# Patient Record
Sex: Male | Born: 1965 | Race: White | Hispanic: No | Marital: Single | Smoking: Light tobacco smoker
Health system: Southern US, Community
[De-identification: ages and names within clinical notes are randomized; demographics above are authoritative.]

## PROBLEM LIST (undated history)

## (undated) DIAGNOSIS — E119 Type 2 diabetes mellitus without complications: Secondary | ICD-10-CM

## (undated) DIAGNOSIS — I10 Essential (primary) hypertension: Secondary | ICD-10-CM

## (undated) NOTE — *Deleted (*Deleted)
[facility]  CONSULT NOTE    Date: 07/11/2020                  Patient Name:  [name]  MRN: [id number]  DOB: 1966-03-19  Age / Sex: 70 y.o., male         PCP: Dione Housekeeper, MD                 Service Requesting Consult: Dr. Margo Aye                 Reason for Consult: Hypercalcemia            History of Present Illness: Mr. [name] was found to have a right first toe wound. Was being treated as outpatient with dicloxacillin. Confirmed by radiology to be osteomyelitis. Started on broad spectrum antibiotics and admitted to [facility]. Nephrology was consulted for hypercalcemia   Medications: Outpatient medications: Medications Prior to Admission  Medication Sig Dispense Refill Last Dose  . atorvastatin (LIPITOR) 40 MG tablet Take 40 mg by mouth daily.   07/09/2020 at 0600  . dicloxacillin (DYNAPEN) 500 MG capsule Take 1 capsule by mouth every 6 (six) hours. For 10 days   07/09/2020 at 01800  . GLIPIZIDE XL 10 MG 24 hr tablet Take 2 tablets by mouth daily.   11 07/09/2020 at 0600  . losartan (COZAAR) 50 MG tablet Take 50 mg by mouth daily.   07/09/2020 at 0600  . amLODipine (NORVASC) 10 MG tablet Take 1 tablet (10 mg total) by mouth daily. (Patient not taking: Reported on 07/09/2020) 30 tablet 0 Not Taking at Unknown time  . amoxicillin-clavulanate (AUGMENTIN) 875-125 MG tablet Take 1 tablet by mouth 2 (two) times daily. (Patient not taking: Reported on 07/09/2020) 28 tablet 0 Not Taking at Unknown time  . ibuprofen (ADVIL,MOTRIN) 200 MG tablet Take 200-400 mg by mouth every 6 (six) hours as needed. (Patient not taking: Reported on 07/09/2020)   Not Taking at Unknown time  . lisinopril (PRINIVIL,ZESTRIL) 20 MG tablet Take 1 tablet (20 mg total) by mouth daily. (Patient not taking: Reported on 07/09/2020) 30 tablet 0 Not Taking at Unknown time  . omega-3 acid ethyl esters (LOVAZA) 1 g capsule Take 1 g by mouth every other day. (Patient not taking: Reported  on 07/09/2020)   Not Taking at Unknown time  . oxyCODONE (OXY IR/ROXICODONE) 5 MG immediate release tablet Take 1 tablet (5 mg total) by mouth every 4 (four) hours as needed for moderate pain. (Patient not taking: Reported on 07/09/2020) 30 tablet 0 Not Taking at Unknown time    Current medications: Current Facility-Administered Medications  Medication Dose Route Frequency Provider Last Rate Last Admin  . 0.9 %  sodium chloride infusion   Intravenous Continuous Dow Adolph N, DO 100 mL/hr at 07/11/20 1454 Continued from Pre-op at 07/11/20 1454  . [MAR Hold] amLODipine (NORVASC) tablet 10 mg  10 mg Oral Daily Esaw Grandchild A, DO   10 mg at 07/10/20 1702  . [MAR Hold] atorvastatin (LIPITOR) tablet 40 mg  40 mg Oral q1800 Tu, Ching T, DO   40 mg at 07/10/20 1702  . [MAR Hold] cefTRIAXone (ROCEPHIN) 2 g in sodium chloride 0.9 % 100 mL IVPB  2 g Intravenous Q24H Marty Heck, RPH 200 mL/hr at 07/11/20 0900 2 g at 07/11/20 0900  . [MAR Hold] Chlorhexidine Gluconate Cloth 2 % PADS 6 each  6 each Topical Daily Esaw Grandchild A, DO   6  each at 07/11/20 0901  . influenza vac split quadrivalent PF (FLUARIX) injection 0.5 mL  0.5 mL Intramuscular Tomorrow-1000 Pennie Banter, DO      . [MAR Hold] insulin aspart (novoLOG) injection 0-5 Units  0-5 Units Subcutaneous QHS Tu, Ching T, DO   2 Units at 07/09/20 2329  . [MAR Hold] insulin aspart (novoLOG) injection 0-9 Units  0-9 Units Subcutaneous TID WC Tu, Ching T, DO   2 Units at 07/11/20 1220  . [MAR Hold] losartan (COZAAR) tablet 50 mg  50 mg Oral Daily Tu, Ching T, DO   50 mg at 07/10/20 1009  . [MAR Hold] oxyCODONE (Oxy IR/ROXICODONE) immediate release tablet 5 mg  5 mg Oral Q4H PRN Esaw Grandchild A, DO   5 mg at 07/10/20 0813  . povidone-iodine 10 % swab 2 application  2 application Topical Once Gwyneth Revels, DPM      . [MAR Hold] vancomycin (VANCOCIN) IVPB 1000 mg/200 mL premix  1,000 mg Intravenous Q12H Valrie Hart A, RPH 200 mL/hr at 07/11/20  0954 1,000 mg at 07/11/20 4540      Allergies: No Known Allergies    Past Medical History: Past Medical History:  Diagnosis Date  . Diabetes (HCC)   . Hypertension      Past Surgical History: Past Surgical History:  Procedure Laterality Date  . IRRIGATION AND DEBRIDEMENT ABSCESS Right 10/31/2018   Procedure: IRRIGATION AND DEBRIDEMENT ABSCESS;  Surgeon: Gwyneth Revels, DPM;  Location: [facility] ORS;  Service: Podiatry;  Laterality: Right;  . IRRIGATION AND DEBRIDEMENT FOOT Right 11/02/2018   Procedure: IRRIGATION AND DEBRIDEMENT FOOT;  Surgeon: Linus Galas, DPM;  Location: [facility] ORS;  Service: Podiatry;  Laterality: Right;     Family History: Family History  Problem Relation Age of Onset  . CAD Father   . CAD Brother      Social History: Social History   Socioeconomic History  . Marital status: Single    Spouse name: Not on file  . Number of children: Not on file  . Years of education: Not on file  . Highest education level: Not on file  Occupational History  . Not on file  Tobacco Use  . Smoking status: Light Tobacco Smoker    Types: Cigars  . Smokeless tobacco: Never Used  Vaping Use  . Vaping Use: Never used  Substance and Sexual Activity  . Alcohol use: Yes    Comment: "too much"   . Drug use: Never  . Sexual activity: Yes  Other Topics Concern  . Not on file  Social History Narrative  . Not on file   Social Determinants of Health   Financial Resource Strain:   . Difficulty of Paying Living Expenses: Not on file  Food Insecurity:   . Worried About Programme researcher, broadcasting/film/video in the Last Year: Not on file  . Ran Out of Food in the Last Year: Not on file  Transportation Needs:   . Lack of Transportation (Medical): Not on file  . Lack of Transportation (Non-Medical): Not on file  Physical Activity:   . Days of Exercise per Week: Not on file  . Minutes of Exercise per Session: Not on file  Stress:   . Feeling of Stress : Not on file  Social Connections:   .  Frequency of Communication with Friends and Family: Not on file  . Frequency of Social Gatherings with Friends and Family: Not on file  . Attends Religious Services: Not on file  . Active Member  of Clubs or Organizations: Not on file  . Attends Banker Meetings: Not on file  . Marital Status: Not on file  Intimate Partner Violence:   . Fear of Current or Ex-Partner: Not on file  . Emotionally Abused: Not on file  . Physically Abused: Not on file  . Sexually Abused: Not on file     Review of Systems: ROS  Vital Signs: Blood pressure (!) 162/93, pulse 74, temperature 98.5 F (36.9 C), temperature source Oral, resp. rate 16, height 6\' 3"  (1.905 m), weight 111.1 kg, SpO2 100 %.  Weight trends: Filed Weights   07/09/20 1248  Weight: 111.1 kg    Physical Exam: General: NAD,   Head: Normocephalic, atraumatic. Moist oral mucosal membranes  Eyes: Anicteric, PERRL  Neck: Supple, trachea midline  Lungs:  Clear to auscultation  Heart: Regular rate and rhythm  Abdomen:  Soft, nontender,   Extremities:  *** peripheral edema.  Neurologic: Nonfocal, moving all four extremities  Skin: No lesions  Access: ***     Lab results: Basic Metabolic Panel: Recent Labs  Lab 07/09/20 1258 07/10/20 0534 07/11/20 0524  NA 137 134* 133*  K 4.0 4.1 5.0  CL 103 101 100  CO2 25 25 27   GLUCOSE 182* 141* 231*  BUN 10 9 10   CREATININE 0.73 0.71 0.84  CALCIUM 11.1* 11.4* 11.8*    Liver Function Tests: Recent Labs  Lab 07/09/20 1258  AST 23  ALT 23  ALKPHOS 96  BILITOT 1.1  PROT 7.5  ALBUMIN 3.8   No results for input(s): LIPASE, AMYLASE in the last 168 hours. No results for input(s): AMMONIA in the last 168 hours.  CBC: Recent Labs  Lab 07/09/20 1258 07/10/20 0534  WBC 6.1 5.6  NEUTROABS 4.4  --   HGB 14.8 14.7  HCT 43.1 43.6  MCV 88.1 86.9  PLT 257 226    Cardiac Enzymes: No results for input(s): CKTOTAL, CKMB, CKMBINDEX, TROPONINI in the last 168 hours.   BNP: Invalid input(s): POCBNP  CBG: Recent Labs  Lab 07/10/20 1325 07/10/20 1631 07/10/20 2135 07/11/20 0724 07/11/20 1140  GLUCAP 261* 166* 187* 212* 184*    Microbiology: Results for orders placed or performed during the hospital encounter of 07/09/20  Respiratory Panel by RT PCR (Flu A&B, Covid) - Nasopharyngeal Swab     Status: None   Collection Time: 07/09/20  9:28 PM   Specimen: Nasopharyngeal Swab  Result Value Ref Range Status   SARS Coronavirus 2 by RT PCR NEGATIVE NEGATIVE Final    Comment: (NOTE) SARS-CoV-2 target nucleic acids are NOT DETECTED.  The SARS-CoV-2 RNA is generally detectable in upper respiratoy specimens during the acute phase of infection. The lowest concentration of SARS-CoV-2 viral copies this assay can detect is 131 copies/mL. A negative result does not preclude SARS-Cov-2 infection and should not be used as the sole basis for treatment or other patient management decisions. A negative result may occur with  improper specimen collection/handling, submission of specimen other than nasopharyngeal swab, presence of viral mutation(s) within the areas targeted by this assay, and inadequate number of viral copies (<131 copies/mL). A negative result must be combined with clinical observations, patient history, and epidemiological information. The expected result is Negative.  Fact Sheet for Patients:  https://www.moore.com/  Fact Sheet for Healthcare Providers:  https://www.young.biz/  This test is no t yet approved or cleared by the [place] FDA and  has been authorized for detection and/or diagnosis of SARS-CoV-2 by FDA under  an Emergency Use Authorization (EUA). This EUA will remain  in effect (meaning this test can be used) for the duration of the COVID-19 declaration under Section 564(b)(1) of the Act, 21 U.S.C. section 360bbb-3(b)(1), unless the authorization is terminated or revoked sooner.      Influenza A by PCR NEGATIVE NEGATIVE Final   Influenza B by PCR NEGATIVE NEGATIVE Final    Comment: (NOTE) The Xpert Xpress SARS-CoV-2/FLU/RSV assay is intended as an aid in  the diagnosis of influenza from Nasopharyngeal swab specimens and  should not be used as a sole basis for treatment. Nasal washings and  aspirates are unacceptable for Xpert Xpress SARS-CoV-2/FLU/RSV  testing.  Fact Sheet for Patients: https://www.moore.com/  Fact Sheet for Healthcare Providers: https://www.young.biz/  This test is not yet approved or cleared by the [place] FDA and  has been authorized for detection and/or diagnosis of SARS-CoV-2 by  FDA under an Emergency Use Authorization (EUA). This EUA will remain  in effect (meaning this test can be used) for the duration of the  Covid-19 declaration under Section 564(b)(1) of the Act, 21  U.S.C. section 360bbb-3(b)(1), unless the authorization is  terminated or revoked. Performed at [facility], [address], [place], [place] [address]   Surgical pcr screen     Status: None   Collection Time: 07/10/20  4:20 PM   Specimen: Nasal Mucosa; Nasal Swab  Result Value Ref Range Status   MRSA, PCR NEGATIVE NEGATIVE Final   Staphylococcus aureus NEGATIVE NEGATIVE Final    Comment: (NOTE) The Xpert SA Assay (FDA approved for NASAL specimens in patients 75 years of age and older), is one component of a comprehensive surveillance program. It is not intended to diagnose infection nor to guide or monitor treatment. Performed at [facility], [address] [place], [place] [address]     Coagulation Studies: No results for input(s): LABPROT, INR in the last 72 hours.  Urinalysis: Recent Labs    07/09/20 2155  COLORURINE YELLOW*  LABSPEC 1.017  PHURINE 6.0  GLUCOSEU 150*  HGBUR SMALL*  BILIRUBINUR NEGATIVE  KETONESUR NEGATIVE  PROTEINUR NEGATIVE  NITRITE NEGATIVE  LEUKOCYTESUR  NEGATIVE      Imaging: MR FOOT RIGHT W WO CONTRAST  Result Date: 07/10/2020 CLINICAL DATA:  Skin ulceration on the plantar surface of the right great toe in a diabetic patient. Question osteomyelitis. EXAM: MRI OF THE RIGHT FOREFOOT WITHOUT AND WITH CONTRAST TECHNIQUE: Multiplanar, multisequence MR imaging of the right forefoot was performed before and after the administration of intravenous contrast. CONTRAST:  10 mL GADAVIST IV SOLN COMPARISON:  Plain films right great toe 07/05/2020. FINDINGS: Bones/Joint/Cartilage There is intense marrow edema and enhancement throughout the great toe consistent with osteomyelitis. Bony destructive change is seen about the IP joint and there is a small IP joint effusion. No other evidence of osteomyelitis is identified. No other evidence of osteomyelitis or septic joint is identified. The head of the proximal phalanx of the great toe appears exposed to air. 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 There is also midfoot osteoarthritis which appears mild-to-moderate in degree and worst at the second tarsometatarsal joint. Ligaments Intact. Muscles and Tendons There is atrophy of intrinsic musculature the foot. No intramuscular fluid collection. No tendon tear. Soft tissues Bandaging is present about the great toe. There is soft tissue edema and enhancement in the great toe consistent with cellulitis. IMPRESSION: Osteomyelitis throughout the great toe with bony destructive change about the IP joint compatible  with septic joint. Negative for myositis or abscess. First MTP and midfoot osteoarthritis. Electronically Signed   By: Drusilla Kanner M.D.   On: 07/10/2020 07:12   DG MINI C-ARM IMAGE ONLY  Result Date: 07/11/2020 There is no interpretation for this exam.  This order is for images obtained during a surgical procedure.  Please See "Surgeries" Tab for more information regarding the procedure.      Assessment & Plan: Mr. Duvan Mousel is a 24 y.o. white male with hypertension, diabetes mellitus type II, diabetic neuropathy, hyperlipidemia, peripheral vascular disease, history of amputation of right fifth toe who was admitted to [facility] on 07/09/2020 for Osteomyelitis [facility]) [M86.9] Osteomyelitis of right foot, unspecified type [facility]) [M86.9]  Nephrology consulted for hypercalcemia  1. Hypercalcemia: long standing since 2019. Elevated PTH at 80 with normal kidney function.  Differential includes primary hyperparathyroidism, multiple myeloma or other malignancy, milk-alkali syndrome,  granulomatous disease, or vitamin D intoxication.  However with elevated PTH, most likely primary hyperparathyroidism.  - Check phos, SPEP/UPEP, vitamin D level.  - Patient will need outpatient work up.   2. Hypertension: elevated. Home regimen of losartan, amlodipine, and lisinopril. Patient should not be on both an ACE-I and an ARB at the same time.  - Agree with continuation of amlodipine and losartan.   3. 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 Hemoglobin AA1c of 6.7% on admission.     LOS: 2 Sarath Kolluru 10/13/20212:55 PM

---

## 2017-07-26 ENCOUNTER — Emergency Department: Payer: BC Managed Care – PPO

## 2017-07-26 ENCOUNTER — Emergency Department
Admission: EM | Admit: 2017-07-26 | Discharge: 2017-07-26 | Disposition: A | Discharge status: Home / Self Care | Payer: BC Managed Care – PPO | Attending: Emergency Medicine | Admitting: Emergency Medicine

## 2017-07-26 DIAGNOSIS — Y999 Unspecified external cause status: Secondary | ICD-10-CM | POA: Diagnosis not present

## 2017-07-26 DIAGNOSIS — S161XXA Strain of muscle, fascia and tendon at neck level, initial encounter: Secondary | ICD-10-CM

## 2017-07-26 DIAGNOSIS — Y939 Activity, unspecified: Secondary | ICD-10-CM | POA: Diagnosis not present

## 2017-07-26 DIAGNOSIS — Y929 Unspecified place or not applicable: Secondary | ICD-10-CM | POA: Diagnosis not present

## 2017-07-26 DIAGNOSIS — M62838 Other muscle spasm: Secondary | ICD-10-CM | POA: Diagnosis not present

## 2017-07-26 DIAGNOSIS — S199XXA Unspecified injury of neck, initial encounter: Secondary | ICD-10-CM | POA: Diagnosis present

## 2017-07-26 MED ORDER — KETOROLAC TROMETHAMINE 30 MG/ML IJ SOLN
30.0000 mg | Freq: Once | INTRAMUSCULAR | Status: AC
  Administered 2017-07-26: 30 mg via INTRAMUSCULAR
  Filled 2017-07-26: qty 1

## 2017-07-26 MED ORDER — CYCLOBENZAPRINE HCL 10 MG PO TABS: 10.0000 mg | ORAL_TABLET | Freq: Three times a day (TID) | ORAL | 0 refills | Status: DC | PRN

## 2017-07-26 MED ORDER — CYCLOBENZAPRINE HCL 10 MG PO TABS
5.0000 mg | ORAL_TABLET | Freq: Once | ORAL | Status: AC
  Administered 2017-07-26: 5 mg via ORAL
  Filled 2017-07-26: qty 1

## 2017-07-26 MED ORDER — KETOROLAC TROMETHAMINE 10 MG PO TABS: 10.0000 mg | ORAL_TABLET | Freq: Four times a day (QID) | ORAL | 0 refills | Status: AC | PRN
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
## 2017-07-26 NOTE — Discharge Instructions (Signed)
Take medication as prescribed.   X-rays taken were negative for acute fracture (broken bones).   Return to emergency department if symptoms significantly worsen or follow-up with PCP as needed.

## 2018-01-19 ENCOUNTER — Other Ambulatory Visit: Payer: Self-pay | Admitting: Pediatrics

## 2018-01-19 DIAGNOSIS — R030 Elevated blood-pressure reading, without diagnosis of hypertension: Secondary | ICD-10-CM

## 2018-01-19 DIAGNOSIS — Z8249 Family history of ischemic heart disease and other diseases of the circulatory system: Secondary | ICD-10-CM

## 2018-02-09 ENCOUNTER — Other Ambulatory Visit: Payer: Self-pay | Admitting: Pediatrics

## 2018-02-12 ENCOUNTER — Other Ambulatory Visit: Payer: Self-pay | Admitting: Pediatrics

## 2018-02-12 DIAGNOSIS — R748 Abnormal levels of other serum enzymes: Secondary | ICD-10-CM

## 2018-02-12 DIAGNOSIS — R030 Elevated blood-pressure reading, without diagnosis of hypertension: Secondary | ICD-10-CM

## 2018-02-12 DIAGNOSIS — Z8249 Family history of ischemic heart disease and other diseases of the circulatory system: Secondary | ICD-10-CM

## 2018-02-15 ENCOUNTER — Other Ambulatory Visit: Payer: Self-pay | Admitting: Pediatrics

## 2018-02-15 DIAGNOSIS — R748 Abnormal levels of other serum enzymes: Secondary | ICD-10-CM

## 2018-02-15 DIAGNOSIS — R030 Elevated blood-pressure reading, without diagnosis of hypertension: Secondary | ICD-10-CM

## 2018-02-15 DIAGNOSIS — Z8249 Family history of ischemic heart disease and other diseases of the circulatory system: Secondary | ICD-10-CM

## 2018-02-16 ENCOUNTER — Ambulatory Visit: Payer: BC Managed Care – PPO

## 2018-02-16 ENCOUNTER — Ambulatory Visit
Admission: RE | Admit: 2018-02-16 | Discharge: 2018-02-16 | Disposition: A | Discharge status: Home / Self Care | Payer: BC Managed Care – PPO | Source: Ambulatory Visit | Attending: Pediatrics | Admitting: Pediatrics

## 2018-02-16 ENCOUNTER — Encounter (INDEPENDENT_AMBULATORY_CARE_PROVIDER_SITE_OTHER): Payer: Self-pay

## 2018-02-16 DIAGNOSIS — R748 Abnormal levels of other serum enzymes: Secondary | ICD-10-CM

## 2018-02-16 DIAGNOSIS — R030 Elevated blood-pressure reading, without diagnosis of hypertension: Secondary | ICD-10-CM | POA: Insufficient documentation

## 2018-02-16 DIAGNOSIS — Z8249 Family history of ischemic heart disease and other diseases of the circulatory system: Secondary | ICD-10-CM | POA: Insufficient documentation

## 2018-04-13 ENCOUNTER — Other Ambulatory Visit: Payer: Self-pay

## 2018-04-13 ENCOUNTER — Encounter: Payer: Self-pay | Admitting: Emergency Medicine

## 2018-04-13 ENCOUNTER — Observation Stay
Admission: EM | Admit: 2018-04-13 | Discharge: 2018-04-14 | Disposition: A | Discharge status: Home / Self Care | Payer: BC Managed Care – PPO | Attending: Internal Medicine | Admitting: Internal Medicine

## 2018-04-13 DIAGNOSIS — Z7984 Long term (current) use of oral hypoglycemic drugs: Secondary | ICD-10-CM | POA: Insufficient documentation

## 2018-04-13 DIAGNOSIS — Z8249 Family history of ischemic heart disease and other diseases of the circulatory system: Secondary | ICD-10-CM | POA: Insufficient documentation

## 2018-04-13 DIAGNOSIS — Y93K9 Activity, other involving animal care: Secondary | ICD-10-CM | POA: Insufficient documentation

## 2018-04-13 DIAGNOSIS — L03113 Cellulitis of right upper limb: Principal | ICD-10-CM | POA: Insufficient documentation

## 2018-04-13 DIAGNOSIS — F17290 Nicotine dependence, other tobacco product, uncomplicated: Secondary | ICD-10-CM | POA: Insufficient documentation

## 2018-04-13 DIAGNOSIS — Y998 Other external cause status: Secondary | ICD-10-CM | POA: Insufficient documentation

## 2018-04-13 DIAGNOSIS — Z79899 Other long term (current) drug therapy: Secondary | ICD-10-CM | POA: Diagnosis not present

## 2018-04-13 DIAGNOSIS — W540XXA Bitten by dog, initial encounter: Secondary | ICD-10-CM | POA: Diagnosis not present

## 2018-04-13 DIAGNOSIS — Y92009 Unspecified place in unspecified non-institutional (private) residence as the place of occurrence of the external cause: Secondary | ICD-10-CM | POA: Insufficient documentation

## 2018-04-13 DIAGNOSIS — I10 Essential (primary) hypertension: Secondary | ICD-10-CM | POA: Insufficient documentation

## 2018-04-13 DIAGNOSIS — E119 Type 2 diabetes mellitus without complications: Secondary | ICD-10-CM | POA: Insufficient documentation

## 2018-04-13 HISTORY — DX: Essential (primary) hypertension: I10

## 2018-04-13 HISTORY — DX: Type 2 diabetes mellitus without complications: E11.9

## 2018-04-13 LAB — CBC WITH DIFFERENTIAL/PLATELET
Basophils Absolute: 0 10*3/uL (ref 0–0.1)
Basophils Relative: 0 %
EOS ABS: 0 10*3/uL (ref 0–0.7)
Eosinophils Relative: 0 %
HEMATOCRIT: 47.9 % (ref 40.0–52.0)
HEMOGLOBIN: 16.9 g/dL (ref 13.0–18.0)
LYMPHS ABS: 1.3 10*3/uL (ref 1.0–3.6)
Lymphocytes Relative: 13 %
MCH: 32.9 pg (ref 26.0–34.0)
MCHC: 35.4 g/dL (ref 32.0–36.0)
MCV: 92.9 fL (ref 80.0–100.0)
Monocytes Absolute: 1.3 10*3/uL — ABNORMAL HIGH (ref 0.2–1.0)
Monocytes Relative: 13 %
NEUTROS ABS: 7.9 10*3/uL — AB (ref 1.4–6.5)
NEUTROS PCT: 74 %
Platelets: 144 10*3/uL — ABNORMAL LOW (ref 150–440)
RBC: 5.15 MIL/uL (ref 4.40–5.90)
RDW: 13.1 % (ref 11.5–14.5)
WBC: 10.6 10*3/uL (ref 3.8–10.6)

## 2018-04-13 LAB — BASIC METABOLIC PANEL
ANION GAP: 8 (ref 5–15)
BUN: 12 mg/dL (ref 6–20)
CHLORIDE: 102 mmol/L (ref 98–111)
CO2: 26 mmol/L (ref 22–32)
Calcium: 11.2 mg/dL — ABNORMAL HIGH (ref 8.9–10.3)
Creatinine, Ser: 0.61 mg/dL (ref 0.61–1.24)
GFR calc Af Amer: >60 mL/min (ref >60)
GLUCOSE: 168 mg/dL — AB (ref 70–99)
POTASSIUM: 3.9 mmol/L (ref 3.5–5.1)
Sodium: 136 mmol/L (ref 135–145)

## 2018-04-13 LAB — GLUCOSE, CAPILLARY
Glucose-Capillary: 158 mg/dL — ABNORMAL HIGH (ref 70–99)
Glucose-Capillary: 148 mg/dL — ABNORMAL HIGH (ref 70–99)

## 2018-04-13 MED ORDER — ENOXAPARIN SODIUM 40 MG/0.4ML ~~LOC~~ SOLN
40.0000 mg | SUBCUTANEOUS | Status: DC
  Administered 2018-04-13: 40 mg via SUBCUTANEOUS
  Filled 2018-04-13: qty 0.4

## 2018-04-13 MED ORDER — ONDANSETRON HCL 4 MG PO TABS: 4.0000 mg | ORAL_TABLET | Freq: Four times a day (QID) | ORAL | Status: DC | PRN

## 2018-04-13 MED ORDER — SODIUM CHLORIDE 0.9 % IV SOLN
3.0000 g | Freq: Four times a day (QID) | INTRAVENOUS | Status: DC
  Administered 2018-04-13 – 2018-04-14 (×3): 3 g via INTRAVENOUS
  Filled 2018-04-13 (×5): qty 3

## 2018-04-13 MED ORDER — OXYCODONE-ACETAMINOPHEN 5-325 MG PO TABS
1.0000 | ORAL_TABLET | Freq: Four times a day (QID) | ORAL | Status: DC | PRN
  Administered 2018-04-13 – 2018-04-14 (×3): 1 via ORAL
  Filled 2018-04-13 (×3): qty 1

## 2018-04-13 MED ORDER — IBUPROFEN 400 MG PO TABS: 400.0000 mg | ORAL_TABLET | Freq: Four times a day (QID) | ORAL | Status: DC | PRN

## 2018-04-13 MED ORDER — MORPHINE SULFATE (PF) 4 MG/ML IV SOLN
4.0000 mg | Freq: Once | INTRAVENOUS | Status: AC
  Administered 2018-04-13: 4 mg via INTRAVENOUS
  Filled 2018-04-13: qty 1

## 2018-04-13 MED ORDER — ACETAMINOPHEN 650 MG RE SUPP: 650.0000 mg | Freq: Four times a day (QID) | RECTAL | Status: DC | PRN

## 2018-04-13 MED ORDER — ONDANSETRON HCL 4 MG/2ML IJ SOLN
4.0000 mg | Freq: Once | INTRAMUSCULAR | Status: AC
  Administered 2018-04-13: 4 mg via INTRAVENOUS
  Filled 2018-04-13: qty 2

## 2018-04-13 MED ORDER — ACETAMINOPHEN 325 MG PO TABS: 650.0000 mg | ORAL_TABLET | Freq: Four times a day (QID) | ORAL | Status: DC | PRN

## 2018-04-13 MED ORDER — POLYETHYLENE GLYCOL 3350 17 G PO PACK: 17.0000 g | PACK | Freq: Every day | ORAL | Status: DC | PRN

## 2018-04-13 MED ORDER — AMPICILLIN-SULBACTAM SODIUM 3 (2-1) G IJ SOLR
3.0000 g | Freq: Once | INTRAMUSCULAR | Status: AC
  Administered 2018-04-13: 3 g via INTRAVENOUS
  Filled 2018-04-13: qty 3

## 2018-04-13 MED ORDER — INSULIN ASPART 100 UNIT/ML ~~LOC~~ SOLN
0.0000 [IU] | Freq: Every day | SUBCUTANEOUS | Status: DC
  Administered 2018-04-14: 2 [IU] via SUBCUTANEOUS

## 2018-04-13 MED ORDER — INSULIN ASPART 100 UNIT/ML ~~LOC~~ SOLN
0.0000 [IU] | Freq: Three times a day (TID) | SUBCUTANEOUS | Status: DC
  Filled 2018-04-13: qty 1

## 2018-04-13 MED ORDER — HYDRALAZINE HCL 20 MG/ML IJ SOLN: 10.0000 mg | Freq: Four times a day (QID) | INTRAMUSCULAR | Status: DC | PRN

## 2018-04-13 MED ORDER — ONDANSETRON HCL 4 MG/2ML IJ SOLN: 4.0000 mg | Freq: Four times a day (QID) | INTRAMUSCULAR | Status: DC | PRN

## 2018-04-13 NOTE — ED Notes (Signed)
1 set blood cultures drawn with Kurin PIV set with IV start d/t infection in right hand s/p dog bite. Pt prescribed antibiotics but no blood cultures ordered at this time. This RN asked EDP if he wanted them, declined at this time. Set sent with save label prior to antibiotic administration. 1531 was time of collection. 18g left forearm site.

## 2018-04-13 NOTE — ED Triage Notes (Signed)
Pt reports that he moved his dogs food bowl while he was eating and the dog bit his right hand. Right hand red and swollen.

## 2018-04-13 NOTE — Progress Notes (Signed)
Pharmacy Antibiotic Note  Westin Knotts is a 52 y.o. male admitted on 04/13/2018 with cellulitis following a dog bite.  Pharmacy has been consulted for Unaysn dosing.  Plan: Unasyn 3g IV q6h  Height: 6\' 3"  (190.5 cm) Weight: 220 lb (99.8 kg) IBW/kg (Calculated) : 84.5  Temp (24hrs), Avg:99.5 F (37.5 C), Min:98.8 F (37.1 C), Max:100.2 F (37.9 C)  Recent Labs  Lab 04/13/18 1531  WBC 10.6  CREATININE 0.61    Estimated Creatinine Clearance: 129.1 mL/min (by C-G formula based on SCr of 0.61 mg/dL).    No Known Allergies  Antimicrobials this admission: Unasyn 7/16 >>   Thank you for allowing pharmacy to be a part of this patient's care.  Paulina Fusi, PharmD, BCPS 04/13/2018 6:18 PM

## 2018-04-13 NOTE — ED Notes (Signed)
ED Provider at bedside. 

## 2018-04-13 NOTE — ED Provider Notes (Signed)
[facility] Emergency Department Provider Note ___________________________________________   First MD Initiated Contact with Patient 04/13/18 1430     (approximate)  I have reviewed the triage vital signs and the nursing notes.   HISTORY  Chief Complaint Animal Bite  HPI [name] is a 52 y.o. male with a history of hypertension as well as diabetes was presenting to the emergency department with right upper extremity swelling and pain after his dog bit him yesterday morning.  He says that his dog is up-to-date with his vaccinations and was acting normally.  The patient says that he was moving the dog's food and the dog is known to be territorial with his food.  The dog bit his right hand at that time and the patient says that the right hand as well as right upper extremity have swollen greatly since 3 AM.  He denies fever.  Says that his tetanus shot was updated this past April.  Dog is been acting normally since the incident.  Patient states that since the swelling started at 3 AM his hand is swollen and he has streaking redness up his right upper extremity.   Past Medical History:  Diagnosis Date  . Hypertension     There are no active problems to display for this patient.   History reviewed. No pertinent surgical history.  Prior to Admission medications   Medication Sig Start Date End Date Taking? Authorizing Provider  GLIPIZIDE XL 5 MG 24 hr tablet Take 1 tablet by mouth daily. 02/08/18  Yes [provider]  lisinopril (PRINIVIL,ZESTRIL) 10 MG tablet Take 1 tablet by mouth daily. 02/08/18  Yes [provider]  cyclobenzaprine (FLEXERIL) 10 MG tablet Take 1 tablet (10 mg total) by mouth 3 (three) times daily as needed for muscle spasms. Patient not taking: Reported on 04/13/2018 07/26/17   Little, Traci M, PA-C    Allergies Patient has no known allergies.  No family history on file.  Social History Social History   Tobacco Use    . Smoking status: Light Tobacco Smoker    Types: Cigars  Substance Use Topics  . Alcohol use: Yes  . Drug use: Never    Review of Systems  Constitutional: No fever/chills Eyes: No visual changes. ENT: No sore throat. Cardiovascular: Denies chest pain. Respiratory: Denies shortness of breath. Gastrointestinal: No abdominal pain.  No nausea, no vomiting.  No diarrhea.  No constipation. Genitourinary: Negative for dysuria. Musculoskeletal: Negative for back pain. Skin: As above Neurological: Negative for headaches, focal weakness or numbness.   ____________________________________________   PHYSICAL EXAM:  VITAL SIGNS: ED Triage Vitals [04/13/18 1259]  Enc Vitals Group     BP (!) 178/95     Pulse Rate 100     Resp 20     Temp 98.8 F (37.1 C)     Temp Source Oral     SpO2 98 %     Weight 220 lb (99.8 kg)     Height 6\' 3"  (1.905 m)     Head Circumference      Peak Flow      Pain Score 7     Pain Loc      Pain Edu?      Excl. in [place]?     Constitutional: Alert and oriented. Well appearing and in no acute distress. Eyes: Conjunctivae are normal.  Head: Atraumatic. Nose: No congestion/rhinnorhea. Mouth/Throat: Mucous membranes are moist.  Neck: No stridor.   Cardiovascular: Normal rate, regular rhythm. Grossly  normal heart sounds.  Palpable pulse to the right radial. Respiratory: Normal respiratory effort.  No retractions. Lungs CTAB. Gastrointestinal: Soft and nontender. No distention. No CVA tenderness. Musculoskeletal: No lower extremity tenderness nor edema.  No joint effusions.  Right hand is diffusely swollen.  Patient unable to make a fist because of the pain and swelling obstructing him flexing his fingers.  There are puncture wounds visualized on the dorsum, medial as well as palmar aspects.  No fluctuance.  No bony deformity.  Streaking redness of the right upper extremity all the way to the elbow to the volar and dorsal surface of the right  forearm.  Neurologic:  Normal speech and language. No gross focal neurologic deficits are appreciated. Skin:  Skin is warm, dry and intact. No rash noted. Psychiatric: Mood and affect are normal. Speech and behavior are normal.  ____________________________________________   LABS (all labs ordered are listed, but only abnormal results are displayed)  Labs Reviewed  CBC WITH DIFFERENTIAL/PLATELET - Abnormal; Notable for the following components:      Result Value   Platelets 144 (*)    Neutro Abs 7.9 (*)    Monocytes Absolute 1.3 (*)    All other components within normal limits  BASIC METABOLIC PANEL - Abnormal; Notable for the following components:   Glucose, Bld 168 (*)    Calcium 11.2 (*)    All other components within normal limits   ____________________________________________  EKG   ____________________________________________  RADIOLOGY   ____________________________________________   PROCEDURES  Procedure(s) performed:   Procedures  Critical Care performed:   ____________________________________________   INITIAL IMPRESSION / ASSESSMENT AND PLAN / ED COURSE  Pertinent labs & imaging results that were available during my care of the patient were reviewed by me and considered in my medical decision making (see chart for details).  DDX: Wound infection, animal bite, cellulitis As part of my medical decision making, I reviewed the following data within the electronic MEDICAL RECORD NUMBER Notes from prior ED visits  ----------------------------------------- 4:05 PM on 04/13/2018 -----------------------------------------  Patient with wound infection after animal bite.  Will be admitted to the hospital.  Signed out to Dr. Darvin Neighbours.  Patient low risk for rabies.  Known dog that is been acting normally.  Provoked bite.   ____________________________________________   FINAL CLINICAL IMPRESSION(S) / ED DIAGNOSES  Dog bite.  Right upper extremity  cellulitis.   NEW MEDICATIONS STARTED DURING THIS VISIT:  New Prescriptions   No medications on file     Note:  This document was prepared using Dragon voice recognition software and may include unintentional dictation errors.     Orbie Pyo, MD 04/13/18 [phone]

## 2018-04-13 NOTE — ED Notes (Signed)
Pt reports being up-to-date on tetanus shot (recently, within last month or two). States dog is up-to-date on all shots, but does think he hasn't been to the vet with the dog for 3 years. Three puncture wounds to right hand with a large amount of swelling present in right hand and fingers. Redness tracking up arm, anteriorly and posteriorly. Pain 8/10 tightness and aching at this time.

## 2018-04-13 NOTE — H&P (Signed)
[facility] [facility] [place] NAME: [name]    MR#:  [id number]  DATE OF BIRTH:  [birth year]  DATE OF ADMISSION:  04/13/2018  PRIMARY CARE PHYSICIAN: Derinda Late, MD   REQUESTING/REFERRING PHYSICIAN:   CHIEF COMPLAINT:   Chief Complaint  Patient presents with  . Animal Bite    HISTORY OF PRESENT ILLNESS:  [name]  is a 52 y.o. male with a known history of hypertension, diabetes, hyperlipidemia presents to the hospital after he had a dog bite yesterday evening on his right hand.  Redness quickly spread and is on his forearm.  Presented to the emergency room.  Afebrile and normal WBC but due to extensive nature of the cellulitis is being admitted to the hospital.  No pus.  Patient had a tetanus shot April 2019.  His dog is up-to-date on its vaccinations.  PAST MEDICAL HISTORY:   Past Medical History:  Diagnosis Date  . Hypertension     PAST SURGICAL HISTORY:  History reviewed. No pertinent surgical history.  SOCIAL HISTORY:   Social History   Tobacco Use  . Smoking status: Light Tobacco Smoker    Types: Cigars  Substance Use Topics  . Alcohol use: Yes    FAMILY HISTORY:   Family History  Problem Relation Age of Onset  . CAD Father   . CAD Brother     DRUG ALLERGIES:  No Known Allergies  REVIEW OF SYSTEMS:   Review of Systems  Constitutional: Positive for chills and malaise/fatigue. Negative for fever and weight loss.  HENT: Negative for hearing loss and nosebleeds.   Eyes: Negative for blurred vision, double vision and pain.  Respiratory: Negative for cough, hemoptysis, sputum production, shortness of breath and wheezing.   Cardiovascular: Negative for chest pain, palpitations, orthopnea and leg swelling.  Gastrointestinal: Negative for abdominal pain, constipation, diarrhea, nausea and vomiting.  Genitourinary: Negative for dysuria and hematuria.  Musculoskeletal: Positive for joint pain. Negative for back pain,  falls and myalgias.  Skin: Negative for rash.  Neurological: Negative for dizziness, tremors, sensory change, speech change, focal weakness, seizures and headaches.  Endo/Heme/Allergies: Does not bruise/bleed easily.  Psychiatric/Behavioral: Negative for depression and memory loss. The patient is not nervous/anxious.     MEDICATIONS AT HOME:   Prior to Admission medications   Medication Sig Start Date End Date Taking? Authorizing Provider  GLIPIZIDE XL 5 MG 24 hr tablet Take 1 tablet by mouth daily. 02/08/18  Yes [provider]  lisinopril (PRINIVIL,ZESTRIL) 10 MG tablet Take 1 tablet by mouth daily. 02/08/18  Yes [provider]  cyclobenzaprine (FLEXERIL) 10 MG tablet Take 1 tablet (10 mg total) by mouth 3 (three) times daily as needed for muscle spasms. Patient not taking: Reported on 04/13/2018 07/26/17   Little, Traci M, PA-C     VITAL SIGNS:  Blood pressure (!) 156/87, pulse 88, temperature 98.8 F (37.1 C), temperature source Oral, resp. rate 16, height 6\' 3"  (1.905 m), weight 99.8 kg (220 lb), SpO2 97 %.  PHYSICAL EXAMINATION:  Physical Exam  GENERAL:  52 y.o.-year-old patient lying in the bed with no acute distress.  EYES: Pupils equal, round, reactive to light and accommodation. No scleral icterus. Extraocular muscles intact.  HEENT: Head atraumatic, normocephalic. Oropharynx and nasopharynx clear. No oropharyngeal erythema, moist oral mucosa  NECK:  Supple, no jugular venous distention. No thyroid enlargement, no tenderness.  LUNGS: Normal breath sounds bilaterally, no wheezing, rales, rhonchi. No use of accessory muscles of respiration.  CARDIOVASCULAR: S1, S2 normal. No murmurs, rubs, or gallops.  ABDOMEN: Soft, nontender, nondistended. Bowel sounds present. No organomegaly or mass.  EXTREMITIES: No pedal edema, cyanosis, or clubbing. + 2 pedal & radial pulses b/l.   NEUROLOGIC: Cranial nerves II through XII are intact. No focal Motor or sensory deficits  appreciated b/l PSYCHIATRIC: The patient is alert and oriented x 3. Good affect.  SKIN: 2 dog bite marks with no discharge dorsally and ventrally on the hand.  Swelling, redness and warmth.  Patient able to make a fist.  Redness extending up the forearm.  LABORATORY PANEL:   CBC Recent Labs  Lab 04/13/18 1531  WBC 10.6  HGB 16.9  HCT 47.9  PLT 144*   ------------------------------------------------------------------------------------------------------------------  Chemistries  Recent Labs  Lab 04/13/18 1531  NA 136  K 3.9  CL 102  CO2 26  GLUCOSE 168*  BUN 12  CREATININE 0.61  CALCIUM 11.2*   ------------------------------------------------------------------------------------------------------------------  Cardiac Enzymes No results for input(s): TROPONINI in the last 168 hours. ------------------------------------------------------------------------------------------------------------------  RADIOLOGY:  No results found.   IMPRESSION AND PLAN:   * Right hand cellulitis secondary to dog bite.  Start IV Unasyn.  No sepsis. Will need to monitor closely for any development of abscess in the hand or tenosynovitis.  *Hypertension.  Patient was on medication in the past but stopped it due to side effects.  He does not remember what medication that was.  He is waiting to follow-up with his primary care physician on 04/24/2018.  Does not want to get started on new medication still this appointment.  Ordered IV hydralazine as needed  *Diabetes mellitus.  Sliding scale insulin added.  Diabetic diet.  Does not remember his home medication.  DVT prophylaxis with Lovenox  All the records are reviewed and case discussed with ED provider. Management plans discussed with the patient, family and they are in agreement.  CODE STATUS: Full code  TOTAL TIME TAKING CARE OF THIS PATIENT: 40 minutes.   [name] M.D on 04/13/2018 at 4:09 PM  Between 7am to 6pm - Pager -  [phone]  After 6pm go to [email or web address] - password [email or web address] [facility]  Office  [phone]  CC: Primary care physician; Derinda Late, MD  Note: This dictation was prepared with Dragon dictation along with smaller phrase technology. Any transcriptional errors that result from this process are unintentional.

## 2018-04-14 LAB — GLUCOSE, CAPILLARY
GLUCOSE-CAPILLARY: 164 mg/dL — AB (ref 70–99)
Glucose-Capillary: 162 mg/dL — ABNORMAL HIGH (ref 70–99)
Glucose-Capillary: 162 mg/dL — ABNORMAL HIGH (ref 70–99)

## 2018-04-14 LAB — HEMOGLOBIN A1C
Hgb A1c MFr Bld: 5.7 % — ABNORMAL HIGH (ref 4.8–5.6)
Mean Plasma Glucose: 116.89 mg/dL

## 2018-04-14 LAB — HIV ANTIBODY (ROUTINE TESTING W REFLEX): HIV SCREEN 4TH GENERATION: NONREACTIVE

## 2018-04-14 MED ORDER — AMOXICILLIN-POT CLAVULANATE 875-125 MG PO TABS: 1.0000 | ORAL_TABLET | Freq: Two times a day (BID) | ORAL | Status: DC

## 2018-04-14 MED ORDER — IBUPROFEN 400 MG PO TABS: 400.0000 mg | ORAL_TABLET | Freq: Three times a day (TID) | ORAL | 0 refills | Status: AC

## 2018-04-14 MED ORDER — IBUPROFEN 400 MG PO TABS: 400.0000 mg | ORAL_TABLET | Freq: Three times a day (TID) | ORAL | Status: DC

## 2018-04-14 MED ORDER — AMOXICILLIN-POT CLAVULANATE 875-125 MG PO TABS: 1.0000 | ORAL_TABLET | Freq: Two times a day (BID) | ORAL | 0 refills | Status: DC

## 2018-04-14 NOTE — Discharge Summary (Signed)
[facility] [place] NAME: [name]    MR#:  [id number]  DATE OF BIRTH:  1966/06/15  DATE OF ADMISSION:  04/13/2018 ADMITTING PHYSICIAN: Hillary Bow, MD  DATE OF DISCHARGE: 04/14/2018  PRIMARY CARE PHYSICIAN: Valera Castle, MD    ADMISSION DIAGNOSIS:  Cellulitis of right upper extremity [P95.093] Dog bite, initial encounter [phone]]  DISCHARGE DIAGNOSIS:   Right-hand cellulitis after dog bite-- improving SECONDARY DIAGNOSIS:   Past Medical History:  Diagnosis Date  . Diabetes ([place])   . Hypertension     HOSPITAL COURSE:   [name]  is a 52 y.o. male with a known history of hypertension, diabetes, hyperlipidemia presents to the hospital after he had a dog bite yesterday evening on his right hand.  Redness quickly spread and is on his forearm.  Presented to the emergency room.  Afebrile and normal WBC.  * Right hand cellulitis secondary to dog bite.  - IV Unasyn--- change to oral Augmentin.  No signs of sepsis. -Blood cultures were not sent from the emergency room -patient afebrile heart rate normal -redness improved. Some swelling present. -No soft tissue feeling suggestive of abscess -patient has good range of motion in the finger in the wrist -advised patient to take ibuprofen 400 mg three times a day with meals for two days and then as needed to help with swelling and inflammation  *Hypertension.  Patient was on medication in the past but stopped it due to side effects.  He does not remember what medication that was.  He is waiting to follow-up with his primary care physician on 04/24/2018.  Does not want to get started on new medication still this appointment.  Ordered IV hydralazine as needed  *Diabetes mellitus.  Sliding scale insulin added.  Diabetic diet.  Does not remember his home medication. -Continue glipizide. A1c is 5.7  *DVT prophylaxis with Lovenox   Overall improving. Discussed with  patient and wife. Will discharge to home. Patient advised to keep an eye on for signs symptoms of worsening infection. They voiced understanding.    CONSULTS OBTAINED:    DRUG ALLERGIES:  No Known Allergies  DISCHARGE MEDICATIONS:   Allergies as of 04/14/2018   No Known Allergies     Medication List    STOP taking these medications   cyclobenzaprine 10 MG tablet Commonly known as:  FLEXERIL     TAKE these medications   amoxicillin-clavulanate 875-125 MG tablet Commonly known as:  AUGMENTIN Take 1 tablet by mouth every 12 (twelve) hours.   GLIPIZIDE XL 5 MG 24 hr tablet Generic drug:  glipiZIDE Take 1 tablet by mouth daily.   ibuprofen 400 MG tablet Commonly known as:  ADVIL,MOTRIN Take 1 tablet (400 mg total) by mouth 3 (three) times daily for 2 days. And then as needed   lisinopril 10 MG tablet Commonly known as:  PRINIVIL,ZESTRIL Take 1 tablet by mouth daily.       If you experience worsening of your admission symptoms, develop shortness of breath, life threatening emergency, suicidal or homicidal thoughts you must seek medical attention immediately by calling 911 or calling your MD immediately  if symptoms less severe.  You Must read complete instructions/literature along with all the possible adverse reactions/side effects for all the Medicines you take and that have been prescribed to you. Take any new Medicines after you have completely understood and accept all the possible adverse reactions/side effects.   Please note  You were cared  for by a hospitalist during your hospital stay. If you have any questions about your discharge medications or the care you received while you were in the hospital after you are discharged, you can call the unit and asked to speak with the hospitalist on call if the hospitalist that took care of you is not available. Once you are discharged, your primary care physician will handle any further medical issues. Please note that NO  REFILLS for any discharge medications will be authorized once you are discharged, as it is imperative that you return to your primary care physician (or establish a relationship with a primary care physician if you do not have one) for your aftercare needs so that they can reassess your need for medications and monitor your lab values. Today   SUBJECTIVE   Overall feels ok. Redness better No drainage from bite site  VITAL SIGNS:  Blood pressure (!) 169/98, pulse 85, temperature 97.9 F (36.6 C), temperature source Oral, resp. rate 20, height 6\' 3"  (1.905 m), weight 99.8 kg (220 lb), SpO2 98 %.  I/O:    Intake/Output Summary (Last 24 hours) at 04/14/2018 1043 Last data filed at 04/13/2018 1618 Gross per 24 hour  Intake 100 ml  Output -  Net 100 ml    PHYSICAL EXAMINATION:  GENERAL:  52 y.o.-year-old patient lying in the bed with no acute distress.  EYES: Pupils equal, round, reactive to light and accommodation. No scleral icterus. Extraocular muscles intact.  HEENT: Head atraumatic, normocephalic. Oropharynx and nasopharynx clear.  NECK:  Supple, no jugular venous distention. No thyroid enlargement, no tenderness.  LUNGS: Normal breath sounds bilaterally, no wheezing, rales,rhonchi or crepitation. No use of accessory muscles of respiration.  CARDIOVASCULAR: S1, S2 normal. No murmurs, rubs, or gallops.  ABDOMEN: Soft, non-tender, non-distended. Bowel sounds present. No organomegaly or mass.  EXTREMITIES: No pedal edema, cyanosis, or clubbing. Bite mark scab+..redness improving. Swelling +. Good range of motion of the finger and wrist joint NEUROLOGIC: Cranial nerves II through XII are intact. Muscle strength 5/5 in all extremities. Sensation intact. Gait not checked.  PSYCHIATRIC: The patient is alert and oriented x 3.  SKIN: No obvious rash, lesion, or ulcer.   DATA REVIEW:   CBC  Recent Labs  Lab 04/13/18 1531  WBC 10.6  HGB 16.9  HCT 47.9  PLT 144*    Chemistries   Recent Labs  Lab 04/13/18 1531  NA 136  K 3.9  CL 102  CO2 26  GLUCOSE 168*  BUN 12  CREATININE 0.61  CALCIUM 11.2*    Microbiology Results   No results found for this or any previous visit (from the past 240 hour(s)).  RADIOLOGY:  No results found.   Management plans discussed with the patient, family and they are in agreement.  CODE STATUS:     Code Status Orders  (From admission, onward)        Start     Ordered   04/13/18 1608  Full code  Continuous     04/13/18 1607    Code Status History    This patient has a current code status but no historical code status.      TOTAL TIME TAKING CARE OF THIS PATIENT: *40* minutes.    Fritzi Mandes M.D on 04/14/2018 at 10:43 AM  Between 7am to 6pm - Pager - [phone] After 6pm go to [email or web address] - password [email or web address] [facility]  Office  [phone]  CC: Primary care physician; Valera Castle,  MD     

## 2018-04-14 NOTE — Discharge Instructions (Signed)
Patient advised to take ibuprofen round-the-clock for 1 to 2 days and then as needed keep arm elevated watch for fever, increasing redness or any drainage from the bite site. Present to the emergency room if any of the above happens.

## 2018-04-14 NOTE — Progress Notes (Signed)
Discharge instructions reviewed with patient. Prescriptions and follow up appointments reviewed. No IV present at discharge. Patient with no questions. Awaiting volunteer transport for discharge home.

## 2018-10-30 ENCOUNTER — Emergency Department: Payer: BC Managed Care – PPO

## 2018-10-30 ENCOUNTER — Encounter: Payer: Self-pay | Admitting: Emergency Medicine

## 2018-10-30 ENCOUNTER — Other Ambulatory Visit: Payer: Self-pay

## 2018-10-30 ENCOUNTER — Inpatient Hospital Stay
Admission: EM | Admit: 2018-10-30 | Discharge: 2018-11-04 | DRG: 475 | Disposition: A | Discharge status: Home Health | Payer: BC Managed Care – PPO | Attending: Internal Medicine | Admitting: Internal Medicine

## 2018-10-30 DIAGNOSIS — Z8249 Family history of ischemic heart disease and other diseases of the circulatory system: Secondary | ICD-10-CM | POA: Diagnosis not present

## 2018-10-30 DIAGNOSIS — M86271 Subacute osteomyelitis, right ankle and foot: Principal | ICD-10-CM | POA: Diagnosis present

## 2018-10-30 DIAGNOSIS — E1169 Type 2 diabetes mellitus with other specified complication: Secondary | ICD-10-CM | POA: Diagnosis present

## 2018-10-30 DIAGNOSIS — Z89421 Acquired absence of other right toe(s): Secondary | ICD-10-CM | POA: Diagnosis not present

## 2018-10-30 DIAGNOSIS — E11628 Type 2 diabetes mellitus with other skin complications: Secondary | ICD-10-CM | POA: Diagnosis present

## 2018-10-30 DIAGNOSIS — Z791 Long term (current) use of non-steroidal anti-inflammatories (NSAID): Secondary | ICD-10-CM

## 2018-10-30 DIAGNOSIS — L02611 Cutaneous abscess of right foot: Secondary | ICD-10-CM | POA: Diagnosis not present

## 2018-10-30 DIAGNOSIS — B9561 Methicillin susceptible Staphylococcus aureus infection as the cause of diseases classified elsewhere: Secondary | ICD-10-CM | POA: Diagnosis not present

## 2018-10-30 DIAGNOSIS — I10 Essential (primary) hypertension: Secondary | ICD-10-CM | POA: Diagnosis present

## 2018-10-30 DIAGNOSIS — M869 Osteomyelitis, unspecified: Secondary | ICD-10-CM | POA: Diagnosis not present

## 2018-10-30 DIAGNOSIS — L97519 Non-pressure chronic ulcer of other part of right foot with unspecified severity: Secondary | ICD-10-CM | POA: Diagnosis present

## 2018-10-30 DIAGNOSIS — E1152 Type 2 diabetes mellitus with diabetic peripheral angiopathy with gangrene: Secondary | ICD-10-CM | POA: Diagnosis present

## 2018-10-30 DIAGNOSIS — E11621 Type 2 diabetes mellitus with foot ulcer: Secondary | ICD-10-CM | POA: Diagnosis present

## 2018-10-30 DIAGNOSIS — Z9114 Patient's other noncompliance with medication regimen: Secondary | ICD-10-CM

## 2018-10-30 DIAGNOSIS — Z792 Long term (current) use of antibiotics: Secondary | ICD-10-CM

## 2018-10-30 DIAGNOSIS — Z7984 Long term (current) use of oral hypoglycemic drugs: Secondary | ICD-10-CM | POA: Diagnosis not present

## 2018-10-30 DIAGNOSIS — L03115 Cellulitis of right lower limb: Secondary | ICD-10-CM | POA: Diagnosis present

## 2018-10-30 DIAGNOSIS — F17200 Nicotine dependence, unspecified, uncomplicated: Secondary | ICD-10-CM | POA: Diagnosis present

## 2018-10-30 DIAGNOSIS — Z79899 Other long term (current) drug therapy: Secondary | ICD-10-CM | POA: Diagnosis not present

## 2018-10-30 DIAGNOSIS — B9689 Other specified bacterial agents as the cause of diseases classified elsewhere: Secondary | ICD-10-CM | POA: Diagnosis not present

## 2018-10-30 DIAGNOSIS — Z794 Long term (current) use of insulin: Secondary | ICD-10-CM | POA: Diagnosis not present

## 2018-10-30 DIAGNOSIS — B954 Other streptococcus as the cause of diseases classified elsewhere: Secondary | ICD-10-CM | POA: Diagnosis not present

## 2018-10-30 LAB — CBC WITH DIFFERENTIAL/PLATELET
ABS IMMATURE GRANULOCYTES: 0.04 10*3/uL (ref 0.00–0.07)
Basophils Absolute: 0 10*3/uL (ref 0.0–0.1)
Basophils Relative: 0 %
EOS ABS: 0.2 10*3/uL (ref 0.0–0.5)
Eosinophils Relative: 2 %
HCT: 45.9 % (ref 39.0–52.0)
HEMOGLOBIN: 16 g/dL (ref 13.0–17.0)
IMMATURE GRANULOCYTES: 0 %
LYMPHS ABS: 1.6 10*3/uL (ref 0.7–4.0)
LYMPHS PCT: 15 %
MCH: 31.4 pg (ref 26.0–34.0)
MCHC: 34.9 g/dL (ref 30.0–36.0)
MCV: 90 fL (ref 80.0–100.0)
MONOS PCT: 11 %
Monocytes Absolute: 1.2 10*3/uL — ABNORMAL HIGH (ref 0.1–1.0)
NEUTROS PCT: 72 %
Neutro Abs: 7.1 10*3/uL (ref 1.7–7.7)
Platelets: 226 10*3/uL (ref 150–400)
RBC: 5.1 MIL/uL (ref 4.22–5.81)
RDW: 11.4 % — ABNORMAL LOW (ref 11.5–15.5)
WBC: 10.1 10*3/uL (ref 4.0–10.5)
nRBC: 0 % (ref 0.0–0.2)

## 2018-10-30 LAB — BASIC METABOLIC PANEL
ANION GAP: 8 (ref 5–15)
BUN: 11 mg/dL (ref 6–20)
CALCIUM: 10.9 mg/dL — AB (ref 8.9–10.3)
CHLORIDE: 100 mmol/L (ref 98–111)
CO2: 25 mmol/L (ref 22–32)
Creatinine, Ser: 0.81 mg/dL (ref 0.61–1.24)
GFR calc Af Amer: >60 mL/min (ref >60)
GFR calc non Af Amer: >60 mL/min (ref >60)
Glucose, Bld: 178 mg/dL — ABNORMAL HIGH (ref 70–99)
Potassium: 3.8 mmol/L (ref 3.5–5.1)
Sodium: 133 mmol/L — ABNORMAL LOW (ref 135–145)

## 2018-10-30 LAB — GLUCOSE, CAPILLARY
Glucose-Capillary: 175 mg/dL — ABNORMAL HIGH (ref 70–99)
Glucose-Capillary: 229 mg/dL — ABNORMAL HIGH (ref 70–99)

## 2018-10-30 LAB — LACTIC ACID, PLASMA: Lactic Acid, Venous: 1.9 mmol/L (ref 0.5–1.9)

## 2018-10-30 LAB — SURGICAL PCR SCREEN
MRSA, PCR: NEGATIVE
Staphylococcus aureus: NEGATIVE

## 2018-10-30 MED ORDER — ONDANSETRON HCL 4 MG/2ML IJ SOLN: 4.0000 mg | Freq: Four times a day (QID) | INTRAMUSCULAR | Status: DC | PRN

## 2018-10-30 MED ORDER — POLYETHYLENE GLYCOL 3350 17 G PO PACK: 17.0000 g | PACK | Freq: Every day | ORAL | Status: DC | PRN

## 2018-10-30 MED ORDER — VANCOMYCIN HCL 10 G IV SOLR
1500.0000 mg | Freq: Once | INTRAVENOUS | Status: AC
  Administered 2018-10-30: 1500 mg via INTRAVENOUS
  Filled 2018-10-30: qty 1500

## 2018-10-30 MED ORDER — KETOROLAC TROMETHAMINE 30 MG/ML IJ SOLN: 30.0000 mg | Freq: Four times a day (QID) | INTRAMUSCULAR | Status: DC | PRN

## 2018-10-30 MED ORDER — INSULIN GLARGINE 100 UNIT/ML ~~LOC~~ SOLN
20.0000 [IU] | Freq: Every day | SUBCUTANEOUS | Status: DC
  Administered 2018-10-30 – 2018-11-04 (×4): 20 [IU] via SUBCUTANEOUS
  Filled 2018-10-30 (×7): qty 0.2

## 2018-10-30 MED ORDER — VANCOMYCIN HCL 10 G IV SOLR
2000.0000 mg | Freq: Two times a day (BID) | INTRAVENOUS | Status: DC
  Administered 2018-10-31 – 2018-11-02 (×5): 2000 mg via INTRAVENOUS
  Filled 2018-10-30 (×7): qty 2000

## 2018-10-30 MED ORDER — PIPERACILLIN-TAZOBACTAM 3.375 G IVPB
3.3750 g | Freq: Three times a day (TID) | INTRAVENOUS | Status: DC
  Administered 2018-10-31 – 2018-11-01 (×5): 3.375 g via INTRAVENOUS
  Filled 2018-10-30 (×5): qty 50

## 2018-10-30 MED ORDER — LISINOPRIL 5 MG PO TABS
5.0000 mg | ORAL_TABLET | Freq: Every day | ORAL | Status: DC
  Administered 2018-10-30 – 2018-11-01 (×3): 5 mg via ORAL
  Filled 2018-10-30 (×3): qty 1

## 2018-10-30 MED ORDER — OMEGA-3-ACID ETHYL ESTERS 1 G PO CAPS
1.0000 g | ORAL_CAPSULE | ORAL | Status: DC
  Administered 2018-11-01 – 2018-11-03 (×2): 1 g via ORAL
  Filled 2018-10-30: qty 1

## 2018-10-30 MED ORDER — PIPERACILLIN SOD-TAZOBACTAM SO 2.25 (2-0.25) G IV SOLR
4.5000 g | Freq: Once | INTRAVENOUS | Status: AC
  Administered 2018-10-30: 4.5 g via INTRAVENOUS
  Filled 2018-10-30: qty 4.5

## 2018-10-30 MED ORDER — ACETAMINOPHEN 325 MG PO TABS: 650.0000 mg | ORAL_TABLET | Freq: Four times a day (QID) | ORAL | Status: DC | PRN

## 2018-10-30 MED ORDER — VANCOMYCIN HCL IN DEXTROSE 1-5 GM/200ML-% IV SOLN
1000.0000 mg | Freq: Once | INTRAVENOUS | Status: AC
  Administered 2018-10-30: 1000 mg via INTRAVENOUS
  Filled 2018-10-30: qty 200

## 2018-10-30 MED ORDER — OXYCODONE HCL 5 MG PO TABS
5.0000 mg | ORAL_TABLET | ORAL | Status: DC | PRN
  Administered 2018-10-30 – 2018-11-02 (×5): 5 mg via ORAL
  Filled 2018-10-30 (×5): qty 1

## 2018-10-30 MED ORDER — ONDANSETRON HCL 4 MG PO TABS: 4.0000 mg | ORAL_TABLET | Freq: Four times a day (QID) | ORAL | Status: DC | PRN

## 2018-10-30 MED ORDER — PIPERACILLIN-TAZOBACTAM 4.5 G IVPB
4.5000 g | Freq: Once | INTRAVENOUS | Status: DC
  Filled 2018-10-30: qty 100

## 2018-10-30 MED ORDER — INSULIN ASPART 100 UNIT/ML ~~LOC~~ SOLN
0.0000 [IU] | Freq: Three times a day (TID) | SUBCUTANEOUS | Status: DC
  Administered 2018-10-30 – 2018-10-31 (×3): 2 [IU] via SUBCUTANEOUS
  Administered 2018-11-01: 3 [IU] via SUBCUTANEOUS
  Administered 2018-11-01 – 2018-11-02 (×3): 2 [IU] via SUBCUTANEOUS
  Administered 2018-11-02: 3 [IU] via SUBCUTANEOUS
  Administered 2018-11-03: 5 [IU] via SUBCUTANEOUS
  Administered 2018-11-03 (×2): 2 [IU] via SUBCUTANEOUS
  Administered 2018-11-04 (×3): 3 [IU] via SUBCUTANEOUS
  Filled 2018-10-30 (×14): qty 1

## 2018-10-30 MED ORDER — PIPERACILLIN-TAZOBACTAM IN DEX 2-0.25 GM/50ML IV SOLN
4.5000 g | Freq: Once | INTRAVENOUS | Status: DC
  Filled 2018-10-30: qty 100

## 2018-10-30 MED ORDER — ACETAMINOPHEN 650 MG RE SUPP: 650.0000 mg | Freq: Four times a day (QID) | RECTAL | Status: DC | PRN

## 2018-10-30 MED ORDER — POVIDONE-IODINE 10 % EX SWAB: 2.0000 "application " | Freq: Once | CUTANEOUS | Status: DC

## 2018-10-30 MED ORDER — MUPIROCIN 2 % EX OINT
1.0000 "application " | TOPICAL_OINTMENT | Freq: Two times a day (BID) | CUTANEOUS | Status: DC
  Administered 2018-10-30: 1 via NASAL
  Filled 2018-10-30: qty 22

## 2018-10-30 MED ORDER — CHLORHEXIDINE GLUCONATE 4 % EX LIQD: 60.0000 mL | Freq: Once | CUTANEOUS | Status: DC

## 2018-10-30 MED ORDER — PIPERACILLIN-TAZOBACTAM 4.5 G IVPB: 4.5000 g | Freq: Once | INTRAVENOUS | Status: DC

## 2018-10-30 NOTE — H&P (Signed)
[facility] [place] NAME: [name]    MR#:  [id number]  DATE OF BIRTH:  September 18, 1966  DATE OF ADMISSION:  10/30/2018  PRIMARY CARE PHYSICIAN: Valera Castle, MD   REQUESTING/REFERRING PHYSICIAN: Dr Cherylann Banas  CHIEF COMPLAINT:  right foot swelling pain and open wound for several days  HISTORY OF PRESENT ILLNESS:  [name]  is a 54 y.o. male with a known history of diabetes, hypertension(not on meds) to the emergency room with increasing redness tenderness difficulty ambulating due to pain in the right foot. Patient had manipulated a callous in his right foot which progressively got infected and currently has erythema swelling open wound and of blister which x-rays of the foot showing soft tissue swelling, gas and possible osteomyelitis. Patient received IV vancomycin and Zosyn in the ER. Podiatry Dr. Vickki Muff aware about patient being admitted.  pt is being admitted with acute right foot infection with possible right fifth metatarsal osteomyelitis.  PAST MEDICAL HISTORY:   Past Medical History:  Diagnosis Date  . Diabetes ([place])   . Hypertension     PAST SURGICAL HISTOIRY:  History reviewed. No pertinent surgical history.  SOCIAL HISTORY:   Social History   Tobacco Use  . Smoking status: Light Tobacco Smoker    Types: Cigars  . Smokeless tobacco: Never Used  Substance Use Topics  . Alcohol use: Yes    FAMILY HISTORY:   Family History  Problem Relation Age of Onset  . CAD Father   . CAD Brother     DRUG ALLERGIES:  No Known Allergies  REVIEW OF SYSTEMS:  Review of Systems  Constitutional: Negative for chills, fever and weight loss.  HENT: Negative for ear discharge, ear pain and nosebleeds.   Eyes: Negative for blurred vision, pain and discharge.  Respiratory: Negative for sputum production, shortness of breath, wheezing and stridor.   Cardiovascular: Negative for chest pain, palpitations, orthopnea  and PND.  Gastrointestinal: Negative for abdominal pain, diarrhea, nausea and vomiting.  Genitourinary: Negative for frequency and urgency.  Musculoskeletal: Positive for joint pain. Negative for back pain.  Skin:       Right foot infection with necrosis swelling erythema of the right lateral part of the foot. Dark-colored blister present on the dorsal aspect of the right foot  Neurological: Positive for tremors. Negative for sensory change, speech change, focal weakness and weakness.  Psychiatric/Behavioral: Negative for depression and hallucinations. The patient is not nervous/anxious.      MEDICATIONS AT HOME:   Prior to Admission medications   Medication Sig Start Date End Date Taking? Authorizing Provider  GLIPIZIDE XL 5 MG 24 hr tablet Take 1 tablet by mouth daily. 02/08/18  Yes [provider]  ibuprofen (ADVIL,MOTRIN) 200 MG tablet Take 200-400 mg by mouth every 6 (six) hours as needed.   Yes [provider]  omega-3 acid ethyl esters (LOVAZA) 1 g capsule Take 1 g by mouth every other day.   Yes [provider]  amoxicillin-clavulanate (AUGMENTIN) 875-125 MG tablet Take 1 tablet by mouth every 12 (twelve) hours. 04/14/18   Fritzi Mandes, MD      VITAL SIGNS:  Blood pressure (!) 143/88, pulse 89, temperature 98.9 F (37.2 C), temperature source Oral, resp. rate 16, height 6\' 3"  (1.905 m), weight 106.6 kg, SpO2 97 %.  PHYSICAL EXAMINATION:  GENERAL:  53 y.o.-year-old patient lying in the bed with no acute distress.  EYES: Pupils equal, round, reactive to light and accommodation.  No scleral icterus. Extraocular muscles intact.  HEENT: Head atraumatic, normocephalic. Oropharynx and nasopharynx clear.  NECK:  Supple, no jugular venous distention. No thyroid enlargement, no tenderness.  LUNGS: Normal breath sounds bilaterally, no wheezing, rales,rhonchi or crepitation. No use of accessory muscles of respiration.  CARDIOVASCULAR: S1, S2 normal. No murmurs, rubs,  or gallops.  ABDOMEN: Soft, nontender, nondistended. Bowel sounds present. No organomegaly or mass.  EXTREMITIES:Right foot infection with necrosis swelling erythema of the right lateral part of the foot. Dark-colored blister present on the dorsal aspect of the right foot  NEUROLOGIC: Cranial nerves II through XII are intact. Muscle strength 5/5 in all extremities. Sensation intact. Gait not checked.  PSYCHIATRIC: The patient is alert and oriented x 3.  SKIN: No obvious rash, lesion, or ulcer.   LABORATORY PANEL:   CBC Recent Labs  Lab 10/30/18 1224  WBC 10.1  HGB 16.0  HCT 45.9  PLT 226   ------------------------------------------------------------------------------------------------------------------  Chemistries  Recent Labs  Lab 10/30/18 1224  NA 133*  K 3.8  CL 100  CO2 25  GLUCOSE 178*  BUN 11  CREATININE 0.81  CALCIUM 10.9*   ------------------------------------------------------------------------------------------------------------------  Cardiac Enzymes No results for input(s): TROPONINI in the last 168 hours. ------------------------------------------------------------------------------------------------------------------  RADIOLOGY:  Dg Foot Complete Right  Result Date: 10/30/2018 CLINICAL DATA:  Right foot ulcer. EXAM: RIGHT FOOT COMPLETE - 3+ VIEW COMPARISON:  None. FINDINGS: There is no evidence of fracture or dislocation. There is no evidence of arthropathy. Soft tissue swelling and gas is noted lateral to the distal fifth metatarsal as well as around the fifth metatarsophalangeal joint concerning for open wound and associated cellulitis. Some lucency is seen projected over the distal head of the fifth metatarsal which could represent osteomyelitis. IMPRESSION: Probable open wound and associated osteomyelitis seen in the region of the fifth metatarsophalangeal joint and soft tissues lateral to distal fifth metatarsal. Some lucency is seen projected over distal  head of fifth metatarsal which could represent osteomyelitis, or overlying soft tissue artifact. MRI is recommended for further evaluation. Electronically Signed   By: Marijo Conception, M.D.   On: 10/30/2018 12:18    EKG:    IMPRESSION AND PLAN:  [name]  is a 53 y.o. male with a known history of diabetes, hypertension(not on meds) to the emergency room with increasing redness tenderness difficulty ambulating due to pain in the right foot.  1. Right foot cellulitis/osteomyelitis fifth metatarsal -admit to MedSurg -IV vancomycin IV Zosyn pharmacy to dose -spoke with Dr. Vickki Muff will see patient today. Patient will need debridement -NPO after midnight -hold Lovenox -follow-up wound cultures. Patient may need long-term IV antibiotics pending cultures and surgical evaluation  2. Type II diabetes -sliding scale and Lantus  3. Hypertension -start lisinopril  4. DVT prophylaxis-- S CDs  subcu Lovenox will be started after surgery   All the records are reviewed and case discussed with ED provider.   CODE STATUS: full  TOTAL TIME TAKING CARE OF THIS PATIENT: *45* minutes.    Fritzi Mandes M.D on 10/30/2018 at 2:09 PM  Between 7am to 6pm - Pager - [phone]  After 6pm go to [email or web address] - password [email or web address] [facility]  [facility]  Office  [phone]  CC: Primary care physician; Valera Castle, MD

## 2018-10-30 NOTE — Consult Note (Signed)
ORTHOPAEDIC CONSULTATION  REQUESTING PHYSICIAN: Fritzi Mandes, MD  Chief Complaint: Right foot infection  HPI: [name] is a 52 y.o. male who complains of worsening infection right foot.  Patient states he had a callus to the area and tried soaking it a few weeks ago.  Progressive worsening redness.  Presents today with severe redness and drainage to his right foot.  History of diabetes.  Past Medical History:  Diagnosis Date  . Diabetes ([place])   . Hypertension    History reviewed. No pertinent surgical history. Social History   Socioeconomic History  . Marital status: Single    Spouse name: Not on file  . Number of children: Not on file  . Years of education: Not on file  . Highest education level: Not on file  Occupational History  . Not on file  Social Needs  . Financial resource strain: Not on file  . Food insecurity:    Worry: Not on file    Inability: Not on file  . Transportation needs:    Medical: Not on file    Non-medical: Not on file  Tobacco Use  . Smoking status: Light Tobacco Smoker    Types: Cigars  . Smokeless tobacco: Never Used  Substance and Sexual Activity  . Alcohol use: Yes  . Drug use: Never  . Sexual activity: Yes  Lifestyle  . Physical activity:    Days per week: Not on file    Minutes per session: Not on file  . Stress: Not on file  Relationships  . Social connections:    Talks on phone: Not on file    Gets together: Not on file    Attends religious service: Not on file    Active member of club or organization: Not on file    Attends meetings of clubs or organizations: Not on file    Relationship status: Not on file  Other Topics Concern  . Not on file  Social History Narrative  . Not on file   Family History  Problem Relation Age of Onset  . CAD Father   . CAD Brother    No Known Allergies Prior to Admission medications   Medication Sig Start Date End Date Taking? Authorizing Provider  GLIPIZIDE XL 5 MG 24 hr tablet Take  1 tablet by mouth daily. 02/08/18  Yes [provider]  ibuprofen (ADVIL,MOTRIN) 200 MG tablet Take 200-400 mg by mouth every 6 (six) hours as needed.   Yes [provider]  omega-3 acid ethyl esters (LOVAZA) 1 g capsule Take 1 g by mouth every other day.   Yes [provider]  amoxicillin-clavulanate (AUGMENTIN) 875-125 MG tablet Take 1 tablet by mouth every 12 (twelve) hours. 04/14/18   Fritzi Mandes, MD   Mr Foot Right Wo Contrast  Result Date: 10/30/2018 CLINICAL DATA:  Infection about the lateral aspect of the right foot since the patient removed a callus 3-4 weeks ago. The patient is diabetic. EXAM: MRI OF THE RIGHT FOREFOOT WITHOUT CONTRAST TECHNIQUE: Multiplanar, multisequence MR imaging of the right forefoot was performed. No intravenous contrast was administered. COMPARISON:  Plain films right foot earlier today. FINDINGS: Bones/Joint/Cartilage There is marrow edema in the distal 4 cm of the fifth metatarsal which is most intense in the head and neck of the fifth metatarsal. Marrow edema is also seen throughout the proximal phalanx the little toe. Minimal marrow edema in the middle and distal phalanges of the little toe is identified. Bone marrow signal is otherwise normal.  Small fifth MTP joint effusion is noted. Ligaments Intact. Muscles and Tendons There is atrophy of intrinsic musculature of the. No intramuscular fluid collection. Soft tissues Intense subcutaneous edema is present about the foot. There is an air and fluid collection in the dorsal subcutaneous tissues which extends from the mid diaphysis the fifth metatarsal to the mid aspect of the proximal phalanx of the little toe. The collection measures up to 5.3 cm long by 1 cm craniocaudal by 3 cm transverse and is consistent with abscess. IMPRESSION: Intense cellulitis about the foot is worst laterally. There is a subcutaneous abscess in the dorsal soft tissues of the lateral foot extending from the mid diaphysis of  the fifth metatarsal to the mid aspect of the proximal phalanx of the fifth toe. 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 Marrow signal abnormality in the distal 4 cm of the fifth metatarsal is worst in the head and neck where is consistent with osteomyelitis. More proximally, signal abnormality could be due to osteomyelitis or reactive change. Mild edema in the middle and distal phalanges of the fifth toe is likely reactive. Electronically Signed   By: Inge Rise M.D.   On: 10/30/2018 15:18   Dg Foot Complete Right  Result Date: 10/30/2018 CLINICAL DATA:  Right foot ulcer. EXAM: RIGHT FOOT COMPLETE - 3+ VIEW COMPARISON:  None. FINDINGS: There is no evidence of fracture or dislocation. There is no evidence of arthropathy. Soft tissue swelling and gas is noted lateral to the distal fifth metatarsal as well as around the fifth metatarsophalangeal joint concerning for open wound and associated cellulitis. Some lucency is seen projected over the distal head of the fifth metatarsal which could represent osteomyelitis. IMPRESSION: Probable open wound and associated osteomyelitis seen in the region of the fifth metatarsophalangeal joint and soft tissues lateral to distal fifth metatarsal. Some lucency is seen projected over distal head of fifth metatarsal which could represent osteomyelitis, or overlying soft tissue artifact. MRI is recommended for further evaluation. Electronically Signed   By: Marijo Conception, M.D.   On: 10/30/2018 12:18    Positive ROS: All other systems have been reviewed and were otherwise negative with the exception of those mentioned in the HPI and as above.  12 point ROS was performed.  Physical Exam: General: Alert and oriented.  No apparent distress.  Vascular:  Left foot:Dorsalis Pedis:  present Posterior Tibial:  present  Right foot: Dorsalis Pedis:  diminished Posterior Tibial:  present.   Diminished pulses secondary to edema most likely.  Capillary fill time is brisk.  Neuro:absent protective sensation.  Gross sensation is intact.  Derm: Left foot without issue.  Right foot with large blistering abscess and ulceration to the lateral aspect of the fifth MTPJ.  Hemorrhagic bulla dorsally.  Diffuse cellulitis to the entire foot extending to the ankle region.  Ortho/MS: Severe diffuse edema to the right lower extremity.       Assessment: Diabetic foot infection with osteomyelitis right fifth metatarsal phalangeal joint  Plan: Patient with confirmed osteomyelitis distal lateral right foot.  Will need operative debridement in OR tomorrow.  Plan for surgery tomorrow morning.  Discussed with patient and plan for amputation of fifth toe and distal fifth metatarsal with I&D of remaining infection.  Discussed risks benefits alternatives and complications.  Discussed need for nonweightbearing to the right foot.  P.o. after midnight tonight.    Elesa Hacker, DPM Cell [phone]  10/30/2018 5:29 PM

## 2018-10-30 NOTE — Consult Note (Signed)
Pharmacy Antibiotic Note  [name] is a 53 y.o. male admitted on 10/30/2018 with wound infection.  Pharmacy has been consulted for Vancomycin/Zosyn dosing.  Plan: Zosyn 4.5g load, followed by Zosyn 3.375g IV q8h (4 hour infusion).  Vancomycin 2500mg  load (1000mg  + 1500mg ), followed by Vancomycin 2000 mg IV Q 12 hrs. Goal AUC 400-550. Expected AUC: 472.8 SCr used: 0.81  Height: 6\' 3"  (190.5 cm) Weight: 235 lb (106.6 kg) IBW/kg (Calculated) : 84.5  Temp (24hrs), Avg:98.9 F (37.2 C), Min:98.9 F (37.2 C), Max:98.9 F (37.2 C)  Recent Labs  Lab 10/30/18 1146 10/30/18 1224  WBC  --  10.1  CREATININE  --  0.81  LATICACIDVEN 1.9  --     Estimated Creatinine Clearance: 140.8 mL/min (by C-G formula based on SCr of 0.81 mg/dL).    No Known Allergies  Antimicrobials this admission: Vancomycin 2/1 >>  Zosyn 2/1 >>   Dose adjustments this admission: N/A  Microbiology results: 2/1 BCx: pending  Thank you for allowing pharmacy to be a part of this patient's care.  Lu Duffel, PharmD, BCPS Clinical Pharmacist 10/30/2018 2:26 PM

## 2018-10-30 NOTE — ED Notes (Signed)
Patient returned from MRI. Vancomycin restarted.

## 2018-10-30 NOTE — ED Triage Notes (Signed)
Pulled callous off foot 3-4 weeks ago.  Is diabetic, on glipizide.  Reports infection has spread and foot is now black and draining.  Drainage is green/yellow and now is bloody.  Wife had been cleaning with peroxide.  Subjective fevers.  + swelling.

## 2018-10-30 NOTE — ED Provider Notes (Signed)
[facility] Emergency Department Provider Note ____________________________________________   First MD Initiated Contact with Patient 10/30/18 1132     (approximate)  I have reviewed the triage vital signs and the nursing notes.   HISTORY  Chief Complaint Foot Problem    HPI [name] is a 53 y.o. male with PMH as noted below who presents with ulcer, swelling, and pain to the right foot at the base of the fifth toe, gradual onset over the last several weeks, and associated with redness spreading up the lower leg as well as pus drainage from the wound.  He states it started after he put a compression bandage on the leg and the edge of the bandage caused a cut to the lateral part of the foot at the base of the toe.  The patient reports some intermittent fevers in the last few days.  He states that he is on glipizide but does not check his glucose.  Past Medical History:  Diagnosis Date  . Diabetes ([place])   . Hypertension     Patient Active Problem List   Diagnosis Date Noted  . Dog bite 04/13/2018    History reviewed. No pertinent surgical history.  Prior to Admission medications   Medication Sig Start Date End Date Taking? Authorizing Provider  GLIPIZIDE XL 5 MG 24 hr tablet Take 1 tablet by mouth daily. 02/08/18  Yes [provider]  ibuprofen (ADVIL,MOTRIN) 200 MG tablet Take 200-400 mg by mouth every 6 (six) hours as needed.   Yes [provider]  omega-3 acid ethyl esters (LOVAZA) 1 g capsule Take 1 g by mouth every other day.   Yes [provider]  amoxicillin-clavulanate (AUGMENTIN) 875-125 MG tablet Take 1 tablet by mouth every 12 (twelve) hours. 04/14/18   Fritzi Mandes, MD    Allergies Patient has no known allergies.  Family History  Problem Relation Age of Onset  . CAD Father   . CAD Brother     Social History Social History   Tobacco Use  . Smoking status: Light Tobacco Smoker    Types: Cigars  .  Smokeless tobacco: Never Used  Substance Use Topics  . Alcohol use: Yes  . Drug use: Never    Review of Systems  Constitutional: Positive for intermittent fevers. Eyes: No redness. ENT: No sore throat. Cardiovascular: Denies chest pain. Respiratory: Denies shortness of breath. Gastrointestinal: No vomiting or diarrhea.  Genitourinary: Negative for dysuria.  Musculoskeletal: Negative for back pain.  Positive for right foot pain. Skin: Positive for rash. Neurological: Negative for headache.   ____________________________________________   PHYSICAL EXAM:  VITAL SIGNS: ED Triage Vitals  Enc Vitals Group     BP 10/30/18 1126 (!) 141/88     Pulse Rate 10/30/18 1126 89     Resp 10/30/18 1126 18     Temp 10/30/18 1126 98.9 F (37.2 C)     Temp Source 10/30/18 1126 Oral     SpO2 10/30/18 1126 99 %     Weight 10/30/18 1124 235 lb (106.6 kg)     Height 10/30/18 1124 6\' 3"  (1.905 m)     Head Circumference --      Peak Flow --      Pain Score 10/30/18 1124 8     Pain Loc --      Pain Edu? --      Excl. in [place]? --     Constitutional: Alert and oriented.  Relatively comfortable appearing and in no acute distress.  Eyes: Conjunctivae are normal.  Head: Atraumatic. Nose: No congestion/rhinnorhea. Mouth/Throat: Mucous membranes are moist.   Neck: Normal range of motion.  Cardiovascular: Good peripheral circulation. Respiratory: Normal respiratory effort.   Gastrointestinal:  No distention.  Musculoskeletal: Extremities warm and well perfused.  Right base of great toe with approximately 4 cm superficial skin ulceration with approximately 1 cm area of deeper ulceration.  Surrounding erythema, warmth, and induration. Neurologic:  Normal speech and language. No gross focal neurologic deficits are appreciated.  Skin:  Skin is warm and dry.  Erythema to dorsal lateral aspect of right foot, extending to just proximal to the ankle. Psychiatric: Mood and affect are normal. Speech and  behavior are normal.  ____________________________________________   LABS (all labs ordered are listed, but only abnormal results are displayed)  Labs Reviewed  BASIC METABOLIC PANEL - Abnormal; Notable for the following components:      Result Value   Sodium 133 (*)    Glucose, Bld 178 (*)    Calcium 10.9 (*)    All other components within normal limits  CBC WITH DIFFERENTIAL/PLATELET - Abnormal; Notable for the following components:   RDW 11.4 (*)    Monocytes Absolute 1.2 (*)    All other components within normal limits  CULTURE, BLOOD (ROUTINE X 2)  CULTURE, BLOOD (ROUTINE X 2)  LACTIC ACID, PLASMA  LACTIC ACID, PLASMA   ____________________________________________  EKG   ____________________________________________  RADIOLOGY  XR R foot: Findings consistent with osteomyelitis  ____________________________________________   PROCEDURES  Procedure(s) performed: No  Procedures  Critical Care performed: No ____________________________________________   INITIAL IMPRESSION / ASSESSMENT AND PLAN / ED COURSE  Pertinent labs & imaging results that were available during my care of the patient were reviewed by me and considered in my medical decision making (see chart for details).  53 year old male with history of diabetes (likely poorly controlled given that he is only on glipizide and states he does not check his sugar) and hypertension presents with right foot ulcer at the base of the great toe which is draining pus and is associated with erythema and induration spreading up to the lower leg.  He has had some subjective fevers over the last few days.  On exam there is a small area of deeper appearing ulceration at the base of the fifth toe laterally, with superficial ulceration around it, and then erythema and induration spreading up to just proximal to the ankle.  The presentation is consistent with cellulitis surrounding a diabetic ulcer.  We will obtain x-ray to  evaluate for signs of osteomyelitis, lab work-up, and reassess.  I will likely consult for evaluation for possible debridement.  ----------------------------------------- 1:35 PM on 10/30/2018 -----------------------------------------  X-ray shows findings consistent with osteomyelitis.  I ordered vancomycin and Zosyn.  I consulted Dr. Vickki Muff from podiatry who agrees to come and evaluate the patient for debridement.  I also ordered an MRI for further evaluation.  The patient will require inpatient admission.  I signed him out to the hospitalist Dr. Posey Pronto at approximately 1:35 PM. ____________________________________________   FINAL CLINICAL IMPRESSION(S) / ED DIAGNOSES  Final diagnoses:  Subacute osteomyelitis of right foot ([place])      NEW MEDICATIONS STARTED DURING THIS VISIT:  New Prescriptions   No medications on file     Note:  This document was prepared using Dragon voice recognition software and may include unintentional dictation errors.    Arta Silence, MD 10/30/18 1336

## 2018-10-30 NOTE — ED Notes (Signed)
Vanc is suspended at this time due to patient going to MRI.

## 2018-10-31 ENCOUNTER — Inpatient Hospital Stay: Payer: BC Managed Care – PPO | Admitting: Anesthesiology

## 2018-10-31 ENCOUNTER — Encounter
Admission: EM | Disposition: A | Discharge status: Home Health | Payer: Self-pay | Source: Home / Self Care | Attending: Internal Medicine

## 2018-10-31 HISTORY — PX: IRRIGATION AND DEBRIDEMENT ABSCESS: SHX5252

## 2018-10-31 LAB — GLUCOSE, CAPILLARY
Glucose-Capillary: 191 mg/dL — ABNORMAL HIGH (ref 70–99)
Glucose-Capillary: 172 mg/dL — ABNORMAL HIGH (ref 70–99)
Glucose-Capillary: 207 mg/dL — ABNORMAL HIGH (ref 70–99)
Glucose-Capillary: 257 mg/dL — ABNORMAL HIGH (ref 70–99)

## 2018-10-31 SURGERY — IRRIGATION AND DEBRIDEMENT ABSCESS
Anesthesia: General | Laterality: Right

## 2018-10-31 MED ORDER — MORPHINE SULFATE (PF) 2 MG/ML IV SOLN: 2.0000 mg | INTRAVENOUS | Status: DC | PRN

## 2018-10-31 MED ORDER — FENTANYL CITRATE (PF) 100 MCG/2ML IJ SOLN
INTRAMUSCULAR | Status: AC
  Filled 2018-10-31: qty 2

## 2018-10-31 MED ORDER — PROPOFOL 500 MG/50ML IV EMUL
INTRAVENOUS | Status: DC | PRN
  Administered 2018-10-31: 100 ug/kg/min via INTRAVENOUS

## 2018-10-31 MED ORDER — BUPIVACAINE HCL 0.5 % IJ SOLN
INTRAMUSCULAR | Status: DC | PRN
  Administered 2018-10-31 (×2): 10 mL

## 2018-10-31 MED ORDER — FENTANYL CITRATE (PF) 100 MCG/2ML IJ SOLN
INTRAMUSCULAR | Status: DC | PRN
  Administered 2018-10-31: 25 ug via INTRAVENOUS
  Administered 2018-10-31: 50 ug via INTRAVENOUS
  Administered 2018-10-31 (×2): 25 ug via INTRAVENOUS
  Administered 2018-10-31: 50 ug via INTRAVENOUS
  Administered 2018-10-31: 25 ug via INTRAVENOUS

## 2018-10-31 MED ORDER — PROPOFOL 10 MG/ML IV BOLUS
INTRAVENOUS | Status: DC | PRN
  Administered 2018-10-31: 80 mg via INTRAVENOUS

## 2018-10-31 MED ORDER — PROPOFOL 500 MG/50ML IV EMUL
INTRAVENOUS | Status: AC
  Filled 2018-10-31: qty 50

## 2018-10-31 MED ORDER — KETAMINE HCL 50 MG/ML IJ SOLN
INTRAMUSCULAR | Status: AC
  Filled 2018-10-31: qty 10

## 2018-10-31 MED ORDER — HYDRALAZINE HCL 20 MG/ML IJ SOLN
5.0000 mg | INTRAMUSCULAR | Status: DC | PRN
  Administered 2018-11-01 – 2018-11-02 (×2): 5 mg via INTRAVENOUS
  Filled 2018-10-31 (×2): qty 1

## 2018-10-31 MED ORDER — BUPIVACAINE HCL (PF) 0.5 % IJ SOLN
INTRAMUSCULAR | Status: AC
  Filled 2018-10-31: qty 30

## 2018-10-31 MED ORDER — HYDROMORPHONE HCL 1 MG/ML IJ SOLN: 0.2500 mg | INTRAMUSCULAR | Status: DC | PRN

## 2018-10-31 MED ORDER — LIDOCAINE-EPINEPHRINE 1 %-1:100000 IJ SOLN
INTRAMUSCULAR | Status: AC
  Filled 2018-10-31: qty 1

## 2018-10-31 MED ORDER — LIDOCAINE HCL 1 % IJ SOLN
INTRAMUSCULAR | Status: DC | PRN
  Administered 2018-10-31: 10 mL

## 2018-10-31 MED ORDER — LIDOCAINE HCL (PF) 1 % IJ SOLN
INTRAMUSCULAR | Status: AC
  Filled 2018-10-31: qty 30

## 2018-10-31 MED ORDER — SODIUM CHLORIDE 0.9 % IV SOLN
INTRAVENOUS | Status: DC
  Administered 2018-10-31 (×2): via INTRAVENOUS

## 2018-10-31 MED ORDER — SODIUM CHLORIDE FLUSH 0.9 % IV SOLN
INTRAVENOUS | Status: AC
  Filled 2018-10-31: qty 10

## 2018-10-31 MED ORDER — MIDAZOLAM HCL 2 MG/2ML IJ SOLN
INTRAMUSCULAR | Status: DC | PRN
  Administered 2018-10-31: 2 mg via INTRAVENOUS

## 2018-10-31 MED ORDER — KETAMINE HCL 10 MG/ML IJ SOLN
INTRAMUSCULAR | Status: DC | PRN
  Administered 2018-10-31: 50 mg via INTRAVENOUS

## 2018-10-31 MED ORDER — THROMBIN 5000 UNITS EX SOLR
CUTANEOUS | Status: AC
  Filled 2018-10-31: qty 5000

## 2018-10-31 MED ORDER — MIDAZOLAM HCL 2 MG/2ML IJ SOLN
INTRAMUSCULAR | Status: AC
  Filled 2018-10-31: qty 2

## 2018-10-31 SURGICAL SUPPLY — 62 items
BANDAGE ACE 4X5 VEL STRL LF (GAUZE/BANDAGES/DRESSINGS) ×2 IMPLANT
BLADE OSC/SAGITTAL MD 5.5X18 (BLADE) ×2 IMPLANT
BLADE OSCILLATING/SAGITTAL (BLADE)
BLADE SW THK.38XMED LNG THN (BLADE) IMPLANT
BNDG COHESIVE 4X5 TAN STRL (GAUZE/BANDAGES/DRESSINGS) ×2 IMPLANT
BNDG COHESIVE 6X5 TAN STRL LF (GAUZE/BANDAGES/DRESSINGS) ×2 IMPLANT
BNDG CONFORM 2 STRL LF (GAUZE/BANDAGES/DRESSINGS) ×2 IMPLANT
BNDG CONFORM 3 STRL LF (GAUZE/BANDAGES/DRESSINGS) ×2 IMPLANT
BNDG ESMARK 4X12 TAN STRL LF (GAUZE/BANDAGES/DRESSINGS) ×2 IMPLANT
BNDG GAUZE 4.5X4.1 6PLY STRL (MISCELLANEOUS) ×2 IMPLANT
CANISTER SUCT 1200ML W/VALVE (MISCELLANEOUS) ×2 IMPLANT
CANISTER SUCT 3000ML PPV (MISCELLANEOUS) ×2 IMPLANT
COVER WAND RF STERILE (DRAPES) ×2 IMPLANT
CUFF TOURN 18 STER (MISCELLANEOUS) ×2 IMPLANT
CUFF TOURN DUAL PL 12 NO SLV (MISCELLANEOUS) IMPLANT
DRAPE FLUOR MINI C-ARM 54X84 (DRAPES) ×2 IMPLANT
DRAPE XRAY CASSETTE 23X24 (DRAPES) IMPLANT
DRESSING ALLEVYN 4X4 (MISCELLANEOUS) IMPLANT
DURAPREP 26ML APPLICATOR (WOUND CARE) ×2 IMPLANT
ELECT REM PT RETURN 9FT ADLT (ELECTROSURGICAL) ×2
ELECTRODE REM PT RTRN 9FT ADLT (ELECTROSURGICAL) ×1 IMPLANT
GAUZE PACKING 1/4 X5 YD (GAUZE/BANDAGES/DRESSINGS) ×2 IMPLANT
GAUZE PACKING IODOFORM 1X5 (MISCELLANEOUS) ×2 IMPLANT
GAUZE PETRO XEROFOAM 1X8 (MISCELLANEOUS) ×2 IMPLANT
GAUZE SPONGE 4X4 12PLY STRL (GAUZE/BANDAGES/DRESSINGS) ×2 IMPLANT
GLOVE BIO SURGEON STRL SZ7.5 (GLOVE) ×2 IMPLANT
GLOVE INDICATOR 8.0 STRL GRN (GLOVE) ×8 IMPLANT
GOWN STRL REUS W/ TWL LRG LVL3 (GOWN DISPOSABLE) ×2 IMPLANT
GOWN STRL REUS W/TWL LRG LVL3 (GOWN DISPOSABLE) ×2
GOWN STRL REUS W/TWL MED LVL3 (GOWN DISPOSABLE) ×4 IMPLANT
HANDPIECE VERSAJET DEBRIDEMENT (MISCELLANEOUS) ×2 IMPLANT
IV NS 1000ML (IV SOLUTION) ×2
IV NS 1000ML BAXH (IV SOLUTION) ×2 IMPLANT
KIT TURNOVER KIT A (KITS) ×2 IMPLANT
LABEL OR SOLS (LABEL) ×2 IMPLANT
NEEDLE FILTER BLUNT 18X 1/2SAF (NEEDLE) ×1
NEEDLE FILTER BLUNT 18X1 1/2 (NEEDLE) ×1 IMPLANT
NEEDLE HYPO 25X1 1.5 SAFETY (NEEDLE) ×2 IMPLANT
NS IRRIG 500ML POUR BTL (IV SOLUTION) ×2 IMPLANT
PACK EXTREMITY ARMC (MISCELLANEOUS) ×2 IMPLANT
PAD ABD DERMACEA PRESS 5X9 (GAUZE/BANDAGES/DRESSINGS) ×4 IMPLANT
PULSAVAC PLUS IRRIG FAN TIP (DISPOSABLE) ×2
RASP SM TEAR CROSS CUT (RASP) IMPLANT
SHIELD FULL FACE ANTIFOG 7M (MISCELLANEOUS) ×2 IMPLANT
SOL .9 NS 3000ML IRR  AL (IV SOLUTION)
SOL .9 NS 3000ML IRR UROMATIC (IV SOLUTION) IMPLANT
SOL PREP PVP 2OZ (MISCELLANEOUS) ×2
SOLUTION PREP PVP 2OZ (MISCELLANEOUS) ×1 IMPLANT
SPOGE SURGIFLO 8M (HEMOSTASIS) ×1
SPONGE SURGIFLO 8M (HEMOSTASIS) ×1 IMPLANT
STOCKINETTE IMPERVIOUS 9X36 MD (GAUZE/BANDAGES/DRESSINGS) ×2 IMPLANT
SUT ETHILON 2 0 FS 18 (SUTURE) ×4 IMPLANT
SUT ETHILON 4-0 (SUTURE) ×1
SUT ETHILON 4-0 FS2 18XMFL BLK (SUTURE) ×1
SUT VIC AB 3-0 SH 27 (SUTURE) ×1
SUT VIC AB 3-0 SH 27X BRD (SUTURE) ×1 IMPLANT
SUT VIC AB 4-0 FS2 27 (SUTURE) ×2 IMPLANT
SUTURE ETHLN 4-0 FS2 18XMF BLK (SUTURE) ×1 IMPLANT
SWAB CULTURE AMIES ANAERIB BLU (MISCELLANEOUS) IMPLANT
SYR 10ML LL (SYRINGE) ×4 IMPLANT
SYR 3ML LL SCALE MARK (SYRINGE) ×2 IMPLANT
TIP FAN IRRIG PULSAVAC PLUS (DISPOSABLE) ×1 IMPLANT

## 2018-10-31 NOTE — Progress Notes (Signed)
[facility] [place] NAME: [name]    MR#:  [id number]  DATE OF BIRTH:  07-03-1966  SUBJECTIVE:   Patient doing well after surgery.  States that he was able to eat and drink without any issues.  He denies any right foot pain at this time.  REVIEW OF SYSTEMS:  Review of Systems  Constitutional: Negative for chills and fever.  HENT: Negative for congestion and sore throat.   Eyes: Negative for blurred vision.  Respiratory: Negative for cough and shortness of breath.   Cardiovascular: Negative for chest pain, palpitations and leg swelling.  Gastrointestinal: Negative for nausea and vomiting.  Genitourinary: Negative for dysuria and urgency.  Musculoskeletal: Negative for back pain and neck pain.  Neurological: Negative for dizziness and headaches.  Psychiatric/Behavioral: Negative for depression. The patient is not nervous/anxious.     DRUG ALLERGIES:  No Known Allergies VITALS:  Blood pressure (!) 162/95, pulse 87, temperature 97.7 F (36.5 C), temperature source Oral, resp. rate 18, height 6\' 3"  (1.905 m), weight 106.6 kg, SpO2 98 %. PHYSICAL EXAMINATION:  Physical Exam  GENERAL:  53 y.o. patient lying in the bed with no acute distress.  EYES: Pupils equal, round, reactive to light and accommodation. No scleral icterus. Extraocular muscles intact.  HEENT: Head atraumatic, normocephalic. Oropharynx and nasopharynx clear.  Moist mucous membranes. NECK:  Supple, no jugular venous distention. No thyroid enlargement, no tenderness.  LUNGS: Normal breath sounds bilaterally, no wheezing, rales,rhonchi or crepitation. No use of accessory muscles of respiration.  CARDIOVASCULAR: RRR, S1, S2 normal. No murmurs, rubs, or gallops.  ABDOMEN: Soft, nontender, nondistended. Bowel sounds present. No organomegaly or mass.  EXTREMITIES: Dry bandage in place over right foot.  No edema of the left foot.  Lower extremities are warm to the touch.  No  cyanosis or clubbing. NEUROLOGIC: Cranial nerves II through XII are intact. Muscle strength 5/5 in all extremities. Sensation intact. Gait not checked.  PSYCHIATRIC: The patient is alert and oriented x 3.  SKIN: No obvious rash, lesion, or ulcer.  LABORATORY PANEL:  Male CBC Recent Labs  Lab 10/30/18 1224  WBC 10.1  HGB 16.0  HCT 45.9  PLT 226   ------------------------------------------------------------------------------------------------------------------ Chemistries  Recent Labs  Lab 10/30/18 1224  NA 133*  K 3.8  CL 100  CO2 25  GLUCOSE 178*  BUN 11  CREATININE 0.81  CALCIUM 10.9*   RADIOLOGY:  Mr Foot Right Wo Contrast  Result Date: 10/30/2018 CLINICAL DATA:  Infection about the lateral aspect of the right foot since the patient removed a callus 3-4 weeks ago. The patient is diabetic. EXAM: MRI OF THE RIGHT FOREFOOT WITHOUT CONTRAST TECHNIQUE: Multiplanar, multisequence MR imaging of the right forefoot was performed. No intravenous contrast was administered. COMPARISON:  Plain films right foot earlier today. FINDINGS: Bones/Joint/Cartilage There is marrow edema in the distal 4 cm of the fifth metatarsal which is most intense in the head and neck of the fifth metatarsal. Marrow edema is also seen throughout the proximal phalanx the little toe. Minimal marrow edema in the middle and distal phalanges of the little toe is identified. Bone marrow signal is otherwise normal. Small fifth MTP joint effusion is noted. Ligaments Intact. Muscles and Tendons There is atrophy of intrinsic musculature of the. No intramuscular fluid collection. Soft tissues Intense subcutaneous edema is present about the foot. There is an air and fluid collection in the dorsal subcutaneous tissues which extends from the mid diaphysis the  fifth metatarsal to the mid aspect of the proximal phalanx of the little toe. The collection measures up to 5.3 cm long by 1 cm craniocaudal by 3 cm transverse and is  consistent with abscess. IMPRESSION: Intense cellulitis about the foot is worst laterally. There is a subcutaneous abscess in the dorsal soft tissues of the lateral foot extending from the mid diaphysis of the fifth metatarsal to the mid aspect of the proximal phalanx of the fifth toe. 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 Marrow signal abnormality in the distal 4 cm of the fifth metatarsal is worst in the head and neck where is consistent with osteomyelitis. More proximally, signal abnormality could be due to osteomyelitis or reactive change. Mild edema in the middle and distal phalanges of the fifth toe is likely reactive. Electronically Signed   By: Inge Rise M.D.   On: 10/30/2018 15:18   ASSESSMENT AND PLAN:   1. 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 -Podiatry following -Continue IV Vanco and Zosyn -Plan for ID consult tomorrow (no in-house coverage over the weekend) -Follow-up blood cultures -Follow-up intraoperative cultures -Pain control -PT recommended home health PT  2. Type II diabetes-blood sugars mildly elevated, likely due to infection -Continue sliding scale and Lantus  3. Hypertension- BPs mildly elevated -Lisinopril started this admission -Add IV hydralazine PRN  4. DVT prophylaxis-- SCDs -Can likely start Lovenox for DVT prophylaxis tomorrow  All the records are reviewed and case discussed with Care Management/Social Worker. Management plans discussed with the patient, family and they are in agreement.  CODE STATUS: Full Code  TOTAL TIME TAKING CARE OF THIS PATIENT: 35 minutes.   More than 50% of the time was spent in counseling/coordination of care: YES  POSSIBLE D/C IN 2-3 DAYS, DEPENDING ON CLINICAL CONDITION.   Berna Spare Mayo M.D on 10/31/2018 at 1:04 PM  Between 7am to  6pm - Pager [phone]  After 6pm go to [email or web address] - Proofreader  [facility]  Office  [phone]  CC: Primary care physician; Valera Castle, MD  Note: This dictation was prepared with Dragon dictation along with smaller phrase technology. Any transcriptional errors that result from this process are unintentional.

## 2018-10-31 NOTE — Anesthesia Post-op Follow-up Note (Signed)
Anesthesia QCDR form completed.        

## 2018-10-31 NOTE — Anesthesia Preprocedure Evaluation (Addendum)
Anesthesia Evaluation  Patient identified by MRN, date of birth, ID band Patient awake    Reviewed: Allergy & Precautions, H&P , NPO status , Patient's Chart, lab work & pertinent test results  Airway Mallampati: II       Dental  (+) Teeth Intact   Pulmonary Current Smoker,           Cardiovascular hypertension,      Neuro/Psych negative neurological ROS  negative psych ROS   GI/Hepatic negative GI ROS, Neg liver ROS,   Endo/Other  diabetes  Renal/GU      Musculoskeletal   Abdominal   Peds  Hematology negative hematology ROS (+)   Anesthesia Other Findings Past Medical History: No date: Diabetes ([place]) No date: Hypertension  History reviewed. No pertinent surgical history.  BMI    Body Mass Index:  29.37 kg/m      Reproductive/Obstetrics negative OB ROS                            Anesthesia Physical Anesthesia Plan  ASA: III  Anesthesia Plan: General   Post-op Pain Management:    Induction:   PONV Risk Score and Plan: Ondansetron and Midazolam  Airway Management Planned:   Additional Equipment:   Intra-op Plan:   Post-operative Plan:   Informed Consent: I have reviewed the patients History and Physical, chart, labs and discussed the procedure including the risks, benefits and alternatives for the proposed anesthesia with the patient or authorized representative who has indicated his/her understanding and acceptance.     Dental Advisory Given  Plan Discussed with: Anesthesiologist and CRNA  Anesthesia Plan Comments:        Anesthesia Quick Evaluation

## 2018-10-31 NOTE — Op Note (Signed)
Operative note   Surgeon:Jaequan Propes Lawyer: None    Preop diagnosis: Osteomyelitis right fifth metatarsal phalangeal joint 2.  Abscess dorsal right foot    Postop diagnosis: Same    Procedure: Partial fifth ray amputation right foot 2.  Incision and drainage abscess dorsal right foot    EBL: Minimal    Anesthesia:local and general.  Local consisted of a one-to-one mixture of 0.5% bupivacaine and 1% lidocaine plain preoperatively.  A total of 20 cc was given.  10 mL's of 0.5% bupivacaine was given at the end of the procedure.    Hemostasis: Mid calf tourniquet inflated to 200 mmHg for 15 minutes    Specimen: Bone for culture and fifth ray for pathology    Complications: None    Operative indications:[name] is an 53 y.o. that presents today for surgical intervention.  The risks/benefits/alternatives/complications have been discussed and consent has been given.    Procedure:  Patient was brought into the OR and placed on the operating table in thesupine position. After anesthesia was obtained theright lower extremity was prepped and draped in usual sterile fashion.  The right lower extremity was prepped and draped in usual sterile fashion.  Attention was directed to the lateral aspect of the fifth metatarsal region where longitudinal incision was begun on the dorsal lateral aspect of the fifth ray and circumferential around the distal fifth toe at the MTPJ.  Full-thickness dissection was carried out.  Skin flaps were created dorsal and plantar.  At this time an osteotomy was created at the base of the fifth metatarsal.  The toe and metatarsal was then excised from the surgical field in toto.  Bone culture was removed from the distal fifth metatarsal at the ulcerative site.  The proximal aspect of the metatarsal was inked for proximal margin.  The wound was flushed with copious amounts of irrigation.  There was noted to be a small amount of infection overlying the dorsal fourth  metatarsal.  The dissection was carried out over this area and drained a small amount of purulence.  This part of the wound was then drained and cleaned with the versa jet.  All edges were then sharp and then freshened with the versa jet and 15 blade.  The tourniquet was dropped and bleeders were Bovie cauterized as appropriate.  Closure was performed with a 2-0 nylon.  Portions of the superficial skin were necrotic and were excised.  Local skin flaps were created to close the necrotic skin areas.  Small area was left open for packing in the central lateral aspect of the incision.  The wound site was infiltrated with Surgi-Flo with topical thrombin.  A bulky sterile dressing was applied with packing to the lateral aspect of the incision with 4 x 4.  0.5% bupivacaine was infiltrated prior to placement of the dressing.    Patient tolerated the procedure and anesthesia well.  Was transported from the OR to the PACU with all vital signs stable and vascular status intact. To be discharged per routine protocol.

## 2018-10-31 NOTE — Evaluation (Signed)
Physical Therapy Evaluation Patient Details Name: [name] MRN: [id number] DOB: 30-Apr-1966 Today's Date: 10/31/2018   History of Present Illness  pt is a 53 y.o M admitted on 10/30/2018 with dx of osteomylitis. known history of diabetes, hypertension, and came into the ED with R foot pain and inability to bear weight that occurred from manipulating a callous that became infected. He was admitted the area was irrigated and debridged on 10/31/2018.  Clinical Impression  Pt was laying in bed and accompanied with his significant other when PT entered the room. Pt is a A&O x4 and demonstrates functional strength in the LLE and R hip/knee (ankle not assessed per NWB precautions). He required MINA with standing from EOB with crutches and bed elevated. Pt is non-compliant with RLE NWB precautions requiring frequent cues throughout session. He ambulated ~100 ft with crutches requiring intermittent max assist for safety, pt required frequent cues to slow down and take smaller steps. He navigated 4 steps but required min - max assist for safety with crutches. Due to difficulty observing precautions with crutches and general safety opted to use a RW. He would benefit from physical therapy to improve gait and balance with LRAD, observe precautions and maximize his function by addressing the deficits listed. Following discharged he would benefit from continued physical therapy via HHPT.    Follow Up Recommendations Home health PT    Equipment Recommendations  Rolling walker with 5" wheels    Recommendations for Other Services       Precautions / Restrictions Precautions Precautions: None Restrictions Weight Bearing Restrictions: Yes RLE Weight Bearing: Non weight bearing      Mobility  Bed Mobility Overal bed mobility: Independent             General bed mobility comments: pt required cues to avoid putting weight through the RLE  Transfers Overall transfer level: Needs assistance Equipment  used: Crutches             General transfer comment: pt required MODA with sit to stand using crutches and with bed elevated and required verbal cues to avoid putting weight through the RLE  Ambulation/Gait Ambulation/Gait assistance: Max assist Gait Distance (Feet): 100 Feet Assistive device: Crutches Gait Pattern/deviations: Step-to pattern;Staggering right;Leaning posteriorly     General Gait Details: pt demonstrated impulsive behavior attempting to move quickly but demonstrated significant postural sway required Max assist intermittently to avoid falling  Stairs Stairs: Yes Stairs assistance: Max assist Stair Management: With crutches Number of Stairs: 4 General stair comments: demonstration and pt verbalized understanding of proper technique but pt had required min A ascending steps, and max assist descending steps   Wheelchair Mobility    Modified Rankin (Stroke Patients Only)       Balance Overall balance assessment: Needs assistance         Standing balance support: Bilateral upper extremity supported Standing balance-Leahy Scale: Fair                               Pertinent Vitals/Pain Pain Assessment: 0-10 Pain Score: 0-No pain Pain Location: R foot Pain Intervention(s): Monitored during session    Home Living Family/patient expects to be discharged to:: Private residence Living Arrangements: Spouse/significant other Available Help at Discharge: Family Type of Home: Apartment Home Access: Ramped entrance     Home Layout: One level Home Equipment: None      Prior Function Level of Independence: Independent  Hand Dominance   Dominant Hand: Right    Extremity/Trunk Assessment   Upper Extremity Assessment Upper Extremity Assessment: Overall WFL for tasks assessed    Lower Extremity Assessment Lower Extremity Assessment: RLE deficits/detail;LLE deficits/detail RLE Deficits / Details: knee/ hip overall  strength 4/5. didn't assess foot/ ankle due to NWB precuations LLE Deficits / Details: 4+/5        Communication   Communication: No difficulties  Cognition Arousal/Alertness: Awake/alert Behavior During Therapy: WFL for tasks assessed/performed Overall Cognitive Status: Within Functional Limits for tasks assessed                                        General Comments      Exercises Other Exercises Other Exercises: navigated 1 set of stairs with crutches   Assessment/Plan    PT Assessment Patient needs continued PT services  PT Problem List Decreased strength;Decreased range of motion;Decreased activity tolerance;Decreased balance;Decreased mobility;Decreased knowledge of use of DME;Decreased safety awareness;Pain       PT Treatment Interventions DME instruction;Gait training;Stair training;Therapeutic activities;Functional mobility training;Therapeutic exercise    PT Goals (Current goals can be found in the Care Plan section)  Acute Rehab PT Goals Patient Stated Goal: to get home PT Goal Formulation: With patient Time For Goal Achievement: 11/14/18 Potential to Achieve Goals: Good    Frequency Min 2X/week   Barriers to discharge        Co-evaluation               AM-PAC PT "6 Clicks" Mobility  Outcome Measure Help needed turning from your back to your side while in a flat bed without using bedrails?: A Little Help needed moving from lying on your back to sitting on the side of a flat bed without using bedrails?: A Little Help needed moving to and from a bed to a chair (including a wheelchair)?: A Lot Help needed standing up from a chair using your arms (e.g., wheelchair or bedside chair)?: A Lot Help needed to walk in hospital room?: A Lot Help needed climbing 3-5 steps with a railing? : Total 6 Click Score: 13    End of Session Equipment Utilized During Treatment: Gait belt Activity Tolerance: Patient limited by fatigue;Patient limited  by lethargy;Patient tolerated treatment well Patient left: in chair;with call bell/phone within reach;with family/visitor present Nurse Communication: Mobility status PT Visit Diagnosis: Unsteadiness on feet (R26.81);Other abnormalities of gait and mobility (R26.89);Muscle weakness (generalized) (M62.81)    Time: 8889-1694 PT Time Calculation (min) (ACUTE ONLY): 30 min   Charges:   PT Evaluation $PT Eval Moderate Complexity: 1 Mod PT Treatments $Gait Training: 8-22 mins $Therapeutic Exercise: 8-22 mins        Lovie Agresta PT, DPT, LAT, ATC  10/31/18  12:26 PM       Audrey Thull 10/31/2018, 12:18 PM

## 2018-10-31 NOTE — Anesthesia Postprocedure Evaluation (Signed)
Anesthesia Post Note  Patient: [name]  Procedure(s) Performed: IRRIGATION AND DEBRIDEMENT ABSCESS (Right )  Patient location during evaluation: PACU Anesthesia Type: General Level of consciousness: awake and alert Pain management: pain level controlled Vital Signs Assessment: post-procedure vital signs reviewed and stable Respiratory status: spontaneous breathing, nonlabored ventilation and respiratory function stable Cardiovascular status: blood pressure returned to baseline and stable Postop Assessment: no apparent nausea or vomiting Anesthetic complications: no     Last Vitals:  Vitals:   10/31/18 1437 10/31/18 1518  BP: (!) 158/88 (!) 160/89  Pulse: 87 87  Resp: 17 18  Temp: 37.7 C 37.2 C  SpO2: 98% 98%    Last Pain:  Vitals:   10/31/18 1518  TempSrc: Oral  PainSc:                  Durenda Hurt

## 2018-10-31 NOTE — Transfer of Care (Signed)
Immediate Anesthesia Transfer of Care Note  Patient: [name]  Procedure(s) Performed: IRRIGATION AND DEBRIDEMENT ABSCESS (Right )  Patient Location: PACU  Anesthesia Type:General  Level of Consciousness: awake, alert  and oriented  Airway & Oxygen Therapy: Patient Spontanous Breathing and Patient connected to nasal cannula oxygen  Post-op Assessment: Report given to RN and Post -op Vital signs reviewed and stable  Post vital signs: Reviewed and stable  Last Vitals:  Vitals Value Taken Time  BP    Temp    Pulse    Resp    SpO2      Last Pain:  Vitals:   10/31/18 0715  TempSrc: Oral  PainSc:       Patients Stated Pain Goal: 1 (65/78/46 9629)  Complications: No apparent anesthesia complications

## 2018-11-01 ENCOUNTER — Encounter: Payer: Self-pay | Admitting: Podiatry

## 2018-11-01 DIAGNOSIS — Z89421 Acquired absence of other right toe(s): Secondary | ICD-10-CM

## 2018-11-01 DIAGNOSIS — L03115 Cellulitis of right lower limb: Secondary | ICD-10-CM

## 2018-11-01 DIAGNOSIS — M869 Osteomyelitis, unspecified: Secondary | ICD-10-CM

## 2018-11-01 DIAGNOSIS — E1169 Type 2 diabetes mellitus with other specified complication: Secondary | ICD-10-CM

## 2018-11-01 DIAGNOSIS — L97519 Non-pressure chronic ulcer of other part of right foot with unspecified severity: Secondary | ICD-10-CM

## 2018-11-01 DIAGNOSIS — E11621 Type 2 diabetes mellitus with foot ulcer: Secondary | ICD-10-CM

## 2018-11-01 DIAGNOSIS — I10 Essential (primary) hypertension: Secondary | ICD-10-CM

## 2018-11-01 DIAGNOSIS — Z9114 Patient's other noncompliance with medication regimen: Secondary | ICD-10-CM

## 2018-11-01 LAB — CBC
HCT: 42.4 % (ref 39.0–52.0)
Hemoglobin: 14.3 g/dL (ref 13.0–17.0)
MCH: 31.2 pg (ref 26.0–34.0)
MCHC: 33.7 g/dL (ref 30.0–36.0)
MCV: 92.4 fL (ref 80.0–100.0)
Platelets: 203 10*3/uL (ref 150–400)
RBC: 4.59 MIL/uL (ref 4.22–5.81)
RDW: 11.4 % — ABNORMAL LOW (ref 11.5–15.5)
WBC: 6.8 10*3/uL (ref 4.0–10.5)
nRBC: 0 % (ref 0.0–0.2)

## 2018-11-01 LAB — BASIC METABOLIC PANEL
Anion gap: 3 — ABNORMAL LOW (ref 5–15)
BUN: 7 mg/dL (ref 6–20)
CO2: 30 mmol/L (ref 22–32)
Calcium: 10.5 mg/dL — ABNORMAL HIGH (ref 8.9–10.3)
Chloride: 102 mmol/L (ref 98–111)
Creatinine, Ser: 0.8 mg/dL (ref 0.61–1.24)
GFR calc Af Amer: >60 mL/min (ref >60)
GFR calc non Af Amer: >60 mL/min (ref >60)
Glucose, Bld: 199 mg/dL — ABNORMAL HIGH (ref 70–99)
Potassium: 4.3 mmol/L (ref 3.5–5.1)
Sodium: 135 mmol/L (ref 135–145)

## 2018-11-01 LAB — GLUCOSE, CAPILLARY
Glucose-Capillary: 196 mg/dL — ABNORMAL HIGH (ref 70–99)
Glucose-Capillary: 231 mg/dL — ABNORMAL HIGH (ref 70–99)
Glucose-Capillary: 158 mg/dL — ABNORMAL HIGH (ref 70–99)
Glucose-Capillary: 224 mg/dL — ABNORMAL HIGH (ref 70–99)

## 2018-11-01 MED ORDER — METRONIDAZOLE 500 MG PO TABS
500.0000 mg | ORAL_TABLET | Freq: Two times a day (BID) | ORAL | Status: DC
  Administered 2018-11-01 – 2018-11-03 (×4): 500 mg via ORAL
  Filled 2018-11-01 (×4): qty 1

## 2018-11-01 MED ORDER — SODIUM CHLORIDE 0.9 % IV SOLN
2.0000 g | INTRAVENOUS | Status: DC
  Administered 2018-11-01 – 2018-11-03 (×3): 2 g via INTRAVENOUS
  Filled 2018-11-01: qty 20
  Filled 2018-11-01 (×3): qty 2

## 2018-11-01 NOTE — Consult Note (Signed)
NAME: [name]  DOB: [birth year]  MRN: [id number]  Date/Time: 11/01/2018 1:14 PM  Dr. Vickki Muff Subjective:  REASON FOR CONSULT: Diabetic foot infection ? [name] is a 53 y.o. male with a history of diabetes mellitus, hypertension presents to the ED with swelling of the right foot with pain and open wound of several days duration.   He had a callus on his right foot which he tried to pull out few weeks ago and it became a hole.  He was treating that with topical new for him.  2 days before his presentation it became quickly worse and exploded with pain foul-smelling discharge and redness.  As he was unable to walk he came to the ED.  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  In the ED his vitals were 141/88, temperature of 98.9, heart rate of 89.  WBC was 10.1, hemoglobin of 16, creatinine of 0.81, blood glucose of 178. MRI showed Subcutaneous abscess in the dorsal soft tissue of the lateral foot extending from the mid diaphysis of the fifth metatarsal to the mid aspect of the proximal phalanx of the fifth toe.  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 Blood cultures were sent and he was started on vancomycin and Zosyn.  He was seen by Dr. Vickki Muff podiatrist.  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  Bone for culture and pathology has been sent. I am asked to see the patient for antibiotic management. Past Medical History:  Diagnosis Date  . Diabetes ([place])   . Hypertension   Dog bite with infection of the right hand Past Surgical History:  Procedure Laterality Date  . IRRIGATION AND DEBRIDEMENT ABSCESS Right 10/31/2018   Procedure: IRRIGATION AND DEBRIDEMENT ABSCESS;  Surgeon: Samara Deist, DPM;  Location: [facility] ORS;  Service: Podiatry;  Laterality: Right;  Social history English professor Non-smoker Heavy  alcohol use as per patient he drinks rum  No illicit drug use Family History  Problem Relation Age of Onset  . CAD Father   . CAD Brother    No Known Allergies  ? Current Facility-Administered Medications  Medication Dose Route Frequency Provider Last Rate Last Dose  . acetaminophen (TYLENOL) tablet 650 mg  650 mg Oral Q6H PRN Samara Deist, DPM       Or  . acetaminophen (TYLENOL) suppository 650 mg  650 mg Rectal Q6H PRN Samara Deist, DPM      . hydrALAZINE (APRESOLINE) injection 5 mg  5 mg Intravenous Q4H PRN Mayo, Pete Pelt, MD      . insulin aspart (novoLOG) injection 0-9 Units  0-9 Units Subcutaneous TID WC Samara Deist, DPM   3 Units at 11/01/18 1203  . insulin glargine (LANTUS) injection 20 Units  20 Units Subcutaneous Daily Samara Deist, DPM   20 Units at 11/01/18 [phone]  . ketorolac (TORADOL) 30 MG/ML injection 30 mg  30 mg Intravenous Q6H PRN Samara Deist, DPM      . lisinopril (PRINIVIL,ZESTRIL) tablet 5 mg  5 mg Oral Daily Samara Deist, DPM   5 mg at 11/01/18 0946  . morphine 2 MG/ML injection 2 mg  2 mg Intravenous Q3H PRN Samara Deist, DPM      . omega-3 acid ethyl esters (LOVAZA) capsule 1 g  1 g Oral Shauna Hugh, DPM   1 g at 11/01/18 0947  .  ondansetron (ZOFRAN) tablet 4 mg  4 mg Oral Q6H PRN Samara Deist, DPM       Or  . ondansetron [facility]) injection 4 mg  4 mg Intravenous Q6H PRN Samara Deist, DPM      . oxyCODONE (Oxy IR/ROXICODONE) immediate release tablet 5 mg  5 mg Oral Q4H PRN Samara Deist, DPM   5 mg at 10/31/18 2108  . piperacillin-tazobactam (ZOSYN) IVPB 3.375 g  3.375 g Intravenous Q8H Samara Deist, DPM 12.5 mL/hr at 11/01/18 0948 3.375 g at 11/01/18 0948  . polyethylene glycol (MIRALAX / GLYCOLAX) packet 17 g  17 g Oral Daily PRN Samara Deist, DPM      . vancomycin (VANCOCIN) 2,000 mg in sodium chloride 0.9 % 500 mL IVPB  2,000 mg Intravenous Q12H Samara Deist, DPM 250 mL/hr at 11/01/18 0457 2,000 mg at 11/01/18 0457      Abtx:  Anti-infectives (From admission, onward)   Start     Dose/Rate Route Frequency Ordered Stop   10/31/18 0400  vancomycin (VANCOCIN) 2,000 mg in sodium chloride 0.9 % 500 mL IVPB     2,000 mg 250 mL/hr over 120 Minutes Intravenous Every 12 hours 10/30/18 1428     10/31/18 0100  piperacillin-tazobactam (ZOSYN) IVPB 3.375 g     3.375 g 12.5 mL/hr over 240 Minutes Intravenous Every 8 hours 10/30/18 1428     10/30/18 1430  vancomycin (VANCOCIN) 1,500 mg in sodium chloride 0.9 % 500 mL IVPB     1,500 mg 250 mL/hr over 120 Minutes Intravenous  Once 10/30/18 1423 10/30/18 1926   10/30/18 1400  piperacillin-tazobactam (ZOSYN) IVPB 4.5 g  Status:  Discontinued     4.5 g 200 mL/hr over 30 Minutes Intravenous  Once 10/30/18 1317 10/30/18 1319   10/30/18 1400  piperacillin-tazobactam (ZOSYN) IVPB 4.5 g  Status:  Discontinued     4.5 g 200 mL/hr over 30 Minutes Intravenous  Once 10/30/18 1319 10/30/18 1322   10/30/18 1330  piperacillin-tazobactam (ZOSYN) IVPB 4.5 g  Status:  Discontinued     4.5 g 200 mL/hr over 30 Minutes Intravenous  Once 10/30/18 1241 10/30/18 1319   10/30/18 1330  piperacillin-tazobactam (ZOSYN) 4.5 g in dextrose 5 % 50 mL IVPB     4.5 g 100 mL/hr over 30 Minutes Intravenous  Once 10/30/18 1322 10/30/18 1650   10/30/18 1245  vancomycin (VANCOCIN) IVPB 1000 mg/200 mL premix     1,000 mg 200 mL/hr over 60 Minutes Intravenous  Once 10/30/18 1241 10/30/18 1423      REVIEW OF SYSTEMS:  Const: negative fever, negative chills, negative weight loss Eyes: negative diplopia or visual changes, negative eye pain ENT: negative coryza, negative sore throat Resp: negative cough, hemoptysis, dyspnea Cards: negative for chest pain, palpitations, lower extremity edema GU: negative for frequency, dysuria and hematuria GI: Negative for abdominal pain, diarrhea, bleeding, constipation Skin: negative for rash and pruritus Heme: negative for easy bruising and gum/nose bleeding MS:  negative for myalgias, arthralgias, back pain and muscle weakness Neurolo:negative for headaches, dizziness, vertigo, memory problems  Psych: negative for feelings of anxiety, depression  Endocrine: Has polyuria and polydipsia.  Used to take glipizide.  . Allergy/Immunology- negative for any medication or food allergies ? Objective:  VITALS:  BP (!) 158/95 (BP Location: Left Arm)   Pulse 72   Temp 98.2 F (36.8 C) (Oral)   Resp 18   Ht 6\' 3"  (1.905 m)   Wt 106.6 kg   SpO2 98%   BMI 29.37  kg/m  PHYSICAL EXAM:  General: Alert, cooperative, no distress, appears stated age.  Head: Normocephalic, without obvious abnormality, atraumatic. Eyes: Conjunctivae clear, anicteric sclerae. Pupils are equal ENT Nares normal. No drainage or sinus tenderness. Lips, mucosa, and tongue normal. No Thrush Neck: Supple, symmetrical, no adenopathy, thyroid: non tender no carotid bruit and no JVD. Back: No CVA tenderness. Lungs: Clear to auscultation bilaterally. No Wheezing or Rhonchi. No rales. Heart: Regular rate and rhythm, no murmur, rub or gallop. Abdomen: Soft, non-tender,not distended. Bowel sounds normal. No masses Extremities:rt foot dressing not removed Pre-op picture     Skin: No rashes or lesions. Or bruising Lymph: Cervical, supraclavicular normal. Neurologic: Grossly non-focal Pertinent Labs Lab Results CBC    Component Value Date/Time   WBC 6.8 11/01/2018 0415   RBC 4.59 11/01/2018 0415   HGB 14.3 11/01/2018 0415   HCT 42.4 11/01/2018 0415   PLT 203 11/01/2018 0415   MCV 92.4 11/01/2018 0415   MCH 31.2 11/01/2018 0415   MCHC 33.7 11/01/2018 0415   RDW 11.4 (L) 11/01/2018 0415   LYMPHSABS 1.6 10/30/2018 1224   MONOABS 1.2 (H) 10/30/2018 1224   EOSABS 0.2 10/30/2018 1224   BASOSABS 0.0 10/30/2018 1224    CMP Latest Ref Rng & Units 11/01/2018 10/30/2018 04/13/2018  Glucose 70 - 99 mg/dL 199(H) 178(H) 168(H)  BUN 6 - 20 mg/dL 7 11 12   Creatinine 0.61 - 1.24 mg/dL 0.80  0.81 0.61  Sodium 135 - 145 mmol/L 135 133(L) 136  Potassium 3.5 - 5.1 mmol/L 4.3 3.8 3.9  Chloride 98 - 111 mmol/L 102 100 102  CO2 22 - 32 mmol/L 30 25 26   Calcium 8.9 - 10.3 mg/dL 10.5(H) 10.9(H) 11.2(H)      Microbiology: Recent Results (from the past 240 hour(s))  Culture, blood (routine x 2)     Status: None (Preliminary result)   Collection Time: 10/30/18 12:20 PM  Result Value Ref Range Status   Specimen Description BLOOD BLOOD LEFT FOREARM  Final   Special Requests   Final    BOTTLES DRAWN AEROBIC AND ANAEROBIC Blood Culture adequate volume   Culture   Final    NO GROWTH 2 DAYS Performed at [facility], [address], [place], [place] [address]    Report Status PENDING  Incomplete  Culture, blood (routine x 2)     Status: None (Preliminary result)   Collection Time: 10/30/18 12:25 PM  Result Value Ref Range Status   Specimen Description BLOOD BLOOD RIGHT HAND  Final   Special Requests   Final    BOTTLES DRAWN AEROBIC AND ANAEROBIC Blood Culture adequate volume   Culture   Final    NO GROWTH 2 DAYS Performed at [facility], [address] [place], [place] [address]    Report Status PENDING  Incomplete  Surgical PCR screen     Status: None   Collection Time: 10/30/18  6:33 PM  Result Value Ref Range Status   MRSA, PCR NEGATIVE NEGATIVE Final   Staphylococcus aureus NEGATIVE NEGATIVE Final    Comment: (NOTE) The Xpert SA Assay (FDA approved for NASAL specimens in patients 19 years of age and older), is one component of a comprehensive surveillance program. It is not intended to diagnose infection nor to guide or monitor treatment. Performed at [facility], [address] [place], [place] [address]   Aerobic/Anaerobic Culture (surgical/deep wound)     Status: None (Preliminary result)   Collection Time: 10/31/18  8:53 AM  Result  Value Ref Range Status   Specimen Description   Final    BONE RIGHT 5TH METATARSAL Performed at  [facility], [address] [place], [place] [address]    Special Requests   Final    NONE Performed at [facility], [name] [place], [place] [address]    Gram Stain   Final    NO WBC SEEN RARE GRAM POSITIVE COCCI RARE GRAM POSITIVE RODS Performed at [facility], [place] [address], [place],  [address]    Culture PENDING  Incomplete   Report Status PENDING  Incomplete   IMAGING RESULTS: Intense cellulitis about the foot is worst laterally. There is a subcutaneous abscess in the dorsal soft tissues of the lateral foot extending from the mid diaphysis of the fifth metatarsal to the mid aspect of the proximal phalanx of the fifth toe. ? Impression/Recommendation ?53 year old male with history of hypertension diabetes noncompliant with meds  Diabetic foot infection with severe cellulitis and abscess of the right foot with picture suggestive of necrotizing infection.  Status post partial fifth ray amputation . ?Cultures pending. Currently on Vanco and Zosyn.  We will change the Zosyn to ceftriaxone and Flagyl. Spoke to Dr. Cleda Mccreedy who is going to take him back for surgery very likely tomorrow as he has more purulent discharge from the surgical site. ? _Hypertension: Started on lisinopril  Diabetes mellitus Hemoglobin A1c less than 6 started on Lantus.  Hypercalcemia patient has had increased calcium for the last 2 years may have to look into this further.  __________________________________________________ Discussed with patient and podiatrist Note:  This document was prepared using Dragon voice recognition software and may include unintentional dictation errors.

## 2018-11-01 NOTE — Progress Notes (Signed)
Inpatient Diabetes Program Recommendations  AACE/ADA: New Consensus Statement on Inpatient Glycemic Control (2015)  Target Ranges:  Prepandial:   less than 140 mg/dL      Peak postprandial:   less than 180 mg/dL (1-2 hours)      Critically ill patients:  140 - 180 mg/dL   Lab Results  Component Value Date   GLUCAP 196 (H) 11/01/2018   HGBA1C 5.7 (H) 04/13/2018    Review of Glycemic Control Results for [name], [name] (MRN [id number]) as of 11/01/2018 11:30  Ref. Range 10/31/2018 09:44 10/31/2018 10:32 10/31/2018 16:33 10/31/2018 21:22 11/01/2018 07:50  Glucose-Capillary Latest Ref Range: 70 - 99 mg/dL 191 (H) 172 (H) 207 (H) 257 (H) 196 (H)   Diabetes history: DM 2 Outpatient Diabetes medications:  Glipizide 5 mg daily Current orders for Inpatient glycemic control:  Lantus 20 units daily, Novolog sensitive tid with meals  Inpatient Diabetes Program Recommendations:    It appears that patient did not receive Lantus on 10/31/18 due to being in surgery.  Please consider adding A1C to current labs.   Thanks  Adah Perl, RN, BC-ADM Inpatient Diabetes Coordinator Pager [phone] (8a-5p)

## 2018-11-01 NOTE — Plan of Care (Signed)

## 2018-11-01 NOTE — Progress Notes (Signed)
1 Day Post-Op   Subjective/Chief Complaint: Patient seen.  No significant complaints of pain.   Objective: Vital signs in last 24 hours: Temp:  [96.8 F (36 C)-99.8 F (37.7 C)] 98.2 F (36.8 C) (02/03 0752) Pulse Rate:  [69-87] 72 (02/03 0752) Resp:  [11-20] 18 (02/03 0752) BP: (132-162)/(86-96) 158/95 (02/03 0752) SpO2:  [94 %-100 %] 98 % (02/03 0752) Last BM Date: 10/30/18  Intake/Output from previous day: 02/02 0701 - 02/03 0700 In: 2770 [P.O.:820; I.V.:850; IV Piggyback:1100] Out: 1240 [Urine:1220; Blood:20] Intake/Output this shift: Total I/O In: -  Out: 100 [Urine:100]  Moderate to heavy bleeding on the bandage.  A small amount of purulent drainage noted on the bandaging.  Still some significant erythema and edema in the right foot.  Some drainage is expressed from the proximal aspect of the incision area.  Lab Results:  Recent Labs    10/30/18 1224 11/01/18 0415  WBC 10.1 6.8  HGB 16.0 14.3  HCT 45.9 42.4  PLT 226 203   BMET Recent Labs    10/30/18 1224 11/01/18 0415  NA 133* 135  K 3.8 4.3  CL 100 102  CO2 25 30  GLUCOSE 178* 199*  BUN 11 7  CREATININE 0.81 0.80  CALCIUM 10.9* 10.5*   PT/INR No results for input(s): LABPROT, INR in the last 72 hours. ABG No results for input(s): PHART, HCO3 in the last 72 hours.  Invalid input(s): PCO2, PO2  Studies/Results: Mr Foot Right Wo Contrast  Result Date: 10/30/2018 CLINICAL DATA:  Infection about the lateral aspect of the right foot since the patient removed a callus 3-4 weeks ago. The patient is diabetic. EXAM: MRI OF THE RIGHT FOREFOOT WITHOUT CONTRAST TECHNIQUE: Multiplanar, multisequence MR imaging of the right forefoot was performed. No intravenous contrast was administered. COMPARISON:  Plain films right foot earlier today. FINDINGS: Bones/Joint/Cartilage There is marrow edema in the distal 4 cm of the fifth metatarsal which is most intense in the head and neck of the fifth metatarsal. Marrow  edema is also seen throughout the proximal phalanx the little toe. Minimal marrow edema in the middle and distal phalanges of the little toe is identified. Bone marrow signal is otherwise normal. Small fifth MTP joint effusion is noted. Ligaments Intact. Muscles and Tendons There is atrophy of intrinsic musculature of the. No intramuscular fluid collection. Soft tissues Intense subcutaneous edema is present about the foot. There is an air and fluid collection in the dorsal subcutaneous tissues which extends from the mid diaphysis the fifth metatarsal to the mid aspect of the proximal phalanx of the little toe. The collection measures up to 5.3 cm long by 1 cm craniocaudal by 3 cm transverse and is consistent with abscess. IMPRESSION: Intense cellulitis about the foot is worst laterally. There is a subcutaneous abscess in the dorsal soft tissues of the lateral foot extending from the mid diaphysis of the fifth metatarsal to the mid aspect of the proximal phalanx of the fifth toe. 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 Marrow signal abnormality in the distal 4 cm of the fifth metatarsal is worst in the head and neck where is consistent with osteomyelitis. More proximally, signal abnormality could be due to osteomyelitis or reactive change. Mild edema in the middle and distal phalanges of the fifth toe is likely reactive. Electronically Signed   By: Inge Rise M.D.   On: 10/30/2018 15:18   Dg Foot Complete Right  Result  Date: 10/30/2018 CLINICAL DATA:  Right foot ulcer. EXAM: RIGHT FOOT COMPLETE - 3+ VIEW COMPARISON:  None. FINDINGS: There is no evidence of fracture or dislocation. There is no evidence of arthropathy. Soft tissue swelling and gas is noted lateral to the distal fifth metatarsal as well as around the fifth metatarsophalangeal joint concerning for open wound and associated cellulitis. Some lucency is seen projected over  the distal head of the fifth metatarsal which could represent osteomyelitis. IMPRESSION: Probable open wound and associated osteomyelitis seen in the region of the fifth metatarsophalangeal joint and soft tissues lateral to distal fifth metatarsal. Some lucency is seen projected over distal head of fifth metatarsal which could represent osteomyelitis, or overlying soft tissue artifact. MRI is recommended for further evaluation. Electronically Signed   By: Marijo Conception, M.D.   On: 10/30/2018 12:18    Anti-infectives: Anti-infectives (From admission, onward)   Start     Dose/Rate Route Frequency Ordered Stop   10/31/18 0400  vancomycin (VANCOCIN) 2,000 mg in sodium chloride 0.9 % 500 mL IVPB     2,000 mg 250 mL/hr over 120 Minutes Intravenous Every 12 hours 10/30/18 1428     10/31/18 0100  piperacillin-tazobactam (ZOSYN) IVPB 3.375 g     3.375 g 12.5 mL/hr over 240 Minutes Intravenous Every 8 hours 10/30/18 1428     10/30/18 1430  vancomycin (VANCOCIN) 1,500 mg in sodium chloride 0.9 % 500 mL IVPB     1,500 mg 250 mL/hr over 120 Minutes Intravenous  Once 10/30/18 1423 10/30/18 1926   10/30/18 1400  piperacillin-tazobactam (ZOSYN) IVPB 4.5 g  Status:  Discontinued     4.5 g 200 mL/hr over 30 Minutes Intravenous  Once 10/30/18 1317 10/30/18 1319   10/30/18 1400  piperacillin-tazobactam (ZOSYN) IVPB 4.5 g  Status:  Discontinued     4.5 g 200 mL/hr over 30 Minutes Intravenous  Once 10/30/18 1319 10/30/18 1322   10/30/18 1330  piperacillin-tazobactam (ZOSYN) IVPB 4.5 g  Status:  Discontinued     4.5 g 200 mL/hr over 30 Minutes Intravenous  Once 10/30/18 1241 10/30/18 1319   10/30/18 1330  piperacillin-tazobactam (ZOSYN) 4.5 g in dextrose 5 % 50 mL IVPB     4.5 g 100 mL/hr over 30 Minutes Intravenous  Once 10/30/18 1322 10/30/18 1650   10/30/18 1245  vancomycin (VANCOCIN) IVPB 1000 mg/200 mL premix     1,000 mg 200 mL/hr over 60 Minutes Intravenous  Once 10/30/18 1241 10/30/18 1423       Assessment/Plan: s/p Procedure(s): IRRIGATION AND DEBRIDEMENT ABSCESS (Right) Assessment: Status post debridement and amputation right foot.   Plan: Betadine and a sterile dressing reapplied to the right foot.  Discussed with the patient that we may need to go back in for a second irrigation and debridement if there continues to appear to be some infection.  At this point we will plan to reassess the wound tomorrow  LOS: 2 days    Durward Fortes 11/01/2018

## 2018-11-01 NOTE — Care Management Note (Signed)
Case Management Note  Patient Details  Name: [name] MRN: [id number] Date of Birth: [birth year]  Subjective/Objective:                  Met with patient to discuss DC Plan He does not have a RW and needs on also needs a 3 in 1 Notified Jason with [facility] of the need Patient provided with the [facility] list per CMS.gov and he has chosen [facility] for [facility] nursing and PT Notified Corene Cornea with [facility] of the choice Patient lives alone, Patient has a ramp that goes into the home Patient has PCP at [facility] clinic Patient uses [facility] and can afford medications Patient has transportation and no problems No other needs identified   Action/Plan: [place] list provided notified [facility] of choice Ordered 3 in 1 and RW    Expected Discharge Date:  11/01/18               Expected Discharge Plan:     In-House Referral:     Discharge planning Services  CM Consult  Post Acute Care Choice:  Durable Medical Equipment Choice offered to:     DME Arranged:  3-N-1, [place] rolling DME Agency:  [place]:  [place] Agency:  [place]  Status of Service:  Completed, signed off  If discussed at [place] of Stay Meetings, dates discussed:    Additional Comments:  Su Hilt, RN 11/01/2018, 1:39 PM

## 2018-11-01 NOTE — Anesthesia Preprocedure Evaluation (Deleted)
Anesthesia Evaluation    Airway        Dental   Pulmonary Current Smoker,           Cardiovascular hypertension,      Neuro/Psych    GI/Hepatic   Endo/Other  diabetes  Renal/GU      Musculoskeletal   Abdominal   Peds  Hematology   Anesthesia Other Findings Past Medical History: No date: Diabetes ([place]) No date: Hypertension   Reproductive/Obstetrics                                                              Anesthesia Evaluation  Patient identified by MRN, date of birth, ID band Patient awake    Reviewed: Allergy & Precautions, H&P , NPO status , Patient's Chart, lab work & pertinent test results  Airway Mallampati: II       Dental  (+) Teeth Intact   Pulmonary Current Smoker,           Cardiovascular hypertension,      Neuro/Psych negative neurological ROS  negative psych ROS   GI/Hepatic negative GI ROS, Neg liver ROS,   Endo/Other  diabetes  Renal/GU      Musculoskeletal   Abdominal   Peds  Hematology negative hematology ROS (+)   Anesthesia Other Findings Past Medical History: No date: Diabetes ([place]) No date: Hypertension  History reviewed. No pertinent surgical history.  BMI    Body Mass Index:  29.37 kg/m      Reproductive/Obstetrics negative OB ROS                            Anesthesia Physical Anesthesia Plan  ASA: III  Anesthesia Plan: General   Post-op Pain Management:    Induction:   PONV Risk Score and Plan: Ondansetron and Midazolam  Airway Management Planned:   Additional Equipment:   Intra-op Plan:   Post-operative Plan:   Informed Consent: I have reviewed the patients History and Physical, chart, labs and discussed the procedure including the risks, benefits and alternatives for the proposed anesthesia with the patient or authorized representative who has indicated his/her  understanding and acceptance.     Dental Advisory Given  Plan Discussed with: Anesthesiologist and CRNA  Anesthesia Plan Comments:        Anesthesia Quick Evaluation  Anesthesia Physical Anesthesia Plan Anesthesia Quick Evaluation

## 2018-11-01 NOTE — Progress Notes (Signed)
[place] [facility] [place] NAME: [name]    MR#:  [id number]  DATE OF BIRTH:  08-08-1966  SUBJECTIVE:   States he is only having mild right foot pain this morning.  He denies any fevers or chills.  He is not having any issues eating or drinking.  REVIEW OF SYSTEMS:  Review of Systems  Constitutional: Negative for chills and fever.  HENT: Negative for congestion and sore throat.   Eyes: Negative for blurred vision.  Respiratory: Negative for cough and shortness of breath.   Cardiovascular: Negative for chest pain, palpitations and leg swelling.  Gastrointestinal: Negative for nausea and vomiting.  Genitourinary: Negative for dysuria and urgency.  Musculoskeletal: Negative for back pain and neck pain.  Neurological: Negative for dizziness and headaches.  Psychiatric/Behavioral: Negative for depression. The patient is not nervous/anxious.     DRUG ALLERGIES:  No Known Allergies VITALS:  Blood pressure (!) 158/95, pulse 72, temperature 98.2 F (36.8 C), temperature source Oral, resp. rate 18, height 6\' 3"  (1.905 m), weight 106.6 kg, SpO2 98 %. PHYSICAL EXAMINATION:  Physical Exam  GENERAL:  53 y.o. patient lying in the bed with no acute distress.  EYES: Pupils equal, round, reactive to light and accommodation. No scleral icterus. Extraocular muscles intact.  HEENT: Head atraumatic, normocephalic. Oropharynx and nasopharynx clear.  Moist mucous membranes. NECK:  Supple, no jugular venous distention. No thyroid enlargement, no tenderness.  LUNGS: Normal breath sounds bilaterally, no wheezing, rales,rhonchi or crepitation. No use of accessory muscles of respiration.  CARDIOVASCULAR: RRR, S1, S2 normal. No murmurs, rubs, or gallops.  ABDOMEN: Soft, nontender, nondistended. Bowel sounds present. No organomegaly or mass.  EXTREMITIES: Dry bandage in place over right foot.  No edema of the left foot.  Lower extremities are warm to the touch.  No  cyanosis or clubbing. NEUROLOGIC: Cranial nerves II through XII are intact. Muscle strength 5/5 in all extremities. Sensation intact. Gait not checked.  PSYCHIATRIC: The patient is alert and oriented x 3.  SKIN: No obvious rash, lesion, or ulcer.  LABORATORY PANEL:  Male CBC Recent Labs  Lab 11/01/18 0415  WBC 6.8  HGB 14.3  HCT 42.4  PLT 203   ------------------------------------------------------------------------------------------------------------------ Chemistries  Recent Labs  Lab 11/01/18 0415  NA 135  K 4.3  CL 102  CO2 30  GLUCOSE 199*  BUN 7  CREATININE 0.80  CALCIUM 10.5*   RADIOLOGY:  No results found. ASSESSMENT AND PLAN:   1. 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 -Podiatry following- may have to do second I&D tomorrow -Continue IV Vanco and Zosyn -ID consult -Blood cultures with no growth to date -Follow-up intraoperative cultures -Pain control -PT recommended home health PT  2. Type II diabetes- blood sugars mildly elevated, likely due to infection -Continue sliding scale and Lantus -Check A1c  3. Hypertension- BPs mildly elevated -Continue lisinopril -Add IV hydralazine PRN  4. DVT prophylaxis-- SCDs  All the records are reviewed and case discussed with Care Management/Social Worker. Management plans discussed with the patient, family and they are in agreement.  CODE STATUS: Full Code  TOTAL TIME TAKING CARE OF THIS PATIENT: 35 minutes.   More than 50% of the time was spent in counseling/coordination of care: YES  POSSIBLE D/C IN 2-3 DAYS, DEPENDING ON CLINICAL CONDITION.   Berna Spare [name] M.D on 11/01/2018 at 3:35 PM  Between 7am to  6pm - Pager - [phone]  After 6pm go to [email or web address] - Proofreader  [facility]  Office  [phone]  CC: Primary care physician; Valera Castle,  MD  Note: This dictation was prepared with Dragon dictation along with smaller phrase technology. Any transcriptional errors that result from this process are unintentional.

## 2018-11-02 ENCOUNTER — Encounter: Payer: Self-pay | Admitting: Anesthesiology

## 2018-11-02 ENCOUNTER — Inpatient Hospital Stay: Payer: BC Managed Care – PPO | Admitting: Anesthesiology

## 2018-11-02 ENCOUNTER — Encounter
Admission: EM | Disposition: A | Discharge status: Home Health | Payer: Self-pay | Source: Home / Self Care | Attending: Internal Medicine

## 2018-11-02 HISTORY — PX: IRRIGATION AND DEBRIDEMENT FOOT: SHX6602

## 2018-11-02 LAB — CBC
HCT: 43.2 % (ref 39.0–52.0)
HEMOGLOBIN: 14.8 g/dL (ref 13.0–17.0)
MCH: 30.9 pg (ref 26.0–34.0)
MCHC: 34.3 g/dL (ref 30.0–36.0)
MCV: 90.2 fL (ref 80.0–100.0)
Platelets: 210 10*3/uL (ref 150–400)
RBC: 4.79 MIL/uL (ref 4.22–5.81)
RDW: 11.3 % — ABNORMAL LOW (ref 11.5–15.5)
WBC: 6.7 10*3/uL (ref 4.0–10.5)
nRBC: 0 % (ref 0.0–0.2)

## 2018-11-02 LAB — SURGICAL PATHOLOGY

## 2018-11-02 LAB — BASIC METABOLIC PANEL
Anion gap: 5 (ref 5–15)
BUN: 6 mg/dL (ref 6–20)
CO2: 26 mmol/L (ref 22–32)
Calcium: 10.7 mg/dL — ABNORMAL HIGH (ref 8.9–10.3)
Chloride: 105 mmol/L (ref 98–111)
Creatinine, Ser: 0.63 mg/dL (ref 0.61–1.24)
GFR calc Af Amer: >60 mL/min (ref >60)
GFR calc non Af Amer: >60 mL/min (ref >60)
GLUCOSE: 219 mg/dL — AB (ref 70–99)
Potassium: 3.8 mmol/L (ref 3.5–5.1)
Sodium: 136 mmol/L (ref 135–145)

## 2018-11-02 LAB — GLUCOSE, CAPILLARY
GLUCOSE-CAPILLARY: 185 mg/dL — AB (ref 70–99)
GLUCOSE-CAPILLARY: 190 mg/dL — AB (ref 70–99)
Glucose-Capillary: 208 mg/dL — ABNORMAL HIGH (ref 70–99)
Glucose-Capillary: 180 mg/dL — ABNORMAL HIGH (ref 70–99)
Glucose-Capillary: 181 mg/dL — ABNORMAL HIGH (ref 70–99)
Glucose-Capillary: 252 mg/dL — ABNORMAL HIGH (ref 70–99)

## 2018-11-02 LAB — HEMOGLOBIN A1C
Hgb A1c MFr Bld: 7.8 % — ABNORMAL HIGH (ref 4.8–5.6)
Mean Plasma Glucose: 177.16 mg/dL

## 2018-11-02 SURGERY — IRRIGATION AND DEBRIDEMENT FOOT
Anesthesia: General | Laterality: Right

## 2018-11-02 MED ORDER — LIDOCAINE HCL (CARDIAC) PF 100 MG/5ML IV SOSY
PREFILLED_SYRINGE | INTRAVENOUS | Status: DC | PRN
  Administered 2018-11-02: 100 mg via INTRAVENOUS

## 2018-11-02 MED ORDER — SEVOFLURANE IN SOLN
RESPIRATORY_TRACT | Status: AC
  Filled 2018-11-02: qty 250

## 2018-11-02 MED ORDER — SODIUM CHLORIDE 0.9 % IV SOLN
INTRAVENOUS | Status: DC | PRN
  Administered 2018-11-02: 13:00:00 via INTRAVENOUS

## 2018-11-02 MED ORDER — VANCOMYCIN HCL 1000 MG IV SOLR
INTRAVENOUS | Status: DC | PRN
  Administered 2018-11-02: 1000 mg

## 2018-11-02 MED ORDER — KETOROLAC TROMETHAMINE 30 MG/ML IJ SOLN: 30.0000 mg | Freq: Four times a day (QID) | INTRAMUSCULAR | Status: AC | PRN

## 2018-11-02 MED ORDER — FENTANYL CITRATE (PF) 100 MCG/2ML IJ SOLN
INTRAMUSCULAR | Status: AC
  Filled 2018-11-02: qty 2

## 2018-11-02 MED ORDER — LISINOPRIL 10 MG PO TABS
10.0000 mg | ORAL_TABLET | Freq: Every day | ORAL | Status: DC
  Administered 2018-11-03: 10 mg via ORAL
  Filled 2018-11-02: qty 1

## 2018-11-02 MED ORDER — PHENYLEPHRINE HCL 10 MG/ML IJ SOLN
INTRAMUSCULAR | Status: AC
  Filled 2018-11-02: qty 1

## 2018-11-02 MED ORDER — ONDANSETRON HCL 4 MG/2ML IJ SOLN
INTRAMUSCULAR | Status: DC | PRN
  Administered 2018-11-02: 4 mg via INTRAVENOUS

## 2018-11-02 MED ORDER — BUPIVACAINE HCL (PF) 0.5 % IJ SOLN
INTRAMUSCULAR | Status: DC | PRN
  Administered 2018-11-02: 10 mL

## 2018-11-02 MED ORDER — MIDAZOLAM HCL 2 MG/2ML IJ SOLN
INTRAMUSCULAR | Status: DC | PRN
  Administered 2018-11-02: 2 mg via INTRAVENOUS

## 2018-11-02 MED ORDER — AMLODIPINE BESYLATE 10 MG PO TABS
10.0000 mg | ORAL_TABLET | Freq: Every day | ORAL | Status: DC
  Administered 2018-11-03 – 2018-11-04 (×2): 10 mg via ORAL
  Filled 2018-11-02 (×2): qty 1

## 2018-11-02 MED ORDER — MIDAZOLAM HCL 2 MG/2ML IJ SOLN
INTRAMUSCULAR | Status: AC
  Filled 2018-11-02: qty 2

## 2018-11-02 MED ORDER — VANCOMYCIN HCL 1000 MG IV SOLR
INTRAVENOUS | Status: AC
  Filled 2018-11-02: qty 1000

## 2018-11-02 MED ORDER — SODIUM CHLORIDE FLUSH 0.9 % IV SOLN
INTRAVENOUS | Status: AC
  Filled 2018-11-02: qty 10

## 2018-11-02 MED ORDER — PROPOFOL 10 MG/ML IV BOLUS
INTRAVENOUS | Status: DC | PRN
  Administered 2018-11-02: 200 mg via INTRAVENOUS
  Administered 2018-11-02: 50 mg via INTRAVENOUS

## 2018-11-02 MED ORDER — ONDANSETRON HCL 4 MG/2ML IJ SOLN
INTRAMUSCULAR | Status: AC
  Filled 2018-11-02: qty 2

## 2018-11-02 MED ORDER — FENTANYL CITRATE (PF) 100 MCG/2ML IJ SOLN
INTRAMUSCULAR | Status: DC | PRN
  Administered 2018-11-02 (×2): 50 ug via INTRAVENOUS

## 2018-11-02 MED ORDER — PHENYLEPHRINE HCL 10 MG/ML IJ SOLN
INTRAMUSCULAR | Status: DC | PRN
  Administered 2018-11-02 (×3): 100 ug via INTRAVENOUS

## 2018-11-02 SURGICAL SUPPLY — 51 items
"PENCIL ELECTRO HAND CTR " (MISCELLANEOUS) ×1 IMPLANT
BANDAGE ELASTIC 4 LF NS (GAUZE/BANDAGES/DRESSINGS) ×2 IMPLANT
BLADE OSCILLATING/SAGITTAL (BLADE)
BLADE SURG 15 STRL LF DISP TIS (BLADE) ×1 IMPLANT
BLADE SURG 15 STRL SS (BLADE) ×1
BLADE SW THK.38XMED LNG THN (BLADE) IMPLANT
BNDG CONFORM 2 STRL LF (GAUZE/BANDAGES/DRESSINGS) ×2 IMPLANT
BNDG ESMARK 4X12 TAN STRL LF (GAUZE/BANDAGES/DRESSINGS) ×2 IMPLANT
BNDG GAUZE 4.5X4.1 6PLY STRL (MISCELLANEOUS) ×2 IMPLANT
CANISTER SUCT 1200ML W/VALVE (MISCELLANEOUS) ×2 IMPLANT
COVER WAND RF STERILE (DRAPES) ×1 IMPLANT
CUFF TOURN 18 STER (MISCELLANEOUS) ×2 IMPLANT
CUFF TOURN DUAL PL 12 NO SLV (MISCELLANEOUS) ×1 IMPLANT
DRAPE FLUOR MINI C-ARM 54X84 (DRAPES) IMPLANT
DURAPREP 26ML APPLICATOR (WOUND CARE) ×2 IMPLANT
ELECT REM PT RETURN 9FT ADLT (ELECTROSURGICAL) ×2
ELECTRODE REM PT RTRN 9FT ADLT (ELECTROSURGICAL) ×1 IMPLANT
GAUZE PETRO XEROFOAM 1X8 (MISCELLANEOUS) ×2 IMPLANT
GAUZE SPONGE 4X4 12PLY STRL (GAUZE/BANDAGES/DRESSINGS) ×2 IMPLANT
GLOVE BIO SURGEON STRL SZ7.5 (GLOVE) ×2 IMPLANT
GLOVE INDICATOR 8.0 STRL GRN (GLOVE) ×2 IMPLANT
GOWN STRL REUS W/ TWL LRG LVL3 (GOWN DISPOSABLE) ×2 IMPLANT
GOWN STRL REUS W/TWL LRG LVL3 (GOWN DISPOSABLE) ×2
HANDPIECE INTERPULSE COAX TIP (DISPOSABLE) ×1
HANDPIECE VERSAJET DEBRIDEMENT (MISCELLANEOUS) ×2 IMPLANT
KIT STIMULAN RAPID CURE 5CC (Orthopedic Implant) ×1 IMPLANT
LABEL OR SOLS (LABEL) ×1 IMPLANT
NDL FILTER BLUNT 18X1 1/2 (NEEDLE) ×1 IMPLANT
NDL HYPO 25X1 1.5 SAFETY (NEEDLE) ×3 IMPLANT
NEEDLE FILTER BLUNT 18X 1/2SAF (NEEDLE) ×1
NEEDLE FILTER BLUNT 18X1 1/2 (NEEDLE) ×1 IMPLANT
NEEDLE HYPO 25X1 1.5 SAFETY (NEEDLE) ×6 IMPLANT
NS IRRIG 500ML POUR BTL (IV SOLUTION) ×2 IMPLANT
PACK EXTREMITY ARMC (MISCELLANEOUS) ×2 IMPLANT
PAD ABD DERMACEA PRESS 5X9 (GAUZE/BANDAGES/DRESSINGS) ×4 IMPLANT
PENCIL ELECTRO HAND CTR (MISCELLANEOUS) ×2 IMPLANT
RASP SM TEAR CROSS CUT (RASP) IMPLANT
SET HNDPC FAN SPRY TIP SCT (DISPOSABLE) IMPLANT
SOL PREP PVP 2OZ (MISCELLANEOUS) ×2
SOLUTION PREP PVP 2OZ (MISCELLANEOUS) ×1 IMPLANT
STOCKINETTE 48X4 2 PLY STRL (GAUZE/BANDAGES/DRESSINGS) ×1 IMPLANT
STOCKINETTE STRL 4IN 9604848 (GAUZE/BANDAGES/DRESSINGS) IMPLANT
STOCKINETTE STRL 6IN 960660 (GAUZE/BANDAGES/DRESSINGS) ×2 IMPLANT
SUT ETHILON 4-0 (SUTURE) ×1
SUT ETHILON 4-0 FS2 18XMFL BLK (SUTURE) ×1
SUT VIC AB 3-0 SH 27 (SUTURE) ×1
SUT VIC AB 3-0 SH 27X BRD (SUTURE) ×1 IMPLANT
SUT VIC AB 4-0 FS2 27 (SUTURE) ×2 IMPLANT
SUTURE ETHLN 4-0 FS2 18XMF BLK (SUTURE) ×1 IMPLANT
SYR 10ML LL (SYRINGE) ×4 IMPLANT
SYR 3ML LL SCALE MARK (SYRINGE) ×2 IMPLANT

## 2018-11-02 NOTE — Consult Note (Signed)
Pharmacy Antibiotic Note  [name] is a 53 y.o. male admitted on 10/30/2018 with osteomyelitis. 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 Pharmacy has been consulted for Vancomycin dosing.  Plan: ID discontinued Vancomycin.  Height: 6\' 3"  (190.5 cm) Weight: 235 lb (106.6 kg) IBW/kg (Calculated) : 84.5  Temp (24hrs), Avg:97.8 F (36.6 C), Min:97.2 F (36.2 C), Max:98.8 F (37.1 C)  Recent Labs  Lab 10/30/18 1146 10/30/18 1224 11/01/18 0415 11/02/18 0316  WBC  --  10.1 6.8 6.7  CREATININE  --  0.81 0.80 0.63  LATICACIDVEN 1.9  --   --   --     Estimated Creatinine Clearance: 142.5 mL/min (by C-G formula based on SCr of 0.63 mg/dL).    No Known Allergies  Antimicrobials this admission: Vancomycin 2/1 >> 2/4 Ceftriaxone 2/3 >> Flagyl 2/3 >> Zosyn 2/1 >> 2/3  Dose adjustments this admission: N/A  Microbiology results: 2/1 BCx: NGTD 2/2 Wound Cx (5th R bone metatarsal) rare GPCs/GPRs 2/1 MRSA PCR (-)  Thank you for allowing pharmacy to be a part of this patient's care.   Paticia Stack, PharmD Pharmacy Resident  11/02/2018 3:41 PM

## 2018-11-02 NOTE — Progress Notes (Signed)
[place] [facility] [place] NAME: [name]    MR#:  [id number]  DATE OF BIRTH:  [birth year]  SUBJECTIVE:   Doing fine this morning.  States that his right foot does not hurt that bad.  Denies any fevers or chills.  No concerns this morning.  He is thinking he will probably be taken for surgery again today.  REVIEW OF SYSTEMS:  Review of Systems  Constitutional: Negative for chills and fever.  HENT: Negative for congestion and sore throat.   Eyes: Negative for blurred vision.  Respiratory: Negative for cough and shortness of breath.   Cardiovascular: Negative for chest pain, palpitations and leg swelling.  Gastrointestinal: Negative for nausea and vomiting.  Genitourinary: Negative for dysuria and urgency.  Musculoskeletal: Negative for back pain and neck pain.  Neurological: Negative for dizziness and headaches.  Psychiatric/Behavioral: Negative for depression. The patient is not nervous/anxious.     DRUG ALLERGIES:  No Known Allergies VITALS:  Blood pressure (!) 158/91, pulse 83, temperature (!) 97.5 F (36.4 C), resp. rate (!) 21, height 6\' 3"  (1.905 m), weight 106.6 kg, SpO2 99 %. PHYSICAL EXAMINATION:  Physical Exam  GENERAL:  53 y.o. patient lying in the bed with no acute distress.  EYES: Pupils equal, round, reactive to light and accommodation. No scleral icterus. Extraocular muscles intact.  HEENT: Head atraumatic, normocephalic. Oropharynx and nasopharynx clear.  Moist mucous membranes. NECK:  Supple, no jugular venous distention. No thyroid enlargement, no tenderness.  LUNGS: Normal breath sounds bilaterally, no wheezing, rales,rhonchi or crepitation. No use of accessory muscles of respiration.  CARDIOVASCULAR: RRR, S1, S2 normal. No murmurs, rubs, or gallops.  ABDOMEN: Soft, nontender, nondistended. Bowel sounds present. No organomegaly or mass.  EXTREMITIES: Dry bandage in place over right foot.  No edema of the left foot.   Lower extremities are warm to the touch.  No cyanosis or clubbing. NEUROLOGIC: Cranial nerves II through XII are intact. Muscle strength 5/5 in all extremities. Sensation intact. Gait not checked.  PSYCHIATRIC: The patient is alert and oriented x 53.  SKIN: No obvious rash, lesion, or ulcer.  LABORATORY PANEL:  Male CBC Recent Labs  Lab 11/02/18 0316  WBC 6.7  HGB 14.8  HCT 43.2  PLT 210   ------------------------------------------------------------------------------------------------------------------ Chemistries  Recent Labs  Lab 11/02/18 0316  NA 136  K 3.8  CL 105  CO2 26  GLUCOSE 219*  BUN 6  CREATININE 0.63  CALCIUM 10.7*   RADIOLOGY:  No results found. ASSESSMENT AND PLAN:   1. 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 -Podiatry following-and for second debridement today -ID consulted-antibiotics switched to vancomycin, ceftriaxone, and flagyl -Blood cultures with no growth to date -Intraoperative cultures 2/2 growing moderate group B strep -Pain control -PT recommended home health PT  2. Type II diabetes- blood sugars mildly elevated, likely due to infection -Continue sliding scale and Lantus -A1c 7.8 this admission  3. Hypertension- BPs remain elevated -Increase lisinopril dose to 10 mg daily -Add Norvasc 10 mg daily -Add IV hydralazine PRN  4. DVT prophylaxis-- SCDs  All the records are reviewed and case discussed with Care Management/Social Worker. Management plans discussed with the patient, family and they are in agreement.  CODE STATUS: Full Code  TOTAL TIME TAKING CARE OF THIS PATIENT: 35 minutes.   More than 50% of the time was spent in counseling/coordination of care: YES  POSSIBLE D/C IN 2-3 DAYS, DEPENDING ON CLINICAL CONDITION.   Berna Spare Mayo M.D on 11/02/2018 at 2:44 PM  Between 7am to 6pm - Pager [phone]  After 6pm go to  [email or web address] - Proofreader  [facility]  Office  [phone]  CC: Primary care physician; Valera Castle, MD  Note: This dictation was prepared with Dragon dictation along with smaller phrase technology. Any transcriptional errors that result from this process are unintentional.

## 2018-11-02 NOTE — Op Note (Signed)
Date of operation: 11/02/2018.  Surgeon: Durward Fortes D.P.M.  Preoperative diagnosis: Osteomyelitis right foot status post ray resection with continued infection.  Postoperative diagnosis: Same.  Procedure: Repeat I&D right foot.  Anesthesia: LMA.  Hemostasis: Pneumatic tourniquet right ankle 250 mmHg.  Estimated blood loss: Less than 5 cc.  Injectables: 10 cc 0.5% bupivacaine plain.  Implants: Stimulan rapid cure antibiotic beads impregnated with vancomycin.  Complications: None apparent.  Operative indications: This is a 53 year old male diabetic with recent ulceration and osteomyelitis in his right foot.  Underwent ray resection and debridement.  Was noted to have some continued purulence and infection and decision was made for repeat I&D of the right foot.  Operative procedure: Patient was taken to the operating room and placed on the table in the supine position.  Following satisfactory LMA anesthesia a pneumatic tourniquet was applied at the level of the right ankle and the foot was prepped and draped in the usual sterile fashion.  Sutures were removed from the incision on the right foot.  There was noted to be some focal areas of purulence along the dorsal aspect as well as plantar and distal.  Significant bleeding was noted and decision was made to exsanguinate the foot and inflate the tourniquet.     Attention was then directed back to the wound where the abscess dorsally were probed bluntly using finger dissection as well as plantarly.  Using a versa jet debrider on a setting of 3 the soft tissues and abscessed areas were thoroughly debrided and irrigated.  The cut end of the fifth metatarsal was noted to be solid and in good shape and was left intact.  The wound was then thoroughly irrigated with pulse lavage irrigation.  Proximal aspect of the incision was closed using 3-0 nylon simple interrupted sutures and then Stimulan rapid cure antibiotic beads were then placed in to the  wound both dorsal and plantar and along the fourth metatarsal area.  The remainder of the incision was closed using 3-0 nylon simple interrupted sutures.  A couple of small areas were left open that could not be reapproximated.  10 cc of 0.5% bupivacaine plain were then injected around the surgical site followed by application of Xeroform 4 x 4's, ABDs and a Kerlix.  Tourniquet was released.  Second Kerlix and Ace wrap were then applied to the right lower extremity.  Patient was awakened and transported to the PACU with vital signs stable and in good condition.

## 2018-11-02 NOTE — H&P (View-Only) (Signed)
2 Days Post-Op   Subjective/Chief Complaint: Patient seen.  Not really complaining of any pain.   Objective: Vital signs in last 24 hours: Temp:  [97.7 F (36.5 C)-98.8 F (37.1 C)] 98.3 F (36.8 C) (02/04 0738) Pulse Rate:  [77-83] 77 (02/04 0738) Resp:  [16-18] 18 (02/04 0738) BP: (166-198)/(95-106) 166/96 (02/04 0738) SpO2:  [96 %-100 %] 96 % (02/04 0738) Last BM Date: 10/30/18  Intake/Output from previous day: 02/03 0701 - 02/04 0700 In: 928 [P.O.:240; IV Piggyback:688] Out: 3575 [Urine:3575] Intake/Output this shift: No intake/output data recorded.  Moderate drainage still noted on the bandaging.  Upon removal there is still some significant erythema and edema in the right foot with expressible purulence from the incision and wound area.  Lab Results:  Recent Labs    11/01/18 0415 11/02/18 0316  WBC 6.8 6.7  HGB 14.3 14.8  HCT 42.4 43.2  PLT 203 210   BMET Recent Labs    11/01/18 0415 11/02/18 0316  NA 135 136  K 4.3 3.8  CL 102 105  CO2 30 26  GLUCOSE 199* 219*  BUN 7 6  CREATININE 0.80 0.63  CALCIUM 10.5* 10.7*   PT/INR No results for input(s): LABPROT, INR in the last 72 hours. ABG No results for input(s): PHART, HCO3 in the last 72 hours.  Invalid input(s): PCO2, PO2  Studies/Results: No results found.  Anti-infectives: Anti-infectives (From admission, onward)   Start     Dose/Rate Route Frequency Ordered Stop   11/01/18 2200  metroNIDAZOLE (FLAGYL) tablet 500 mg     500 mg Oral Every 12 hours 11/01/18 1559     11/01/18 1800  cefTRIAXone (ROCEPHIN) 2 g in sodium chloride 0.9 % 100 mL IVPB     2 g 200 mL/hr over 30 Minutes Intravenous Every 24 hours 11/01/18 1559     10/31/18 0400  vancomycin (VANCOCIN) 2,000 mg in sodium chloride 0.9 % 500 mL IVPB     2,000 mg 250 mL/hr over 120 Minutes Intravenous Every 12 hours 10/30/18 1428     10/31/18 0100  piperacillin-tazobactam (ZOSYN) IVPB 3.375 g  Status:  Discontinued     3.375 g 12.5 mL/hr  over 240 Minutes Intravenous Every 8 hours 10/30/18 1428 11/01/18 1558   10/30/18 1430  vancomycin (VANCOCIN) 1,500 mg in sodium chloride 0.9 % 500 mL IVPB     1,500 mg 250 mL/hr over 120 Minutes Intravenous  Once 10/30/18 1423 10/30/18 1926   10/30/18 1400  piperacillin-tazobactam (ZOSYN) IVPB 4.5 g  Status:  Discontinued     4.5 g 200 mL/hr over 30 Minutes Intravenous  Once 10/30/18 1317 10/30/18 1319   10/30/18 1400  piperacillin-tazobactam (ZOSYN) IVPB 4.5 g  Status:  Discontinued     4.5 g 200 mL/hr over 30 Minutes Intravenous  Once 10/30/18 1319 10/30/18 1322   10/30/18 1330  piperacillin-tazobactam (ZOSYN) IVPB 4.5 g  Status:  Discontinued     4.5 g 200 mL/hr over 30 Minutes Intravenous  Once 10/30/18 1241 10/30/18 1319   10/30/18 1330  piperacillin-tazobactam (ZOSYN) 4.5 g in dextrose 5 % 50 mL IVPB     4.5 g 100 mL/hr over 30 Minutes Intravenous  Once 10/30/18 1322 10/30/18 1650   10/30/18 1245  vancomycin (VANCOCIN) IVPB 1000 mg/200 mL premix     1,000 mg 200 mL/hr over 60 Minutes Intravenous  Once 10/30/18 1241 10/30/18 1423      Assessment/Plan: s/p Procedure(s): IRRIGATION AND DEBRIDEMENT ABSCESS (Right) Assessment: Continued infection status post ray resection  right foot.   Plan: Dressing reapplied to the right foot.  Discussed with the patient we need to continue with the plan for a second debridement later today.  Continue n.p.o.  Plan for surgery early afternoon.  We did discuss possible risks and complications of surgery including inability to heal due to the infection or his diabetes.  Discussed that this could require follow-up debridements.  Questions invited and answered.  Consents to surgery for this afternoon.  LOS: 3 days    Durward Fortes 11/02/2018

## 2018-11-02 NOTE — Progress Notes (Signed)
PT Cancellation Note  Patient Details Name: [name] MRN: [id number] DOB: [birth year]   Cancelled Treatment:    Reason Eval/Treat Not Completed: Other (comment)   Pt awaiting debridement procedure this pm - pt reports "around noon."  Declined session at this time.  Will check for new orders and continue as appropriate tomorrow.   [name] 11/02/2018, 11:27 AM

## 2018-11-02 NOTE — Interval H&P Note (Signed)
History and Physical Interval Note:  11/02/2018 12:04 PM  [name]  has presented today for surgery, with the diagnosis of Osteomyolitis right foot  The various methods of treatment have been discussed with the patient and family. After consideration of risks, benefits and other options for treatment, the patient has consented to  Procedure(s): IRRIGATION AND DEBRIDEMENT FOOT (Right) as a surgical intervention .  The patient's history has been reviewed, patient examined, no change in status, stable for surgery.  I have reviewed the patient's chart and labs.  Questions were answered to the patient's satisfaction.     Durward Fortes

## 2018-11-02 NOTE — Anesthesia Post-op Follow-up Note (Signed)
Anesthesia QCDR form completed.        

## 2018-11-02 NOTE — Anesthesia Procedure Notes (Signed)
Procedure Name: Intubation Date/Time: 11/02/2018 12:40 PM Performed by: Aline Brochure, CRNA Pre-anesthesia Checklist: Patient identified, Emergency Drugs available, Suction available and Patient being monitored Patient Re-evaluated:Patient Re-evaluated prior to induction Oxygen Delivery Method: Circle system utilized Preoxygenation: Pre-oxygenation with 100% oxygen Induction Type: IV induction Ventilation: Mask ventilation without difficulty LMA: LMA inserted LMA Size: 4.5 Number of attempts: 1 Placement Confirmation: positive ETCO2 and breath sounds checked- equal and bilateral Tube secured with: Tape Dental Injury: Teeth and Oropharynx as per pre-operative assessment

## 2018-11-02 NOTE — Anesthesia Preprocedure Evaluation (Signed)
Anesthesia Evaluation  Patient identified by MRN, date of birth, ID band Patient awake    Reviewed: Allergy & Precautions, H&P , NPO status , Patient's Chart, lab work & pertinent test results  Airway Mallampati: II       Dental  (+) Teeth Intact   Pulmonary Current Smoker,           Cardiovascular hypertension,      Neuro/Psych negative neurological ROS  negative psych ROS   GI/Hepatic negative GI ROS, Neg liver ROS,   Endo/Other  diabetes  Renal/GU      Musculoskeletal   Abdominal   Peds  Hematology negative hematology ROS (+)   Anesthesia Other Findings Past Medical History: No date: Diabetes ([place]) No date: Hypertension  History reviewed. No pertinent surgical history.  BMI    Body Mass Index:  29.37 kg/m      Reproductive/Obstetrics negative OB ROS                             Anesthesia Physical  Anesthesia Plan  ASA: III  Anesthesia Plan: General   Post-op Pain Management:    Induction:   PONV Risk Score and Plan: Ondansetron, Midazolam and Dexamethasone  Airway Management Planned:   Additional Equipment:   Intra-op Plan:   Post-operative Plan:   Informed Consent: I have reviewed the patients History and Physical, chart, labs and discussed the procedure including the risks, benefits and alternatives for the proposed anesthesia with the patient or authorized representative who has indicated his/her understanding and acceptance.     Dental Advisory Given  Plan Discussed with: Anesthesiologist and CRNA  Anesthesia Plan Comments:         Anesthesia Quick Evaluation

## 2018-11-02 NOTE — Transfer of Care (Signed)
Immediate Anesthesia Transfer of Care Note  Patient: [name]  Procedure(s) Performed: IRRIGATION AND DEBRIDEMENT FOOT (Right )  Patient Location: PACU  Anesthesia Type:General  Level of Consciousness: sedated  Airway & Oxygen Therapy: Patient connected to face mask oxygen  Post-op Assessment: Post -op Vital signs reviewed and stable  Post vital signs: stable  Last Vitals:  Vitals Value Taken Time  BP 123/84 11/02/2018  1:37 PM  Temp    Pulse 71 11/02/2018  1:37 PM  Resp 21 11/02/2018  1:37 PM  SpO2 100 % 11/02/2018  1:37 PM    Last Pain:  Vitals:   11/02/18 1226  TempSrc: Oral  PainSc: 2       Patients Stated Pain Goal: 2 (67/42/55 2589)  Complications: No apparent anesthesia complications

## 2018-11-02 NOTE — Progress Notes (Signed)
2 Days Post-Op   Subjective/Chief Complaint: Patient seen.  Not really complaining of any pain.   Objective: Vital signs in last 24 hours: Temp:  [97.7 F (36.5 C)-98.8 F (37.1 C)] 98.3 F (36.8 C) (02/04 0738) Pulse Rate:  [77-83] 77 (02/04 0738) Resp:  [16-18] 18 (02/04 0738) BP: (166-198)/(95-106) 166/96 (02/04 0738) SpO2:  [96 %-100 %] 96 % (02/04 0738) Last BM Date: 10/30/18  Intake/Output from previous day: 02/03 0701 - 02/04 0700 In: 928 [P.O.:240; IV Piggyback:688] Out: 3575 [Urine:3575] Intake/Output this shift: No intake/output data recorded.  Moderate drainage still noted on the bandaging.  Upon removal there is still some significant erythema and edema in the right foot with expressible purulence from the incision and wound area.  Lab Results:  Recent Labs    11/01/18 0415 11/02/18 0316  WBC 6.8 6.7  HGB 14.3 14.8  HCT 42.4 43.2  PLT 203 210   BMET Recent Labs    11/01/18 0415 11/02/18 0316  NA 135 136  K 4.3 3.8  CL 102 105  CO2 30 26  GLUCOSE 199* 219*  BUN 7 6  CREATININE 0.80 0.63  CALCIUM 10.5* 10.7*   PT/INR No results for input(s): LABPROT, INR in the last 72 hours. ABG No results for input(s): PHART, HCO3 in the last 72 hours.  Invalid input(s): PCO2, PO2  Studies/Results: No results found.  Anti-infectives: Anti-infectives (From admission, onward)   Start     Dose/Rate Route Frequency Ordered Stop   11/01/18 2200  metroNIDAZOLE (FLAGYL) tablet 500 mg     500 mg Oral Every 12 hours 11/01/18 1559     11/01/18 1800  cefTRIAXone (ROCEPHIN) 2 g in sodium chloride 0.9 % 100 mL IVPB     2 g 200 mL/hr over 30 Minutes Intravenous Every 24 hours 11/01/18 1559     10/31/18 0400  vancomycin (VANCOCIN) 2,000 mg in sodium chloride 0.9 % 500 mL IVPB     2,000 mg 250 mL/hr over 120 Minutes Intravenous Every 12 hours 10/30/18 1428     10/31/18 0100  piperacillin-tazobactam (ZOSYN) IVPB 3.375 g  Status:  Discontinued     3.375 g 12.5 mL/hr  over 240 Minutes Intravenous Every 8 hours 10/30/18 1428 11/01/18 1558   10/30/18 1430  vancomycin (VANCOCIN) 1,500 mg in sodium chloride 0.9 % 500 mL IVPB     1,500 mg 250 mL/hr over 120 Minutes Intravenous  Once 10/30/18 1423 10/30/18 1926   10/30/18 1400  piperacillin-tazobactam (ZOSYN) IVPB 4.5 g  Status:  Discontinued     4.5 g 200 mL/hr over 30 Minutes Intravenous  Once 10/30/18 1317 10/30/18 1319   10/30/18 1400  piperacillin-tazobactam (ZOSYN) IVPB 4.5 g  Status:  Discontinued     4.5 g 200 mL/hr over 30 Minutes Intravenous  Once 10/30/18 1319 10/30/18 1322   10/30/18 1330  piperacillin-tazobactam (ZOSYN) IVPB 4.5 g  Status:  Discontinued     4.5 g 200 mL/hr over 30 Minutes Intravenous  Once 10/30/18 1241 10/30/18 1319   10/30/18 1330  piperacillin-tazobactam (ZOSYN) 4.5 g in dextrose 5 % 50 mL IVPB     4.5 g 100 mL/hr over 30 Minutes Intravenous  Once 10/30/18 1322 10/30/18 1650   10/30/18 1245  vancomycin (VANCOCIN) IVPB 1000 mg/200 mL premix     1,000 mg 200 mL/hr over 60 Minutes Intravenous  Once 10/30/18 1241 10/30/18 1423      Assessment/Plan: s/p Procedure(s): IRRIGATION AND DEBRIDEMENT ABSCESS (Right) Assessment: Continued infection status post ray resection  right foot.   Plan: Dressing reapplied to the right foot.  Discussed with the patient we need to continue with the plan for a second debridement later today.  Continue n.p.o.  Plan for surgery early afternoon.  We did discuss possible risks and complications of surgery including inability to heal due to the infection or his diabetes.  Discussed that this could require follow-up debridements.  Questions invited and answered.  Consents to surgery for this afternoon.  LOS: 3 days    Durward Fortes 11/02/2018

## 2018-11-03 ENCOUNTER — Inpatient Hospital Stay: Payer: Self-pay

## 2018-11-03 DIAGNOSIS — Z794 Long term (current) use of insulin: Secondary | ICD-10-CM

## 2018-11-03 DIAGNOSIS — B954 Other streptococcus as the cause of diseases classified elsewhere: Secondary | ICD-10-CM

## 2018-11-03 DIAGNOSIS — Z79899 Other long term (current) drug therapy: Secondary | ICD-10-CM

## 2018-11-03 DIAGNOSIS — L02611 Cutaneous abscess of right foot: Secondary | ICD-10-CM

## 2018-11-03 DIAGNOSIS — B9689 Other specified bacterial agents as the cause of diseases classified elsewhere: Secondary | ICD-10-CM

## 2018-11-03 LAB — CBC WITH DIFFERENTIAL/PLATELET
ABS IMMATURE GRANULOCYTES: 0.03 10*3/uL (ref 0.00–0.07)
Basophils Absolute: 0 10*3/uL (ref 0.0–0.1)
Basophils Relative: 0 %
Eosinophils Absolute: 0.2 10*3/uL (ref 0.0–0.5)
Eosinophils Relative: 4 %
HCT: 44.8 % (ref 39.0–52.0)
Hemoglobin: 15.2 g/dL (ref 13.0–17.0)
Immature Granulocytes: 0 %
Lymphocytes Relative: 21 %
Lymphs Abs: 1.5 10*3/uL (ref 0.7–4.0)
MCH: 31.1 pg (ref 26.0–34.0)
MCHC: 33.9 g/dL (ref 30.0–36.0)
MCV: 91.6 fL (ref 80.0–100.0)
MONO ABS: 0.8 10*3/uL (ref 0.1–1.0)
MONOS PCT: 12 %
NEUTROS ABS: 4.3 10*3/uL (ref 1.7–7.7)
Neutrophils Relative %: 63 %
Platelets: 232 10*3/uL (ref 150–400)
RBC: 4.89 MIL/uL (ref 4.22–5.81)
RDW: 11.3 % — ABNORMAL LOW (ref 11.5–15.5)
WBC: 6.9 10*3/uL (ref 4.0–10.5)
nRBC: 0 % (ref 0.0–0.2)

## 2018-11-03 LAB — GLUCOSE, CAPILLARY
Glucose-Capillary: 193 mg/dL — ABNORMAL HIGH (ref 70–99)
Glucose-Capillary: 267 mg/dL — ABNORMAL HIGH (ref 70–99)
Glucose-Capillary: 174 mg/dL — ABNORMAL HIGH (ref 70–99)
Glucose-Capillary: 237 mg/dL — ABNORMAL HIGH (ref 70–99)

## 2018-11-03 LAB — BASIC METABOLIC PANEL
Anion gap: 5 (ref 5–15)
BUN: 8 mg/dL (ref 6–20)
CO2: 27 mmol/L (ref 22–32)
Calcium: 10.9 mg/dL — ABNORMAL HIGH (ref 8.9–10.3)
Chloride: 104 mmol/L (ref 98–111)
Creatinine, Ser: 0.7 mg/dL (ref 0.61–1.24)
GFR calc Af Amer: >60 mL/min (ref >60)
GFR calc non Af Amer: >60 mL/min (ref >60)
Glucose, Bld: 212 mg/dL — ABNORMAL HIGH (ref 70–99)
Potassium: 4.4 mmol/L (ref 3.5–5.1)
SODIUM: 136 mmol/L (ref 135–145)

## 2018-11-03 MED ORDER — VANCOMYCIN HCL 10 G IV SOLR
2000.0000 mg | Freq: Once | INTRAVENOUS | Status: AC
  Administered 2018-11-03: 2000 mg via INTRAVENOUS
  Filled 2018-11-03: qty 2000

## 2018-11-03 MED ORDER — LISINOPRIL 20 MG PO TABS
20.0000 mg | ORAL_TABLET | Freq: Every day | ORAL | Status: DC
  Administered 2018-11-04: 20 mg via ORAL
  Filled 2018-11-03: qty 1

## 2018-11-03 NOTE — Evaluation (Addendum)
Physical Therapy Evaluation Patient Details Name: [name] MRN: [id number] DOB: [birth year] Today's Date: 11/03/2018   History of Present Illness  pt is a 53 y.o M admitted on 10/30/2018 with dx of osteomylitis. known history of diabetes, hypertension, and came into the ED with R foot pain and inability to bear weight that occurred from manipulating a callous that became infected. He was admitted the area was irrigated and debridged on 10/31/2018.  Clinical Impression  Pt demonstrated progress with walking distance, balance and management of WB status today.  He was much more steady with use of RW and use of post op shoe.  Pt open to education regarding use of RW, WB status and there ex.  Pt able to perform all there ex without difficulty or pain increase.  He completed 200+ ft of ambulation with RW without significant deviations.  Pt will continue to benefit from skilled PT with focus on gait, safe functional mobility and tolerance to activity. Pt appropriate for [facility] PT following discharge. addended 11/03/2018, 1316  Follow Up Recommendations Home health PT    Equipment Recommendations  Rolling walker with 5" wheels    Recommendations for Other Services       Precautions / Restrictions Restrictions Weight Bearing Restrictions: Yes RLE Weight Bearing: Non weight bearing      Mobility  Bed Mobility Overal bed mobility: Independent             General bed mobility comments: pt required cues to avoid putting weight through the RLE  Transfers Overall transfer level: Needs assistance Equipment used: Rolling walker (2 wheeled)             General transfer comment: Able to stand on L unilateral LE and gradually shift to R side with post op shoe donned.  VC's for use of RW.  Ambulation/Gait Ambulation/Gait assistance: Max assist Gait Distance (Feet): 250 Feet Assistive device: Rolling walker (2 wheeled) Gait Pattern/deviations: Step-to pattern;Staggering right;Leaning  posteriorly   Gait velocity interpretation: >2.62 ft/sec, indicative of community ambulatory General Gait Details: Pt much more steady on feet today with RW and able to manage heel WB with post-op shoe donned.  PT provided VC's for use of RW.  Stairs            Wheelchair Mobility    Modified Rankin (Stroke Patients Only)       Balance Overall balance assessment: Needs assistance   Sitting balance-Leahy Scale: Good     Standing balance support: Bilateral upper extremity supported Standing balance-Leahy Scale: Fair                               Pertinent Vitals/Pain Pain Assessment: No/denies pain    Home Living Family/patient expects to be discharged to:: Private residence Living Arrangements: Spouse/significant other Available Help at Discharge: Family Type of Home: Apartment Home Access: Ramped entrance     Home Layout: One level Home Equipment: None      Prior Function Level of Independence: Independent               Hand Dominance   Dominant Hand: Right    Extremity/Trunk Assessment        Lower Extremity Assessment Lower Extremity Assessment: (Reports good sensation return in feet.) RLE Deficits / Details: knee/ hip overall strength 4/5. didn't assess foot/ ankle due to NWB precuations LLE Deficits / Details: 4+/5     Cervical / Trunk Assessment Cervical / Trunk Assessment:  Normal  Communication   Communication: No difficulties  Cognition Arousal/Alertness: Awake/alert Behavior During Therapy: WFL for tasks assessed/performed Overall Cognitive Status: Within Functional Limits for tasks assessed                                        General Comments      Exercises Other Exercises Other Exercises: Supine quad sets, hip abduction, SLR and pillow squeeze x12 R LE   Assessment/Plan    PT Assessment Patient needs continued PT services  PT Problem List Decreased strength;Decreased range of  motion;Decreased activity tolerance;Decreased balance;Decreased mobility;Decreased knowledge of use of DME;Decreased safety awareness;Pain       PT Treatment Interventions DME instruction;Gait training;Stair training;Therapeutic activities;Functional mobility training;Therapeutic exercise    PT Goals (Current goals can be found in the Care Plan section)  Acute Rehab PT Goals Patient Stated Goal: to get home PT Goal Formulation: With patient Time For Goal Achievement: 11/28/18 Potential to Achieve Goals: Good    Frequency Min 2X/week   Barriers to discharge        Co-evaluation               AM-PAC PT "6 Clicks" Mobility  Outcome Measure Help needed turning from your back to your side while in a flat bed without using bedrails?: None Help needed moving from lying on your back to sitting on the side of a flat bed without using bedrails?: None Help needed moving to and from a bed to a chair (including a wheelchair)?: A Little Help needed standing up from a chair using your arms (e.g., wheelchair or bedside chair)?: A Little Help needed to walk in hospital room?: A Little Help needed climbing 3-5 steps with a railing? : A Lot 6 Click Score: 19    End of Session Equipment Utilized During Treatment: Gait belt(Post-op shoe) Activity Tolerance: Patient tolerated treatment well Patient left: in chair;with call bell/phone within reach;with nursing/sitter in room Nurse Communication: Mobility status PT Visit Diagnosis: Unsteadiness on feet (R26.81);Other abnormalities of gait and mobility (R26.89);Muscle weakness (generalized) (M62.81)    Time: 4356-8616 PT Time Calculation (min) (ACUTE ONLY): 23 min   Charges:   PT Evaluation $PT Re-evaluation: 1 Re-eval PT Treatments $Therapeutic Exercise: 8-22 mins $Therapeutic Activity: 8-22 mins        Roxanne Gates, PT, DPT   Roxanne Gates 11/03/2018, 1:08 PM, ADDENDED 11/03/2018, 8372

## 2018-11-03 NOTE — Consult Note (Signed)
Pharmacy Antibiotic Note  [name] is a 53 y.o. male admitted on 10/30/2018 with osteomyelitis. 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 Pharmacy has been consulted for Vancomycin dosing.  Plan: Vancomycin was discontinued 2/4. Today, S. Aureus is growing in wound Cx. Vancomycin 2 g IV x 1 ordered.  Height: 6\' 3"  (190.5 cm) Weight: 235 lb (106.6 kg) IBW/kg (Calculated) : 84.5  Temp (24hrs), Avg:98.5 F (36.9 C), Min:98.2 F (36.8 C), Max:99 F (37.2 C)  Recent Labs  Lab 10/30/18 1146 10/30/18 1224 11/01/18 0415 11/02/18 0316 11/03/18 0407  WBC  --  10.1 6.8 6.7 6.9  CREATININE  --  0.81 0.80 0.63 0.70  LATICACIDVEN 1.9  --   --   --   --     Estimated Creatinine Clearance: 142.5 mL/min (by C-G formula based on SCr of 0.7 mg/dL).    No Known Allergies  Antimicrobials this admission: Vancomycin 2/1 >> 2/4, 2/5 >> Ceftriaxone 2/3 >> Flagyl 2/3 >> Zosyn 2/1 >> 2/3  Dose adjustments this admission: N/A  Microbiology results: 2/1 BCx: NGTD 2/2 Wound Cx (5th R bone metatarsal)moderate group G strep, few s. aureus 2/1 MRSA PCR (-)  Thank you for allowing pharmacy to be a part of this patient's care.   Paticia Stack, PharmD Pharmacy Resident  11/03/2018 3:10 PM

## 2018-11-03 NOTE — Progress Notes (Signed)
   Date of Admission:  10/30/2018   Today 11/03/18  ID: [name] is a 53 y.o. male  Active Problems:   Osteomyelitis ([place]) Diabetic foot infection rt  Pt seen with Dr.Cline Subjective: Doing well No specific complaint Had wash out  yesterday  Medications:  . amLODipine  10 mg Oral Daily  . insulin aspart  0-9 Units Subcutaneous TID WC  . insulin glargine  20 Units Subcutaneous Daily  . lisinopril  10 mg Oral Daily  . metroNIDAZOLE  500 mg Oral Q12H  . omega-3 acid ethyl esters  1 g Oral QODAY    Objective: Vital signs in last 24 hours: Temp:  [98.2 F (36.8 C)-99 F (37.2 C)] 98.2 F (36.8 C) (02/05 1337) Pulse Rate:  [73-88] 86 (02/05 1337) Resp:  [18-20] 18 (02/05 1337) BP: (158-191)/(80-106) 165/96 (02/05 1337) SpO2:  [94 %-99 %] 97 % (02/05 1337)  PHYSICAL EXAM:  General: Alert, cooperative, no distress, appears stated age.  Lungs: Clear to auscultation bilaterally. No Wheezing or Rhonchi. No rales. Heart: Regular rate and rhythm, no murmur, rub or gallop. Abdomen: Soft, non-tender,not distended. Bowel sounds normal. No masses Extremities: rt foot    Pre surgery    Skin: No rashes or lesions. Or bruising Lymph: Cervical, supraclavicular normal. Neurologic: Grossly non-focal  Lab Results Recent Labs    11/02/18 0316 11/03/18 0407  WBC 6.7 6.9  HGB 14.8 15.2  HCT 43.2 44.8  NA 136 136  K 3.8 4.4  CL 105 104  CO2 26 27  BUN 6 8  CREATININE 0.63 0.70   Microbiology:  Studies/Results: [place] Ekg Site Rite  Result Date: 11/03/2018 If Site Rite image not attached, placement could not be confirmed due to current cardiac rhythm.    Assessment/Plan: 53 year old male with history of hypertension diabetes noncompliant with meds  Diabetic foot infection with severe cellulitis and abscess of the right foot with  necrotizing infection.  Status post partial fifth ray amputation . ?Cultures so far group C strep and staph and anerobes Currently on  ceftriaxone and Flagyl- will add vanco back till susceptibility available tomorrow.  Marland KitchenHe will need IV antibiotic for 4-6 weeks As bone is osteo and margin is not curative Will decide on final antibiotic once the sensitivity is available ? _Hypertension: Started on lisinopril  Diabetes mellitus on Lantus.  Hypercalcemia patient has had increased calcium for the last 2 years may have to look into this further as OP.  __________________________________________________ Discussed with patient and podiatrist

## 2018-11-03 NOTE — Progress Notes (Signed)
Peripherally Inserted Central Catheter/Midline Placement  The IV Nurse has discussed with the patient and/or persons authorized to consent for the patient, the purpose of this procedure and the potential benefits and risks involved with this procedure.  The benefits include less needle sticks, lab draws from the catheter, and the patient may be discharged home with the catheter. Risks include, but not limited to, infection, bleeding, blood clot (thrombus formation), and puncture of an artery; nerve damage and irregular heartbeat and possibility to perform a PICC exchange if needed/ordered by physician.  Alternatives to this procedure were also discussed.  Bard Power PICC patient education guide, fact sheet on infection prevention and patient information card has been provided to patient /or left at bedside.    PICC/Midline Placement Documentation  PICC Single Lumen 11/03/18 PICC Right Brachial 41 cm 0 cm (Active)  Indication for Insertion or Continuance of Line Home intravenous therapies (PICC only) 11/03/2018  3:00 PM  Exposed Catheter (cm) 0 cm 11/03/2018  3:00 PM  Site Assessment Clean;Dry;Intact 11/03/2018  3:00 PM  Line Status Flushed;Saline locked;Blood return noted 11/03/2018  3:00 PM  Dressing Type Transparent 11/03/2018  3:00 PM  Dressing Status Clean;Dry;Intact;Antimicrobial disc in place 11/03/2018  3:00 PM  Dressing Change Due 11/10/18 11/03/2018  3:00 PM       Gordan Payment 11/03/2018, 3:01 PM

## 2018-11-03 NOTE — Progress Notes (Signed)
[facility] [place] NAME: [name]    MR#:  [id number]  DATE OF BIRTH:  [birth year]  SUBJECTIVE:   Doing well this morning.  Denies any foot pain.  States he is ready to go home.  REVIEW OF SYSTEMS:  Review of Systems  Constitutional: Negative for chills and fever.  HENT: Negative for congestion and sore throat.   Eyes: Negative for blurred vision.  Respiratory: Negative for cough and shortness of breath.   Cardiovascular: Negative for chest pain, palpitations and leg swelling.  Gastrointestinal: Negative for nausea and vomiting.  Genitourinary: Negative for dysuria and urgency.  Musculoskeletal: Negative for back pain and neck pain.  Neurological: Negative for dizziness and headaches.  Psychiatric/Behavioral: Negative for depression. The patient is not nervous/anxious.     DRUG ALLERGIES:  No Known Allergies VITALS:  Blood pressure (!) 160/88, pulse 74, temperature 98.8 F (37.1 C), temperature source Oral, resp. rate 17, height 6\' 3"  (1.905 m), weight 106.6 kg, SpO2 99 %. PHYSICAL EXAMINATION:  Physical Exam  GENERAL:  53 y.o. patient lying in the bed with no acute distress.  EYES: Pupils equal, round, reactive to light and accommodation. No scleral icterus. Extraocular muscles intact.  HEENT: Head atraumatic, normocephalic. Oropharynx and nasopharynx clear.  Moist mucous membranes. NECK:  Supple, no jugular venous distention. No thyroid enlargement, no tenderness.  LUNGS: Normal breath sounds bilaterally, no wheezing, rales,rhonchi or crepitation. No use of accessory muscles of respiration.  CARDIOVASCULAR: RRR, S1, S2 normal. No murmurs, rubs, or gallops.  ABDOMEN: Soft, nontender, nondistended. Bowel sounds present. No organomegaly or mass.  EXTREMITIES: Dry bandage in place over right foot.  No edema of the left foot.  Lower extremities are warm to the touch.  No cyanosis or clubbing. NEUROLOGIC: Cranial nerves II through  XII are intact. Muscle strength 5/5 in all extremities. Sensation intact. Gait not checked.  PSYCHIATRIC: The patient is alert and oriented x 3.  SKIN: No obvious rash, lesion, or ulcer.  LABORATORY PANEL:  Male CBC Recent Labs  Lab 11/03/18 0407  WBC 6.9  HGB 15.2  HCT 44.8  PLT 232   ------------------------------------------------------------------------------------------------------------------ Chemistries  Recent Labs  Lab 11/03/18 0407  NA 136  K 4.4  CL 104  CO2 27  GLUCOSE 212*  BUN 8  CREATININE 0.70  CALCIUM 10.9*   RADIOLOGY:  [place] Ekg Site Rite  Result Date: 11/03/2018 If Site Rite image not attached, placement could not be confirmed due to current cardiac rhythm.  ASSESSMENT AND PLAN:   1. Right 5th MTP osteomyelitis/lateral foot cellulitis- s/p partial fifth ray amputation and I&D abscess of dorsal right foot 10/31/18 with podiatry. Repeat I&D 11/02/18. -Podiatry following -ID consulted- needs IV antibiotics for 6 weeks -Blood cultures with no growth to date -Intraoperative cultures 2/2 growing moderate group B strep, staph, and anaerobes -PICC line to be placed today -Pain control -PT recommended home health PT  2. Type II diabetes- blood sugars mildly elevated, likely due to infection -Continue sliding scale and Lantus -A1c 7.8 this admission  3. Hypertension- BPs remain elevated -Increase lisinopril dose to 20 mg daily -Continue Norvasc 10 mg daily -IV hydralazine PRN  4. DVT prophylaxis-- SCDs  All the records are reviewed and case discussed with Care Management/Social Worker. Management plans discussed with the patient, family and they are in agreement.  CODE STATUS: Full Code  TOTAL TIME TAKING CARE OF THIS PATIENT: 35 minutes.   More than 50% of the  time was spent in counseling/coordination of care: YES  POSSIBLE D/C tomorrow, DEPENDING ON CLINICAL CONDITION.   Berna Spare Mayo M.D on 11/03/2018 at 5:52 PM  Between 7am to 6pm - Pager [phone]  After 6pm go to [email or web address] - Proofreader  [facility]  Office  [phone]  CC: Primary care physician; Valera Castle, MD  Note: This dictation was prepared with Dragon dictation along with smaller phrase technology. Any transcriptional errors that result from this process are unintentional.

## 2018-11-03 NOTE — Progress Notes (Signed)
1 Day Post-Op   Subjective/Chief Complaint: Patient seen.  A little pain last night but manageable with medication and overall doing better today.   Objective: Vital signs in last 24 hours: Temp:  [97.2 F (36.2 C)-99 F (37.2 C)] 98.3 F (36.8 C) (02/05 0815) Pulse Rate:  [71-88] 73 (02/05 0815) Resp:  [11-21] 18 (02/05 0815) BP: (123-191)/(78-106) 163/80 (02/05 0815) SpO2:  [94 %-100 %] 98 % (02/05 0815) Last BM Date: 11/01/18  Intake/Output from previous day: 02/04 0701 - 02/05 0700 In: 100 [P.O.:100] Out: 2355 [Urine:2355] Intake/Output this shift: No intake/output data recorded.  Moderate to heavy bleeding is noted on the bandaging.  Minimal purulence.  Erythema and edema in the right foot improved but still some significant erythema surrounding the incision area laterally.  No expressible purulence.  Lab Results:  Recent Labs    11/02/18 0316 11/03/18 0407  WBC 6.7 6.9  HGB 14.8 15.2  HCT 43.2 44.8  PLT 210 232   BMET Recent Labs    11/02/18 0316 11/03/18 0407  NA 136 136  K 3.8 4.4  CL 105 104  CO2 26 27  GLUCOSE 219* 212*  BUN 6 8  CREATININE 0.63 0.70  CALCIUM 10.7* 10.9*   PT/INR No results for input(s): LABPROT, INR in the last 72 hours. ABG No results for input(s): PHART, HCO3 in the last 72 hours.  Invalid input(s): PCO2, PO2  Studies/Results: No results found.  Anti-infectives: Anti-infectives (From admission, onward)   Start     Dose/Rate Route Frequency Ordered Stop   11/02/18 1307  vancomycin (VANCOCIN) powder  Status:  Discontinued       As needed 11/02/18 1307 11/02/18 1332   11/01/18 2200  metroNIDAZOLE (FLAGYL) tablet 500 mg     500 mg Oral Every 12 hours 11/01/18 1559     11/01/18 1800  cefTRIAXone (ROCEPHIN) 2 g in sodium chloride 0.9 % 100 mL IVPB     2 g 200 mL/hr over 30 Minutes Intravenous Every 24 hours 11/01/18 1559     10/31/18 0400  vancomycin (VANCOCIN) 2,000 mg in sodium chloride 0.9 % 500 mL IVPB  Status:   Discontinued     2,000 mg 250 mL/hr over 120 Minutes Intravenous Every 12 hours 10/30/18 1428 11/02/18 1537   10/31/18 0100  piperacillin-tazobactam (ZOSYN) IVPB 3.375 g  Status:  Discontinued     3.375 g 12.5 mL/hr over 240 Minutes Intravenous Every 8 hours 10/30/18 1428 11/01/18 1558   10/30/18 1430  vancomycin (VANCOCIN) 1,500 mg in sodium chloride 0.9 % 500 mL IVPB     1,500 mg 250 mL/hr over 120 Minutes Intravenous  Once 10/30/18 1423 10/30/18 1926   10/30/18 1400  piperacillin-tazobactam (ZOSYN) IVPB 4.5 g  Status:  Discontinued     4.5 g 200 mL/hr over 30 Minutes Intravenous  Once 10/30/18 1317 10/30/18 1319   10/30/18 1400  piperacillin-tazobactam (ZOSYN) IVPB 4.5 g  Status:  Discontinued     4.5 g 200 mL/hr over 30 Minutes Intravenous  Once 10/30/18 1319 10/30/18 1322   10/30/18 1330  piperacillin-tazobactam (ZOSYN) IVPB 4.5 g  Status:  Discontinued     4.5 g 200 mL/hr over 30 Minutes Intravenous  Once 10/30/18 1241 10/30/18 1319   10/30/18 1330  piperacillin-tazobactam (ZOSYN) 4.5 g in dextrose 5 % 50 mL IVPB     4.5 g 100 mL/hr over 30 Minutes Intravenous  Once 10/30/18 1322 10/30/18 1650   10/30/18 1245  vancomycin (VANCOCIN) IVPB 1000 mg/200 mL premix  1,000 mg 200 mL/hr over 60 Minutes Intravenous  Once 10/30/18 1241 10/30/18 1423      Assessment/Plan: s/p Procedure(s): IRRIGATION AND DEBRIDEMENT FOOT (Right) Assessment: Stable status post debridement and ray resection right foot.   Plan: Betadine and a sterile bandage reapplied to the right foot.  Reevaluate tomorrow morning with hopefully potential discharge with home health care for wound management and follow-up in a week  LOS: 4 days    Durward Fortes 11/03/2018

## 2018-11-04 DIAGNOSIS — B9561 Methicillin susceptible Staphylococcus aureus infection as the cause of diseases classified elsewhere: Secondary | ICD-10-CM

## 2018-11-04 LAB — CULTURE, BLOOD (ROUTINE X 2)
CULTURE: NO GROWTH
Culture: NO GROWTH
SPECIAL REQUESTS: ADEQUATE
Special Requests: ADEQUATE

## 2018-11-04 LAB — GLUCOSE, CAPILLARY
GLUCOSE-CAPILLARY: 218 mg/dL — AB (ref 70–99)
GLUCOSE-CAPILLARY: 232 mg/dL — AB (ref 70–99)
Glucose-Capillary: 211 mg/dL — ABNORMAL HIGH (ref 70–99)

## 2018-11-04 MED ORDER — SODIUM CHLORIDE 0.9 % IV SOLN
3.0000 g | Freq: Four times a day (QID) | INTRAVENOUS | Status: DC
  Administered 2018-11-04: 3 g via INTRAVENOUS
  Filled 2018-11-04 (×5): qty 3

## 2018-11-04 MED ORDER — AMLODIPINE BESYLATE 10 MG PO TABS: 10.0000 mg | ORAL_TABLET | Freq: Every day | ORAL | 0 refills | Status: DC

## 2018-11-04 MED ORDER — AMPICILLIN-SULBACTAM IV (FOR PTA / DISCHARGE USE ONLY): 12.0000 g | INTRAVENOUS | 0 refills | Status: AC

## 2018-11-04 MED ORDER — OXYCODONE HCL 5 MG PO TABS: 5.0000 mg | ORAL_TABLET | ORAL | 0 refills | Status: DC | PRN

## 2018-11-04 MED ORDER — LISINOPRIL 20 MG PO TABS: 20.0000 mg | ORAL_TABLET | Freq: Every day | ORAL | 0 refills | Status: DC

## 2018-11-04 NOTE — Care Management (Signed)
Notified Corene Cornea with [facility] of need for IV ABX and PICC care, the patient has already chosen [facility] for PT previously in this admission

## 2018-11-04 NOTE — Progress Notes (Signed)
   Date of Admission:  10/30/2018   Today 11/04/18   ID: [name] is a 53 y.o. male  Active Problems:   Osteomyelitis ([place]) Diabetic foot infection   Subjective: Doing well No fever, rash, diarrhea  Medications:  . amLODipine  10 mg Oral Daily  . insulin aspart  0-9 Units Subcutaneous TID WC  . insulin glargine  20 Units Subcutaneous Daily  . lisinopril  20 mg Oral Daily  . omega-3 acid ethyl esters  1 g Oral QODAY    Objective: Vital signs in last 24 hours: Temp:  [97.4 F (36.3 C)-98.8 F (37.1 C)] 97.5 F (36.4 C) (02/06 0737) Pulse Rate:  [74-86] 76 (02/06 0737) Resp:  [17-18] 18 (02/06 0737) BP: (159-168)/(88-102) 159/94 (02/06 0737) SpO2:  [97 %-99 %] 97 % (02/06 0737)  PHYSICAL EXAM:  General: Alert, cooperative, no distress, appears stated age.  Head: Normocephalic, without obvious abnormality, atraumatic. Eyes: Conjunctivae clear, anicteric sclerae. Pupils are equal ENT Nares normal. No drainage or sinus tenderness. Lips, mucosa, and tongue normal. No Thrush Neck: Supple, symmetrical, no adenopathy, thyroid: non tender no carotid bruit and no JVD. Back: No CVA tenderness. Lungs: Clear to auscultation bilaterally. No Wheezing or Rhonchi. No rales. Heart: Regular rate and rhythm, no murmur, rub or gallop. Abdomen: Soft, non-tender,not distended. Bowel sounds normal. No masses Extremities:  Rt foot edema none, erythema better- some maceration at the surgical site Better than yesterday    Skin: No rashes or lesions. Or bruising Lymph: Cervical, supraclavicular normal. Neurologic: Grossly non-focal  Lab Results Recent Labs    11/02/18 0316 11/03/18 0407  WBC 6.7 6.9  HGB 14.8 15.2  HCT 43.2 44.8  NA 136 136  K 3.8 4.4  CL 105 104  CO2 26 27  BUN 6 8  CREATININE 0.63 0.70   Liver Panel No results for input(s): PROT, ALBUMIN, AST, ALT, ALKPHOS, BILITOT, BILIDIR, IBILI in the last 72 hours. Sedimentation Rate No results for input(s): ESRSEDRATE  in the last 72 hours. C-Reactive Protein No results for input(s): CRP in the last 72 hours.  Microbiology:  Studies/Results: [place] Ekg Site Rite  Result Date: 11/03/2018 If [facility] image not attached, placement could not be confirmed due to current cardiac rhythm.    Assessment/Plan: 53 year old male with history of hypertension diabetes noncompliant with meds  Diabetic foot infection with severe cellulitis and abscess of the right foot with  necrotizing infection. Status post partial fifth ray amputation .osteomyelitis in the margin  ?mixed organisms in the culture Gropu C strep, MSSA, anerobes Ceftriaxone and flagyl changed to unasy for 4 weeks, and then will follow with PO augmentin Home IV antibiotic plan placed ? _Hypertension:  on lisinopril  Diabetes mellitus on Lantus.  Hypercalcemiapatient has had increased calcium for the last 2 years may have to look into this further as OP.   Pt seen with [place] podiatrist. Will see him in 2-3 weeks in my office

## 2018-11-04 NOTE — Progress Notes (Signed)
PT Cancellation Note  Patient Details Name: [name] MRN: [id number] DOB: 1966-06-09   Cancelled Treatment:    Reason Eval/Treat Not Completed: Other (comment)(Order completed by physician.).  Per RN, following physician assessment, pt's status has been updated to NWB again and pt will only be ambulating to restroom.  PT order removed at this time.  Please consult if a need arises in the future.  Roxanne Gates, PT, DPT  Roxanne Gates 11/04/2018, 4:04 PM

## 2018-11-04 NOTE — Progress Notes (Signed)
2 Days Post-Op   Subjective/Chief Complaint: Patient seen.  Overall doing well.   Objective: Vital signs in last 24 hours: Temp:  [97.4 F (36.3 C)-98.8 F (37.1 C)] 97.5 F (36.4 C) (02/06 0737) Pulse Rate:  [73-86] 76 (02/06 0737) Resp:  [17-18] 18 (02/06 0737) BP: (159-168)/(80-102) 159/94 (02/06 0737) SpO2:  [97 %-99 %] 97 % (02/06 0737) Last BM Date: 11/01/18  Intake/Output from previous day: 02/05 0701 - 02/06 0700 In: 117.6 [IV Piggyback:117.6] Out: 3340 [Urine:3340] Intake/Output this shift: Total I/O In: -  Out: 400 [Urine:400]  Moderate drainage on the bandaging.  Upon removal incision and wound appears mostly coapted with decreased erythema and edema.  No expressible purulence or drainage.  Lab Results:  Recent Labs    11/02/18 0316 11/03/18 0407  WBC 6.7 6.9  HGB 14.8 15.2  HCT 43.2 44.8  PLT 210 232   BMET Recent Labs    11/02/18 0316 11/03/18 0407  NA 136 136  K 3.8 4.4  CL 105 104  CO2 26 27  GLUCOSE 219* 212*  BUN 6 8  CREATININE 0.63 0.70  CALCIUM 10.7* 10.9*   PT/INR No results for input(s): LABPROT, INR in the last 72 hours. ABG No results for input(s): PHART, HCO3 in the last 72 hours.  Invalid input(s): PCO2, PO2  Studies/Results: [place] Ekg Site Rite  Result Date: 11/03/2018 If Site Rite image not attached, placement could not be confirmed due to current cardiac rhythm.   Anti-infectives: Anti-infectives (From admission, onward)   Start     Dose/Rate Route Frequency Ordered Stop   11/03/18 1600  vancomycin (VANCOCIN) 2,000 mg in sodium chloride 0.9 % 500 mL IVPB     2,000 mg 250 mL/hr over 120 Minutes Intravenous  Once 11/03/18 1510 11/03/18 1605   11/02/18 1307  vancomycin (VANCOCIN) powder  Status:  Discontinued       As needed 11/02/18 1307 11/02/18 1332   11/01/18 2200  metroNIDAZOLE (FLAGYL) tablet 500 mg     500 mg Oral Every 12 hours 11/01/18 1559     11/01/18 1800  cefTRIAXone (ROCEPHIN) 2 g in sodium chloride 0.9 %  100 mL IVPB     2 g 200 mL/hr over 30 Minutes Intravenous Every 24 hours 11/01/18 1559     10/31/18 0400  vancomycin (VANCOCIN) 2,000 mg in sodium chloride 0.9 % 500 mL IVPB  Status:  Discontinued     2,000 mg 250 mL/hr over 120 Minutes Intravenous Every 12 hours 10/30/18 1428 11/02/18 1537   10/31/18 0100  piperacillin-tazobactam (ZOSYN) IVPB 3.375 g  Status:  Discontinued     3.375 g 12.5 mL/hr over 240 Minutes Intravenous Every 8 hours 10/30/18 1428 11/01/18 1558   10/30/18 1430  vancomycin (VANCOCIN) 1,500 mg in sodium chloride 0.9 % 500 mL IVPB     1,500 mg 250 mL/hr over 120 Minutes Intravenous  Once 10/30/18 1423 10/30/18 1926   10/30/18 1400  piperacillin-tazobactam (ZOSYN) IVPB 4.5 g  Status:  Discontinued     4.5 g 200 mL/hr over 30 Minutes Intravenous  Once 10/30/18 1317 10/30/18 1319   10/30/18 1400  piperacillin-tazobactam (ZOSYN) IVPB 4.5 g  Status:  Discontinued     4.5 g 200 mL/hr over 30 Minutes Intravenous  Once 10/30/18 1319 10/30/18 1322   10/30/18 1330  piperacillin-tazobactam (ZOSYN) IVPB 4.5 g  Status:  Discontinued     4.5 g 200 mL/hr over 30 Minutes Intravenous  Once 10/30/18 1241 10/30/18 1319   10/30/18 1330  piperacillin-tazobactam (ZOSYN) 4.5 g in dextrose 5 % 50 mL IVPB     4.5 g 100 mL/hr over 30 Minutes Intravenous  Once 10/30/18 1322 10/30/18 1650   10/30/18 1245  vancomycin (VANCOCIN) IVPB 1000 mg/200 mL premix     1,000 mg 200 mL/hr over 60 Minutes Intravenous  Once 10/30/18 1241 10/30/18 1423      Assessment/Plan: s/p Procedure(s): IRRIGATION AND DEBRIDEMENT FOOT (Right) Assessment: Stable status post ray resection and debridement right foot.   Plan: Betadine and a sterile bandage reapplied.  Patient should be stable for discharge today on IV antibiotics per infectious disease and wound care 3 times a week with home health care.  Order has already been put in.  Follow-up in 1 week for reevaluation of the wound.  LOS: 5 days    Durward Fortes 11/04/2018

## 2018-11-04 NOTE — Consult Note (Signed)
PHARMACY CONSULT NOTE FOR:  OUTPATIENT  PARENTERAL ANTIBIOTIC THERAPY (OPAT)  Indication: Osteomyelitis  Regimen: Unasyn 12 g IV continuous over 24 hrs End date: 12/02/2018  IV antibiotic discharge orders are pended. To discharging provider:  please sign these orders via discharge navigator,  Select New Orders & click on the button choice - Manage This Unsigned Work.     Thank you for allowing pharmacy to be a part of this patient's care.   Paticia Stack, PharmD Pharmacy Resident  11/04/2018 8:38 AM

## 2018-11-04 NOTE — Discharge Instructions (Signed)
It was so nice to meet you during this hospitalization!  You came into the hospital with a foot wound. We had to do 2 surgeries to get it cleaned out.   The infectious disease doctor is recommending an antibiotic called Unasyn to be given through your PICC line for 4 weeks.  The foot doctor is recommending that the home health nurse change your dressing three times a week.  Your blood pressure was high when you were here. We have started two blood pressure medicines- please take Norvasc 10mg  daily and Lisinopril 20mg  daily. Please follow-up with your primary care doctor for a blood pressure and diabetes check.  Take care, Dr. Brett Albino

## 2018-11-04 NOTE — Treatment Plan (Signed)
Diagnosis: Osteomyelitis, DFI-RT foot Baseline Creatinine  0.70  Culture Result: Group C strep/MSSA/anerobes No Known Allergies  OPAT Orders Discharge antibiotics: Unasyn 3 grams IV every 6 hours- Pt wants this in a timed 24 hr bag instead of him doing it every 6 hours until 12/02/18 If the timed 6 hr infusion is not feasible let me know as the antibiotic will have to be changed  4 weeks  PIC Care Per Protocol-including placement of biopatch  Labs weekly on Monday while on IV antibiotics: _X_ CBC with differential _X_ CMP  ESR/CRP  once every 2 weeks on  monday    X__ Please leave PIC in place until doctor has seen patient or been notified  Fax weekly labs to [phone] Promptly  Clinic Follow Up Appt in 2-3 weeks  Call [phone] to make appt    

## 2018-11-04 NOTE — Discharge Summary (Signed)
[facility] [place] NAME: [name]    MR#:  [id number]  DATE OF BIRTH:  [birth year]  DATE OF ADMISSION:  10/30/2018   ADMITTING PHYSICIAN: Fritzi Mandes, MD  DATE OF DISCHARGE: 11/04/18  PRIMARY CARE PHYSICIAN: Valera Castle, MD   ADMISSION DIAGNOSIS:  Subacute osteomyelitis of right foot ([place]) [M86.271] DISCHARGE DIAGNOSIS:  Active Problems:   Osteomyelitis ([place])  SECONDARY DIAGNOSIS:   Past Medical History:  Diagnosis Date  . Diabetes ([place])   . Hypertension    HOSPITAL COURSE:   [name] is a 53 year old male who presented to the ED with increasing redness and tenderness of his right foot wound.  In the ED, x-ray showed soft tissue swelling, gas, and possible osteomyelitis.  He was started on empiric antibiotics and was admitted for further management.  1.Right 5th MTP osteomyelitis/lateral foot cellulitis- improved -s/p partial fifth ray amputation and I&D abscess of dorsal right foot 10/31/18 with podiatry -Repeat I&D 11/02/18 -Podiatry recommended dressing changes 3 times a week with home health.  Needs to follow-up with podiatry in 1 week. -ID consulted- recommended IV Unasyn for 4 weeks.  PICC line placed and home health ordered. Needs to f/u in ID clinic in 2-3 weeks. -Blood cultures with no growth -Intraoperative cultures 2/2 growing moderate group B strep, few staph aureus, and anaerobes -PT recommended home health PT, in addition to [facility] RN for wound care  2.Type II diabetes- blood sugars mildly elevated, likely due to infection -A1c 7.8 this admission -Home meds continued on discharge -Needs to f/u with PCP for close blood sugar monitoring  3.Hypertension- BPs elevated throughout admission -Started on lisinopril 20 mg daily and Norvasc 10 mg daily -Needs to follow-up with PCP for BP check  DISCHARGE CONDITIONS:  Diabetic right foot wound with osteomyelitis and cellulitis Type 2 diabetes Hypertension CONSULTS  OBTAINED:  Treatment Team:  Samara Deist, DPM DRUG ALLERGIES:  No Known Allergies DISCHARGE MEDICATIONS:   Allergies as of 11/04/2018   No Known Allergies     Medication List    STOP taking these medications   amoxicillin-clavulanate 875-125 MG tablet Commonly known as:  AUGMENTIN     TAKE these medications   amLODipine 10 MG tablet Commonly known as:  NORVASC Take 1 tablet (10 mg total) by mouth daily. Start taking on:  November 05, 2018   ampicillin-sulbactam  IVPB Commonly known as:  UNASYN Inject 12 g into the vein continuous for 28 days. Give 12 g over 24 hrs as a continuous infusion Indication:  Osteomyelitis Last Day of Therapy:  12/02/2018 Labs weekly on Monday while on IV antibiotics: _X_ CBC with differential _X_ CMP  ESR/CRP  once every 2 weeks on  monday   GLIPIZIDE XL 5 MG 24 hr tablet Generic drug:  glipiZIDE Take 1 tablet by mouth daily.   ibuprofen 200 MG tablet Commonly known as:  ADVIL,MOTRIN Take 200-400 mg by mouth every 6 (six) hours as needed.   lisinopril 20 MG tablet Commonly known as:  PRINIVIL,ZESTRIL Take 1 tablet (20 mg total) by mouth daily. Start taking on:  November 05, 2018   omega-3 acid ethyl esters 1 g capsule Commonly known as:  LOVAZA Take 1 g by mouth every other day.   oxyCODONE 5 MG immediate release tablet Commonly known as:  Oxy IR/ROXICODONE Take 1 tablet (5 mg total) by mouth every 4 (four) hours as needed for moderate pain.  Home Infusion Instuctions  (From admission, onward)         Start     Ordered   11/04/18 0000  Home infusion instructions [facility] May follow [place] Dosing Protocol; May administer Cathflo as needed to maintain patency of vascular access device.; Flushing of vascular access device: per [facility] Protocol: 0.9% NaCl pre/post medica...    Question Answer Comment  Instructions May follow [place] Dosing Protocol   Instructions May administer Cathflo as needed to  maintain patency of vascular access device.   Instructions Flushing of vascular access device: per [facility] Protocol: 0.9% NaCl pre/post medication administration and prn patency; Heparin 100 u/ml, 41m for implanted ports and Heparin 10u/ml, 565mfor all other central venous catheters.   Instructions May follow [facility] Anaphylaxis Protocol for First Dose Administration in the home: 0.9% NaCl at 25-50 ml/hr to maintain IV access for protocol meds. Epinephrine 0.3 ml IV/IM PRN and Benadryl 25-50 IV/IM PRN s/s of anaphylaxis.   Instructions [facility] Infusion Coordinator (RN) to assist per patient IV care needs in the home PRN.      11/04/18 0857           Durable Medical Equipment  (From admission, onward)         Start     Ordered   11/04/18 0901  For home use only DME [facility]    Question:  Patient needs a walker to treat with the following condition  Answer:  Diabetic foot infection ([facility]  11/04/18 0900           DISCHARGE INSTRUCTIONS:  1.  Follow-up with PCP in 5 days 2.  Follow-up with podiatry in 1 week 3.  Follow-up with ID in 2 to 3 weeks 4.  Take Norvasc 10 mg daily and lisinopril 20 mg daily for blood pressure 5.  Home health set up for home health PT and home health RN 6.  Needs dressing changes 3 times weekly 7.  ID recommended Unasyn IV x 4 weeks DIET:  Cardiac diet and Diabetic diet DISCHARGE CONDITION:  Stable ACTIVITY:  Activity as tolerated OXYGEN:  Home Oxygen: No.  Oxygen Delivery: room air DISCHARGE LOCATION:  home   If you experience worsening of your admission symptoms, develop shortness of breath, life threatening emergency, suicidal or homicidal thoughts you must seek medical attention immediately by calling 911 or calling your MD immediately  if symptoms less severe.  You Must read complete instructions/literature along with all the possible adverse reactions/side effects for all the Medicines you take and that have been  prescribed to you. Take any new Medicines after you have completely understood and accpet all the possible adverse reactions/side effects.   Please note  You were cared for by a hospitalist during your hospital stay. If you have any questions about your discharge medications or the care you received while you were in the hospital after you are discharged, you can call the unit and asked to speak with the hospitalist on call if the hospitalist that took care of you is not available. Once you are discharged, your primary care physician will handle any further medical issues. Please note that NO REFILLS for any discharge medications will be authorized once you are discharged, as it is imperative that you return to your primary care physician (or establish a relationship with a primary care physician if you do not have one) for your aftercare needs so that they can reassess your need  for medications and monitor your lab values.    On the day of Discharge:  VITAL SIGNS:  Blood pressure (!) 159/94, pulse 76, temperature (!) 97.5 F (36.4 C), temperature source Oral, resp. rate 18, height '6\' 3"'  (1.905 m), weight 106.6 kg, SpO2 97 %. PHYSICAL EXAMINATION:  GENERAL:52 y.o. patient lying in the bed with no acute distress.  EYES: Pupils equal, round, reactive to light and accommodation. No scleral icterus. Extraocular muscles intact.  HEENT: Head atraumatic, normocephalic. Oropharynx and nasopharynx clear.  Moist mucous membranes. NECK: Supple, no jugular venous distention. No thyroid enlargement, no tenderness.  LUNGS: Normal breath sounds bilaterally, no wheezing, rales,rhonchi or crepitation. No use of accessory muscles of respiration.  CARDIOVASCULAR: RRR, S1, S2 normal. No murmurs, rubs, or gallops.  ABDOMEN: Soft, nontender, nondistended. Bowel sounds present. No organomegaly or mass.  EXTREMITIES: Dry bandage in place over right foot.  No edema of the left foot.  Lower extremities are warm to the  touch.  No cyanosis or clubbing. NEUROLOGIC: Cranial nerves II through XII are intact. Muscle strength 5/5 in all extremities. Sensation intact. Gait not checked.  PSYCHIATRIC: The patient is alert and oriented x 3.  SKIN: No obvious rash. Right foot lesion as above. DATA REVIEW:   CBC Recent Labs  Lab 11/03/18 0407  WBC 6.9  HGB 15.2  HCT 44.8  PLT 232    Chemistries  Recent Labs  Lab 11/03/18 0407  NA 136  K 4.4  CL 104  CO2 27  GLUCOSE 212*  BUN 8  CREATININE 0.70  CALCIUM 10.9*     Microbiology Results  Results for orders placed or performed during the hospital encounter of 10/30/18  Culture, blood (routine x 2)     Status: None   Collection Time: 10/30/18 12:20 PM  Result Value Ref Range Status   Specimen Description BLOOD BLOOD LEFT FOREARM  Final   Special Requests   Final    BOTTLES DRAWN AEROBIC AND ANAEROBIC Blood Culture adequate volume   Culture   Final    NO GROWTH 5 DAYS Performed at [facility], [address] [place], [place] [address]    Report Status 11/04/2018 FINAL  Final  Culture, blood (routine x 2)     Status: None   Collection Time: 10/30/18 12:25 PM  Result Value Ref Range Status   Specimen Description BLOOD BLOOD RIGHT HAND  Final   Special Requests   Final    BOTTLES DRAWN AEROBIC AND ANAEROBIC Blood Culture adequate volume   Culture   Final    NO GROWTH 5 DAYS Performed at [facility], [address] [place], [place] [address]    Report Status 11/04/2018 FINAL  Final  Surgical PCR screen     Status: None   Collection Time: 10/30/18  6:33 PM  Result Value Ref Range Status   MRSA, PCR NEGATIVE NEGATIVE Final   Staphylococcus aureus NEGATIVE NEGATIVE Final    Comment: (NOTE) The Xpert SA Assay (FDA approved for NASAL specimens in patients 24 years of age and older), is one component of a comprehensive surveillance program. It is not intended to diagnose infection nor to guide or monitor  treatment. Performed at [facility], [address] [place], [place] [address]   Aerobic/Anaerobic Culture (surgical/deep wound)     Status: None (Preliminary result)   Collection Time: 10/31/18  8:53 AM  Result Value Ref Range Status   Specimen Description   Final    BONE RIGHT 5TH  METATARSAL Performed at [facility], [address] [place], [place] [address]    Special Requests   Final    NONE Performed at [facility], [place], [place] [address]    Gram Stain   Final    NO WBC SEEN RARE Lonell Grandchild POSITIVE COCCI RARE GRAM POSITIVE RODS    Culture   Final    MODERATE STREPTOCOCCUS GROUP G FEW STAPHYLOCOCCUS AUREUS HOLDING FOR POSSIBLE ANAEROBE Performed at [facility], [place] [address], [place], [place] [address]    Report Status PENDING  Incomplete   Organism ID, Bacteria STAPHYLOCOCCUS AUREUS  Final      Susceptibility   Staphylococcus aureus - MIC*    CIPROFLOXACIN <=0.5 SENSITIVE Sensitive     ERYTHROMYCIN >=8 RESISTANT Resistant     GENTAMICIN <=0.5 SENSITIVE Sensitive     OXACILLIN 0.5 SENSITIVE Sensitive     TETRACYCLINE <=1 SENSITIVE Sensitive     VANCOMYCIN <=0.5 SENSITIVE Sensitive     TRIMETH/SULFA <=10 SENSITIVE Sensitive     CLINDAMYCIN RESISTANT Resistant     RIFAMPIN <=0.5 SENSITIVE Sensitive     Inducible Clindamycin POSITIVE Resistant     * FEW STAPHYLOCOCCUS AUREUS    RADIOLOGY:  No results found.   Management plans discussed with the patient, family and they are in agreement.  CODE STATUS: Full Code   TOTAL TIME TAKING CARE OF THIS PATIENT: 45 minutes.    Berna Spare Jeovanny Cuadros M.D on 11/04/2018 at 2:33 PM  Between 7am to 6pm - Pager [phone]  After 6pm go to [email or web address] - Proofreader  [facility]  Office  [phone]  CC: Primary care physician; Valera Castle, MD   Note: This dictation was prepared with Dragon dictation along with smaller  phrase technology. Any transcriptional errors that result from this process are unintentional.

## 2018-11-05 NOTE — Anesthesia Postprocedure Evaluation (Signed)
Anesthesia Post Note  Patient: [name]  Procedure(s) Performed: IRRIGATION AND DEBRIDEMENT FOOT (Right )  Patient location during evaluation: PACU Anesthesia Type: General Level of consciousness: awake and alert Pain management: pain level controlled Vital Signs Assessment: post-procedure vital signs reviewed and stable Respiratory status: spontaneous breathing, nonlabored ventilation, respiratory function stable and patient connected to nasal cannula oxygen Cardiovascular status: blood pressure returned to baseline and stable Postop Assessment: no apparent nausea or vomiting Anesthetic complications: no     Last Vitals:  Vitals:   11/04/18 0737 11/04/18 1632  BP: (!) 159/94 (!) 148/107  Pulse: 76 84  Resp: 18 18  Temp: (!) 36.4 C 36.9 C  SpO2: 97% 99%    Last Pain:  Vitals:   11/04/18 1632  TempSrc: Oral  PainSc:                  Martha Clan

## 2018-11-06 LAB — AEROBIC/ANAEROBIC CULTURE W GRAM STAIN (SURGICAL/DEEP WOUND): Gram Stain: NONE SEEN

## 2018-11-09 ENCOUNTER — Telehealth: Payer: Self-pay | Admitting: Infectious Diseases

## 2018-11-09 NOTE — Telephone Encounter (Signed)
Nurse called with wanting Dr. Delaine Lame to sign the [place] so that they can draw labs today.   They have reached out to PCP and Podiatrist and haven't received response..  [phone] is call back #

## 2018-11-09 NOTE — Telephone Encounter (Signed)
Advised nurse that we will sign for plan of care

## 2018-11-10 ENCOUNTER — Telehealth: Payer: Self-pay

## 2018-11-10 NOTE — Care Management (Signed)
2/12 Notified Dr. Brett Albino that the [facility] order for wound care was not in place, requested an order to put in so that [facility] can provide the wound care to the patient as planned

## 2018-11-11 ENCOUNTER — Ambulatory Visit: Payer: BC Managed Care – PPO | Admitting: Infectious Diseases

## 2018-11-23 ENCOUNTER — Ambulatory Visit: Payer: BC Managed Care – PPO | Admitting: Infectious Diseases

## 2018-11-30 ENCOUNTER — Encounter: Payer: Self-pay | Admitting: Infectious Diseases

## 2018-12-02 ENCOUNTER — Encounter: Payer: Self-pay | Admitting: Infectious Diseases

## 2018-12-02 ENCOUNTER — Ambulatory Visit: Payer: BC Managed Care – PPO | Attending: Infectious Diseases | Admitting: Infectious Diseases

## 2018-12-02 VITALS — BP 120/77 | HR 95 | Temp 98.0°F | Ht 75.0 in | Wt 241.4 lb

## 2018-12-02 DIAGNOSIS — L03115 Cellulitis of right lower limb: Secondary | ICD-10-CM | POA: Diagnosis not present

## 2018-12-02 DIAGNOSIS — B9689 Other specified bacterial agents as the cause of diseases classified elsewhere: Secondary | ICD-10-CM

## 2018-12-02 DIAGNOSIS — E11628 Type 2 diabetes mellitus with other skin complications: Secondary | ICD-10-CM | POA: Diagnosis not present

## 2018-12-02 DIAGNOSIS — Z9114 Patient's other noncompliance with medication regimen: Secondary | ICD-10-CM

## 2018-12-02 DIAGNOSIS — Z89421 Acquired absence of other right toe(s): Secondary | ICD-10-CM

## 2018-12-02 DIAGNOSIS — B9561 Methicillin susceptible Staphylococcus aureus infection as the cause of diseases classified elsewhere: Secondary | ICD-10-CM | POA: Diagnosis not present

## 2018-12-02 DIAGNOSIS — M86171 Other acute osteomyelitis, right ankle and foot: Secondary | ICD-10-CM

## 2018-12-02 DIAGNOSIS — B954 Other streptococcus as the cause of diseases classified elsewhere: Secondary | ICD-10-CM

## 2018-12-02 DIAGNOSIS — Z794 Long term (current) use of insulin: Secondary | ICD-10-CM

## 2018-12-02 DIAGNOSIS — L089 Local infection of the skin and subcutaneous tissue, unspecified: Secondary | ICD-10-CM

## 2018-12-02 DIAGNOSIS — L02611 Cutaneous abscess of right foot: Secondary | ICD-10-CM

## 2018-12-02 DIAGNOSIS — Z95828 Presence of other vascular implants and grafts: Secondary | ICD-10-CM

## 2018-12-02 DIAGNOSIS — I10 Essential (primary) hypertension: Secondary | ICD-10-CM

## 2018-12-02 DIAGNOSIS — Z79899 Other long term (current) drug therapy: Secondary | ICD-10-CM

## 2018-12-02 MED ORDER — AMOXICILLIN-POT CLAVULANATE 875-125 MG PO TABS: 1.0000 | ORAL_TABLET | Freq: Two times a day (BID) | ORAL | 0 refills | Status: DC

## 2018-12-02 NOTE — Patient Instructions (Addendum)
You are here for follow up after hospital discharge.you are completing 4 weeks of IV for the rt foot infection- the foot is doing well- will stop IV and remove PICC tomorrow and start taking augmentin 1 tablet twice a day along with yogurt or probiotic

## 2018-12-02 NOTE — Progress Notes (Signed)
NAME: [name]  DOB: 19-Jun-1966  MRN: [id number]  Date/Time: 12/02/2018 10:11 AM   Subjective:  Follow up  ? [name] is a 53 y.o. male with a history of DM, HTN was recently in hospital 2/1-11/04/18 with rt foot infection and underwent ray excision of the 5th toe. Osteomyelitis was present and on the margin of the bone had osteo and he was sent home on unasyn for Group C, MSSA, anerobes in culture HE is now completing 4 weeks of unasyn and the PICC will be removed tomorrow- He will start Po Augmentin. He has no sids effects from the IV. No diarrhea, no rash, no fever The wound is healing well and he saw podiatrist and removed sutures. Past Medical History:  Diagnosis Date  . Diabetes ([place])   . Hypertension     Past Surgical History:  Procedure Laterality Date  . IRRIGATION AND DEBRIDEMENT ABSCESS Right 10/31/2018   Procedure: IRRIGATION AND DEBRIDEMENT ABSCESS;  Surgeon: Samara Deist, DPM;  Location: [facility];  Service: Podiatry;  Laterality: Right;  . IRRIGATION AND DEBRIDEMENT FOOT Right 11/02/2018   Procedure: IRRIGATION AND DEBRIDEMENT FOOT;  Surgeon: Sharlotte Alamo, DPM;  Location: [facility] OR[facility];  Service: Podiatry;  Laterality: Right;     Family History  Problem Relation Age of Onset  . CAD Father   . CAD Brother    No Known Allergies  ? Current Outpatient Medications  Medication Sig Dispense Refill  . amLODipine (NORVASC) 10 MG tablet Take 1 tablet (10 mg total) by mouth daily. 30 tablet 0  . ampicillin-sulbactam (UNASYN) IVPB Inject 12 g into the vein continuous for 28 days. Give 12 g over 24 hrs as a continuous infusion Indication:  Osteomyelitis Last Day of Therapy:  12/02/2018 Labs weekly on Monday while on IV antibiotics: _X_ CBC with differential _X_ CMP  ESR/CRP  once every 2 weeks on  monday  28 Units 0  . atorvastatin (LIPITOR) 40 MG tablet Take 40 mg by mouth daily.    Marland Kitchen GLIPIZIDE XL 5 MG 24 hr tablet Take 1 tablet by mouth daily.  11  . ibuprofen (ADVIL,MOTRIN)  200 MG tablet Take 200-400 mg by mouth every 6 (six) hours as needed.    Marland Kitchen lisinopril (PRINIVIL,ZESTRIL) 20 MG tablet Take 1 tablet (20 mg total) by mouth daily. 30 tablet 0  . omega-3 acid ethyl esters (LOVAZA) 1 g capsule Take 1 g by mouth every other day.    . oxyCODONE (OXY IR/ROXICODONE) 5 MG immediate release tablet Take 1 tablet (5 mg total) by mouth every 4 (four) hours as needed for moderate pain. 30 tablet 0   No current facility-administered medications for this visit.      Abtx:  Anti-infectives (From admission, onward)   None      REVIEW OF SYSTEMS:  Const: negative fever, negative chills, negative weight loss Eyes: negative diplopia or visual changes, negative eye pain ENT: negative coryza, negative sore throat Resp: negative cough, hemoptysis, dyspnea Cards: negative for chest pain, palpitations, lower extremity edema GU: negative for frequency, dysuria and hematuria GI: Negative for abdominal pain, diarrhea, bleeding, constipation Skin: negative for rash and pruritus Heme: negative for easy bruising and gum/nose bleeding MS: negative for myalgias, arthralgias, back pain and muscle weakness Neurolo:negative for headaches, dizziness, vertigo, memory problems  Psych: negative for feelings of anxiety, depression  Endocrine: negative for thyroid, diabetes Allergy/Immunology- negative for any medication or food allergies ?  Objective:  VITALS:  BP 120/77 (BP Location: Left Arm, Patient Position:  Sitting, Cuff Size: Large)   Pulse 95   Temp 98 F (36.7 C) (Oral)   Ht _0  (1.905 m)   Wt 241 lb 6 oz (109.5 kg)   BMI 30.17 kg/m  PHYSICAL EXAM:  General: Alert, cooperative, no distress, appears stated age.  Head: Normocephalic, without obvious abnormality, atraumatic. Eyes: Conjunctivae clear, anicteric sclerae. Pupils are equal ENT Nares normal. No drainage or sinus tenderness. Lips, mucosa, and tongue normal. No Thrush Neck: Supple, symmetrical, no adenopathy,  thyroid: non tender no carotid bruit and no JVD. Back: No CVA tenderness. Lungs: Clear to auscultation bilaterally. No Wheezing or Rhonchi. No rales. Heart: Regular rate and rhythm, no murmur, rub or gallop. Abdomen: Soft, non-tender,not distended. Bowel sounds normal. No masses Extremities: rt PICC  Rt foot- surgical wound is healing well   2/5   2/2     atraumatic, no cyanosis. No edema. No clubbing Skin: No rashes or lesions. Or bruising Lymph: Cervical, supraclavicular normal. Neurologic: Grossly non-focal Pertinent Labs Lab Results CBC Wbc 4.9 Hb 13.7 PLT 146 Cr 0.63 Glucose 159  CMP Latest Ref Rng & Units 11/03/2018 11/02/2018 11/01/2018  Glucose 70 - 99 mg/dL 212(H) 219(H) 199(H)  BUN 6 - 20 mg/dL _1 Creatinine 0.61 - 1.24 mg/dL 0.70 0.63 0.80  Sodium 135 - 145 mmol/L 136 136 135  Potassium 3.5 - 5.1 mmol/L 4.4 3.8 4.3  Chloride 98 - 111 mmol/L 104 105 102  CO2 22 - 32 mmol/L _2 Calcium 8.9 - 10.3 mg/dL 10.9(H) 10.7(H) 10.5(H)     ? Impression/Recommendation ?53 year old male with history of hypertension diabetes noncompliant with meds  Diabetic foot infection with severe cellulitis and abscess of the right foot with necrotizing infection. Status post partial fifth ray amputation .osteomyelitis in the margin - doing very well ?mixed organisms were  in the culture Group  C strep, MSSA, anerobes On IV unasyn -completing 4 weeks today. Iv will be reomved and he will go on PO augmentin 875 mg BID  for 2 weeks . He takes yogurt for probiotic ? _Hypertension:  on lisinopril  Diabetes mellitus on Lantus.  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

## 2019-02-06 ENCOUNTER — Emergency Department: Payer: BC Managed Care – PPO

## 2019-02-06 ENCOUNTER — Other Ambulatory Visit: Payer: Self-pay

## 2019-02-06 ENCOUNTER — Emergency Department
Admission: EM | Admit: 2019-02-06 | Discharge: 2019-02-06 | Disposition: A | Discharge status: Home / Self Care | Payer: BC Managed Care – PPO | Attending: Emergency Medicine | Admitting: Emergency Medicine

## 2019-02-06 DIAGNOSIS — Y998 Other external cause status: Secondary | ICD-10-CM | POA: Diagnosis not present

## 2019-02-06 DIAGNOSIS — S0990XA Unspecified injury of head, initial encounter: Secondary | ICD-10-CM

## 2019-02-06 DIAGNOSIS — I10 Essential (primary) hypertension: Secondary | ICD-10-CM | POA: Diagnosis not present

## 2019-02-06 DIAGNOSIS — E119 Type 2 diabetes mellitus without complications: Secondary | ICD-10-CM | POA: Diagnosis not present

## 2019-02-06 DIAGNOSIS — Y92410 Unspecified street and highway as the place of occurrence of the external cause: Secondary | ICD-10-CM | POA: Diagnosis not present

## 2019-02-06 DIAGNOSIS — S00511A Abrasion of lip, initial encounter: Secondary | ICD-10-CM

## 2019-02-06 DIAGNOSIS — Z79899 Other long term (current) drug therapy: Secondary | ICD-10-CM | POA: Diagnosis not present

## 2019-02-06 DIAGNOSIS — Y93I9 Activity, other involving external motion: Secondary | ICD-10-CM | POA: Insufficient documentation
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
## 2019-06-12 ENCOUNTER — Emergency Department: Payer: BC Managed Care – PPO

## 2019-06-12 ENCOUNTER — Emergency Department
Admission: EM | Admit: 2019-06-12 | Discharge: 2019-06-12 | Disposition: A | Discharge status: Home / Self Care | Payer: BC Managed Care – PPO | Attending: Emergency Medicine | Admitting: Emergency Medicine

## 2019-06-12 ENCOUNTER — Other Ambulatory Visit: Payer: Self-pay

## 2019-06-12 ENCOUNTER — Encounter: Payer: Self-pay | Admitting: Emergency Medicine

## 2019-06-12 DIAGNOSIS — F17290 Nicotine dependence, other tobacco product, uncomplicated: Secondary | ICD-10-CM | POA: Diagnosis not present

## 2019-06-12 DIAGNOSIS — Z79899 Other long term (current) drug therapy: Secondary | ICD-10-CM | POA: Diagnosis not present

## 2019-06-12 DIAGNOSIS — E11621 Type 2 diabetes mellitus with foot ulcer: Secondary | ICD-10-CM | POA: Diagnosis not present

## 2019-06-12 DIAGNOSIS — I10 Essential (primary) hypertension: Secondary | ICD-10-CM | POA: Diagnosis not present

## 2019-06-12 DIAGNOSIS — R2242 Localized swelling, mass and lump, left lower limb: Secondary | ICD-10-CM | POA: Insufficient documentation

## 2019-06-12 DIAGNOSIS — L97529 Non-pressure chronic ulcer of other part of left foot with unspecified severity: Secondary | ICD-10-CM | POA: Diagnosis not present

## 2019-06-12 DIAGNOSIS — Z7984 Long term (current) use of oral hypoglycemic drugs: Secondary | ICD-10-CM | POA: Diagnosis not present

## 2019-06-12 DIAGNOSIS — E13621 Other specified diabetes mellitus with foot ulcer: Secondary | ICD-10-CM

## 2019-06-12 DIAGNOSIS — M79675 Pain in left toe(s): Secondary | ICD-10-CM | POA: Diagnosis present

## 2019-06-12 LAB — CBC WITH DIFFERENTIAL/PLATELET
Abs Immature Granulocytes: 0.03 10*3/uL (ref 0.00–0.07)
Basophils Absolute: 0 10*3/uL (ref 0.0–0.1)
Basophils Relative: 1 %
Eosinophils Absolute: 0.2 10*3/uL (ref 0.0–0.5)
Eosinophils Relative: 3 %
HCT: 46.4 % (ref 39.0–52.0)
Hemoglobin: 16 g/dL (ref 13.0–17.0)
Immature Granulocytes: 0 %
Lymphocytes Relative: 21 %
Lymphs Abs: 1.5 10*3/uL (ref 0.7–4.0)
MCH: 31.2 pg (ref 26.0–34.0)
MCHC: 34.5 g/dL (ref 30.0–36.0)
MCV: 90.4 fL (ref 80.0–100.0)
Monocytes Absolute: 0.6 10*3/uL (ref 0.1–1.0)
Monocytes Relative: 9 %
Neutro Abs: 4.7 10*3/uL (ref 1.7–7.7)
Neutrophils Relative %: 66 %
Platelets: 180 10*3/uL (ref 150–400)
RBC: 5.13 MIL/uL (ref 4.22–5.81)
RDW: 11.7 % (ref 11.5–15.5)
WBC: 7.1 10*3/uL (ref 4.0–10.5)
nRBC: 0 % (ref 0.0–0.2)

## 2019-06-12 LAB — COMPREHENSIVE METABOLIC PANEL
ALT: 99 U/L — ABNORMAL HIGH (ref 0–44)
AST: 108 U/L — ABNORMAL HIGH (ref 15–41)
Albumin: 3.7 g/dL (ref 3.5–5.0)
Alkaline Phosphatase: 143 U/L — ABNORMAL HIGH (ref 38–126)
Anion gap: 10 (ref 5–15)
BUN: 12 mg/dL (ref 6–20)
CO2: 25 mmol/L (ref 22–32)
Calcium: 12 mg/dL — ABNORMAL HIGH (ref 8.9–10.3)
Chloride: 106 mmol/L (ref 98–111)
Creatinine, Ser: 0.75 mg/dL (ref 0.61–1.24)
GFR calc Af Amer: >60 mL/min (ref >60)
GFR calc non Af Amer: >60 mL/min (ref >60)
Glucose, Bld: 227 mg/dL — ABNORMAL HIGH (ref 70–99)
Potassium: 4 mmol/L (ref 3.5–5.1)
Sodium: 141 mmol/L (ref 135–145)
Total Bilirubin: 0.8 mg/dL (ref 0.3–1.2)
Total Protein: 7.1 g/dL (ref 6.5–8.1)

## 2019-06-12 LAB — LACTIC ACID, PLASMA
Lactic Acid, Venous: 2.1 mmol/L (ref 0.5–1.9)
Lactic Acid, Venous: 1.7 mmol/L (ref 0.5–1.9)

## 2019-06-12 MED ORDER — SODIUM CHLORIDE 0.9 % IV BOLUS
1000.0000 mL | Freq: Once | INTRAVENOUS | Status: AC
  Administered 2019-06-12: 1000 mL via INTRAVENOUS

## 2019-06-12 MED ORDER — CEPHALEXIN 500 MG PO CAPS: 500.0000 mg | ORAL_CAPSULE | Freq: Three times a day (TID) | ORAL | 0 refills | Status: AC

## 2019-06-12 MED ORDER — SULFAMETHOXAZOLE-TRIMETHOPRIM 800-160 MG PO TABS: 1.0000 | ORAL_TABLET | Freq: Two times a day (BID) | ORAL | 0 refills | Status: AC

## 2019-06-12 MED ORDER — SODIUM CHLORIDE 0.9 % IV BOLUS
1000.0000 mL | Freq: Once | INTRAVENOUS | Status: AC
  Administered 2019-06-12: 17:00:00 1000 mL via INTRAVENOUS

## 2019-06-12 MED ORDER — SODIUM CHLORIDE 0.9 % IV SOLN
1.0000 g | Freq: Once | INTRAVENOUS | Status: AC
  Administered 2019-06-12: 1 g via INTRAVENOUS
  Filled 2019-06-12: qty 10

## 2019-06-12 NOTE — ED Notes (Signed)
ED PA aware of pt's bp. Pt states bp can get really high. Pt told by ED PA to follow up with primary care to see if meds need to be adjusted.

## 2019-06-12 NOTE — ED Notes (Signed)
Non stick dressing placed on left 2nd toe

## 2019-06-12 NOTE — ED Provider Notes (Signed)
[facility] Emergency Department Provider Note  ____________________________________________  Time seen: Approximately 3:34 PM  I have reviewed the triage vital signs and the nursing notes.   HISTORY  Chief Complaint Toe Injury    HPI [name] is a 53 y.o. male with a history of hypertension and diabetes, presents to the emergency department with a foot ulcer along the left toe.  Patient states that he has had foot ulcer the past several weeks.  Patient states that he has been under the care of his primary care provider and has been taking doxycycline.  Patient states that wound was looking better until he went swimming in a pool and now foot ulcer looks worse.  He also has significant swelling of the left foot and left ankle that patient attributes to his blood pressure medication.  Swelling is significantly more than the right.  He denies fever and chills at home.  He states that his wife used to be a [employer] scientist and has been attempting debridement at home.  No other alleviating measures have been attempted.        Past Medical History:  Diagnosis Date  . Diabetes ([place])   . Hypertension     Patient Active Problem List   Diagnosis Date Noted  . Osteomyelitis ([place]) 10/30/2018  . Dog bite 04/13/2018    Past Surgical History:  Procedure Laterality Date  . IRRIGATION AND DEBRIDEMENT ABSCESS Right 10/31/2018   Procedure: IRRIGATION AND DEBRIDEMENT ABSCESS;  Surgeon: Samara Deist, DPM;  Location: [facility] ORS;  Service: Podiatry;  Laterality: Right;  . IRRIGATION AND DEBRIDEMENT FOOT Right 11/02/2018   Procedure: IRRIGATION AND DEBRIDEMENT FOOT;  Surgeon: Sharlotte Alamo, DPM;  Location: [facility] ORS;  Service: Podiatry;  Laterality: Right;    Prior to Admission medications   Medication Sig Start Date End Date Taking? Authorizing Provider  amLODipine (NORVASC) 10 MG tablet Take 1 tablet (10 mg total) by mouth daily. 11/05/18   Mayo, Pete Pelt, MD  amoxicillin-clavulanate  (AUGMENTIN) 875-125 MG tablet Take 1 tablet by mouth 2 (two) times daily. 12/02/18   Tsosie Billing, MD  atorvastatin (LIPITOR) 40 MG tablet Take 40 mg by mouth daily. 11/10/18 11/10/19  [provider]  cephALEXin (KEFLEX) 500 MG capsule Take 1 capsule (500 mg total) by mouth 3 (three) times daily for 7 days. 06/12/19 06/19/19  Lannie Fields, PA-C  GLIPIZIDE XL 5 MG 24 hr tablet Take 1 tablet by mouth daily. 02/08/18   [provider]  ibuprofen (ADVIL,MOTRIN) 200 MG tablet Take 200-400 mg by mouth every 6 (six) hours as needed.    [provider]  lisinopril (PRINIVIL,ZESTRIL) 20 MG tablet Take 1 tablet (20 mg total) by mouth daily. 11/05/18   Mayo, Pete Pelt, MD  omega-3 acid ethyl esters (LOVAZA) 1 g capsule Take 1 g by mouth every other day.    [provider]  oxyCODONE (OXY IR/ROXICODONE) 5 MG immediate release tablet Take 1 tablet (5 mg total) by mouth every 4 (four) hours as needed for moderate pain. 11/04/18   Mayo, Pete Pelt, MD  sulfamethoxazole-trimethoprim (BACTRIM DS) 800-160 MG tablet Take 1 tablet by mouth 2 (two) times daily for 7 days. 06/12/19 06/19/19  Lannie Fields, PA-C    Allergies Patient has no known allergies.  Family History  Problem Relation Age of Onset  . CAD Father   . CAD Brother     Social History Social History   Tobacco Use  . Smoking status: Light Tobacco Smoker  Types: Cigars  . Smokeless tobacco: Never Used  Substance Use Topics  . Alcohol use: Yes    Comment: "too much"   . Drug use: Never     Review of Systems  Constitutional: No fever/chills Eyes: No visual changes. No discharge ENT: No upper respiratory complaints. Cardiovascular: no chest pain. Respiratory: no cough. No SOB. Gastrointestinal: No abdominal pain.  No nausea, no vomiting.  No diarrhea.  No constipation. Genitourinary: Negative for dysuria. No hematuria Musculoskeletal: Negative for musculoskeletal pain. Skin: Patient has ulcer of  left toe.  Neurological: Negative for headaches, focal weakness or numbness.   ____________________________________________   PHYSICAL EXAM:  VITAL SIGNS: ED Triage Vitals  Enc Vitals Group     BP 06/12/19 1449 136/79     Pulse Rate 06/12/19 1449 99     Resp 06/12/19 1449 18     Temp 06/12/19 1449 98.7 F (37.1 C)     Temp Source 06/12/19 1449 Oral     SpO2 06/12/19 1449 97 %     Weight 06/12/19 1450 235 lb (106.6 kg)     Height 06/12/19 1450 6\' 3"  (1.905 m)     Head Circumference --      Peak Flow --      Pain Score 06/12/19 1450 6     Pain Loc --      Pain Edu? --      Excl. in [place]? --      Constitutional: Alert and oriented. Well appearing and in no acute distress. Eyes: Conjunctivae are normal. PERRL. EOMI. Head: Atraumatic. Cardiovascular: Normal rate, regular rhythm. Normal S1 and S2.  Good peripheral circulation. Respiratory: Normal respiratory effort without tachypnea or retractions. Lungs CTAB. Good air entry to the bases with no decreased or absent breath sounds. Musculoskeletal: Patient has 3+ pitting edema at left ankle. Neurologic:  Normal speech and language. No gross focal neurologic deficits are appreciated.  Skin: Patient's left second toe is edematous and erythematous with ulceration along the distal aspect of the digit. Psychiatric: Mood and affect are normal. Speech and behavior are normal. Patient exhibits appropriate insight and judgement.   ____________________________________________   LABS (all labs ordered are listed, but only abnormal results are displayed)  Labs Reviewed  COMPREHENSIVE METABOLIC PANEL - Abnormal; Notable for the following components:      Result Value   Glucose, Bld 227 (*)    Calcium 12.0 (*)    AST 108 (*)    ALT 99 (*)    Alkaline Phosphatase 143 (*)    All other components within normal limits  LACTIC ACID, PLASMA - Abnormal; Notable for the following components:   Lactic Acid, Venous 2.1 (*)    All other  components within normal limits  AEROBIC CULTURE (SUPERFICIAL SPECIMEN)  CBC WITH DIFFERENTIAL/PLATELET  LACTIC ACID, PLASMA   ____________________________________________  EKG   ____________________________________________  RADIOLOGY I personally viewed and evaluated these images as part of my medical decision making, as well as reviewing the written report by the radiologist.    US Venous Img Lower Unilateral Left  Result Date: 06/12/2019 CLINICAL DATA:  Patient with history of osteomyelitis. EXAM: LEFT LOWER EXTREMITY VENOUS DOPPLER ULTRASOUND TECHNIQUE: Gray-scale sonography with graded compression, as well as color Doppler and duplex ultrasound were performed to evaluate the lower extremity deep venous systems from the level of the common femoral vein and including the common femoral, femoral, profunda femoral, popliteal and calf veins including the posterior tibial, peroneal and gastrocnemius veins when visible. The superficial  great saphenous vein was also interrogated. Spectral Doppler was utilized to evaluate flow at rest and with distal augmentation maneuvers in the common femoral, femoral and popliteal veins. COMPARISON:  None. FINDINGS: Contralateral Common Femoral Vein: Respiratory phasicity is normal and symmetric with the symptomatic side. No evidence of thrombus. Normal compressibility. Common Femoral Vein: No evidence of thrombus. Normal compressibility, respiratory phasicity and response to augmentation. Saphenofemoral Junction: No evidence of thrombus. Normal compressibility and flow on color Doppler imaging. Profunda Femoral Vein: No evidence of thrombus. Normal compressibility and flow on color Doppler imaging. Femoral Vein: No evidence of thrombus. Normal compressibility, respiratory phasicity and response to augmentation. Popliteal Vein: No evidence of thrombus. Normal compressibility, respiratory phasicity and response to augmentation. Calf Veins: No evidence of thrombus.  Normal compressibility and flow on color Doppler imaging. Superficial Great Saphenous Vein: No evidence of thrombus. Normal compressibility. Venous Reflux:  None. Other Findings:  None. IMPRESSION: No evidence of deep venous thrombosis. Electronically Signed   By: Lovey Newcomer M.D.   On: 06/12/2019 17:10   Dg Foot Complete Left  Result Date: 06/12/2019 CLINICAL DATA:  Acute LEFT foot pain. Initial encounter. EXAM: LEFT FOOT - COMPLETE 3+ VIEW COMPARISON:  None. FINDINGS: A comminuted fracture of the great toe distal phalanx noted extending to the interphalangeal joint. A possible fracture of the 2nd toe distal phalanx is noted. No other fracture, subluxation or dislocation noted. The Lisfranc joints are unremarkable. IMPRESSION: Comminuted fracture of the great toe distal phalanx and possible fracture of the 2nd toe distal phalanx. Electronically Signed   By: Margarette Canada M.D.   On: 06/12/2019 15:56    ____________________________________________    PROCEDURES  Procedure(s) performed:    Procedures    Medications  sodium chloride 0.9 % bolus 1,000 mL (0 mLs Intravenous Stopped 06/12/19 1936)  cefTRIAXone (ROCEPHIN) 1 g in sodium chloride 0.9 % 100 mL IVPB (0 g Intravenous Stopped 06/12/19 1818)  sodium chloride 0.9 % bolus 1,000 mL (0 mLs Intravenous Stopped 06/12/19 1936)     ____________________________________________   INITIAL IMPRESSION / ASSESSMENT AND PLAN / ED COURSE  Pertinent labs & imaging results that were available during my care of the patient were reviewed by me and considered in my medical decision making (see chart for details).  Review of the [place] CSRS was performed in accordance of the [place] prior to dispensing any controlled drugs.           Assessment and Plan:  Diabetic Foot Ulcer:  53 year old male presents to the emergency department with a left second toe diabetic foot ulcer.  Vital signs were reassuring at triage.  Left second toe was edematous with  surrounding cellulitis and ulceration.  Differential diagnosis includes diabetic foot ulcer, osteomyelitis, cellulitis, sepsis.  No leukocytosis on CBC.  Initial lactic was 2.1 which trended down with 2 L of supplemental fluids.  Patient was given IV Rocephin in the emergency department.  X-ray examination of the left foot revealed no findings consistent with osteomyelitis.  Venous ultrasound of the left lower extremity was noncontributory for thromboembolism.    Patient was discharged with Keflex and Bactrim.  Patient was also given a referral to wound care.  Patient was advised to keep wound clean and dry and dressed.  Patient was hypertensive prior to being discharged which is to be expected given that he received 2 L of fluids and has a history of hypertension.  Patient's blood pressure was within reference range at triage.  Patient was advised to follow-up  with primary care if his blood pressure continues to be elevated.  Return precautions were given.  All patient questions were answered.    ____________________________________________  FINAL CLINICAL IMPRESSION(S) / ED DIAGNOSES  Final diagnoses:  Diabetic ulcer of toe of left foot associated with diabetes mellitus of other type, unspecified ulcer stage ([place])      NEW MEDICATIONS STARTED DURING THIS VISIT:  ED Discharge Orders         Ordered    sulfamethoxazole-trimethoprim (BACTRIM DS) 800-160 MG tablet  2 times daily     06/12/19 1955    cephALEXin (KEFLEX) 500 MG capsule  3 times daily     06/12/19 1955              This chart was dictated using voice recognition software/Dragon. Despite best efforts to proofread, errors can occur which can change the meaning. Any change was purely unintentional.    Lannie Fields, PA-C 06/12/19 2021    Duffy Bruce, MD 06/14/19 [phone]

## 2019-06-12 NOTE — ED Triage Notes (Signed)
Pt to ED with c/o of having a "hole" in the 2nd toe of his left foot. Pt has hx of diabetic.

## 2019-06-12 NOTE — ED Notes (Signed)
Pt with report of infected left 2nd toe. Left top of foot swollen, left 2nd toe with open circular wound at tip of toe. Toenail appears to be missing, possibly open to bone.

## 2019-06-12 NOTE — ED Notes (Signed)
Date and time results received: 06/12/19 1700 (use smartphrase ".now" to insert current time)  Test: Lactic Critical Value: 2.1  Name of Provider Notified: Kennyth Lose PA  Orders Received? Or Actions Taken?: Orders Received - See Orders for details

## 2019-06-14 ENCOUNTER — Other Ambulatory Visit
Admission: RE | Admit: 2019-06-14 | Discharge: 2019-06-14 | Disposition: A | Discharge status: Home / Self Care | Payer: BC Managed Care – PPO | Source: Ambulatory Visit | Attending: Physician Assistant | Admitting: Physician Assistant

## 2019-06-14 ENCOUNTER — Other Ambulatory Visit: Payer: Self-pay

## 2019-06-14 ENCOUNTER — Encounter: Payer: BC Managed Care – PPO | Attending: Physician Assistant | Admitting: Physician Assistant

## 2019-06-14 DIAGNOSIS — I10 Essential (primary) hypertension: Secondary | ICD-10-CM | POA: Insufficient documentation

## 2019-06-14 DIAGNOSIS — M86672 Other chronic osteomyelitis, left ankle and foot: Secondary | ICD-10-CM | POA: Insufficient documentation

## 2019-06-14 DIAGNOSIS — Z89421 Acquired absence of other right toe(s): Secondary | ICD-10-CM | POA: Diagnosis not present

## 2019-06-14 DIAGNOSIS — E1169 Type 2 diabetes mellitus with other specified complication: Secondary | ICD-10-CM | POA: Diagnosis not present

## 2019-06-14 DIAGNOSIS — L97512 Non-pressure chronic ulcer of other part of right foot with fat layer exposed: Secondary | ICD-10-CM | POA: Insufficient documentation

## 2019-06-14 DIAGNOSIS — L97522 Non-pressure chronic ulcer of other part of left foot with fat layer exposed: Secondary | ICD-10-CM | POA: Insufficient documentation

## 2019-06-14 DIAGNOSIS — E11621 Type 2 diabetes mellitus with foot ulcer: Secondary | ICD-10-CM | POA: Diagnosis present

## 2019-06-14 DIAGNOSIS — F17200 Nicotine dependence, unspecified, uncomplicated: Secondary | ICD-10-CM | POA: Diagnosis not present

## 2019-06-14 DIAGNOSIS — E1142 Type 2 diabetes mellitus with diabetic polyneuropathy: Secondary | ICD-10-CM | POA: Insufficient documentation

## 2019-06-14 DIAGNOSIS — B999 Unspecified infectious disease: Secondary | ICD-10-CM | POA: Insufficient documentation

## 2019-06-14 NOTE — Progress Notes (Addendum)
[name], [name] ([id number]) Visit Report for 06/14/2019 Allergy List Details Patient Name: [name], [name] Date of Service: 06/14/2019 1:15 PM Medical Record Number: [id number] Patient Account Number: [id number] Date of Birth/Sex: 11-28-1965 (53 y.o. M) Treating RN: Army Melia Primary Care Gola Bribiesca: Johny Drilling Other Clinician: Referring Stuart Guillen: Johny Drilling Treating Shyquan Stallbaumer/Extender: Melburn Hake, HOYT Weeks in Treatment: 0 Allergies Active Allergies No Known Allergies Allergy Notes Electronic Signature(s) Signed: 06/14/2019 2:46:54 PM By: Army Melia Entered By: Army Melia on 06/14/2019 13:29:53 [name] ([id number]) -------------------------------------------------------------------------------- Arrival Information Details Patient Name: [name] Date of Service: 06/14/2019 1:15 PM Medical Record Number: [id number] Patient Account Number: [id number] Date of Birth/Sex: [birth year] (53 y.o. M) Treating RN: Army Melia Primary Care Dawson Hollman: Johny Drilling Other Clinician: Referring Glade Strausser: Johny Drilling Treating Whitnee Orzel/Extender: Melburn Hake, HOYT Weeks in Treatment: 0 Visit Information Patient Arrived: Ambulatory Arrival Time: 13:25 Accompanied By: wife Transfer Assistance: None Patient Identification Verified: Yes Electronic Signature(s) Signed: 06/14/2019 2:46:54 PM By: Army Melia Entered By: Army Melia on 06/14/2019 13:25:18 [name] ([id number]) -------------------------------------------------------------------------------- Clinic Level of Care Assessment Details Patient Name: [name] Date of Service: 06/14/2019 1:15 PM Medical Record Number: [id number] Patient Account Number: [id number] Date of Birth/Sex: 01-07-1966 (53 y.o. M) Treating RN: Montey Hora Primary Care Jacion Dismore: Johny Drilling Other Clinician: Referring Mekia Dipinto: Johny Drilling Treating Tremaine Earwood/Extender: Melburn Hake, HOYT Weeks in Treatment: 0 Clinic Level of Care Assessment Items TOOL  1 Quantity Score []  - Use when EandM and Procedure is performed on INITIAL visit 0 ASSESSMENTS - Nursing Assessment / Reassessment X - General Physical Exam (combine w/ comprehensive assessment (listed just below) when 1 20 performed on new pt. evals) X- 1 25 Comprehensive Assessment (HX, ROS, Risk Assessments, Wounds Hx, etc.) ASSESSMENTS - Wound and Skin Assessment / Reassessment []  - Dermatologic / Skin Assessment (not related to wound area) 0 ASSESSMENTS - Ostomy and/or Continence Assessment and Care []  - Incontinence Assessment and Management 0 []  - 0 Ostomy Care Assessment and Management (repouching, etc.) PROCESS - Coordination of Care X - Simple Patient / Family Education for ongoing care 1 15 []  - 0 Complex (extensive) Patient / Family Education for ongoing care X- 1 10 Staff obtains Programmer, systems, Records, Test Results / Process Orders []  - 0 Staff telephones HHA, Nursing Homes / Clarify orders / etc []  - 0 Routine Transfer to another Facility (non-emergent condition) []  - 0 Routine Hospital Admission (non-emergent condition) X- 1 15 New Admissions / Biomedical engineer / Ordering NPWT, Apligraf, etc. []  - 0 Emergency Hospital Admission (emergent condition) PROCESS - Special Needs []  - Pediatric / Minor Patient Management 0 []  - 0 Isolation Patient Management []  - 0 Hearing / Language / Visual special needs []  - 0 Assessment of Community assistance (transportation, D/C planning, etc.) []  - 0 Additional assistance / Altered mentation []  - 0 Support Surface(s) Assessment (bed, cushion, seat, etc.) [name], [name] ([id number]) INTERVENTIONS - Miscellaneous []  - External ear exam 0 []  - 0 Patient Transfer (multiple staff / Civil Service fast streamer / Similar devices) []  - 0 Simple Staple / Suture removal (25 or less) []  - 0 Complex Staple / Suture removal (26 or more) []  - 0 Hypo/Hyperglycemic Management (do not check if billed separately) X- 1 15 Ankle / Brachial Index (ABI)  - do not check if billed separately Has the patient been seen at the hospital within the last three years: Yes Total Score: 100 Level Of Care: New/Established - Level 3 Electronic Signature(s) Signed: 06/14/2019 3:57:57 PM By: Montey Hora Entered By: Montey Hora  on 06/14/2019 14:28:16 [name], [name] ([id number]) -------------------------------------------------------------------------------- Encounter Discharge Information Details Patient Name: JONIS, DEANE Date of Service: 06/14/2019 1:15 PM Medical Record Number: [id number] Patient Account Number: [id number] Date of Birth/Sex: June 02, 1966 (53 y.o. M) Treating RN: Montey Hora Primary Care Karnell Vanderloop: Johny Drilling Other Clinician: Referring Tsuneo Faison: Johny Drilling Treating Tahj Njoku/Extender: Melburn Hake, HOYT Weeks in Treatment: 0 Encounter Discharge Information Items Post Procedure Vitals Discharge Condition: Stable Temperature (F): 99.4 Ambulatory Status: Ambulatory Pulse (bpm): 90 Discharge Destination: Home Respiratory Rate (breaths/min): 16 Transportation: Private Auto Blood Pressure (mmHg): 182/97 Accompanied By: wife Schedule Follow-up Appointment: Yes Clinical Summary of Care: Electronic Signature(s) Signed: 06/14/2019 3:57:57 PM By: Montey Hora Entered By: Montey Hora on 06/14/2019 14:31:55 [name] ([id number]) -------------------------------------------------------------------------------- Lower Extremity Assessment Details Patient Name: [name] Date of Service: 06/14/2019 1:15 PM Medical Record Number: [id number] Patient Account Number: [id number] Date of Birth/Sex: 1965-12-02 (53 y.o. M) Treating RN: Army Melia Primary Care Tushar Enns: Johny Drilling Other Clinician: Referring Preeya Cleckley: Johny Drilling Treating Bentleigh Stankus/Extender: Melburn Hake, HOYT Weeks in Treatment: 0 Edema Assessment Assessed: [Left: No] [Right: No] Edema: [Left: Yes] [Right: No] Calf Left: Right: Point of Measurement: 37 cm From  Medial Instep 37 cm 38 cm Ankle Left: Right: Point of Measurement: 10 cm From Medial Instep 29 cm 26 cm Vascular Assessment Pulses: Dorsalis Pedis Palpable: [Left:Yes] [Right:Yes] Posterior Tibial Palpable: [Left:Yes] [Right:Yes] Doppler Audible: [Left:Yes] [Right:Yes] Blood Pressure: Brachial: [Left:150] Dorsalis Pedis: 160 Ankle: Posterior Tibial: 182 Ankle Brachial Index: [Left:1.21] Notes Right leg non-compressible Electronic Signature(s) Signed: 06/14/2019 2:46:54 PM By: Army Melia Entered By: Army Melia on 06/14/2019 13:46:20 [name] ([id number]) -------------------------------------------------------------------------------- Multi Wound Chart Details Patient Name: [name] Date of Service: 06/14/2019 1:15 PM Medical Record Number: [id number] Patient Account Number: [id number] Date of Birth/Sex: Oct 15, 1965 (53 y.o. 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 Comorbid History: Cataracts, Hypertension, Type Cataracts, Hypertension, Type N/A II Diabetes II Diabetes Date Acquired: 05/23/2019 05/30/2019 N/A Weeks of Treatment: 0 0 N/A Wound Status: Open Open N/A Pending Amputation on Yes Yes N/A Presentation: Measurements L x W x D 0.4x0.3x0.4 1.6x2.1x0.8 N/A (cm) Area (cm) : 0.094 2.639 N/A Volume (cm) : 0.038 2.111 N/A % Reduction in Area: N/A 0.00% N/A % Reduction in Volume: N/A 0.00% N/A Classification:  Grade 1 Grade 2 N/A Exudate Amount: Medium Medium N/A Exudate Type: Serosanguineous Serosanguineous N/A Exudate Color: red, brown red, brown N/A Granulation Amount: Small (1-33%) Small (1-33%) N/A Granulation Quality: Red Red N/A Necrotic Amount: Large (67-100%) Large (67-100%) N/A Necrotic Tissue: Eschar, Adherent Slough Eschar, Adherent Slough N/A Exposed Structures: Fat Layer (Subcutaneous Fat Layer (Subcutaneous N/A Tissue) Exposed: Yes Tissue) Exposed: Yes Fascia: No Bone: Yes Tendon: No Fascia: No [name], [name] ([id number]) Muscle: No Tendon: No Joint: No Muscle: No Bone: No Joint: No Epithelialization: None None N/A Treatment Notes Electronic Signature(s) Signed: 06/14/2019 3:57:57 PM By: Montey Hora Entered By: Montey Hora on 06/14/2019 14:05:53 [name] ([id number]) -------------------------------------------------------------------------------- Multi-Disciplinary Care Plan Details Patient Name: [name] Date of Service: 06/14/2019 1:15 PM Medical Record Number: [id number] Patient Account Number: [id number] Date of Birth/Sex: Feb 26, 1966 (53 y.o. M)  Treating RN: Montey Hora Primary Care Kieley Akter: Johny Drilling Other Clinician: Referring Yuritza Paulhus: Johny Drilling Treating Leisl Spurrier/Extender: Melburn Hake, HOYT Weeks in Treatment: 0 Active Inactive HBO Nursing Diagnoses: Anxiety related to feelings of confinement associated with the hyperbaric oxygen chamber Anxiety related to knowledge deficit of hyperbaric oxygen therapy and treatment procedures Discomfort related to temperature and humidity changes inside hyperbaric chamber Potential for barotraumas to ears, sinuses, teeth, and lungs or cerebral gas embolism related to changes in atmospheric pressure inside hyperbaric oxygen chamber Potential for oxygen toxicity seizures related to delivery of 100% oxygen at an increased atmospheric pressure Potential for pulmonary oxygen toxicity related to delivery of  100% oxygen at an increased atmospheric pressure Goals: Barotrauma will be prevented during HBO2 Date Initiated: 06/23/2019 Target Resolution Date: 06/30/2019 Goal Status: Active Patient and/or family will be able to state/discuss factors appropriate to the management of their disease process during treatment Date Initiated: 06/23/2019 Target Resolution Date: 06/30/2019 Goal Status: Active Patient will tolerate the hyperbaric oxygen therapy treatment Date Initiated: 06/23/2019 Target Resolution Date: 06/30/2019 Goal Status: Active Patient will tolerate the internal climate of the chamber Date Initiated: 06/23/2019 Target Resolution Date: 06/30/2019 Goal Status: Active Patient/caregiver will verbalize understanding of HBO goals, rationale, procedures and potential hazards Date Initiated: 06/23/2019 Target Resolution Date: 06/30/2019 Goal Status: Active Signs and symptoms of pulmonary oxygen toxicity will be recognized and promptly addressed Date Initiated: 06/23/2019 Target Resolution Date: 06/30/2019 Goal Status: Active Signs and symptoms of seizure will be recognized and promptly addressed ; seizing patients will suffer no harm Date Initiated: 06/23/2019 Target Resolution Date: 06/30/2019 Goal Status: Active Interventions: Administer a five (5) minute air break for patient if signs and symptoms of seizure appear and notify the hyperbaric physician Administer the correct therapeutic gas delivery based on the patients needs and limitations, per physician order Assess and provide for patientos comfort related to the hyperbaric environment and equalization of middle ear [name], [name] ([id number]) Assess for signs and symptoms related to adverse events, including but not limited to confinement anxiety, pneumothorax, oxygen toxicity and baurotrauma Assess patient for any history of confinement anxiety Assess patient's knowledge and expectations regarding hyperbaric medicine and provide education  related to the hyperbaric environment, goals of treatment and prevention of adverse events Implement protocols to decrease risk of pneumothorax in high risk patients Notes: Abuse / Safety / Falls / Self Care Management Nursing Diagnoses: Potential for falls Goals: Patient will not experience any injury related to falls Date Initiated: 06/14/2019 Target Resolution Date: 09/03/2019 Goal Status: Active Interventions: Assess fall risk on admission and as needed Notes: Necrotic Tissue Nursing Diagnoses: Knowledge deficit related to management of necrotic/devitalized tissue Goals: Patient/caregiver will verbalize understanding of reason and process for debridement of necrotic tissue Date Initiated: 06/14/2019 Target Resolution Date: 09/03/2019 Goal Status: Active Interventions: Assess patient pain level pre-, during and post procedure and prior to discharge Notes: Orientation to the Wound Care Program Nursing Diagnoses: Knowledge deficit related to the wound healing center program Goals: Patient/caregiver will verbalize understanding of the [facility] Date Initiated: 06/14/2019 Target Resolution Date: 09/03/2019 Goal Status: Active Interventions: Provide education on orientation to the wound center Notes: Peripheral Neuropathy [name], [name] ([id number]) Nursing Diagnoses: Potential alteration in peripheral tissue perfusion (select prior to confirmation of diagnosis) Goals: Patient/caregiver will verbalize understanding of disease process and disease management Date Initiated: 06/14/2019 Target Resolution Date: 09/03/2019 Goal Status: Active Interventions: Assess signs and symptoms of neuropathy upon admission and as needed Notes: Wound/Skin Impairment Nursing Diagnoses: Impaired tissue  integrity Goals: Ulcer/skin breakdown will heal within 14 weeks Date Initiated: 06/14/2019 Target Resolution Date: 09/03/2019 Goal Status: Active Interventions: Assess  patient/caregiver ability to obtain necessary supplies Assess patient/caregiver ability to perform ulcer/skin care regimen upon admission and as needed Assess ulceration(s) every visit Notes: Electronic Signature(s) Signed: 06/23/2019 9:41:19 AM By: Gretta Cool, BSN, RN, CWS, Kim RN, BSN Signed: 08/17/2019 4:48:32 PM By: Montey Hora Previous Signature: 06/14/2019 3:57:57 PM Version By: Montey Hora Entered By: Gretta Cool BSN, RN, CWS, Kim on 06/23/2019 09:41:18 [name] ([id number]) -------------------------------------------------------------------------------- Pain Assessment Details Patient Name: [name] Date of Service: 06/14/2019 1:15 PM Medical Record Number: [id number] Patient Account Number: [id number] Date of Birth/Sex: 16-Jun-1966 (53 y.o. M) Treating RN: Army Melia Primary Care Yessenia Maillet: Johny Drilling Other Clinician: Referring Giovanni Bath: Johny Drilling Treating Ady Heimann/Extender: Melburn Hake, HOYT Weeks in Treatment: 0 Active Problems Location of Pain Severity and Description of Pain Patient Has Paino No Site Locations Pain Management and Medication Current Pain Management: Electronic Signature(s) Signed: 06/14/2019 2:46:54 PM By: Army Melia Entered By: Army Melia on 06/14/2019 13:28:47 [name] ([id number]) -------------------------------------------------------------------------------- Patient/Caregiver Education Details Patient Name: [name] Date of Service: 06/14/2019 1:15 PM Medical Record Number: [id number] Patient Account Number: [id number] Date of Birth/Gender: [birth year] (53 y.o. M) Treating RN: Montey Hora Primary Care Physician: Johny Drilling Other Clinician: Referring Physician: Johny Drilling Treating Physician/Extender: Sharalyn Ink in Treatment: 0 Education Assessment Education Provided To: Patient and Caregiver Education Topics Provided Wound/Skin Impairment: Handouts: Other: wound care as ordered Methods: Demonstration,  Explain/Verbal Responses: State content correctly Electronic Signature(s) Signed: 06/14/2019 3:57:57 PM By: Montey Hora Entered By: Montey Hora on 06/14/2019 14:28:33 [name] ([id number]) -------------------------------------------------------------------------------- Wound Assessment Details Patient Name: [name] Date of Service: 06/14/2019 1:15 PM Medical Record Number: [id number] Patient Account Number: [id number] Date of Birth/Sex: 1965-11-21 (53 y.o. M) Treating RN: Army Melia Primary Care Quention Mcneill: Johny Drilling Other Clinician: Referring Shenea Giacobbe: Johny Drilling Treating Osa Campoli/Extender: Melburn Hake, HOYT Weeks in Treatment: 0 Wound Status Wound Number: 1 Primary Etiology: Diabetic Wound/Ulcer of the Lower Extremity Wound Location: Right Toe Great Wound Status: Open Wounding Event: Gradually Appeared Comorbid Cataracts, Hypertension, Type II Diabetes Date Acquired: 05/23/2019 History: Weeks Of Treatment: 0 Clustered Wound: No Photos Wound Measurements Length: (cm) 0.4 % Reduction Width: (cm) 0.3 % Reduction Depth: (cm) 0.4 Epithelializ Area: (cm) 0.094 Tunneling: Volume: (cm) 0.038 Undermining in Area: in Volume: ation: None No : No Wound Description Classification: Grade 1 Foul Odor A Exudate Amount: Medium Slough/Fibr Exudate Type: Serosanguineous Exudate Color: red, brown fter Cleansing: No ino Yes Wound Bed Granulation Amount: Small (1-33%) Exposed Structure Granulation Quality: Red Fascia Exposed: No Necrotic Amount: Large (67-100%) Fat Layer (Subcutaneous Tissue) Exposed: Yes Necrotic Quality: Eschar, Adherent Slough Tendon Exposed: No Muscle Exposed: No Joint Exposed: No Bone Exposed: No Electronic Signature(s) Signed: 06/14/2019 2:46:54 PM By: Kurtis Bushman ([id number]) Entered By: Army Melia on 06/14/2019 13:40:30 [name]  ([id number]) -------------------------------------------------------------------------------- Wound Assessment Details Patient Name: [name] Date of Service: 06/14/2019 1:15 PM Medical Record Number: [id number] Patient Account Number: [id number] Date of Birth/Sex: 04-Nov-1965 (53 y.o. M) Treating RN: Army Melia Primary Care Duru Reiger: Johny Drilling Other Clinician: Referring Araceli Arango: Johny Drilling Treating Asim Gersten/Extender: Melburn Hake, HOYT Weeks in Treatment: 0 Wound Status Wound Number: 2 Primary Etiology: Diabetic Wound/Ulcer of the Lower Extremity Wound Location: Left Toe Second Wound Status: Open Wounding Event: Gradually Appeared Comorbid Cataracts, Hypertension, Type II Diabetes Date Acquired: 05/30/2019 History: Weeks Of Treatment: 0 Clustered Wound: No Photos Wound Measurements Length: (cm)  1.6 % Reduction Width: (cm) 2.1 % Reduction Depth: (cm) 0.8 Epithelializ Area: (cm) 2.639 Tunneling: Volume: (cm) 2.111 Undermining in Area: 0% in Volume: 0% ation: None No : No Wound Description Classification: Grade 2 Foul Odor A Exudate Amount: Medium Slough/Fibr Exudate Type: Serosanguineous Exudate Color: red, brown fter Cleansing: No ino Yes Wound Bed Granulation Amount: Small (1-33%) Exposed Structure Granulation Quality: Red Fascia Exposed: No Necrotic Amount: Large (67-100%) Fat Layer (Subcutaneous Tissue) Exposed: Yes Necrotic Quality: Eschar, Adherent Slough Tendon Exposed: No Muscle Exposed: No Joint Exposed: No Bone Exposed: Yes Electronic Signature(s) Signed: 06/14/2019 2:46:54 PM By: Kurtis Bushman ([id number]) Entered By: Army Melia on 06/14/2019 13:42:45 [name] ([id number]) -------------------------------------------------------------------------------- Vitals Details Patient Name: [name] Date of Service: 06/14/2019 1:15 PM Medical Record Number: [id number] Patient Account Number: [id number] Date of Birth/Sex:  [birth year] (53 y.o. M) Treating RN: Army Melia Primary Care Winston Misner: Johny Drilling Other Clinician: Referring Dianara Smullen: Johny Drilling Treating Lively Haberman/Extender: Melburn Hake, HOYT Weeks in Treatment: 0 Vital Signs Time Taken: 13:30 Temperature (F): 99.4 Height (in): 75 Pulse (bpm): 90 Source: Stated Respiratory Rate (breaths/min): 16 Weight (lbs): 240 Blood Pressure (mmHg): 182/97 Source: Stated Reference Range: 80 - 120 mg / dl Body Mass Index (BMI): 30 Electronic Signature(s) Signed: 06/14/2019 2:46:54 PM By: Army Melia Entered By: Army Melia on 06/14/2019 13:38:18

## 2019-06-14 NOTE — Progress Notes (Signed)
[name], [name] ([id number]) Visit Report for 06/14/2019 Abuse/Suicide Risk Screen Details Patient Name: [name], [name] Date of Service: 06/14/2019 1:15 PM Medical Record Number: [id number] Patient Account Number: [id number] Date of Birth/Sex: 08/21/1966 (53 y.o. M) Treating RN: Army Melia Primary Care Aziyah Provencal: Johny Drilling Other Clinician: Referring Brandy Kabat: Johny Drilling Treating Conroy Goracke/Extender: Melburn Hake, HOYT Weeks in Treatment: 0 Abuse/Suicide Risk Screen Items Answer ABUSE RISK SCREEN: Has anyone close to you tried to hurt or harm you recentlyo No Do you feel uncomfortable with anyone in your familyo No Has anyone forced you do things that you didnot want to doo No Electronic Signature(s) Signed: 06/14/2019 2:46:54 PM By: Army Melia Entered By: Army Melia on 06/14/2019 13:34:46 [name] ([id number]) -------------------------------------------------------------------------------- Activities of Daily Living Details Patient Name: [name] Date of Service: 06/14/2019 1:15 PM Medical Record Number: [id number] Patient Account Number: [id number] Date of Birth/Sex: [birth year] (53 y.o. M) Treating RN: Army Melia Primary Care Javonta Gronau: Johny Drilling Other Clinician: Referring Pamalee Marcoe: Johny Drilling Treating Kahil Agner/Extender: Melburn Hake, HOYT Weeks in Treatment: 0 Activities of Daily Living Items Answer Activities of Daily Living (Please select one for each item) Drive Automobile Completely Able Take Medications Completely Able Use Telephone Completely Able Care for Appearance Completely Able Use Toilet Completely Able Bath / Shower Completely Able Dress Self Completely Able Feed Self Completely Able Walk Completely Able Get In / Out Bed Completely Able Housework Completely Able Prepare Meals Completely [place] for Self Completely Able Electronic Signature(s) Signed: 06/14/2019 2:46:54 PM By: Army Melia Entered By: Army Melia on  06/14/2019 13:34:58 [name] ([id number]) -------------------------------------------------------------------------------- Education Screening Details Patient Name: [name] Date of Service: 06/14/2019 1:15 PM Medical Record Number: [id number] Patient Account Number: [id number] Date of Birth/Sex: 08-25-1966 (53 y.o. M) Treating RN: Army Melia Primary Care Celena Lanius: Johny Drilling Other Clinician: Referring Genevive Printup: Johny Drilling Treating Mariadelcarmen Corella/Extender: Melburn Hake, HOYT Weeks in Treatment: 0 Primary Learner Assessed: Patient Learning Preferences/Education Level/Primary Language Learning Preference: Explanation, Demonstration Highest Education Level: College or Above Preferred Language: English Cognitive Barrier Language Barrier: No Translator Needed: No Memory Deficit: No Emotional Barrier: No Cultural/Religious Beliefs Affecting Medical Care: No Physical Barrier Impaired Vision: No Impaired Hearing: No Decreased Hand dexterity: No Knowledge/Comprehension Knowledge Level: High Comprehension Level: High Ability to understand written High instructions: Ability to understand verbal High instructions: Motivation Anxiety Level: Calm Cooperation: Cooperative Education Importance: Acknowledges Need Interest in Health Problems: Asks Questions Perception: Coherent Willingness to Engage in Self- High Management Activities: Readiness to Engage in Self- High Management Activities: Electronic Signature(s) Signed: 06/14/2019 2:46:54 PM By: Army Melia Entered By: Army Melia on 06/14/2019 13:35:20 [name] ([id number]) -------------------------------------------------------------------------------- Fall Risk Assessment Details Patient Name: [name] Date of Service: 06/14/2019 1:15 PM Medical Record Number: [id number] Patient Account Number: [id number] Date of Birth/Sex: 06-26-1966 (53 y.o. M) Treating RN: Army Melia Primary Care Tamarcus Condie: Johny Drilling  Other Clinician: Referring Areatha Kalata: Johny Drilling Treating Rondia Higginbotham/Extender: Melburn Hake, HOYT Weeks in Treatment: 0 Fall Risk Assessment Items Have you had 2 or more falls in the last 12 monthso 0 No Have you had any fall that resulted in injury in the last 12 monthso 0 No FALLS RISK SCREEN History of falling - immediate or within 3 months 0 No Secondary diagnosis (Do you have 2 or more medical diagnoseso) 0 No Ambulatory aid None/bed rest/wheelchair/nurse 0 No Crutches/cane/walker 0 No Furniture 0 No Intravenous therapy Access/Saline/Heparin Lock 0 No Gait/Transferring Normal/ bed rest/ wheelchair 0 No Weak (short steps with or without shuffle, stooped  but able to lift head while 0 No walking, may seek support from furniture) Impaired (short steps with shuffle, may have difficulty arising from chair, head 0 No down, impaired balance) Mental Status Oriented to own ability 0 No Electronic Signature(s) Signed: 06/14/2019 2:46:54 PM By: Army Melia Entered By: Army Melia on 06/14/2019 13:35:26 [name] ([id number]) -------------------------------------------------------------------------------- Foot Assessment Details Patient Name: [name] Date of Service: 06/14/2019 1:15 PM Medical Record Number: [id number] Patient Account Number: [id number] Date of Birth/Sex: [birth year] (53 y.o. M) Treating RN: Army Melia Primary Care Harjot Zavadil: Johny Drilling Other Clinician: Referring Colbey Wirtanen: Johny Drilling Treating Bonnita Newby/Extender: Melburn Hake, HOYT Weeks in Treatment: 0 Foot Assessment Items Site Locations + = Sensation present, - = Sensation absent, C = Callus, U = Ulcer R = Redness, W = Warmth, M = Maceration, PU = Pre-ulcerative lesion F = Fissure, S = Swelling, D = Dryness Assessment Right: Left: Other Deformity: No No Prior Foot Ulcer: No No Prior Amputation: No No Charcot Joint: No No Ambulatory Status: Ambulatory Without Help Gait: Steady Electronic  Signature(s) Signed: 06/14/2019 2:46:54 PM By: Army Melia Entered By: Army Melia on 06/14/2019 13:37:32 [name] ([id number]) -------------------------------------------------------------------------------- Nutrition Risk Screening Details Patient Name: [name] Date of Service: 06/14/2019 1:15 PM Medical Record Number: [id number] Patient Account Number: [id number] Date of Birth/Sex: 1966-04-18 (53 y.o. M) Treating RN: Army Melia Primary Care Tylin Stradley: Johny Drilling Other Clinician: Referring Shameka Aggarwal: Johny Drilling Treating Almeter Westhoff/Extender: Melburn Hake, HOYT Weeks in Treatment: 0 Height (in): Weight (lbs): Body Mass Index (BMI): Nutrition Risk Screening Items Score Screening NUTRITION RISK SCREEN: I have an illness or condition that made me change the kind and/or amount of 0 No food I eat I eat fewer than two meals per day 0 No I eat few fruits and vegetables, or milk products 0 No I have three or more drinks of beer, liquor or wine almost every day 0 No I have tooth or mouth problems that make it hard for me to eat 0 No I don't always have enough money to buy the food I need 0 No I eat alone most of the time 0 No I take three or more different prescribed or over-the-counter drugs a day 0 No Without wanting to, I have lost or gained 10 pounds in the last six months 0 No I am not always physically able to shop, cook and/or feed myself 0 No Nutrition Protocols Good Risk Protocol 0 No interventions needed Moderate Risk Protocol High Risk Proctocol Risk Level: Good Risk Score: 0 Electronic Signature(s) Signed: 06/14/2019 2:46:54 PM By: Army Melia Entered By: Army Melia on 06/14/2019 13:35:34

## 2019-06-15 ENCOUNTER — Other Ambulatory Visit: Payer: Self-pay | Admitting: Physician Assistant

## 2019-06-15 DIAGNOSIS — L97522 Non-pressure chronic ulcer of other part of left foot with fat layer exposed: Secondary | ICD-10-CM

## 2019-06-15 LAB — AEROBIC CULTURE W GRAM STAIN (SUPERFICIAL SPECIMEN)
Culture: NO GROWTH
Gram Stain: NONE SEEN

## 2019-06-16 NOTE — Progress Notes (Signed)
[name], [name] ([id number]) Visit Report for 06/14/2019 Chief Complaint Document Details Patient Name: [name], [name] Date of Service: 06/14/2019 1:15 PM Medical Record Number: [id number] Patient Account Number: [id number] Date of Birth/Sex: [birth year] (53 y.o. M) Treating RN: Montey Hora Primary Care Provider: Johny Drilling Other Clinician: Referring Provider: Johny Drilling Treating Provider/Extender: Melburn Hake, HOYT Weeks in Treatment: 0 Information Obtained from: Patient Chief Complaint Bilateral foot ulcers Electronic Signature(s) Signed: 06/14/2019 1:54:15 PM By: Worthy Keeler PA-C Entered By: Worthy Keeler on 06/14/2019 13:54:15 [name] ([id number]) -------------------------------------------------------------------------------- Debridement Details Patient Name: [name] Date of Service: 06/14/2019 1:15 PM Medical Record Number: [id number] Patient Account Number: [id number] Date of Birth/Sex: [birth year] (53 y.o. M) Treating RN: Montey Hora Primary Care Provider: Johny Drilling Other Clinician: Referring Provider: Johny Drilling Treating Provider/Extender: Melburn Hake, HOYT Weeks in Treatment: 0 Debridement Performed for Wound #1 Right Toe Great Assessment: Performed By: Physician STONE III, HOYT E., PA-C Debridement Type: Debridement Severity of Tissue Pre Fat layer exposed Debridement: Level of Consciousness (Pre- Awake and Alert procedure): Pre-procedure Verification/Time Yes - 14:11 Out Taken: Start Time: 14:11 Pain Control: Lidocaine 4% Topical Solution Total Area Debrided (L x W): 0.4 (cm) x 0.3 (cm) = 0.12 (cm) Tissue and other material Viable, Non-Viable, Slough, Subcutaneous, Slough debrided: Level: Skin/Subcutaneous Tissue Debridement Description: Excisional Instrument: Curette Bleeding: Minimum Hemostasis Achieved: Pressure End Time: 14:13 Procedural Pain: 0 Post Procedural Pain: 0 Response to Treatment: Procedure was tolerated well Level  of Consciousness Awake and Alert (Post-procedure): Post Debridement Measurements of Total Wound Length: (cm) 0.4 Width: (cm) 0.3 Depth: (cm) 0.5 Volume: (cm) 0.047 Character of Wound/Ulcer Post Debridement: Improved Severity of Tissue Post Debridement: Fat layer exposed Post Procedure Diagnosis Same as Pre-procedure Electronic Signature(s) Signed: 06/14/2019 3:57:57 PM By: Montey Hora Signed: 06/16/2019 2:05:35 AM By: Worthy Keeler PA-C Entered By: Montey Hora on 06/14/2019 14:13:30 [name] ([id number]) -------------------------------------------------------------------------------- Debridement Details Patient Name: [name] Date of Service: 06/14/2019 1:15 PM Medical Record Number: [id number] Patient Account Number: [id number] Date of Birth/Sex: 08-Sep-1966 (53 y.o. M) Treating RN: Montey Hora Primary Care Provider: Johny Drilling Other Clinician: Referring Provider: Johny Drilling Treating Provider/Extender: Melburn Hake, HOYT Weeks in Treatment: 0 Debridement Performed for Wound #2 Left Toe Second Assessment: Performed By: Physician STONE III, HOYT E., PA-C Debridement Type: Debridement Severity of Tissue Pre Necrosis of bone Debridement: Level of Consciousness (Pre- Awake and Alert procedure): Pre-procedure Verification/Time Yes - 14:20 Out Taken: Start Time: 14:20 Pain Control: Lidocaine 4% Topical Solution Total Area Debrided (L x W): 1.6 (cm) x 2.1 (cm) = 3.36 (cm) Tissue and other material Viable, Non-Viable, Bone, Slough, Subcutaneous, Slough debrided: Level: Skin/Subcutaneous Tissue/Muscle/Bone Debridement Description: Excisional Instrument: Rongeur Specimen: Tissue Culture Number of Specimens Taken: 2 Bleeding: Minimum Hemostasis Achieved: Pressure End Time: 14:24 Procedural Pain: 0 Post Procedural Pain: 0 Response to Treatment: Procedure was tolerated well Level of Consciousness Awake and Alert (Post-procedure): Post Debridement  Measurements of Total Wound Length: (cm) 1.6 Width: (cm) 2.1 Depth: (cm) 1 Volume: (cm) 2.639 Character of Wound/Ulcer Post Debridement: Improved Severity of Tissue Post Debridement: Necrosis of bone Post Procedure Diagnosis Same as Pre-procedure Electronic Signature(s) Signed: 06/14/2019 3:57:57 PM By: Montey Hora Signed: 06/16/2019 2:05:35 AM By: Worthy Keeler PA-C Entered By: Montey Hora on 06/14/2019 14:25:29 [name] ([id number]) [name] ([id number]) -------------------------------------------------------------------------------- HPI Details Patient Name: [name] Date of Service: 06/14/2019 1:15 PM Medical Record Number: [id number] Patient Account Number: [id number] Date of Birth/Sex: 27-May-1966 (53 y.o. M) Treating RN: Montey Hora Primary  Care Provider: Johny Drilling Other Clinician: Referring Provider: Johny Drilling Treating Provider/Extender: Melburn Hake, HOYT Weeks in Treatment: 0 History of Present Illness HPI Description: 06/14/2019 on evaluation today patient appears to be doing somewhat poorly in regard to his bilateral feet. 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 He could not give me an exact time. With that being said he currently has an issue with his right great toe on the dorsal surface where he has an open wound. On the distal end of his left second toe he has an open wound with bone exposed and the bone does appear to be necrotic in my opinion it does not appear to be as firm as should be. With that being said he also has an x-ray which was taken on the visit to the ER this past Sunday, 06/12/2019 on that x-ray it appears that he has a comminuted fracture of the left great toe and a distal fracture of the left second toe. Again my suspicion is may be secondary to issues with osteomyelitis he has not had  any injury to the area. 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 He does have a history of diabetes mellitus type 2, peripheral neuropathy, again a right fifth toe amputation, and hypertension. Electronic Signature(s) Signed: 06/14/2019 5:06:20 PM By: Worthy Keeler PA-C Entered By: Worthy Keeler on 06/14/2019 17:06:20 [name], [name] ([id number]) -------------------------------------------------------------------------------- Physical Exam Details Patient Name: [name] Date of Service: 06/14/2019 1:15 PM Medical Record Number: [id number] Patient Account Number: [id number] Date of Birth/Sex: [birth year] (53 y.o. M) Treating RN: Montey Hora Primary Care Provider: Johny Drilling Other Clinician: Referring Provider: Johny Drilling Treating Provider/Extender: Melburn Hake, HOYT Weeks in Treatment: 0 Constitutional patient is hypertensive.. pulse regular and within target range for patient.Marland Kitchen respirations regular, non-labored and within target range for patient.Marland Kitchen temperature within target range for patient.. Well-nourished and well-hydrated in no acute distress. Eyes conjunctiva clear no eyelid edema noted. pupils equal round and reactive to light and accommodation. Ears, Nose, Mouth, and Throat no gross abnormality of ear auricles or external auditory canals. normal hearing noted during conversation. mucus membranes moist. Respiratory normal breathing without difficulty. clear to auscultation bilaterally. Cardiovascular regular rate and rhythm with normal S1, S2. 2+ dorsalis pedis/posterior tibialis pulses. no clubbing, cyanosis, significant edema, <3 sec cap refill. Gastrointestinal (GI) soft, non-tender, non-distended, +BS. no ventral hernia noted. Musculoskeletal normal gait and posture. no significant deformity or arthritic changes, no loss or range of motion, no clubbing. Psychiatric this patient is able to make  decisions and demonstrates good insight into disease process. Alert and Oriented x 3. pleasant and cooperative. Notes Upon inspection patient's wound bed actually showed signs of fairly good granulation in the base of the wound in regard to the right first toe. He does have evidence of some undermining and even slight epiboly noted at this time which I think is good to require some sharp debridement to clear this away and allow the area to hopefully heal more appropriately. With that being said I was able to perform debridement at this site without complication today and he tolerated that without any pain again he does have significant neuropathy. Subsequently in regard to the left first toe there really was no open wound noted at this time although I suspect there may be underlying  osteomyelitis nonetheless. In regard to his left second toe there was bone exposed in the distal portion of the wound which is right on the tip of the toe and I do believe that this is likely evidence of osteomyelitis. Especially with the quality of the bone. Utilizing rongeurs I was able to remove samples of the bone to both debride away as well as sent for pathology and culture so that we can determine the best method of treatment currently. The patient tolerated this again with no discomfort secondary to the neuropathy. Electronic Signature(s) Signed: 06/14/2019 5:15:08 PM By: Worthy Keeler PA-C Entered By: Worthy Keeler on 06/14/2019 17:15:07 [name] ([id number]) -------------------------------------------------------------------------------- Physician Orders Details Patient Name: [name] Date of Service: 06/14/2019 1:15 PM Medical Record Number: [id number] Patient Account Number: [id number] Date of Birth/Sex: 05-Nov-1965 (53 y.o. 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 Wound #2 Left Toe Second o Dial antibacterial soap, wash wounds, rinse and pat dry prior to dressing wounds o May Shower, gently pat wound dry prior to applying new dressing. Primary Wound Dressing Wound #1 Right Toe Great o Silver Alginate Wound #2 Left Toe Second o Silver Alginate Secondary Dressing Wound #1 Right Toe Great o Conform/Kerlix Wound #2 Left Toe Second o Conform/Kerlix Dressing Change Frequency Wound #1 Right Toe Great o Change dressing every day. Wound #2 Left Toe Second o Change dressing every day. Follow-up Appointments o Return Appointment in 1 week. Off-Loading [name], [name] ([id number]) Wound #1 Right Toe Great o Open toe surgical shoe to: - both feet Wound #2 Left Toe Second o Open toe surgical shoe to: - both feet Laboratory o Bacteria identified in Wound by Culture (MICRO) oooo LOINC Code: [id number] oooo Convenience Name: Wound culture routine o Tissue Pathology biopsy report (PATH) oooo LOINC Code: [phone] oooo Convenience Name: Tiss Path Bx report Radiology o MRI, lower extremity with contast Electronic Signature(s) Signed: 06/14/2019 3:57:57 PM By: Montey Hora Signed: 06/16/2019 2:05:35 AM By: Worthy Keeler PA-C Entered By: Montey Hora on 06/14/2019 14:27:29 [name]  ([id number]) -------------------------------------------------------------------------------- Problem List Details Patient Name: [name] Date of Service: 06/14/2019 1:15 PM Medical Record Number: [id number] Patient Account Number: [id number] Date of Birth/Sex: [birth year] (53 y.o. M) Treating RN: Montey Hora Primary Care Provider: Johny Drilling Other Clinician: Referring Provider: Johny Drilling Treating Provider/Extender: Melburn Hake, HOYT Weeks in Treatment: 0 Active Problems ICD-10 Evaluated Encounter Code Description Active Date Today Diagnosis E11.621 Type 2 diabetes mellitus with foot ulcer 06/14/2019 No Yes L97.512 Non-pressure chronic ulcer of other part of right foot with fat 06/14/2019 No Yes layer exposed L97.522 Non-pressure chronic ulcer of other part of left foot with fat 06/14/2019 No Yes layer exposed Z89.421 Acquired absence of other right toe(s) 06/14/2019 No Yes I10 Essential (primary) hypertension 06/14/2019 No Yes Inactive Problems Resolved Problems Electronic Signature(s) Signed: 06/14/2019 1:53:57 PM By: Melburn Hake,  Margarita Grizzle PA-C Entered By: Worthy Keeler on 06/14/2019 13:53:56 [name] ([id number]) -------------------------------------------------------------------------------- Progress Note Details Patient Name: [name], [name] Date of Service: 06/14/2019 1:15 PM Medical Record Number: [id number] Patient Account Number: [id number] Date of Birth/Sex: [birth year] (54 y.o. M) Treating RN: Montey Hora Primary Care Provider: Johny Drilling Other Clinician: Referring Provider: Johny Drilling Treating Provider/Extender: Melburn Hake, HOYT Weeks in Treatment: 0 Subjective Chief Complaint Information obtained from Patient Bilateral foot ulcers History of Present Illness (HPI) 06/14/2019 on evaluation today patient appears to be doing somewhat poorly in regard to his bilateral feet. This is his initial evaluation here in our clinic and unfortunately he has previously had  a right fifth toe amputation secondary to osteomyelitis and that has not been that long past. He could not give me an exact time. With that being said he currently has an issue with his right great toe on the dorsal surface where he has an open wound. On the distal end of his left second toe he has an open wound with bone exposed and the bone does appear to be necrotic in my opinion it does not appear to be as firm as should be. With that being said he also has an x-ray which was taken on the visit to the ER this past Sunday, 06/12/2019 on that x-ray it appears that he has a comminuted fracture of the left great toe and a distal fracture of the left second toe. Again my suspicion is may be secondary to issues with osteomyelitis he has not had any injury to the area. 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 He does have a history of diabetes mellitus type 2, peripheral neuropathy, again a right fifth toe amputation, and hypertension. Patient History Information obtained from Patient. Allergies No Known Allergies Family History Diabetes - Father, Hypertension - Father, Stroke - Siblings, No family history of Cancer, Heart Disease, Kidney Disease, Lung Disease, Seizures, Thyroid Problems, Tuberculosis. Social History Current every day smoker, Marital Status - Single, Alcohol Use - Daily, Drug Use - No History, Caffeine Use - Moderate. Medical History Eyes Patient has history of Cataracts Denies history of Glaucoma, Optic Neuritis Ear/Nose/Mouth/Throat Denies history of Chronic sinus problems/congestion, Middle ear problems Hematologic/Lymphatic Denies history of Anemia, Hemophilia, Human Immunodeficiency Virus, Lymphedema, Sickle Cell Disease Respiratory Denies history of Aspiration, Asthma, Chronic Obstructive Pulmonary Disease (COPD), Pneumothorax, Sleep Apnea, Tuberculosis Cardiovascular Patient has history of  Hypertension Denies history of Angina, Arrhythmia, Congestive Heart Failure, Coronary Artery Disease, Deep Vein Thrombosis, Hypotension, Myocardial Infarction, Peripheral Arterial Disease, Peripheral Venous Disease, Phlebitis, Vasculitis [name], [name] ([id number]) Gastrointestinal Denies history of Cirrhosis , Colitis, Crohn s, Hepatitis A, Hepatitis B, Hepatitis C Endocrine Patient has history of Type II Diabetes Genitourinary Denies history of End Stage Renal Disease Immunological Denies history of Lupus Erythematosus, Raynaud s, Scleroderma Integumentary (Skin) Denies history of History of Burn, History of pressure wounds Musculoskeletal Denies history of Gout, Rheumatoid Arthritis, Osteoarthritis, Osteomyelitis Neurologic Denies history of Dementia, Neuropathy, Quadriplegia, Paraplegia, Seizure Disorder Oncologic Denies history of Received Chemotherapy, Received Radiation Psychiatric Denies history of Anorexia/bulimia, Confinement Anxiety Patient is treated with Oral Agents. Hospitalization/Surgery History - [facility] for amputation. Review of Systems (ROS) Constitutional Symptoms (General Health) Denies complaints or symptoms of Fatigue, Fever, Chills, Marked Weight Change. Eyes Complains or has symptoms of Glasses / Contacts - contacts. Ear/Nose/Mouth/Throat Denies complaints or symptoms of Difficult clearing ears, Sinusitis. Hematologic/Lymphatic Denies complaints or symptoms of Bleeding / Clotting Disorders, Human  Immunodeficiency Virus. Respiratory Denies complaints or symptoms of Chronic or frequent coughs, Shortness of Breath. Cardiovascular Denies complaints or symptoms of Chest pain, LE edema. Gastrointestinal Denies complaints or symptoms of Frequent diarrhea, Nausea, Vomiting. Endocrine Denies complaints or symptoms of Hepatitis, Thyroid disease, Polydypsia (Excessive Thirst). Genitourinary Denies complaints or symptoms of Kidney failure/ Dialysis,  Incontinence/dribbling. Immunological Denies complaints or symptoms of Hives, Itching. Integumentary (Skin) Complains or has symptoms of Wounds. Denies complaints or symptoms of Bleeding or bruising tendency, Breakdown, Swelling. Musculoskeletal Denies complaints or symptoms of Muscle Pain, Muscle Weakness. Neurologic Denies complaints or symptoms of Numbness/parasthesias, Focal/Weakness. Psychiatric Denies complaints or symptoms of Anxiety, Claustrophobia. [name], [name] ([id number]) Objective Constitutional patient is hypertensive.. pulse regular and within target range for patient.Marland Kitchen respirations regular, non-labored and within target range for patient.Marland Kitchen temperature within target range for patient.. Well-nourished and well-hydrated in no acute distress. Vitals Time Taken: 1:30 PM, Height: 75 in, Source: Stated, Weight: 240 lbs, Source: Stated, BMI: 30, Temperature: 99.4 F, Pulse: 90 bpm, Respiratory Rate: 16 breaths/min, Blood Pressure: 182/97 mmHg. Eyes conjunctiva clear no eyelid edema noted. pupils equal round and reactive to light and accommodation. Ears, Nose, Mouth, and Throat no gross abnormality of ear auricles or external auditory canals. normal hearing noted during conversation. mucus membranes moist. Respiratory normal breathing without difficulty. clear to auscultation bilaterally. Cardiovascular regular rate and rhythm with normal S1, S2. 2+ dorsalis pedis/posterior tibialis pulses. no clubbing, cyanosis, significant edema, Gastrointestinal (GI) soft, non-tender, non-distended, +BS. no ventral hernia noted. Musculoskeletal normal gait and posture. no significant deformity or arthritic changes, no loss or range of motion, no clubbing. Psychiatric this patient is able to make decisions and demonstrates good insight into disease process. Alert and Oriented x 3. pleasant and cooperative. General Notes: Upon inspection patient's wound bed actually showed signs of fairly  good granulation in the base of the wound in regard to the right first toe. He does have evidence of some undermining and even slight epiboly noted at this time which I think is good to require some sharp debridement to clear this away and allow the area to hopefully heal more appropriately. With that being said I was able to perform debridement at this site without complication today and he tolerated that without any pain again he does have significant neuropathy. Subsequently in regard to the left first toe there really was no open wound noted at this time although I suspect there may be underlying osteomyelitis nonetheless. In regard to his left second toe there was bone exposed in the distal portion of the wound which is right on the tip of the toe and I do believe that this is likely evidence of osteomyelitis. Especially with the quality of the bone. Utilizing rongeurs I was able to remove samples of the bone to both debride away as well as sent for pathology and culture so that we can determine the best method of treatment currently. The patient tolerated this again with no discomfort secondary to the neuropathy. Integumentary (Hair, Skin) Wound #1 status is Open. Original cause of wound was Gradually Appeared. The wound is located on the Right Toe Great. The wound measures 0.4cm length x 0.3cm width x 0.4cm depth; 0.094cm^2 area and 0.038cm^3 volume. There is Fat Layer (Subcutaneous Tissue) Exposed exposed. There is no tunneling or undermining noted. There is a medium amount of serosanguineous drainage noted. There is small (1-33%) red granulation within the wound bed. There is a large (67-100%) amount of necrotic tissue within the wound bed including  Eschar and Adherent Slough. Wound #2 status is Open. Original cause of wound was Gradually Appeared. The wound is located on the Left Toe Second. The wound measures 1.6cm length x 2.1cm width x 0.8cm depth; 2.639cm^2 area and 2.111cm^3 volume.  There is bone and Fat Layer (Subcutaneous Tissue) Exposed exposed. There is no tunneling or undermining noted. There is a medium amount of serosanguineous drainage noted. There is small (1-33%) red granulation within the wound bed. There is a large (67-100%) Russell, Frederick ([id number]) amount of necrotic tissue within the wound bed including Eschar and Adherent Slough. 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 There was a Excisional Skin/Subcutaneous Tissue Debridement with a total area of 0.12 sq cm performed by STONE III, HOYT E., PA-C. With the following instrument(s): Curette to remove Viable and Non-Viable tissue/material. Material removed includes Subcutaneous Tissue and Slough and after achieving pain control using Lidocaine 4% Topical Solution. No specimens were taken. A time out was conducted at 14:11, prior to the start of the procedure. A Minimum amount of bleeding was controlled with Pressure. The procedure was tolerated well with a pain level of 0 throughout and a pain level of 0 following the procedure. Post Debridement Measurements: 0.4cm length x 0.3cm width x 0.5cm depth; 0.047cm^3 volume. Character of Wound/Ulcer Post Debridement is improved. Severity of Tissue Post Debridement is: Fat layer exposed. Post procedure Diagnosis Wound #1: Same as Pre-Procedure Wound #2 Pre-procedure diagnosis of Wound #2 is a Diabetic Wound/Ulcer of the Lower Extremity located on the Left Toe Second .Severity of Tissue Pre Debridement is: Necrosis  of bone. There was a Excisional Skin/Subcutaneous Tissue/Muscle/Bone Debridement with a total area of 3.36 sq cm performed by STONE III, HOYT E., PA-C. With the following instrument(s): Rongeur to remove Viable and Non-Viable tissue/material. Material removed includes Bone,Subcutaneous Tissue, and Slough after achieving pain control using Lidocaine 4% Topical Solution. 2 specimens were taken by a Tissue Culture and sent to the lab per facility protocol. A time out was conducted at 14:20, prior to the start of the procedure. A Minimum amount of bleeding was controlled with Pressure. The procedure was tolerated well with a pain level of 0 throughout and a pain level of 0 following the procedure. Post Debridement Measurements: 1.6cm length x 2.1cm width x 1cm depth; 2.639cm^3 volume. Character of Wound/Ulcer Post Debridement is improved. Severity of Tissue Post Debridement is: Necrosis of bone. Post procedure Diagnosis Wound #2: Same as Pre-Procedure Plan Wound Cleansing: Wound #1 Right Toe GreatRHYLIN, NOSKA ([id number]) Dial antibacterial soap, wash wounds, rinse and pat dry prior to dressing wounds May Shower, gently pat wound dry prior to applying new dressing. Wound #2 Left Toe Second: Dial antibacterial soap, wash wounds, rinse and pat dry prior to dressing wounds May Shower, gently pat wound dry prior to applying new dressing. Primary Wound Dressing: Wound #1 Right Toe Great: Silver Alginate Wound #2 Left Toe Second: Silver Alginate Secondary Dressing: Wound #1 Right Toe Great: Conform/Kerlix Wound #2 Left Toe Second: Conform/Kerlix Dressing Change Frequency: Wound #1 Right Toe Great: Change dressing every day. Wound #2 Left Toe Second:  Change dressing every day. Follow-up Appointments: Return Appointment in 1 week. Off-Loading: Wound #1 Right Toe Great: Open toe surgical shoe to: - both feet Wound #2 Left Toe Second: Open toe surgical shoe to: - both feet Laboratory  ordered were: Wound culture routine, Tiss Path Bx report Radiology ordered were: MRI, lower extremity with contast 1. I would recommend currently that we go ahead and initiate treatment for both wounds with a silver alginate dressing at this time. Patient is in agreement with that plan. We will secure this with gauze. 2. I am also going to suggest currently that we go ahead and see about getting the patient set up for an MRI of the left foot to evaluate for the possibility of osteomyelitis. I think that that is a very strong possibility. 3. I also sent a bone culture as well as pathology to the lab for further evaluation in order to confirm diagnosis. I cleaned away as much of the necrotic bone on the distal portion of the second toe as was possible during the evaluation today. 4. I suggested the patient be very cautious in walking I recommended bilateral postop shoes in order to offload the areas and prevent from having to squeeze his foot into a standard shoe. He is in agreement with this plan. We will see patient back for reevaluation in 1 week here in the clinic. If anything worsens or changes patient will contact our office for additional recommendations. Electronic Signature(s) Signed: 06/14/2019 5:43:19 PM By: Worthy Keeler PA-C Entered By: Worthy Keeler on 06/14/2019 17:43:19 [name] ([id number]) -------------------------------------------------------------------------------- ROS/PFSH Details Patient Name: [name] Date of Service: 06/14/2019 1:15 PM Medical Record Number: [id number] Patient Account Number: [id number] Date of Birth/Sex: 01/03/1966 (53 y.o. M) Treating RN: Army Melia Primary Care Provider: Johny Drilling Other Clinician: Referring Provider: Johny Drilling Treating Provider/Extender: Melburn Hake, HOYT Weeks in Treatment: 0 Information Obtained From Patient Constitutional Symptoms (General Health) Complaints and Symptoms: Negative for: Fatigue; Fever;  Chills; Marked Weight Change Eyes Complaints and Symptoms: Positive for: Glasses / Contacts - contacts Medical History: Positive for: Cataracts Negative for: Glaucoma; Optic Neuritis Ear/Nose/Mouth/Throat Complaints and Symptoms: Negative for: Difficult clearing ears; Sinusitis Medical History: Negative for: Chronic sinus problems/congestion; Middle ear problems Hematologic/Lymphatic Complaints and Symptoms: Negative for: Bleeding / Clotting Disorders; Human Immunodeficiency Virus Medical History: Negative for: Anemia; Hemophilia; Human Immunodeficiency Virus; Lymphedema; Sickle Cell Disease Respiratory Complaints and Symptoms: Negative for: Chronic or frequent coughs; Shortness of Breath Medical History: Negative for: Aspiration; Asthma; Chronic Obstructive Pulmonary Disease (COPD); Pneumothorax; Sleep Apnea; Tuberculosis Cardiovascular Complaints and Symptoms: Negative for: Chest pain; LE edema Medical History: Positive for: Hypertension Negative for: Angina; Arrhythmia; Congestive Heart Failure; Coronary Artery Disease; Deep Vein Thrombosis; Hypotension; [name], MCLAWHORN ([id number]) Myocardial Infarction; Peripheral Arterial Disease; Peripheral Venous Disease; Phlebitis; Vasculitis Gastrointestinal Complaints and Symptoms: Negative for: Frequent diarrhea; Nausea; Vomiting Medical History: Negative for: Cirrhosis ; Colitis; Crohnos; Hepatitis A; Hepatitis B; Hepatitis C Endocrine Complaints and Symptoms: Negative for: Hepatitis; Thyroid disease; Polydypsia (Excessive Thirst) Medical History: Positive for: Type II Diabetes Treated with: Oral agents Genitourinary Complaints and Symptoms: Negative for: Kidney failure/ Dialysis; Incontinence/dribbling Medical History: Negative for: End Stage Renal Disease Immunological Complaints and Symptoms: Negative for: Hives; Itching Medical History: Negative for: Lupus Erythematosus; Raynaudos; Scleroderma Integumentary  (Skin) Complaints and Symptoms: Positive for: Wounds Negative for: Bleeding or bruising tendency; Breakdown; Swelling Medical History: Negative for: History of Burn; History of pressure wounds Musculoskeletal Complaints and Symptoms: Negative for: Muscle Pain; Muscle Weakness Medical  History: Negative for: Gout; Rheumatoid Arthritis; Osteoarthritis; Osteomyelitis Neurologic Complaints and Symptoms: Negative for: Numbness/parasthesias; Focal/Weakness Medical History: [name], GARCIALOPEZ ([id number]) Negative for: Dementia; Neuropathy; Quadriplegia; Paraplegia; Seizure Disorder Psychiatric Complaints and Symptoms: Negative for: Anxiety; Claustrophobia Medical History: Negative for: Anorexia/bulimia; Confinement Anxiety Oncologic Medical History: Negative for: Received Chemotherapy; Received Radiation HBO Extended History Items Eyes: Cataracts Immunizations Pneumococcal Vaccine: Received Pneumococcal Vaccination: No Implantable Devices None Hospitalization / Surgery History Type of Hospitalization/Surgery [facility] for amputation Family and Social History Cancer: No; Diabetes: Yes - Father; Heart Disease: No; Hypertension: Yes - Father; Kidney Disease: No; Lung Disease: No; Seizures: No; Stroke: Yes - Siblings; Thyroid Problems: No; Tuberculosis: No; Current every day smoker; Marital Status - Single; Alcohol Use: Daily; Drug Use: No History; Caffeine Use: Moderate; Financial Concerns: No; Food, Clothing or Shelter Needs: No; Support System Lacking: No; Transportation Concerns: No Electronic Signature(s) Signed: 06/14/2019 2:46:54 PM By: Army Melia Signed: 06/16/2019 2:05:35 AM By: Worthy Keeler PA-C Entered By: Army Melia on 06/14/2019 13:34:40 [name] ([id number]) -------------------------------------------------------------------------------- SuperBill Details Patient Name: [name] Date of Service: 06/14/2019 Medical Record Number: [id number] Patient Account Number:  [id number] Date of Birth/Sex: 1965-11-22 (53 y.o. 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 other right toe(s) I10 Essential (primary) hypertension Facility Procedures CPT4 Code: [id number] Description: [place] VISIT-LEV 3 EST PT Modifier: Quantity: 1 CPT4 Code: [id number] Description: [place] - DEB SUBQ TISSUE 20 SQ CM/< ICD-10 Diagnosis Description L97.512 Non-pressure chronic ulcer of other part of right foot with fat Modifier: layer exposed Quantity: 1 CPT4 Code: [id number] Description: [place] - DEB BONE 20 SQ CM/< ICD-10 Diagnosis Description L97.522 Non-pressure chronic ulcer of other part of left foot with fat Modifier: layer exposed Quantity: 1 Physician Procedures CPT4: Description Modifier Quantity Code [id number] - WC PHYS LEVEL 4 - NEW PT 25 1 ICD-10 Diagnosis Description E11.621 Type 2 diabetes mellitus with foot ulcer L97.512 Non-pressure chronic ulcer of other part of right foot with fat layer exposed  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

## 2019-06-18 LAB — AEROBIC CULTURE W GRAM STAIN (SUPERFICIAL SPECIMEN)

## 2019-06-21 ENCOUNTER — Encounter: Payer: BC Managed Care – PPO | Admitting: Physician Assistant

## 2019-06-21 ENCOUNTER — Other Ambulatory Visit: Payer: Self-pay

## 2019-06-21 DIAGNOSIS — E11621 Type 2 diabetes mellitus with foot ulcer: Secondary | ICD-10-CM | POA: Diagnosis not present

## 2019-06-21 NOTE — Progress Notes (Addendum)
[name], [name] ([id number]) Visit Report for 06/21/2019 Chief Complaint Document Details Patient Name: [name], [name] Date of Service: 06/21/2019 2:00 PM Medical Record Number: [id number] Patient Account Number: [id number] Date of Birth/Sex: 1966-04-25 (53 y.o. M) Treating RN: Montey Hora Primary Care Provider: Johny Drilling Other Clinician: Referring Provider: Johny Drilling Treating Provider/Extender: Melburn Hake, HOYT Weeks in Treatment: 1 Information Obtained from: Patient Chief Complaint Bilateral foot ulcers Electronic Signature(s) Signed: 06/21/2019 2:26:00 PM By: Worthy Keeler PA-C Entered By: Worthy Keeler on 06/21/2019 14:26:00 [name] ([id number]) -------------------------------------------------------------------------------- Debridement Details Patient Name: [name] Date of Service: 06/21/2019 2:00 PM Medical Record Number: [id number] Patient Account Number: [id number] Date of Birth/Sex: 21-Jul-1966 (53 y.o. M) Treating RN: Montey Hora Primary Care Provider: Johny Drilling Other Clinician: Referring Provider: Johny Drilling Treating Provider/Extender: Melburn Hake, HOYT Weeks in Treatment: 1 Debridement Performed for Wound #2 Left Toe Second Assessment: Performed By: Physician STONE III, HOYT E., PA-C Debridement Type: Debridement Severity of Tissue Pre Necrosis of bone Debridement: Level of Consciousness (Pre- Awake and Alert procedure): Pre-procedure Verification/Time Yes - 14:42 Out Taken: Start Time: 14:42 Pain Control: Lidocaine 4% Topical Solution Total Area Debrided (L x W): 1.2 (cm) x 2.1 (cm) = 2.52 (cm) Tissue and other material Viable, Non-Viable, Bone, Callus, Slough, Subcutaneous, Slough debrided: Level: Skin/Subcutaneous Tissue/Muscle/Bone Debridement Description: Excisional Instrument: Curette Bleeding: Minimum Hemostasis Achieved: Pressure End Time: 14:50 Procedural Pain: 0 Post Procedural Pain: 0 Response to Treatment: Procedure  was tolerated well Level of Consciousness Awake and Alert (Post-procedure): Post Debridement Measurements of Total Wound Length: (cm) 1.2 Width: (cm) 2.1 Depth: (cm) 1.4 Volume: (cm) 2.771 Character of Wound/Ulcer Post Debridement: Improved Severity of Tissue Post Debridement: Necrosis of bone Post Procedure Diagnosis Same as Pre-procedure Electronic Signature(s) Signed: 06/21/2019 4:20:48 PM By: Montey Hora Signed: 06/21/2019 6:05:24 PM By: Worthy Keeler PA-C Entered By: Montey Hora on 06/21/2019 14:48:15 [name] ([id number]) -------------------------------------------------------------------------------- HPI Details Patient Name: [name] Date of Service: 06/21/2019 2:00 PM Medical Record Number: [id number] Patient Account Number: [id number] Date of Birth/Sex: 07-Dec-1965 (53 y.o. M) Treating RN: Montey Hora Primary Care Provider: Johny Drilling Other Clinician: Referring Provider: Johny Drilling Treating Provider/Extender: Melburn Hake, HOYT Weeks in Treatment: 1 History of Present Illness HPI Description: 06/14/2019 on evaluation today patient appears to be doing somewhat poorly in regard to his bilateral feet. 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 He could not give me an exact time. With that being said he currently has an issue with his right great toe on the dorsal surface where he has an open wound. On the distal end of his left second toe he has an open wound with bone exposed and the bone does appear to be necrotic in my opinion it does not appear to be as firm as should be. With that being said he also has an x-ray which was taken on the visit to the ER this past Sunday, 06/12/2019 on that x-ray it appears that he has a comminuted fracture of the left great toe and a distal fracture of the left second toe. Again my  suspicion is may be secondary to issues with osteomyelitis he has not had any injury to the area. 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 He does have a history of diabetes mellitus type 2, peripheral neuropathy, again  a right fifth toe amputation, and hypertension. 06/21/2019 on evaluation today patient appears to be doing somewhat better with regard to his toe ulcer compared to last evaluation. He did grow 2 organisms that were cultured out which was revealed on the culture I reviewed today. Enterobacter was 1 of those and then the second with Pseudomonas. The good news is Cipro will help for both he has been taking Bactrim as well as Augmentin currently I do not think he needs to continue taking both of these I think we can switch to Cipro and cover pretty much everything that we need to this is good news. Fortunately there is no signs of systemic infection and overall I do feel like his toe ulcer is better he still has some necrotic bone on the surface that is noted as well as some hyper granular tissue and again necrotic tissue on the distal portion of the toe but again I think that cleaning this away we can hopefully get this area to heal up and not end up with another amputation that is what he is wanting to try to avoid at this time. Electronic Signature(s) Signed: 06/21/2019 5:14:38 PM By: Worthy Keeler PA-C Entered By: Worthy Keeler on 06/21/2019 17:14:38 [name] ([id number]) -------------------------------------------------------------------------------- Physical Exam Details Patient Name: [name] Date of Service: 06/21/2019 2:00 PM Medical Record Number: [id number] Patient Account Number: [id number] Date of Birth/Sex: 05-01-1966 (53 y.o. M) Treating RN: Montey Hora Primary Care Provider: Johny Drilling Other Clinician: Referring Provider: Johny Drilling Treating Provider/Extender: Melburn Hake, HOYT Weeks  in Treatment: 1 Constitutional Well-nourished and well-hydrated in no acute distress. Respiratory normal breathing without difficulty. Psychiatric this patient is able to make decisions and demonstrates good insight into disease process. Alert and Oriented x 3. pleasant and cooperative. Notes Patient's wound bed currently showed signs of good granulation in some portions of the wound although he still has a lot of necrotic tissue/slough noted on the surface of a lot of that granulation. Bone is still exposed in the base of the toe ulcer and again I did perform sharp debridement to clear away some of the bone that was necrotic on the surface down to what seem to be healthy bone in my opinion that was minimal compared to last week's evaluation and what we had to do. With that being said I am overall pleased with the fact that the patient is well seems to not have worsened with regard to infection. Electronic Signature(s) Signed: 06/21/2019 5:15:19 PM By: Worthy Keeler PA-C Entered By: Worthy Keeler on 06/21/2019 17:15:19 [name] ([id number]) -------------------------------------------------------------------------------- Physician Orders Details Patient Name: [name] Date of Service: 06/21/2019 2:00 PM Medical Record Number: [id number] Patient Account Number: [id number] Date of Birth/Sex: [birth year] (53 y.o. M) Treating RN: Montey Hora Primary Care Provider: Johny Drilling Other Clinician: Referring Provider: Johny Drilling Treating Provider/Extender: Melburn Hake, HOYT Weeks in Treatment: 1 Verbal / Phone Orders: No Diagnosis Coding ICD-10 Coding Code Description E11.621 Type 2 diabetes mellitus with foot ulcer L97.512 Non-pressure chronic ulcer of other part of right foot with fat layer exposed M86.672 Other chronic osteomyelitis, left ankle and foot L97.522 Non-pressure chronic ulcer of other part of left foot with fat layer exposed Z89.421 Acquired absence of other right  toe(s) I10 Essential (primary) hypertension Wound Cleansing Wound #1 Right Toe Great o Dial antibacterial soap, wash wounds, rinse and pat dry prior to dressing wounds o May Shower, gently pat wound dry prior to applying new dressing. Wound #2 Left  Toe Second o Dial antibacterial soap, wash wounds, rinse and pat dry prior to dressing wounds o May Shower, gently pat wound dry prior to applying new dressing. Primary Wound Dressing Wound #1 Right Toe Great o Foam Wound #2 Left Toe Second o Silver Alginate Secondary Dressing Wound #1 Right Toe Great o Other - bandaid or coverlet Wound #2 Left Toe Second o Conform/Kerlix Dressing Change Frequency Wound #1 Right Toe Great o Change dressing every day. Wound #2 Left Toe Second o Change dressing every day. Follow-up Appointments o Return Appointment in 1 week. [name], [name] ([id number]) Off-Loading Wound #1 Right Toe Great o Open toe surgical shoe to: - both feet Wound #2 Left Toe Second o Open toe surgical shoe to: - both feet Hyperbaric Oxygen Therapy Wound #2 Left Toe Second o Evaluate for HBO Therapy o Indication: - chronic refractory osteomyelitis o If appropriate for treatment, begin HBOT per protocol: o 2.0 ATA for 90 Minutes without Air Breaks o One treatment per day (delivered Monday through Friday unless otherwise specified in Special Instructions below): o Total # of Treatments: - 40 o Finger stick Blood Glucose Pre- and Post- HBOT Treatment. o Follow Hyperbaric Oxygen Glycemia Protocol Consults o Infectious Disease Radiology o X-ray, Chest Custom Services o ECG Patient Medications Allergies: No Known Allergies Notifications Medication Indication Start End Cipro 06/21/2019 DOSE 1 - oral 500 mg tablet - 1 tablet oral taken 2 times a day for 14 days. Please take atorvastatin every other day while on this medication and decrease oxycodone use GLYCEMIA INTERVENTIONS  PROTOCOL PRE-HBO GLYCEMIA INTERVENTIONS ACTION INTERVENTION Obtain pre-HBO capillary blood glucose 1 (ensure physician order is in chart). A. Notify HBO physician and await physician orders. 2 If result is 70 mg/dl or below: B. If the result meets the hospital definition of a critical result, follow hospital policy. If result is 71 mg/dl to 130 mg/dl: A. Give patient an 8 ounce Glucerna Shake, an 8 ounce Ensure, or 8 ounces of a Glucerna/Ensure equivalent dietary supplement*. B. Wait 30 minutes. [name], [name] ([id number]) C. Retest patientos capillary blood glucose (CBG). D. If result greater than or equal to 110 mg/dl, proceed with HBO. If result less than 110 mg/dl, notify HBO physician and consider holding HBO. If result is 131 mg/dl to 249 mg/dl: A. Proceed with HBO. A. Notify HBO physician and await physician orders. B. It is recommended to hold HBO and If result is 250 mg/dl or greater: do blood/urine ketone testing. C. If the result meets the hospital definition of a critical result, follow hospital policy. POST-HBO GLYCEMIA INTERVENTIONS ACTION INTERVENTION Obtain post HBO capillary blood glucose 1 (ensure physician order is in chart). A. Notify HBO physician and await physician orders. 2 If result is 70 mg/dl or below: B. If the result meets the hospital definition of a critical result, follow hospital policy. A. Give patient an 8 ounce Glucerna Shake, an 8 ounce Ensure, or 8 ounces of a Glucerna/Ensure equivalent dietary supplement*. B. Wait 15 minutes for symptoms of hypoglycemia (i.e. nervousness, anxiety, sweating, chills, If result is 71 mg/dl to 100 mg/dl: clamminess, irritability, confusion, tachycardia or dizziness). C. If patient asymptomatic, discharge patient. If patient symptomatic, repeat capillary blood glucose (CBG) and notify HBO physician. If result is 101 mg/dl to 249 mg/dl: A. Discharge patient. A. Notify HBO physician and  await physician orders. B. It is recommended to do blood/urine If result is 250 mg/dl or greater: ketone testing. C. If the result meets the hospital definition of a critical  result, follow hospital policy. *Juice or candies are NOT equivalent products. If patient refuses the Glucerna or Ensure, please consult the hospital dietitian for an appropriate substitute. Electronic Signature(s) Signed: 06/23/2019 4:37:34 PM By: Montey Hora Signed: 06/23/2019 4:47:50 PM By: Worthy Keeler PA-C Previous Signature: 06/21/2019 3:20:22 PM Version By: Worthy Keeler PA-C Entered By: Montey Hora on 06/23/2019 13:03:14 [name] ([id number]) -------------------------------------------------------------------------------- Problem List Details Patient Name: [name] Date of Service: 06/21/2019 2:00 PM Medical Record Number: [id number] Patient Account Number: [id number] Date of Birth/Sex: Jun 01, 1966 (53 y.o. M) Treating RN: Montey Hora Primary Care Provider: Johny Drilling Other Clinician: Referring Provider: Johny Drilling Treating Provider/Extender: Melburn Hake, HOYT Weeks in Treatment: 1 Active Problems ICD-10 Evaluated Encounter Code Description Active Date Today Diagnosis E11.621 Type 2 diabetes mellitus with foot ulcer 06/14/2019 No Yes L97.512 Non-pressure chronic ulcer of other part of right foot with fat 06/14/2019 No Yes layer exposed M86.672 Other chronic osteomyelitis, left ankle and foot 06/21/2019 No Yes L97.522 Non-pressure chronic ulcer of other part of left foot with fat 06/14/2019 No Yes layer exposed Z89.421 Acquired absence of other right toe(s) 06/14/2019 No Yes I10 Essential (primary) hypertension 06/14/2019 No Yes Inactive Problems Resolved Problems Electronic Signature(s) Signed: 06/21/2019 3:02:39 PM By: Worthy Keeler PA-C Previous Signature: 06/21/2019 2:25:51 PM Version By: Worthy Keeler PA-C Entered By: Worthy Keeler on 06/21/2019 15:02:38 [name]  ([id number]) -------------------------------------------------------------------------------- Progress Note Details Patient Name: [name] Date of Service: 06/21/2019 2:00 PM Medical Record Number: [id number] Patient Account Number: [id number] Date of Birth/Sex: 09/09/1966 (52 y.o. M) Treating RN: Montey Hora Primary Care Provider: Johny Drilling Other Clinician: Referring Provider: Johny Drilling Treating Provider/Extender: Melburn Hake, HOYT Weeks in Treatment: 1 Subjective Chief Complaint Information obtained from Patient Bilateral foot ulcers History of Present Illness (HPI) 06/14/2019 on evaluation today patient appears to be doing somewhat poorly in regard to his bilateral feet. 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 He could not give me an exact time. With that being said he currently has an issue with his right great toe on the dorsal surface where he has an open wound. On the distal end of his left second toe he has an open wound with bone exposed and the bone does appear to be necrotic in my opinion it does not appear to be as firm as should be. With that being said he also has an x-ray which was taken on the visit to the ER this past Sunday, 06/12/2019 on that x-ray it appears that he has a comminuted fracture of the left great toe and a distal fracture of the left second toe. Again my suspicion is may be secondary to issues with osteomyelitis he has not had any injury to the area. 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 He does have a history of diabetes mellitus type 2, peripheral neuropathy, again a right fifth toe amputation, and hypertension. 06/21/2019 on evaluation today patient appears to be doing somewhat better with regard to his toe ulcer compared to  last evaluation. He did grow 2 organisms that were cultured out which was revealed on the culture I reviewed today. Enterobacter was 1 of those and then the second with Pseudomonas. The good news is Cipro will help for both he has been taking Bactrim as well as  Augmentin currently I do not think he needs to continue taking both of these I think we can switch to Cipro and cover pretty much everything that we need to this is good news. Fortunately there is no signs of systemic infection and overall I do feel like his toe ulcer is better he still has some necrotic bone on the surface that is noted as well as some hyper granular tissue and again necrotic tissue on the distal portion of the toe but again I think that cleaning this away we can hopefully get this area to heal up and not end up with another amputation that is what he is wanting to try to avoid at this time. Patient History Information obtained from Patient. Family History Diabetes - Father, Hypertension - Father, Stroke - Siblings, No family history of Cancer, Heart Disease, Kidney Disease, Lung Disease, Seizures, Thyroid Problems, Tuberculosis. Social History Current every day smoker, Marital Status - Single, Alcohol Use - Daily, Drug Use - No History, Caffeine Use - Moderate. Medical History Eyes Patient has history of Cataracts Denies history of Glaucoma, Optic Neuritis Ear/Nose/Mouth/Throat Denies history of Chronic sinus problems/congestion, Middle ear problems Hematologic/Lymphatic Denies history of Anemia, Hemophilia, Human Immunodeficiency Virus, Lymphedema, Sickle Cell Disease Respiratory Denies history of Aspiration, Asthma, Chronic Obstructive Pulmonary Disease (COPD), Pneumothorax, Sleep Apnea, [name], [name] ([id number]) Tuberculosis Cardiovascular Patient has history of Hypertension Denies history of Angina, Arrhythmia, Congestive Heart Failure, Coronary Artery Disease, Deep Vein Thrombosis, Hypotension, Myocardial  Infarction, Peripheral Arterial Disease, Peripheral Venous Disease, Phlebitis, Vasculitis Gastrointestinal Denies history of Cirrhosis , Colitis, Crohn s, Hepatitis A, Hepatitis B, Hepatitis C Endocrine Patient has history of Type II Diabetes Genitourinary Denies history of End Stage Renal Disease Immunological Denies history of Lupus Erythematosus, Raynaud s, Scleroderma Integumentary (Skin) Denies history of History of Burn, History of pressure wounds Musculoskeletal Denies history of Gout, Rheumatoid Arthritis, Osteoarthritis, Osteomyelitis Neurologic Denies history of Dementia, Neuropathy, Quadriplegia, Paraplegia, Seizure Disorder Oncologic Denies history of Received Chemotherapy, Received Radiation Psychiatric Denies history of Anorexia/bulimia, Confinement Anxiety Hospitalization/Surgery History - [facility] for amputation. Review of Systems (ROS) Constitutional Symptoms (General Health) Denies complaints or symptoms of Fatigue, Fever, Chills, Marked Weight Change. Respiratory Denies complaints or symptoms of Chronic or frequent coughs, Shortness of Breath. Cardiovascular Denies complaints or symptoms of Chest pain, LE edema. Psychiatric Denies complaints or symptoms of Anxiety, Claustrophobia. Objective Constitutional Well-nourished and well-hydrated in no acute distress. Vitals Time Taken: 2:18 PM, Height: 75 in, Weight: 240 lbs, BMI: 30, Temperature: 98.8 F, Pulse: 88 bpm, Respiratory Rate: 16 breaths/min, Blood Pressure: 154/87 mmHg. Respiratory normal breathing without difficulty. Psychiatric this patient is able to make decisions and demonstrates good insight into disease process. Alert and Oriented x 3. pleasant and cooperative. [name], [name] ([id number]) General Notes: Patient's wound bed currently showed signs of good granulation in some portions of the wound although he still has a lot of necrotic tissue/slough noted on the surface of a lot of that granulation.  Bone is still exposed in the base of the toe ulcer and again I did perform sharp debridement to clear away some of the bone that was necrotic on the surface down to what seem to be healthy bone in my opinion that was minimal compared to last week's evaluation and what we had to do. With that being said I am overall pleased with the fact that the patient is well seems to not have worsened with regard to infection. Integumentary (Hair, Skin) Wound #1 status is Open. Original cause of  wound was Gradually Appeared. The wound is located on the Right Toe Great. The wound measures 0.1cm length x 0.1cm width x 0.1cm depth; 0.008cm^2 area and 0.001cm^3 volume. There is no tunneling or undermining noted. There is a none present amount of drainage noted. There is no granulation within the wound bed. There is a large (67-100%) amount of necrotic tissue within the wound bed including Eschar and Adherent Slough. Wound #2 status is Open. Original cause of wound was Gradually Appeared. The wound is located on the Left Toe Second. The wound measures 1.2cm length x 2.1cm width x 1.2cm depth; 1.979cm^2 area and 2.375cm^3 volume. There is bone and Fat Layer (Subcutaneous Tissue) Exposed exposed. There is no tunneling or undermining noted. There is a medium amount of serosanguineous drainage noted. The wound margin is distinct with the outline attached to the wound base. There is medium (34-66%) red granulation within the wound bed. There is a medium (34-66%) amount of necrotic tissue within the wound bed including Adherent Slough and Necrosis of Bone. Assessment Active Problems ICD-10 Type 2 diabetes mellitus with foot ulcer Non-pressure chronic ulcer of other part of right foot with fat layer exposed Other chronic osteomyelitis, left ankle and foot Non-pressure chronic ulcer of other part of left foot with fat layer exposed Acquired absence of other right toe(s) Essential (primary)  hypertension Procedures Wound #2 Pre-procedure diagnosis of Wound #2 is a Diabetic Wound/Ulcer of the Lower Extremity located on the Left Toe Second .Severity of Tissue Pre Debridement is: Necrosis of bone. There was a Excisional Skin/Subcutaneous Tissue/Muscle/Bone Debridement with a total area of 2.52 sq cm performed by STONE III, HOYT E., PA-C. With the following instrument(s): Curette to remove Viable and Non-Viable tissue/material. Material removed includes Bone,Callus, Subcutaneous Tissue, and Slough after achieving pain control using Lidocaine 4% Topical Solution. No specimens were taken. A time out was conducted at 14:42, prior to the start of the procedure. A Minimum amount of bleeding was controlled with Pressure. The procedure was tolerated well with a pain level of 0 throughout and a pain level of 0 following the procedure. Post Debridement Measurements: 1.2cm length x 2.1cm width x 1.4cm depth; 2.771cm^3 volume. Character of Wound/Ulcer Post Debridement is improved. Severity of Tissue Post Debridement is: Necrosis of bone. Post procedure Diagnosis Wound #2: Same as Pre-Procedure [name], [name] ([id number]) Plan Wound Cleansing: Wound #1 Right Toe Great: Dial antibacterial soap, wash wounds, rinse and pat dry prior to dressing wounds May Shower, gently pat wound dry prior to applying new dressing. Wound #2 Left Toe Second: Dial antibacterial soap, wash wounds, rinse and pat dry prior to dressing wounds May Shower, gently pat wound dry prior to applying new dressing. Primary Wound Dressing: Wound #1 Right Toe Great: Foam Wound #2 Left Toe Second: Silver Alginate Secondary Dressing: Wound #1 Right Toe Great: Other - bandaid or coverlet Wound #2 Left Toe Second: Conform/Kerlix Dressing Change Frequency: Wound #1 Right Toe Great: Change dressing every day. Wound #2 Left Toe Second: Change dressing every day. Follow-up Appointments: Return Appointment in 1  week. Off-Loading: Wound #1 Right Toe Great: Open toe surgical shoe to: - both feet Wound #2 Left Toe Second: Open toe surgical shoe to: - both feet Hyperbaric Oxygen Therapy: Wound #2 Left Toe Second: Evaluate for HBO Therapy Indication: - chronic refractory osteomyelitis If appropriate for treatment, begin HBOT per protocol: 2.0 ATA for 90 Minutes without Air Breaks One treatment per day (delivered Monday through Friday unless otherwise specified in Special Instructions below): Total #  of Treatments: - 40 Finger stick Blood Glucose Pre- and Post- HBOT Treatment. Follow Hyperbaric Oxygen Glycemia Protocol Consults ordered were: Infectious Disease The following medication(s) was prescribed: Cipro oral 500 mg tablet 1 1 tablet oral taken 2 times a day for 14 days. Please take atorvastatin every other day while on this medication and decrease oxycodone use starting 06/21/2019 1. My suggestion at this time is good to be that we go ahead and initiate a continuation of the silver alginate dressing for the wound bed. He seems to be tolerating that well at this time. 2. I am also going to go ahead and proceed with the MRI that is already scheduled for sometime in the next week hopefully will get that sooner rather than later to ensure the extent of which the osteomyelitis may be occurring I am hoping this is the distal portion of the toe and not further into the foot. 3. I am also get a go ahead and place the patient on Cipro today 500 mg twice a day and this was sent to the pharmacy for [name], [name] ([id number]) him he does need to in my opinion take the atorvastatin just every other day so as not to overdo this with the interaction with the Cipro and subsequently also needs to decrease the oxycodone use during the time that he is utilizing the Cipro. This is for safety reasons. 4. I do believe this patient is a good candidate for hyperbaric oxygen therapy based on the fact that he has  unfortunately significant issues with osteomyelitis of this toe and again were trying to do what we can to save the toe and prevent further amputation. That is the patient's goal as well. Therefore we are going to go ahead and see about getting approval for hyperbaric oxygen therapy for the patient. He is diabetic so we will follow diabetes protocol. If he has not had one recently we will also need to see about an EKG as well as a chest x-ray as he is a smoker. We will see patient back for reevaluation in 1 week here in the clinic. If anything worsens or changes patient will contact our office for additional recommendations. Electronic Signature(s) Signed: 06/21/2019 5:17:23 PM By: Worthy Keeler PA-C Entered By: Worthy Keeler on 06/21/2019 17:17:23 [name] ([id number]) -------------------------------------------------------------------------------- ROS/PFSH Details Patient Name: [name] Date of Service: 06/21/2019 2:00 PM Medical Record Number: [id number] Patient Account Number: [id number] Date of Birth/Sex: 18-Oct-1965 (53 y.o. M) Treating RN: Montey Hora Primary Care Provider: Johny Drilling Other Clinician: Referring Provider: Johny Drilling Treating Provider/Extender: Melburn Hake, HOYT Weeks in Treatment: 1 Information Obtained From Patient Constitutional Symptoms (General Health) Complaints and Symptoms: Negative for: Fatigue; Fever; Chills; Marked Weight Change Respiratory Complaints and Symptoms: Negative for: Chronic or frequent coughs; Shortness of Breath Medical History: Negative for: Aspiration; Asthma; Chronic Obstructive Pulmonary Disease (COPD); Pneumothorax; Sleep Apnea; Tuberculosis Cardiovascular Complaints and Symptoms: Negative for: Chest pain; LE edema Medical History: Positive for: Hypertension Negative for: Angina; Arrhythmia; Congestive Heart Failure; Coronary Artery Disease; Deep Vein Thrombosis; Hypotension; Myocardial Infarction; Peripheral  Arterial Disease; Peripheral Venous Disease; Phlebitis; Vasculitis Psychiatric Complaints and Symptoms: Negative for: Anxiety; Claustrophobia Medical History: Negative for: Anorexia/bulimia; Confinement Anxiety Eyes Medical History: Positive for: Cataracts Negative for: Glaucoma; Optic Neuritis Ear/Nose/Mouth/Throat Medical History: Negative for: Chronic sinus problems/congestion; Middle ear problems Hematologic/Lymphatic Medical History: Negative for: Anemia; Hemophilia; Human Immunodeficiency Virus; Lymphedema; Sickle Cell Disease [name], [name] ([id number]) Gastrointestinal Medical History: Negative for: Cirrhosis ; Colitis; Crohnos; Hepatitis A;  Hepatitis B; Hepatitis C Endocrine Medical History: Positive for: Type II Diabetes Treated with: Oral agents Genitourinary Medical History: Negative for: End Stage Renal Disease Immunological Medical History: Negative for: Lupus Erythematosus; Raynaudos; Scleroderma Integumentary (Skin) Medical History: Negative for: History of Burn; History of pressure wounds Musculoskeletal Medical History: Negative for: Gout; Rheumatoid Arthritis; Osteoarthritis; Osteomyelitis Neurologic Medical History: Negative for: Dementia; Neuropathy; Quadriplegia; Paraplegia; Seizure Disorder Oncologic Medical History: Negative for: Received Chemotherapy; Received Radiation HBO Extended History Items Eyes: Cataracts Immunizations Pneumococcal Vaccine: Received Pneumococcal Vaccination: No Implantable Devices None Hospitalization / Surgery History Type of Hospitalization/Surgery [facility] for amputation Family and Social History [name], [name] ([id number]) Cancer: No; Diabetes: Yes - Father; Heart Disease: No; Hypertension: Yes - Father; Kidney Disease: No; Lung Disease: No; Seizures: No; Stroke: Yes - Siblings; Thyroid Problems: No; Tuberculosis: No; Current every day smoker; Marital Status - Single; Alcohol Use: Daily; Drug Use: No History; Caffeine  Use: Moderate; Financial Concerns: No; Food, Clothing or Shelter Needs: No; Support System Lacking: No; Transportation Concerns: No Physician Affirmation I have reviewed and agree with the above information. Electronic Signature(s) Signed: 06/21/2019 6:05:24 PM By: Worthy Keeler PA-C Signed: 06/22/2019 5:03:49 PM By: Montey Hora Entered By: Worthy Keeler on 06/21/2019 17:14:57 [name] ([id number]) -------------------------------------------------------------------------------- SuperBill Details Patient Name: [name] Date of Service: 06/21/2019 Medical Record Number: [id number] Patient Account Number: [id number] Date of Birth/Sex: 03-15-1966 (54 y.o. 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) I10 Essential (primary) hypertension Facility Procedures CPT4 Code: [id number] Description: [id number] - DEB BONE 20 SQ CM/< ICD-10 Diagnosis Description L97.512 Non-pressure chronic ulcer of other part of right foot with fat Modifier: layer exposed Quantity: 1 Physician Procedures CPT4: Description Modifier Quantity Code [id number] - WC PHYS LEVEL 4 - EST PT 25 1 ICD-10 Diagnosis Description E11.621 Type 2 diabetes mellitus with foot ulcer L97.512 Non-pressure chronic ulcer of other part of right foot with fat layer exposed  M86.672 Other chronic osteomyelitis, left ankle and 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

## 2019-06-21 NOTE — Progress Notes (Addendum)
DELTON, WINDLEY ([id number]) Visit Report for 06/21/2019 Arrival Information Details Patient Name: FAMOUS, SPELLER Date of Service: 06/21/2019 2:00 PM Medical Record Number: [id number] Patient Account Number: [id number] Date of Birth/Sex: 1966/08/01 (53 y.o. M) Treating RN: Army Melia Primary Care Rube Sanchez: Johny Drilling Other Clinician: Referring Londen Lorge: Johny Drilling Treating Ellason Segar/Extender: Melburn Hake, HOYT Weeks in Treatment: 1 Visit Information History Since Last Visit Added or deleted any medications: No Patient Arrived: Ambulatory Any new allergies or adverse reactions: No Arrival Time: 14:18 Had a fall or experienced change in No Accompanied By: spouse activities of daily living that may affect Transfer Assistance: None risk of falls: Patient Identification Verified: Yes Signs or symptoms of abuse/neglect since last visito No Hospitalized since last visit: No Has Dressing in Place as Prescribed: Yes Pain Present Now: No Electronic Signature(s) Signed: 06/21/2019 3:53:22 PM By: Army Melia Entered By: Army Melia on 06/21/2019 14:18:48 [name] ([id number]) -------------------------------------------------------------------------------- Encounter Discharge Information Details Patient Name: [name] Date of Service: 06/21/2019 2:00 PM Medical Record Number: [id number] Patient Account Number: [id number] Date of Birth/Sex: [birth year] (53 y.o. M) Treating RN: Montey Hora Primary Care Allie Gerhold: Johny Drilling Other Clinician: Referring Dylin Ihnen: Johny Drilling Treating Katria Botts/Extender: Melburn Hake, HOYT Weeks in Treatment: 1 Encounter Discharge Information Items Post Procedure Vitals Discharge Condition: Stable Temperature (F): 98.8 Ambulatory Status: Ambulatory Pulse (bpm): 88 Discharge Destination: Home Respiratory Rate (breaths/min): 16 Transportation: Private Auto Blood Pressure (mmHg): 154/87 Accompanied By: wife Schedule Follow-up Appointment:  Yes Clinical Summary of Care: Electronic Signature(s) Signed: 06/21/2019 4:20:48 PM By: Montey Hora Entered By: Montey Hora on 06/21/2019 14:52:54 [name] ([id number]) -------------------------------------------------------------------------------- Lower Extremity Assessment Details Patient Name: [name] Date of Service: 06/21/2019 2:00 PM Medical Record Number: [id number] Patient Account Number: [id number] Date of Birth/Sex: 1966/05/27 (53 y.o. M) Treating RN: Army Melia Primary Care Sherlie Boyum: Johny Drilling Other Clinician: Referring Estel Tonelli: Johny Drilling Treating Linda Grimmer/Extender: STONE III, HOYT Weeks in Treatment: 1 Edema Assessment Assessed: [Left: No] [Right: No] Edema: [Left: No] [Right: No] Vascular Assessment Pulses: Dorsalis Pedis Palpable: [Left:Yes] [Right:Yes] Electronic Signature(s) Signed: 06/21/2019 3:53:22 PM By: Army Melia Entered By: Army Melia on 06/21/2019 14:24:51 [name] ([id number]) -------------------------------------------------------------------------------- Multi Wound Chart Details Patient Name: [name] Date of Service: 06/21/2019 2:00 PM Medical Record Number: [id number] Patient Account Number: [id number] Date of Birth/Sex: 1966/09/10 (53 y.o. M) Treating RN: Montey Hora Primary Care Mykia Holton: Johny Drilling Other Clinician: Referring Davelle Anselmi: Johny Drilling Treating Nathin Saran/Extender: Melburn Hake, HOYT Weeks in Treatment: 1 Vital Signs Height(in): 75 Pulse(bpm): 23 Weight(lbs): 240 Blood Pressure(mmHg): 154/87 Body Mass Index(BMI): 30 Temperature(F): 98.8 Respiratory Rate 16 (breaths/min): Photos: [N/A:N/A] Wound Location: Right Toe Great Left Toe Second N/A Wounding Event: Gradually Appeared Gradually Appeared N/A Primary Etiology: Diabetic Wound/Ulcer of the Diabetic Wound/Ulcer of the N/A Lower Extremity Lower Extremity Comorbid History: Cataracts, Hypertension, Type Cataracts, Hypertension, Type  N/A II Diabetes II Diabetes Date Acquired: 05/23/2019 05/30/2019 N/A Weeks of Treatment: 1 1 N/A Wound Status: Open Open N/A Pending Amputation on Yes Yes N/A Presentation: Measurements L x W x D 0.1x0.1x0.1 1.2x2.1x1.2 N/A (cm) Area (cm) : 0.008 1.979 N/A Volume (cm) : 0.001 2.375 N/A % Reduction in Area: 91.50% 25.00% N/A % Reduction in Volume: 97.40% -12.50% N/A Classification: Grade 1 Grade 3 N/A Exudate Amount: None Present Medium N/A Exudate Type: N/A Serosanguineous N/A Exudate Color: N/A red, brown N/A Wound Margin: N/A Distinct, outline attached N/A Granulation Amount: None Present (0%) Medium (34-66%) N/A Granulation Quality: N/A Red N/A Necrotic Amount: Large (67-100%) Medium (34-66%) N/A Necrotic Tissue:  Eschar, Tiburones N/A Exposed Structures: Fascia: No Fat Layer (Subcutaneous N/A Fat Layer (Subcutaneous Tissue) Exposed: Yes Tissue) Exposed: No Bone: Yes AZARIAN, MCGUIGAN ([id number]) Tendon: No Fascia: No Muscle: No Tendon: No Joint: No Muscle: No Bone: No Joint: No Epithelialization: Large (67-100%) None N/A Debridement: N/A Debridement - Excisional N/A Pre-procedure N/A 14:42 N/A Verification/Time Out Taken: Pain Control: N/A Lidocaine 4% Topical Solution N/A Tissue Debrided: N/A Bone, Callus, Subcutaneous, N/A Slough Level: N/A Skin/Subcutaneous N/A Tissue/Muscle/Bone Debridement Area (sq cm): N/A 2.52 N/A Instrument: N/A Curette N/A Bleeding: N/A Minimum N/A Hemostasis Achieved: N/A Pressure N/A Procedural Pain: N/A 0 N/A Post Procedural Pain: N/A 0 N/A Debridement Treatment N/A Procedure was tolerated well N/A Response: Post Debridement N/A 1.2x2.1x1.4 N/A Measurements L x W x D (cm) Post Debridement Volume: N/A 2.771 N/A (cm) Procedures Performed: N/A Debridement N/A Treatment Notes Wound #1 (Right Toe Great) Notes foam and coverlet and surgical shoes for both feet Wound #2 (Left Toe Second) Notes silvercel,  conform and surgical shoes for both feet Electronic Signature(s) Signed: 06/23/2019 10:28:22 AM By: Montey Hora Previous Signature: 06/21/2019 4:20:48 PM Version By: Montey Hora Entered By: Montey Hora on 06/23/2019 10:28:22 [name] ([id number]) -------------------------------------------------------------------------------- Multi-Disciplinary Care Plan Details Patient Name: [name] Date of Service: 06/21/2019 2:00 PM Medical Record Number: [id number] Patient Account Number: [id number] Date of Birth/Sex: 22-Feb-1966 (52 y.o. M) Treating RN: Montey Hora Primary Care Savvas Roper: Johny Drilling Other Clinician: Referring Primrose Oler: Johny Drilling Treating Echo Allsbrook/Extender: Melburn Hake, HOYT Weeks in Treatment: 1 Active Inactive Abuse / Safety / Falls / Self Care Management Nursing Diagnoses: Potential for falls Goals: Patient will not experience any injury related to falls Date Initiated: 06/14/2019 Target Resolution Date: 09/03/2019 Goal Status: Active Interventions: Assess fall risk on admission and as needed Notes: Necrotic Tissue Nursing Diagnoses: Knowledge deficit related to management of necrotic/devitalized tissue Goals: Patient/caregiver will verbalize understanding of reason and process for debridement of necrotic tissue Date Initiated: 06/14/2019 Target Resolution Date: 09/03/2019 Goal Status: Active Interventions: Assess patient pain level pre-, during and post procedure and prior to discharge Notes: Orientation to the Wound Care Program Nursing Diagnoses: Knowledge deficit related to the wound healing center program Goals: Patient/caregiver will verbalize understanding of the [place] Date Initiated: 06/14/2019 Target Resolution Date: 09/03/2019 Goal Status: Active Interventions: Provide education on orientation to the wound center [place], Eddie Dibbles ([id number]) Notes: Peripheral Neuropathy Nursing Diagnoses: Potential alteration in  peripheral tissue perfusion (select prior to confirmation of diagnosis) Goals: Patient/caregiver will verbalize understanding of disease process and disease management Date Initiated: 06/14/2019 Target Resolution Date: 09/03/2019 Goal Status: Active Interventions: Assess signs and symptoms of neuropathy upon admission and as needed Notes: Wound/Skin Impairment Nursing Diagnoses: Impaired tissue integrity Goals: Ulcer/skin breakdown will heal within 14 weeks Date Initiated: 06/14/2019 Target Resolution Date: 09/03/2019 Goal Status: Active Interventions: Assess patient/caregiver ability to obtain necessary supplies Assess patient/caregiver ability to perform ulcer/skin care regimen upon admission and as needed Assess ulceration(s) every visit Notes: Electronic Signature(s) Signed: 06/21/2019 4:20:48 PM By: Montey Hora Entered By: Montey Hora on 06/21/2019 14:38:27 [name] ([id number]) -------------------------------------------------------------------------------- Pain Assessment Details Patient Name: [name] Date of Service: 06/21/2019 2:00 PM Medical Record Number: [id number] Patient Account Number: [id number] Date of Birth/Sex: 1966-03-23 (53 y.o. M) Treating RN: Army Melia Primary Care Renay Crammer: Johny Drilling Other Clinician: Referring Eligio Angert: Johny Drilling Treating Etheleen Valtierra/Extender: Melburn Hake, HOYT Weeks in Treatment: 1 Active Problems Location of Pain Severity and Description of Pain Patient Has Paino No Site Locations Pain  Management and Medication Current Pain Management: Electronic Signature(s) Signed: 06/21/2019 3:53:22 PM By: Army Melia Entered By: Army Melia on 06/21/2019 14:18:52 [name] ([id number]) -------------------------------------------------------------------------------- Patient/Caregiver Education Details Patient Name: [name] Date of Service: 06/21/2019 2:00 PM Medical Record Number: [id number] Patient Account Number:  [id number] Date of Birth/Gender: 12-10-1965 (53 y.o. M) Treating RN: Montey Hora Primary Care Physician: Johny Drilling Other Clinician: Referring Physician: Johny Drilling Treating Physician/Extender: Sharalyn Ink in Treatment: 1 Education Assessment Education Provided To: Patient and Caregiver Education Topics Provided Hyperbaric Oxygenation: Handouts: Other: purpose of HBOT Methods: Explain/Verbal Responses: State content correctly Electronic Signature(s) Signed: 06/21/2019 4:20:48 PM By: Montey Hora Entered By: Montey Hora on 06/21/2019 14:51:46 [name] ([id number]) -------------------------------------------------------------------------------- Wound Assessment Details Patient Name: [name] Date of Service: 06/21/2019 2:00 PM Medical Record Number: [id number] Patient Account Number: [id number] Date of Birth/Sex: [birth year] (53 y.o. M) Treating RN: Army Melia Primary Care Conlee Sliter: Johny Drilling Other Clinician: Referring Rykar Lebleu: Johny Drilling Treating Norman Piacentini/Extender: Melburn Hake, HOYT Weeks in Treatment: 1 Wound Status Wound Number: 1 Primary Etiology: Diabetic Wound/Ulcer of the Lower Extremity Wound Location: Right Toe Great Wound Status: Open Wounding Event: Gradually Appeared Comorbid Cataracts, Hypertension, Type II Diabetes Date Acquired: 05/23/2019 History: Weeks Of Treatment: 1 Clustered Wound: No Pending Amputation On Presentation Photos Wound Measurements Length: (cm) 0.1 % Reduction i Width: (cm) 0.1 % Reduction i Depth: (cm) 0.1 Epithelializa Area: (cm) 0.008 Tunneling: Volume: (cm) 0.001 Undermining: n Area: 91.5% n Volume: 97.4% tion: Large (67-100%) No No Wound Description Classification: Grade 1 Foul Odor Af Exudate Amount: None Present Slough/Fibri ter Cleansing: No no Yes Wound Bed Granulation Amount: None Present (0%) Exposed Structure Necrotic Amount: Large (67-100%) Fascia Exposed: No Necrotic  Quality: Eschar, Adherent Slough Fat Layer (Subcutaneous Tissue) Exposed: No Tendon Exposed: No Muscle Exposed: No Joint Exposed: No Bone Exposed: No Treatment Notes Wound #1 (Right Toe ELIS, SIMONINI ([id number]) Notes foam and coverlet and surgical shoes for both feet Electronic Signature(s) Signed: 06/21/2019 3:53:22 PM By: Army Melia Entered By: Army Melia on 06/21/2019 14:24:12 [name] ([id number]) -------------------------------------------------------------------------------- Wound Assessment Details Patient Name: [name] Date of Service: 06/21/2019 2:00 PM Medical Record Number: [id number] Patient Account Number: [id number] Date of Birth/Sex: Mar 31, 1966 (53 y.o. M) Treating RN: Army Melia Primary Care Mabry Tift: Johny Drilling Other Clinician: Referring Nahshon Reich: Johny Drilling Treating Lantz Hermann/Extender: Melburn Hake, HOYT Weeks in Treatment: 1 Wound Status Wound Number: 2 Primary Etiology: Diabetic Wound/Ulcer of the Lower Extremity Wound Location: Left Toe Second Wound Status: Open Wounding Event: Gradually Appeared Comorbid Cataracts, Hypertension, Type II Diabetes Date Acquired: 05/30/2019 History: Weeks Of Treatment: 1 Clustered Wound: No Pending Amputation On Presentation Photos Wound Measurements Length: (cm) 1.2 % Reduction Width: (cm) 2.1 % Reduction Depth: (cm) 1.2 Epithelializ Area: (cm) 1.979 Tunneling: Volume: (cm) 2.375 Undermining in Area: 25% in Volume: -12.5% ation: None No : No Wound Description Classification: Grade 3 Foul Odor A Wound Margin: Distinct, outline attached Slough/Fibr Exudate Amount: Medium Exudate Type: Serosanguineous Exudate Color: red, brown fter Cleansing: No ino Yes Wound Bed Granulation Amount: Medium (34-66%) Exposed Structure Granulation Quality: Red Fascia Exposed: No Necrotic Amount: Medium (34-66%) Fat Layer (Subcutaneous Tissue) Exposed: Yes Necrotic Quality: Adherent Slough,  Bone Tendon Exposed: No Muscle Exposed: No Joint Exposed: No Bone Exposed: Yes Treatment Notes FRANDY, EDMISON ([id number]) Wound #2 (Left Toe Second) Notes silvercel, conform and surgical shoes for both feet Electronic Signature(s) Signed: 06/23/2019 10:28:07 AM By: Montey Hora Signed: 06/23/2019 10:28:55 AM By: Army Melia Previous Signature: 06/21/2019  3:00:45 PM Version By: Worthy Keeler PA-C Previous Signature: 06/21/2019 3:53:22 PM Version By: Army Melia Entered By: Montey Hora on 06/23/2019 10:28:06 [name] ([id number]) -------------------------------------------------------------------------------- Vitals Details Patient Name: [name] Date of Service: 06/21/2019 2:00 PM Medical Record Number: [id number] Patient Account Number: [id number] Date of Birth/Sex: Dec 29, 1965 (53 y.o. M) Treating RN: Army Melia Primary Care Barrington Worley: Johny Drilling Other Clinician: Referring Nariah Morgano: Johny Drilling Treating Shlomo Seres/Extender: Melburn Hake, HOYT Weeks in Treatment: 1 Vital Signs Time Taken: 14:18 Temperature (F): 98.8 Height (in): 75 Pulse (bpm): 88 Weight (lbs): 240 Respiratory Rate (breaths/min): 16 Body Mass Index (BMI): 30 Blood Pressure (mmHg): 154/87 Reference Range: 80 - 120 mg / dl Electronic Signature(s) Signed: 06/21/2019 3:53:22 PM By: Army Melia Entered By: Army Melia on 06/21/2019 14:22:21

## 2019-06-21 NOTE — Progress Notes (Signed)
SELLERS, BELD ([id number]) Visit Report for 06/21/2019 HBO Risk Assessment Details Patient Name: [name], [name] Date of Service: 06/21/2019 2:00 PM Medical Record Number: [id number] Patient Account Number: [id number] Date of Birth/Sex: 1965/11/25 (53 y.o. M) Treating RN: Montey Hora Primary Care Corday Wyka: Johny Drilling Other Clinician: Referring Zhania Shaheen: Johny Drilling Treating Katrinia Straker/Extender: Melburn Hake, HOYT Weeks in Treatment: 1 HBO Risk Assessment Items Answer Any "Yes" answers must be brought to the hyperbaric physician's attention. Breathing or Lung problemso No Currently use tobacco productso Yes Used tobacco products in the pasto Yes Heart problemso No Do you take water pills (diuretic)o No Diabeteso Yes On Diabetes pillo Yes If yes, last time taken: this morning On Insulino No Dialysiso No Eye problems like glaucomao No Ear problems or surgeryo No Sinus Problemso No Cancero No Confinement Anxiety (Claustrophobia- fear of confined places)o No Any medical implants/devices that are fully or partially implanted or attached to your bodyo No Pregnanto No Seizureso No Notes Patient smokes cigars daily. Electronic Signature(s) Signed: 06/21/2019 4:20:48 PM By: Montey Hora Entered By: Montey Hora on 06/21/2019 15:00:09

## 2019-06-24 ENCOUNTER — Ambulatory Visit: Payer: BC Managed Care – PPO | Attending: Family Medicine

## 2019-06-27 ENCOUNTER — Ambulatory Visit
Admission: RE | Admit: 2019-06-27 | Discharge: 2019-06-27 | Disposition: A | Discharge status: Home / Self Care | Payer: BC Managed Care – PPO | Source: Ambulatory Visit | Attending: Physician Assistant | Admitting: Physician Assistant

## 2019-06-27 ENCOUNTER — Encounter: Payer: BC Managed Care – PPO | Admitting: Physician Assistant

## 2019-06-27 ENCOUNTER — Other Ambulatory Visit: Payer: Self-pay

## 2019-06-27 DIAGNOSIS — L97522 Non-pressure chronic ulcer of other part of left foot with fat layer exposed: Secondary | ICD-10-CM | POA: Diagnosis not present

## 2019-06-27 MED ORDER — GADOBUTROL 1 MMOL/ML IV SOLN
10.0000 mL | Freq: Once | INTRAVENOUS | Status: AC | PRN
  Administered 2019-06-27: 16:00:00 10 mL via INTRAVENOUS

## 2019-06-28 ENCOUNTER — Encounter: Payer: BC Managed Care – PPO | Admitting: Physician Assistant

## 2019-06-28 ENCOUNTER — Ambulatory Visit: Payer: BC Managed Care – PPO | Attending: Family Medicine

## 2019-06-28 ENCOUNTER — Ambulatory Visit: Payer: BC Managed Care – PPO | Admitting: Physician Assistant

## 2019-06-30 ENCOUNTER — Encounter: Payer: BC Managed Care – PPO | Attending: Physician Assistant | Admitting: Physician Assistant

## 2019-06-30 ENCOUNTER — Other Ambulatory Visit: Payer: Self-pay

## 2019-06-30 DIAGNOSIS — E1142 Type 2 diabetes mellitus with diabetic polyneuropathy: Secondary | ICD-10-CM | POA: Insufficient documentation

## 2019-06-30 DIAGNOSIS — Z89421 Acquired absence of other right toe(s): Secondary | ICD-10-CM | POA: Insufficient documentation

## 2019-06-30 DIAGNOSIS — X58XXXA Exposure to other specified factors, initial encounter: Secondary | ICD-10-CM | POA: Diagnosis not present

## 2019-06-30 DIAGNOSIS — M86672 Other chronic osteomyelitis, left ankle and foot: Secondary | ICD-10-CM | POA: Diagnosis not present

## 2019-06-30 DIAGNOSIS — F17200 Nicotine dependence, unspecified, uncomplicated: Secondary | ICD-10-CM | POA: Diagnosis not present

## 2019-06-30 DIAGNOSIS — S82892A Other fracture of left lower leg, initial encounter for closed fracture: Secondary | ICD-10-CM | POA: Insufficient documentation

## 2019-06-30 DIAGNOSIS — E1169 Type 2 diabetes mellitus with other specified complication: Secondary | ICD-10-CM | POA: Diagnosis not present

## 2019-06-30 DIAGNOSIS — Z8249 Family history of ischemic heart disease and other diseases of the circulatory system: Secondary | ICD-10-CM | POA: Insufficient documentation

## 2019-06-30 DIAGNOSIS — L97512 Non-pressure chronic ulcer of other part of right foot with fat layer exposed: Secondary | ICD-10-CM | POA: Insufficient documentation

## 2019-06-30 DIAGNOSIS — E11621 Type 2 diabetes mellitus with foot ulcer: Secondary | ICD-10-CM | POA: Insufficient documentation

## 2019-06-30 DIAGNOSIS — L97522 Non-pressure chronic ulcer of other part of left foot with fat layer exposed: Secondary | ICD-10-CM | POA: Diagnosis not present

## 2019-06-30 DIAGNOSIS — I10 Essential (primary) hypertension: Secondary | ICD-10-CM | POA: Diagnosis not present

## 2019-06-30 NOTE — Progress Notes (Signed)
AARUSH, Russell ([id number]) Visit Report for 06/30/2019 Arrival Information Details Patient Name: [name], HAGLE Date of Service: 06/30/2019 1:30 PM Medical Record Number: [id number] Patient Account Number: [id number] Date of Birth/Sex: 1966-05-14 (53 y.o. M) Treating RN: Montey Hora Primary Care Rolla Servidio: Johny Drilling Other Clinician: Referring Amarie Viles: Johny Drilling Treating Malayna Noori/Extender: Melburn Hake, HOYT Weeks in Treatment: 2 Visit Information History Since Last Visit Added or deleted any medications: No Patient Arrived: Ambulatory Any new allergies or adverse reactions: No Arrival Time: 13:45 Had a fall or experienced change in No Accompanied By: wife activities of daily living that may affect Transfer Assistance: None risk of falls: Patient Identification Verified: Yes Signs or symptoms of abuse/neglect since last visito No Secondary Verification Process Completed: Yes Hospitalized since last visit: No Implantable device outside of the clinic excluding No cellular tissue based products placed in the center since last visit: Has Dressing in Place as Prescribed: Yes Pain Present Now: No Electronic Signature(s) Signed: 06/30/2019 5:10:54 PM By: Montey Hora Entered By: Montey Hora on 06/30/2019 13:46:23 [name] ([id number]) -------------------------------------------------------------------------------- Encounter Discharge Information Details Patient Name: [name] Date of Service: 06/30/2019 1:30 PM Medical Record Number: [id number] Patient Account Number: [id number] Date of Birth/Sex: 1966-07-24 (53 y.o. M) Treating RN: Army Melia Primary Care Delailah Spieth: Johny Drilling Other Clinician: Referring Carigan Lister: Johny Drilling Treating Katlin Bortner/Extender: Melburn Hake, HOYT Weeks in Treatment: 2 Encounter Discharge Information Items Post Procedure Vitals Discharge Condition: Stable Temperature (F): 98.3 Ambulatory Status: Ambulatory Pulse (bpm): 91 Discharge  Destination: Home Respiratory Rate (breaths/min): 16 Transportation: Private Auto Blood Pressure (mmHg): 173/81 Accompanied By: wife Schedule Follow-up Appointment: Yes Clinical Summary of Care: Electronic Signature(s) Signed: 06/30/2019 5:01:07 PM By: Army Melia Entered By: Army Melia on 06/30/2019 14:21:07 [name] ([id number]) -------------------------------------------------------------------------------- Lower Extremity Assessment Details Patient Name: [name] Date of Service: 06/30/2019 1:30 PM Medical Record Number: [id number] Patient Account Number: [id number] Date of Birth/Sex: 18-Aug-1966 (53 y.o. M) Treating RN: Montey Hora Primary Care Makaelah Cranfield: Johny Drilling Other Clinician: Referring Blakely Maranan: Johny Drilling Treating Tamerra Merkley/Extender: Melburn Hake, HOYT Weeks in Treatment: 2 Edema Assessment Assessed: [Left: No] [Right: No] Edema: [Left: No] [Right: No] Vascular Assessment Pulses: Dorsalis Pedis Palpable: [Left:Yes] [Right:Yes] Electronic Signature(s) Signed: 06/30/2019 5:10:54 PM By: Montey Hora Entered By: Montey Hora on 06/30/2019 13:58:45 [name] ([id number]) -------------------------------------------------------------------------------- Multi Wound Chart Details Patient Name: [name] Date of Service: 06/30/2019 1:30 PM Medical Record Number: [id number] Patient Account Number: [id number] Date of Birth/Sex: [birth year] (53 y.o. 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 Comorbid History: Cataracts, Hypertension, Type Cataracts, Hypertension, Type N/A II Diabetes II Diabetes Date Acquired: 05/23/2019 05/30/2019 N/A Weeks of Treatment: 2 2 N/A Wound Status: Open Open N/A Pending Amputation on Yes Yes N/A Presentation: Measurements L x W x D 0.1x0.1x0.1 0.9x1.3x0.8 N/A (cm) Area (cm) : 0.008 0.919 N/A Volume (cm) : 0.001 0.735 N/A % Reduction in Area: 91.50% 65.20% N/A % Reduction in Volume: 97.40% 65.20% N/A Classification: Grade 1 Grade 3 N/A Exudate Amount: None Present Medium N/A Exudate Type: N/A Serosanguineous N/A Exudate Color: N/A red, brown N/A Wound Margin: Flat and Intact  Distinct, outline attached N/A Granulation Amount: None Present (0%) Medium (34-66%) N/A Granulation Quality: N/A Red N/A Necrotic Amount: Large (67-100%) Medium (34-66%) N/A Necrotic Tissue: Eschar Adherent Slough N/A Exposed Structures: Fascia: No Fat Layer (Subcutaneous N/A Fat Layer (Subcutaneous Tissue) Exposed: Yes Tissue) Exposed: No Bone: Yes Briceno, Eddie Dibbles ([id number]) Tendon: No Fascia: No Muscle: No Tendon: No Joint: No Muscle: No Bone: No Joint: No Limited to Skin Breakdown Epithelialization: Large (67-100%) None N/A Treatment Notes Electronic Signature(s) Signed: 06/30/2019 5:01:07 PM By: Army Melia Entered By: Army Melia on 06/30/2019 14:07:32 [name] ([id number]) -------------------------------------------------------------------------------- Multi-Disciplinary Care Plan Details Patient Name: [name] Date of Service: 06/30/2019 1:30 PM Medical Record Number: [id number] Patient Account Number: [id number] Date of Birth/Sex: [birth year] (53 y.o. M) Treating RN: Army Melia Primary Care Tahirah Sara: Johny Drilling Other Clinician: Referring Deveion Denz: Johny Drilling Treating Arn Mcomber/Extender: Melburn Hake, HOYT Weeks in Treatment: 2 Active  Inactive Abuse / Safety / Falls / Self Care Management Nursing Diagnoses: Potential for falls Goals: Patient will not experience any injury related to falls Date Initiated: 06/14/2019 Target Resolution Date: 09/03/2019 Goal Status: Active Interventions: Assess fall risk on admission and as needed Notes: Necrotic Tissue Nursing Diagnoses: Knowledge deficit related to management of necrotic/devitalized tissue Goals: Patient/caregiver will verbalize understanding of reason and process for debridement of necrotic tissue Date Initiated: 06/14/2019 Target Resolution Date: 09/03/2019 Goal Status: Active Interventions: Assess patient pain level pre-, during and post procedure and prior to discharge Notes: Orientation to the Wound Care Program Nursing Diagnoses: Knowledge deficit related to the wound healing center program Goals: Patient/caregiver will verbalize understanding of the [place] Date Initiated: 06/14/2019 Target Resolution Date: 09/03/2019 Goal Status: Active Interventions: Provide education on orientation to the wound center [place], Eddie Dibbles ([id number]) Notes: Peripheral Neuropathy Nursing Diagnoses: Potential alteration in peripheral tissue perfusion (select prior to confirmation of diagnosis) Goals: Patient/caregiver will verbalize understanding of disease process and disease management Date Initiated: 06/14/2019 Target Resolution Date: 09/03/2019 Goal Status: Active Interventions: Assess signs and symptoms of neuropathy upon admission and as needed Notes: Wound/Skin Impairment Nursing Diagnoses: Impaired tissue integrity Goals: Ulcer/skin breakdown will heal within 14 weeks Date Initiated: 06/14/2019 Target Resolution Date: 09/03/2019 Goal Status: Active Interventions: Assess patient/caregiver ability to obtain necessary supplies Assess patient/caregiver ability to perform ulcer/skin care regimen upon admission and as needed Assess ulceration(s)  every visit Notes: Electronic Signature(s) Signed: 06/30/2019 5:01:07 PM By: Army Melia Entered By: Army Melia on 06/30/2019 14:07:21 [name] ([id number]) -------------------------------------------------------------------------------- Pain Assessment Details Patient Name: [name] Date of Service: 06/30/2019 1:30 PM Medical Record Number: [id number] Patient Account Number: [id number] Date of Birth/Sex: 1966/04/04 (53 y.o. M) Treating RN: Montey Hora Primary Care Kyriana Yankee: Johny Drilling Other Clinician: Referring Anneta Rounds: Johny Drilling Treating Raylon Lamson/Extender: Melburn Hake, HOYT Weeks in Treatment: 2 Active Problems Location of Pain Severity and Description of Pain Patient Has Paino No Site Locations Pain Management and Medication Current Pain Management: Electronic Signature(s) Signed: 06/30/2019 5:10:54 PM By: Montey Hora Entered By: Montey Hora on 06/30/2019 13:46:32 [name] ([id number]) -------------------------------------------------------------------------------- Patient/Caregiver Education Details Patient Name: [name] Date of Service: 06/30/2019 1:30 PM Medical Record Number: [id number] Patient Account Number: [id number] Date of Birth/Gender: [birth year] (53 y.o. M) Treating RN: Army Melia Primary Care Physician: Johny Drilling Other Clinician: Referring Physician: Johny Drilling Treating Physician/Extender: Sharalyn Ink in Treatment: 2 Education Assessment Education Provided To: Patient Education Topics Provided Wound/Skin Impairment: Handouts: Caring for Your Ulcer Methods: Demonstration, Explain/Verbal Responses: State content correctly Electronic Signature(s) Signed: 06/30/2019 5:01:07 PM By:  Nicki Reaper, Dajea Entered By: Army Melia on 06/30/2019 14:19:55 [name] ([id number]) -------------------------------------------------------------------------------- Wound Assessment Details Patient Name: PRECILIANO, DELACY Date of  Service: 06/30/2019 1:30 PM Medical Record Number: [id number] Patient Account Number: [id number] Date of Birth/Sex: 30-Oct-1965 (53 y.o. M) Treating RN: Army Melia Primary Care Toddrick Sanna: Johny Drilling Other Clinician: Referring Knowledge Escandon: Johny Drilling Treating Eevie Lapp/Extender: Melburn Hake, HOYT Weeks in Treatment: 2 Wound Status Wound Number: 1 Primary Etiology: Diabetic Wound/Ulcer of the Lower Extremity Wound Location: Right Toe Great Wound Status: Healed - Epithelialized Wounding Event: Gradually Appeared Comorbid Cataracts, Hypertension, Type II Diabetes Date Acquired: 05/23/2019 History: Weeks Of Treatment: 2 Clustered Wound: No Pending Amputation On Presentation Photos Wound Measurements Length: (cm) 0 % Reduction Width: (cm) 0 % Reduction Depth: (cm) 0 Epitheliali Area: (cm) 0 Tunneling: Volume: (cm) 0 Underminin in Area: 100% in Volume: 100% zation: Large (67-100%) No g: No Wound Description Classification: Grade 1 Foul Odor Wound Margin: Flat and Intact Slough/Fib Exudate Amount: None Present After Cleansing: No rino No Wound Bed Granulation Amount: None Present (0%) Exposed Structure Necrotic Amount: Large (67-100%) Fascia Exposed: No Necrotic Quality: Eschar Fat Layer (Subcutaneous Tissue) Exposed: No Tendon Exposed: No Muscle Exposed: No Joint Exposed: No Bone Exposed: No Limited to Skin Breakdown Electronic Signature(s) FELIZ, WIXON ([id number]) Signed: 06/30/2019 5:01:07 PM By: Army Melia Entered By: Army Melia on 06/30/2019 14:18:06 [name] ([id number]) -------------------------------------------------------------------------------- Wound Assessment Details Patient Name: [name] Date of Service: 06/30/2019 1:30 PM Medical Record Number: [id number] Patient Account Number: [id number] Date of Birth/Sex: [birth year] (53 y.o. M) Treating RN: Montey Hora Primary Care Tevyn Codd: Johny Drilling Other Clinician: Referring Kourtland Coopman: Johny Drilling Treating Darien Kading/Extender: Melburn Hake, HOYT Weeks in Treatment: 2 Wound Status Wound Number: 2 Primary Etiology: Diabetic Wound/Ulcer of the Lower Extremity Wound Location: Left Toe Second Wound Status: Open Wounding Event: Gradually Appeared Comorbid Cataracts, Hypertension, Type II Diabetes Date Acquired: 05/30/2019 History: Weeks Of Treatment: 2 Clustered Wound: No Pending Amputation On Presentation Photos Wound Measurements Length: (cm) 0.9 % Reduction Width: (cm) 1.3 % Reduction Depth: (cm) 0.8 Epithelializ Area: (cm) 0.919 Tunneling: Volume: (cm) 0.735 Undermining in Area: 65.2% in Volume: 65.2% ation: None No : No Wound Description Classification: Grade 3 Foul Odor A Wound Margin: Distinct, outline attached Slough/Fibr Exudate Amount: Medium Exudate Type: Serosanguineous Exudate Color: red, brown fter Cleansing: No ino Yes Wound Bed Granulation Amount: Medium (34-66%) Exposed Structure Granulation Quality: Red Fascia Exposed: No Necrotic Amount: Medium (34-66%) Fat Layer (Subcutaneous Tissue) Exposed: Yes Necrotic Quality: Adherent Slough, Bone Tendon Exposed: No Muscle Exposed: No Joint Exposed: No Bone Exposed: Yes Treatment Notes THALMUS, BELOTTI ([id number]) Wound #2 (Left Toe Second) Notes iodoform packing strip, conform and surgical shoes for both feet Electronic Signature(s) Signed: 06/30/2019 5:10:54 PM By: Montey Hora Entered By: Montey Hora on 06/30/2019 13:58:18 [name] ([id number]) -------------------------------------------------------------------------------- Vitals Details Patient Name: [name] Date of Service: 06/30/2019 1:30 PM Medical Record Number: [id number] Patient Account Number: [id number] Date of Birth/Sex: 1966/03/16 (53 y.o. M) Treating RN: Montey Hora Primary Care Maurizio Geno: Johny Drilling Other Clinician: Referring Punam Broussard: Johny Drilling Treating Jullianna Gabor/Extender: Melburn Hake, HOYT Weeks in Treatment:  2 Vital Signs Time Taken: 13:46 Temperature (F): 98.3 Height (in): 75 Pulse (bpm): 91 Weight (lbs): 240 Respiratory Rate (breaths/min): 18 Body Mass Index (BMI): 30 Blood Pressure (mmHg): 172/88 Reference Range: 80 - 120 mg / dl Electronic Signature(s) Signed: 06/30/2019 5:10:54 PM By: Montey Hora Entered By: Montey Hora on 06/30/2019 13:48:46

## 2019-06-30 NOTE — Progress Notes (Addendum)
JAKYE, SAADEH ([id number]) Visit Report for 06/30/2019 Chief Complaint Document Details Patient Name: [name], [name] Date of Service: 06/30/2019 1:30 PM Medical Record Number: [id number] Patient Account Number: [id number] Date of Birth/Sex: 1965/12/12 (53 y.o. M) Treating RN: Army Melia Primary Care Provider: Johny Drilling Other Clinician: Referring Provider: Johny Drilling Treating Provider/Extender: Melburn Hake, Freada Twersky Weeks in Treatment: 2 Information Obtained from: Patient Chief Complaint Bilateral foot ulcers Electronic Signature(s) Signed: 06/30/2019 2:02:52 PM By: Worthy Keeler PA-C Entered By: Worthy Keeler on 06/30/2019 14:02:51 [name] ([id number]) -------------------------------------------------------------------------------- Debridement Details Patient Name: [name] Date of Service: 06/30/2019 1:30 PM Medical Record Number: [id number] Patient Account Number: [id number] Date of Birth/Sex: 1966-06-29 (53 y.o. M) Treating RN: Army Melia Primary Care Provider: Johny Drilling Other Clinician: Referring Provider: Johny Drilling Treating Provider/Extender: Melburn Hake, Sangeeta Youse Weeks in Treatment: 2 Debridement Performed for Wound #2 Left Toe Second Assessment: Performed By: Physician STONE III, Zykiria Bruening E., PA-C Debridement Type: Debridement Severity of Tissue Pre Fat layer exposed Debridement: Level of Consciousness (Pre- Awake and Alert procedure): Pre-procedure Verification/Time Yes - 14:12 Out Taken: Start Time: 14:13 Pain Control: Lidocaine Total Area Debrided (L x W): 0.9 (cm) x 1.3 (cm) = 1.17 (cm) Tissue and other material Viable, Non-Viable, Bone, Subcutaneous debrided: Level: Skin/Subcutaneous Tissue/Muscle/Bone Debridement Description: Excisional Instrument: Curette Bleeding: Moderate Hemostasis Achieved: Pressure End Time: 14:17 Response to Treatment: Procedure was tolerated well Level of Consciousness Awake and Alert (Post-procedure): Post  Debridement Measurements of Total Wound Length: (cm) 0.9 Width: (cm) 1.3 Depth: (cm) 0.8 Volume: (cm) 0.735 Character of Wound/Ulcer Post Debridement: Stable Severity of Tissue Post Debridement: Necrosis of bone Post Procedure Diagnosis Same as Pre-procedure Electronic Signature(s) Signed: 06/30/2019 5:01:07 PM By: Army Melia Signed: 06/30/2019 5:50:00 PM By: Worthy Keeler PA-C Entered By: Army Melia on 06/30/2019 14:17:11 [name] ([id number]) -------------------------------------------------------------------------------- HPI Details Patient Name: [name] Date of Service: 06/30/2019 1:30 PM Medical Record Number: [id number] Patient Account Number: [id number] Date of Birth/Sex: [birth year] (53 y.o. M) Treating RN: Army Melia Primary Care Provider: Johny Drilling Other Clinician: Referring Provider: Johny Drilling Treating Provider/Extender: Melburn Hake, Bernis Stecher Weeks in Treatment: 2 History of Present Illness HPI Description: 06/14/2019 on evaluation today patient appears to be doing somewhat poorly in regard to his bilateral feet. 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 He could not give me an exact time. With that being said he currently has an issue with his right great toe on the dorsal surface where he has an open wound. On the distal end of his left second toe he has an open wound with bone exposed and the bone does appear to be necrotic in my opinion it does not appear to be as firm as should be. With that being said he also has an x-ray which was taken on the visit to the ER this past Sunday, 06/12/2019 on that x-ray it appears that he has a comminuted fracture of the left great toe and a distal fracture of the left second toe. Again my suspicion is may be secondary to issues with osteomyelitis he has not had any injury to the  area. 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 He does have a history of diabetes mellitus type 2, peripheral neuropathy, again a right fifth toe amputation, and hypertension. 06/21/2019 on evaluation today patient appears  to be doing somewhat better with regard to his toe ulcer compared to last evaluation. He did grow 2 organisms that were cultured out which was revealed on the culture I reviewed today. Enterobacter was 1 of those and then the second with Pseudomonas. The good news is Cipro will help for both he has been taking Bactrim as well as Augmentin currently I do not think he needs to continue taking both of these I think we can switch to Cipro and cover pretty much everything that we need to this is good news. Fortunately there is no signs of systemic infection and overall I do feel like his toe ulcer is better he still has some necrotic bone on the surface that is noted as well as some hyper granular tissue and again necrotic tissue on the distal portion of the toe but again I think that cleaning this away we can hopefully get this area to heal up and not end up with another amputation that is what he is wanting to try to avoid at this time. 06/30/2019 on evaluation today patient actually appears to be doing quite well with regard to his toe ulcer all things considering. He did have an MRI which I reviewed that was completed and it did show that he has a comminuted fracture at the tip of the toe again this is likely due to the fact that we did obtain a sample of the bone which was necrotic and showed positive pathology for acute osteomyelitis. With that being said he also has evidence of potential septic arthritis of the DIP joint. All that was noted on MRI. With that being said his culture did show evidence of Pseudomonas and Enterobacter. He is on the Cipro which seems to be doing well they were actually pleased with this  to be honest. He has not heard from infectious disease as of yet. In regard to the EKG that we wanted him to have done he was actually somewhat concerned about this and really wants to see his primary care provider to see what he thinks about the hyperbarics in regard to the EKG or if the patient possibly needs to see a cardiologist before considering hyperbarics. Again I completely understand his concern in this regard I have no issues with that and my suggestion at this time would be that he go ahead and proceed with getting all that settled and clearing his mind before we consider the hyperbarics. Electronic Signature(s) Signed: 06/30/2019 2:22:44 PM By: Worthy Keeler PA-C Entered By: Worthy Keeler on 06/30/2019 14:22:44 [name] ([id number]) -------------------------------------------------------------------------------- Physical Exam Details Patient Name: [name] Date of Service: 06/30/2019 1:30 PM Medical Record Number: [id number] Patient Account Number: [id number] Date of Birth/Sex: 07/14/1966 (53 y.o. M) Treating RN: Army Melia Primary Care Provider: Johny Drilling Other Clinician: Referring Provider: Johny Drilling Treating Provider/Extender: Melburn Hake, Elmon Shader Weeks in Treatment: 2 Constitutional Well-nourished and well-hydrated in no acute distress. Respiratory normal breathing without difficulty. clear to auscultation bilaterally. Cardiovascular regular rate and rhythm with normal S1, S2. Psychiatric this patient is able to make decisions and demonstrates good insight into disease process. Alert and Oriented x 3. pleasant and cooperative. Notes Upon inspection patient's wound bed actually showed signs of some improvement with evidence of good granulation around the edge. He did still have some necrotic bone noted in the central portion of the wound which did require sharp debridement today. With that being said I performed this without any complication post  debridement the wound bed  appears to be doing little better there was some bleeding but that was controlled with pressure. Electronic Signature(s) Signed: 06/30/2019 2:23:15 PM By: Worthy Keeler PA-C Entered By: Worthy Keeler on 06/30/2019 14:23:15 [name] ([id number]) -------------------------------------------------------------------------------- Physician Orders Details Patient Name: [name] Date of Service: 06/30/2019 1:30 PM Medical Record Number: [id number] Patient Account Number: [id number] Date of Birth/Sex: 17-Aug-1966 (53 y.o. M) Treating RN: Army Melia Primary Care Provider: Johny Drilling Other Clinician: Referring Provider: Johny Drilling Treating Provider/Extender: Melburn Hake, Vayla Wilhelmi Weeks in Treatment: 2 Verbal / Phone Orders: No Diagnosis Coding ICD-10 Coding Code Description E11.621 Type 2 diabetes mellitus with foot ulcer L97.512 Non-pressure chronic ulcer of other part of right foot with fat layer exposed M86.672 Other chronic osteomyelitis, left ankle and foot L97.522 Non-pressure chronic ulcer of other part of left foot with fat layer exposed Z89.421 Acquired absence of other right toe(s) I10 Essential (primary) hypertension Wound Cleansing Wound #2 Left Toe Second o Dial antibacterial soap, wash wounds, rinse and pat dry prior to dressing wounds o May Shower, gently pat wound dry prior to applying new dressing. Primary Wound Dressing Wound #2 Left Toe Second o Iodoform packing Gauze Secondary Dressing Wound #2 Left Toe Second o Conform/Kerlix Dressing Change Frequency Wound #2 Left Toe Second o Change dressing every day. Follow-up Appointments o Return Appointment in 1 week. Off-Loading Wound #2 Left Toe Second o Open toe surgical shoe to: - both feet Hyperbaric Oxygen Therapy Wound #2 Left Toe Second o Evaluate for HBO Therapy o Indication: - chronic refractory osteomyelitis o If appropriate for treatment, begin HBOT per  protocol: o 2.0 ATA for 90 Minutes without Air Breaks o One treatment per day (delivered Monday through Friday unless otherwise specified in Special Instructions below): [name], [name] ([id number]) o Total # of Treatments: - 40 o Finger stick Blood Glucose Pre- and Post- HBOT Treatment. o Follow Hyperbaric Oxygen Glycemia Protocol GLYCEMIA INTERVENTIONS PROTOCOL PRE-HBO GLYCEMIA INTERVENTIONS ACTION INTERVENTION Obtain pre-HBO capillary blood glucose 1 (ensure physician order is in chart). A. Notify HBO physician and await physician orders. 2 If result is 70 mg/dl or below: B. If the result meets the hospital definition of a critical result, follow hospital policy. A. Give patient an 8 ounce Glucerna Shake, an 8 ounce Ensure, or 8 ounces of a Glucerna/Ensure equivalent dietary supplement*. B. Wait 30 minutes. C. Retest patientos capillary blood If result is 71 mg/dl to 130 mg/dl: glucose (CBG). D. If result greater than or equal to 110 mg/dl, proceed with HBO. If result less than 110 mg/dl, notify HBO physician and consider holding HBO. If result is 131 mg/dl to 249 mg/dl: A. Proceed with HBO. A. Notify HBO physician and await physician orders. B. It is recommended to hold HBO and If result is 250 mg/dl or greater: do blood/urine ketone testing. C. If the result meets the hospital definition of a critical result, follow hospital policy. POST-HBO GLYCEMIA INTERVENTIONS ACTION INTERVENTION Obtain post HBO capillary blood glucose 1 (ensure physician order is in chart). A. Notify HBO physician and await physician orders. 2 If result is 70 mg/dl or below: B. If the result meets the hospital definition of a critical result, follow hospital policy. A. Give patient an 8 ounce Glucerna Shake, an 8 ounce Ensure, or 8 ounces of a Glucerna/Ensure equivalent dietary supplement*. B. Wait 15 minutes for symptoms of hypoglycemia (i.e. nervousness, anxiety,  sweating, chills, If result is 71 mg/dl to 100 mg/dl: clamminess, irritability, confusion, tachycardia or dizziness). C. If patient asymptomatic,  discharge patient. If patient symptomatic, repeat capillary blood glucose (CBG) and notify HBO physician. [name], [name] ([id number]) If result is 101 mg/dl to 249 mg/dl: A. Discharge patient. A. Notify HBO physician and await physician orders. B. It is recommended to do blood/urine If result is 250 mg/dl or greater: ketone testing. C. If the result meets the hospital definition of a critical result, follow hospital policy. *Juice or candies are NOT equivalent products. If patient refuses the Glucerna or Ensure, please consult the hospital dietitian for an appropriate substitute. Electronic Signature(s) Signed: 06/30/2019 5:01:07 PM By: Army Melia Signed: 06/30/2019 5:50:00 PM By: Worthy Keeler PA-C Entered By: Army Melia on 06/30/2019 14:19:15 [name] ([id number]) -------------------------------------------------------------------------------- Problem List Details Patient Name: [name] Date of Service: 06/30/2019 1:30 PM Medical Record Number: [id number] Patient Account Number: [id number] Date of Birth/Sex: 1965-10-28 (53 y.o. M) Treating RN: Army Melia Primary Care Provider: Johny Drilling Other Clinician: Referring Provider: Johny Drilling Treating Provider/Extender: Melburn Hake, Calista Crain Weeks in Treatment: 2 Active Problems ICD-10 Evaluated Encounter Code Description Active Date Today Diagnosis E11.621 Type 2 diabetes mellitus with foot ulcer 06/14/2019 No Yes L97.512 Non-pressure chronic ulcer of other part of right foot with fat 06/14/2019 No Yes layer exposed M86.672 Other chronic osteomyelitis, left ankle and foot 06/21/2019 No Yes L97.522 Non-pressure chronic ulcer of other part of left foot with fat 06/14/2019 No Yes layer exposed Z89.421 Acquired absence of other right toe(s) 06/14/2019 No Yes I10 Essential (primary)  hypertension 06/14/2019 No Yes Inactive Problems Resolved Problems Electronic Signature(s) Signed: 06/30/2019 2:02:44 PM By: Worthy Keeler PA-C Entered By: Worthy Keeler on 06/30/2019 14:02:43 [name] ([id number]) -------------------------------------------------------------------------------- Progress Note Details Patient Name: [name] Date of Service: 06/30/2019 1:30 PM Medical Record Number: [id number] Patient Account Number: [id number] Date of Birth/Sex: 04-18-1966 (53 y.o. M) Treating RN: Army Melia Primary Care Provider: Johny Drilling Other Clinician: Referring Provider: Johny Drilling Treating Provider/Extender: Melburn Hake, Vahe Pienta Weeks in Treatment: 2 Subjective Chief Complaint Information obtained from Patient Bilateral foot ulcers History of Present Illness (HPI) 06/14/2019 on evaluation today patient appears to be doing somewhat poorly in regard to his bilateral feet. 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 He could not give me an exact time. With that being said he currently has an issue with his right great toe on the dorsal surface where he has an open wound. On the distal end of his left second toe he has an open wound with bone exposed and the bone does appear to be necrotic in my opinion it does not appear to be as firm as should be. With that being said he also has an x-ray which was taken on the visit to the ER this past Sunday, 06/12/2019 on that x-ray it appears that he has a comminuted fracture of the left great toe and a distal fracture of the left second toe. Again my suspicion is may be secondary to issues with osteomyelitis he has not had any injury to the area. 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 He does have a history of diabetes mellitus  type 2, peripheral neuropathy, again a right fifth toe amputation, and hypertension. 06/21/2019 on evaluation today patient appears to be doing somewhat better with regard to his toe ulcer compared to last evaluation. He did grow 2 organisms  that were cultured out which was revealed on the culture I reviewed today. Enterobacter was 1 of those and then the second with Pseudomonas. The good news is Cipro will help for both he has been taking Bactrim as well as Augmentin currently I do not think he needs to continue taking both of these I think we can switch to Cipro and cover pretty much everything that we need to this is good news. Fortunately there is no signs of systemic infection and overall I do feel like his toe ulcer is better he still has some necrotic bone on the surface that is noted as well as some hyper granular tissue and again necrotic tissue on the distal portion of the toe but again I think that cleaning this away we can hopefully get this area to heal up and not end up with another amputation that is what he is wanting to try to avoid at this time. 06/30/2019 on evaluation today patient actually appears to be doing quite well with regard to his toe ulcer all things considering. He did have an MRI which I reviewed that was completed and it did show that he has a comminuted fracture at the tip of the toe again this is likely due to the fact that we did obtain a sample of the bone which was necrotic and showed positive pathology for acute osteomyelitis. With that being said he also has evidence of potential septic arthritis of the DIP joint. All that was noted on MRI. With that being said his culture did show evidence of Pseudomonas and Enterobacter. He is on the Cipro which seems to be doing well they were actually pleased with this to be honest. He has not heard from infectious disease as of yet. In regard to the EKG that we wanted him to have done he was actually somewhat concerned about  this and really wants to see his primary care provider to see what he thinks about the hyperbarics in regard to the EKG or if the patient possibly needs to see a cardiologist before considering hyperbarics. Again I completely understand his concern in this regard I have no issues with that and my suggestion at this time would be that he go ahead and proceed with getting all that settled and clearing his mind before we consider the hyperbarics. Patient History Information obtained from Patient. Family History Diabetes - Father, Hypertension - Father, Stroke - Siblings, No family history of Cancer, Heart Disease, Kidney Disease, Lung Disease, Seizures, Thyroid Problems, Tuberculosis. Social History TIMARI, SUCHANEK ([id number]) Current every day smoker, Marital Status - Single, Alcohol Use - Daily, Drug Use - No History, Caffeine Use - Moderate. Medical History Eyes Patient has history of Cataracts Denies history of Glaucoma, Optic Neuritis Ear/Nose/Mouth/Throat Denies history of Chronic sinus problems/congestion, Middle ear problems Hematologic/Lymphatic Denies history of Anemia, Hemophilia, Human Immunodeficiency Virus, Lymphedema, Sickle Cell Disease Respiratory Denies history of Aspiration, Asthma, Chronic Obstructive Pulmonary Disease (COPD), Pneumothorax, Sleep Apnea, Tuberculosis Cardiovascular Patient has history of Hypertension Denies history of Angina, Arrhythmia, Congestive Heart Failure, Coronary Artery Disease, Deep Vein Thrombosis, Hypotension, Myocardial Infarction, Peripheral Arterial Disease, Peripheral Venous Disease, Phlebitis, Vasculitis Gastrointestinal Denies history of Cirrhosis , Colitis, Crohn s, Hepatitis A, Hepatitis B, Hepatitis C Endocrine Patient has history of Type II Diabetes Genitourinary Denies history of End Stage Renal Disease Immunological Denies history of Lupus Erythematosus, Raynaud s, Scleroderma Integumentary (Skin) Denies history of History of  Burn, History of pressure wounds Musculoskeletal Denies history of Gout, Rheumatoid Arthritis,  Osteoarthritis, Osteomyelitis Neurologic Denies history of Dementia, Neuropathy, Quadriplegia, Paraplegia, Seizure Disorder Oncologic Denies history of Received Chemotherapy, Received Radiation Psychiatric Denies history of Anorexia/bulimia, Confinement Anxiety Hospitalization/Surgery History - [facility] for amputation. Review of Systems (ROS) Constitutional Symptoms (General Health) Denies complaints or symptoms of Fatigue, Fever, Chills, Marked Weight Change. Respiratory Denies complaints or symptoms of Chronic or frequent coughs, Shortness of Breath. Psychiatric Denies complaints or symptoms of Anxiety, Claustrophobia. Objective Constitutional Well-nourished and well-hydrated in no acute distress. [name], [name] ([id number]) Vitals Time Taken: 1:46 PM, Height: 75 in, Weight: 240 lbs, BMI: 30, Temperature: 98.3 F, Pulse: 91 bpm, Respiratory Rate: 18 breaths/min, Blood Pressure: 172/88 mmHg. Respiratory normal breathing without difficulty. clear to auscultation bilaterally. Cardiovascular regular rate and rhythm with normal S1, S2. Psychiatric this patient is able to make decisions and demonstrates good insight into disease process. Alert and Oriented x 3. pleasant and cooperative. General Notes: Upon inspection patient's wound bed actually showed signs of some improvement with evidence of good granulation around the edge. He did still have some necrotic bone noted in the central portion of the wound which did require sharp debridement today. With that being said I performed this without any complication post debridement the wound bed appears to be doing little better there was some bleeding but that was controlled with pressure. Integumentary (Hair, Skin) Wound #1 status is Healed - Epithelialized. Original cause of wound was Gradually Appeared. The wound is located on the Right Toe Great.  The wound measures 0cm length x 0cm width x 0cm depth; 0cm^2 area and 0cm^3 volume. The wound is limited to skin breakdown. There is no tunneling or undermining noted. There is a none present amount of drainage noted. The wound margin is flat and intact. There is no granulation within the wound bed. There is a large (67-100%) amount of necrotic tissue within the wound bed including Eschar. Wound #2 status is Open. Original cause of wound was Gradually Appeared. The wound is located on the Left Toe Second. The wound measures 0.9cm length x 1.3cm width x 0.8cm depth; 0.919cm^2 area and 0.735cm^3 volume. There is bone and Fat Layer (Subcutaneous Tissue) Exposed exposed. There is no tunneling or undermining noted. There is a medium amount of serosanguineous drainage noted. The wound margin is distinct with the outline attached to the wound base. There is medium (34-66%) red granulation within the wound bed. There is a medium (34-66%) amount of necrotic tissue within the wound bed including Adherent Slough and Necrosis of Bone. 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 There was a Excisional Skin/Subcutaneous Tissue/Muscle/Bone Debridement with a total area of 1.17 sq cm performed by STONE III, Jolin Benavides E., PA-C. With the following instrument(s): Curette to remove Viable and Non-Viable tissue/material. Material removed includes Bone,Subcutaneous Tissue and after achieving pain  control using Lidocaine. A time out was conducted at 14:12, prior to the start of the procedure. A Moderate amount of bleeding was controlled with Pressure. The procedure was tolerated well. Post Debridement Measurements: 0.9cm length x 1.3cm width x 0.8cm depth;  0.735cm^3 volume. Character of Wound/Ulcer Post Debridement is stable. Severity of Tissue Post Debridement is: Necrosis of bone. Post procedure Diagnosis Wound #2: Same as Pre-Procedure Plan Wound Cleansing: Wound #2 Left Toe Second: Dial antibacterial soap, wash wounds, rinse and pat dry prior to dressing wounds May Shower, gently pat wound dry prior to applying new dressing. Primary Wound Dressing: Wound #2 Left Toe Second: Iodoform packing Gauze Secondary Dressing: Wound #2 Left Toe Second: Conform/Kerlix Dressing Change Frequency: Wound #2 Left Toe Second: Change dressing every day. Follow-up Appointments: Return Appointment in 1 week. Off-Loading: Wound #2 Left Toe Second: Open toe surgical shoe to: - both feet Hyperbaric Oxygen Therapy: Wound #2 Left Toe Second: Evaluate for HBO Therapy Indication: - chronic refractory osteomyelitis If appropriate for treatment, begin HBOT per protocol: 2.0 ATA for 90 Minutes without Air Breaks One treatment per day (delivered Monday through Friday unless otherwise specified in Special Instructions below): Total # of Treatments: - 40 Finger stick Blood Glucose Pre- and Post- HBOT Treatment. Follow Hyperbaric Oxygen Glycemia Protocol 1. Unfortunately they are having trouble him and his girlfriend getting the dressing to stay in place we are using a silver alginate. For that reason I would like to try something a little bit different. I think at this time that we can actually try packing strip to see how this will do for him that would definitely act as a wick to prevent anything from trying to close up and causing any further issues from an infection standpoint. We are to give that  a try. There was something that they had at home they have been using but she was not sure what this was called and again I am not recognizing what she is talking about. Nonetheless I asked her to bring that in with her next visit so that I can see and we may be able to order that if it seems like an appropriate treatment for the patient. 2. I am going to suggest as well that he continue with the Cipro. I prescribed a 14-day supply on 22 September so the patient does still have medication at this time. We will see how things progress and I will make any adjustments in the medication regimen that I need to until such a time as he sees the infectious disease specialist. 3. I do think the patient needs to continue to wear the offloading shoes I believe this can be beneficial. Mostek, [name] ([id number]) 4. I did discuss watching his diet especially carbohydrate intake as well as smoking which I think is a big factor in helping this area to heal. Overall those are the 2 key factors glycemic control and smoking cessation and again this was discussed at this visit particularly although we have also discussed this in the past. We will see patient back for reevaluation in 1 week here in the clinic. If anything worsens or changes patient will contact our office for additional recommendations. For the time being at patient's request we are going to hold off on the hyperbaric oxygen therapy until he is able to talk to his primary care provider and see about cardiac clearance. Electronic Signature(s) Signed: 06/30/2019 2:25:51 PM By: Worthy Keeler PA-C Entered By: Worthy Keeler on 06/30/2019 14:25:50 [name] ([id number]) -------------------------------------------------------------------------------- ROS/PFSH Details Patient Name: [name] Date of Service: 06/30/2019 1:30 PM Medical Record Number: [id number] Patient Account Number: [id number] Date of Birth/Sex: 08-Dec-1965 (53 y.o. M) Treating RN:  Army Melia Primary Care Provider: Johny Drilling Other Clinician: Referring  Provider: Johny Drilling Treating Provider/Extender: Melburn Hake, Tayva Easterday Weeks in Treatment: 2 Information Obtained From Patient Constitutional Symptoms (General Health) Complaints and Symptoms: Negative for: Fatigue; Fever; Chills; Marked Weight Change Respiratory Complaints and Symptoms: Negative for: Chronic or frequent coughs; Shortness of Breath Medical History: Negative for: Aspiration; Asthma; Chronic Obstructive Pulmonary Disease (COPD); Pneumothorax; Sleep Apnea; Tuberculosis Psychiatric Complaints and Symptoms: Negative for: Anxiety; Claustrophobia Medical History: Negative for: Anorexia/bulimia; Confinement Anxiety Eyes Medical History: Positive for: Cataracts Negative for: Glaucoma; Optic Neuritis Ear/Nose/Mouth/Throat Medical History: Negative for: Chronic sinus problems/congestion; Middle ear problems Hematologic/Lymphatic Medical History: Negative for: Anemia; Hemophilia; Human Immunodeficiency Virus; Lymphedema; Sickle Cell Disease Cardiovascular Medical History: Positive for: Hypertension Negative for: Angina; Arrhythmia; Congestive Heart Failure; Coronary Artery Disease; Deep Vein Thrombosis; Hypotension; Myocardial Infarction; Peripheral Arterial Disease; Peripheral Venous Disease; Phlebitis; Vasculitis Gastrointestinal [name], [name] ([id number]) Medical History: Negative for: Cirrhosis ; Colitis; Crohnos; Hepatitis A; Hepatitis B; Hepatitis C Endocrine Medical History: Positive for: Type II Diabetes Treated with: Oral agents Genitourinary Medical History: Negative for: End Stage Renal Disease Immunological Medical History: Negative for: Lupus Erythematosus; Raynaudos; Scleroderma Integumentary (Skin) Medical History: Negative for: History of Burn; History of pressure wounds Musculoskeletal Medical History: Negative for: Gout; Rheumatoid Arthritis; Osteoarthritis;  Osteomyelitis Neurologic Medical History: Negative for: Dementia; Neuropathy; Quadriplegia; Paraplegia; Seizure Disorder Oncologic Medical History: Negative for: Received Chemotherapy; Received Radiation HBO Extended History Items Eyes: Cataracts Immunizations Pneumococcal Vaccine: Received Pneumococcal Vaccination: No Implantable Devices None Hospitalization / Surgery History Type of Hospitalization/Surgery [facility] for amputation Family and Social History Cancer: No; Diabetes: Yes - Father; Heart Disease: No; Hypertension: Yes - Father; Kidney Disease: No; Lung Disease: No; Seizures: No; Stroke: Yes - Siblings; Thyroid Problems: No; Tuberculosis: No; Current every day smoker; Marital Status [name], [name] ([id number]) Single; Alcohol Use: Daily; Drug Use: No History; Caffeine Use: Moderate; Financial Concerns: No; Food, Clothing or Shelter Needs: No; Support System Lacking: No; Transportation Concerns: No Physician Affirmation I have reviewed and agree with the above information. Electronic Signature(s) Signed: 06/30/2019 5:01:07 PM By: Army Melia Signed: 06/30/2019 5:50:00 PM By: Worthy Keeler PA-C Entered By: Worthy Keeler on 06/30/2019 14:23:03 [name] ([id number]) -------------------------------------------------------------------------------- SuperBill Details Patient Name: [name] Date of Service: 06/30/2019 Medical Record Number: [id number] Patient Account Number: [id number] Date of Birth/Sex: Dec 27, 1965 (53 y.o. 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) I10 Essential (primary) hypertension Facility Procedures CPT4 Code: [id number] Description: [id number] - DEB BONE 20 SQ CM/< ICD-10 Diagnosis Description L97.522 Non-pressure chronic ulcer of other part of left foot with fat Modifier: layer exposed Quantity: 1 Physician Procedures CPT4: Description Modifier Quantity Code [id number] - WC PHYS LEVEL 4 - EST PT 25 1 ICD-10 Diagnosis Description E11.621 Type 2 diabetes mellitus with foot ulcer L97.512 Non-pressure chronic ulcer of other part of right foot with fat layer exposed  M86.672 Other chronic osteomyelitis, left ankle and foot L97.522 Non-pressure chronic ulcer of other part of left foot with fat layer exposed CPT4: [id number] Debridement; bone (includes epidermis, dermis, subQ tissue, muscle and/or fascia, if 1 performed) 1st 20 sqcm or  less ICD-10 Diagnosis Description L97.522 Non-pressure chronic ulcer of other part of left foot with fat layer exposed Electronic Signature(s) Signed: 06/30/2019 2:26:12 PM By: Worthy Keeler PA-C Entered By: Worthy Keeler on 06/30/2019 14:26:12

## 2019-07-05 ENCOUNTER — Encounter: Payer: BC Managed Care – PPO | Admitting: Physician Assistant

## 2019-07-05 ENCOUNTER — Ambulatory Visit: Payer: BC Managed Care – PPO | Admitting: Physician Assistant

## 2019-07-07 ENCOUNTER — Encounter: Payer: BC Managed Care – PPO | Admitting: Physician Assistant

## 2019-07-07 ENCOUNTER — Other Ambulatory Visit: Payer: Self-pay

## 2019-07-07 DIAGNOSIS — E11621 Type 2 diabetes mellitus with foot ulcer: Secondary | ICD-10-CM | POA: Diagnosis not present

## 2019-07-07 NOTE — Progress Notes (Addendum)
ARTYOM, BOOMER ([id number]) Visit Report for 07/07/2019 Chief Complaint Document Details Patient Name: [name], [name] Date of Service: 07/07/2019 1:30 PM Medical Record Number: [id number] Patient Account Number: [id number] Date of Birth/Sex: June 08, 1966 (53 y.o. M) Treating RN: Army Melia Primary Care Provider: Johny Drilling Other Clinician: Referring Provider: Johny Drilling Treating Provider/Extender: Melburn Hake, HOYT Weeks in Treatment: 3 Information Obtained from: Patient Chief Complaint Bilateral foot ulcers Electronic Signature(s) Signed: 07/07/2019 1:54:10 PM By: Worthy Keeler PA-C Entered By: Worthy Keeler on 07/07/2019 13:54:10 [name] ([id number]) -------------------------------------------------------------------------------- HPI Details Patient Name: [name] Date of Service: 07/07/2019 1:30 PM Medical Record Number: [id number] Patient Account Number: [id number] Date of Birth/Sex: [birth year] (53 y.o. M) Treating RN: Army Melia Primary Care Provider: Johny Drilling Other Clinician: Referring Provider: Johny Drilling Treating Provider/Extender: Melburn Hake, HOYT Weeks in Treatment: 3 History of Present Illness HPI Description: 06/14/2019 on evaluation today patient appears to be doing somewhat poorly in regard to his bilateral feet. 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 He could not give me an exact time. With that being said he currently has an issue with his right great toe on the dorsal surface where he has an open wound. On the distal end of his left second toe he has an open wound with bone exposed and the bone does appear to be necrotic in my opinion it does not appear to be as firm as should be. With that being said he also has an x-ray which was taken on the visit to the ER this past Sunday, 06/12/2019 on that x-ray it  appears that he has a comminuted fracture of the left great toe and a distal fracture of the left second toe. Again my suspicion is may be secondary to issues with osteomyelitis he has not had any injury to the area. 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 He does have a history of diabetes mellitus type 2, peripheral neuropathy, again a right fifth toe amputation, and hypertension. 06/21/2019 on evaluation today patient appears to be doing somewhat better with regard to his toe ulcer compared to last evaluation. He did grow 2 organisms that were cultured out which was revealed on the culture I reviewed today. Enterobacter was 1 of those and then the second with Pseudomonas. The good news is Cipro will help for both he has been taking Bactrim as well as Augmentin currently I do not think he needs to continue taking both of these I think we can switch to Cipro and cover pretty much everything that we need to this is good news. Fortunately there is no signs of systemic infection and overall I do feel like his toe ulcer is better he still has some necrotic bone on the surface that is noted as well as some hyper granular tissue and again necrotic tissue on the distal portion of the toe but again I think that cleaning this away we can hopefully get this area to heal up and not end up with another amputation that is what he is wanting to try to avoid at this time. 06/30/2019 on evaluation today patient actually appears to be doing quite well with regard to his toe ulcer all things considering. He did have an MRI which I reviewed that was completed and it did show that he has a  comminuted fracture at the tip of the toe again this is likely due to the fact that we did obtain a sample of the bone which was necrotic and showed positive pathology for acute osteomyelitis. With that being said he also has evidence of potential septic arthritis of  the DIP joint. All that was noted on MRI. With that being said his culture did show evidence of Pseudomonas and Enterobacter. He is on the Cipro which seems to be doing well they were actually pleased with this to be honest. He has not heard from infectious disease as of yet. In regard to the EKG that we wanted him to have done he was actually somewhat concerned about this and really wants to see his primary care provider to see what he thinks about the hyperbarics in regard to the EKG or if the patient possibly needs to see a cardiologist before considering hyperbarics. Again I completely understand his concern in this regard I have no issues with that and my suggestion at this time would be that he go ahead and proceed with getting all that settled and clearing his mind before we consider the hyperbarics. 07/07/2019 on evaluation today patient seems to be doing well with regard to his toe ulcer all things considering. With that being said he has not been without his antibiotics until just this morning he did not have a dose he does need a refill of this. With that being said he also states that he unfortunately has been still somewhat concerned about the hyperbarics nonetheless again I cannot argue with the fact that his foot seems to be doing much better in regard to the toe ulcer. Overall we have gotten the necrotic bone off by way of debridement when I sent the pathology and culture results and subsequently has been on the Cipro which seems to have done well for him as well. I cannot argue with the results in regard to this. With that being said I did refer him to infectious disease I have not seen or heard as to why he has not been contacted will need to follow-up on this. Electronic Signature(s) Signed: 07/07/2019 2:25:48 PM By: Worthy Keeler PA-C Entered By: Worthy Keeler on 07/07/2019 14:25:48 [name] ([id number]) [name], [name]  ([id number]) -------------------------------------------------------------------------------- Physical Exam Details Patient Name: [name] Date of Service: 07/07/2019 1:30 PM Medical Record Number: [id number] Patient Account Number: [id number] Date of Birth/Sex: 1966/04/23 (53 y.o. M) Treating RN: Army Melia Primary Care Provider: Johny Drilling Other Clinician: Referring Provider: Johny Drilling Treating Provider/Extender: Melburn Hake, HOYT Weeks in Treatment: 3 Constitutional Well-nourished and well-hydrated in no acute distress. Respiratory normal breathing without difficulty. clear to auscultation bilaterally. Cardiovascular regular rate and rhythm with normal S1, S2. Psychiatric this patient is able to make decisions and demonstrates good insight into disease process. Alert and Oriented x 3. pleasant and cooperative. Notes Patient's wound bed currently showed actually signs of excellent granulation around the edges of the wound. Fortunately there is no signs of active infection at this time systemically and even locally things seem to be doing quite well. The bone is exposed just in the very base of the wound but nothing like what it was before and it does not appear to be necrotic based on what I am seeing. Everything seems to be fairly solid and again he has excellent granulation tissue I just need to make sure they are not packing is too tightly with regard to the dressing changes that will allow the  wound to continue to heal appropriately. Electronic Signature(s) Signed: 07/07/2019 2:26:24 PM By: Worthy Keeler PA-C Entered By: Worthy Keeler on 07/07/2019 14:26:24 [name] ([id number]) -------------------------------------------------------------------------------- Physician Orders Details Patient Name: [name] Date of Service: 07/07/2019 1:30 PM Medical Record Number: [id number] Patient Account Number: [id number] Date of Birth/Sex: Feb 01, 1966 (53 y.o. M) Treating  RN: Army Melia Primary Care Provider: Johny Drilling Other Clinician: Referring Provider: Johny Drilling Treating Provider/Extender: Melburn Hake, HOYT Weeks in Treatment: 3 Verbal / Phone Orders: No Diagnosis Coding ICD-10 Coding Code Description E11.621 Type 2 diabetes mellitus with foot ulcer L97.512 Non-pressure chronic ulcer of other part of right foot with fat layer exposed M86.672 Other chronic osteomyelitis, left ankle and foot L97.522 Non-pressure chronic ulcer of other part of left foot with fat layer exposed Z89.421 Acquired absence of other right toe(s) I10 Essential (primary) hypertension Wound Cleansing Wound #2 Left Toe Second o Dial antibacterial soap, wash wounds, rinse and pat dry prior to dressing wounds o May Shower, gently pat wound dry prior to applying new dressing. Primary Wound Dressing Wound #2 Left Toe Second o Other: - pt own calcium alginate packing Secondary Dressing Wound #2 Left Toe Second o Conform/Kerlix Dressing Change Frequency Wound #2 Left Toe Second o Change dressing every day. Follow-up Appointments o Return Appointment in 1 week. Off-Loading Wound #2 Left Toe Second o Open toe surgical shoe to: - both feet Hyperbaric Oxygen Therapy Wound #2 Left Toe Second o Evaluate for HBO Therapy o Indication: - chronic refractory osteomyelitis o If appropriate for treatment, begin HBOT per protocol: o 2.0 ATA for 90 Minutes without Air Breaks o One treatment per day (delivered Monday through Friday unless otherwise specified in Special Instructions below): [name], [name] ([id number]) o Total # of Treatments: - 40 o Finger stick Blood Glucose Pre- and Post- HBOT Treatment. o Follow Hyperbaric Oxygen Glycemia Protocol Patient Medications Allergies: No Known Allergies Notifications Medication Indication Start End Cipro 07/07/2019 DOSE 1 - oral 500 mg tablet - 1 tablet oral taken 2 times a day for 15 days. Please  take atorvastatin every other day while on this medication and decrease oxycodone use GLYCEMIA INTERVENTIONS PROTOCOL PRE-HBO GLYCEMIA INTERVENTIONS ACTION INTERVENTION Obtain pre-HBO capillary blood glucose 1 (ensure physician order is in chart). A. Notify HBO physician and await physician orders. 2 If result is 70 mg/dl or below: B. If the result meets the hospital definition of a critical result, follow hospital policy. A. Give patient an 8 ounce Glucerna Shake, an 8 ounce Ensure, or 8 ounces of a Glucerna/Ensure equivalent dietary supplement*. B. Wait 30 minutes. C. Retest patientos capillary blood If result is 71 mg/dl to 130 mg/dl: glucose (CBG). D. If result greater than or equal to 110 mg/dl, proceed with HBO. If result less than 110 mg/dl, notify HBO physician and consider holding HBO. If result is 131 mg/dl to 249 mg/dl: A. Proceed with HBO. A. Notify HBO physician and await physician orders. B. It is recommended to hold HBO and If result is 250 mg/dl or greater: do blood/urine ketone testing. C. If the result meets the hospital definition of a critical result, follow hospital policy. POST-HBO GLYCEMIA INTERVENTIONS ACTION INTERVENTION Obtain post HBO capillary blood glucose 1 (ensure physician order is in chart). A. Notify HBO physician and await physician orders. 2 If result is 70 mg/dl or below: B. If the result meets the hospital definition of a critical result, follow hospital policy. If result is 71 mg/dl to 100 mg/dl: A. Give patient an  8 ounce Glucerna Shake, an 8 ounce Ensure, or 8 ounces of a Glucerna/Ensure equivalent dietary supplement*. [name], [name] ([id number]) B. Wait 15 minutes for symptoms of hypoglycemia (i.e. nervousness, anxiety, sweating, chills, clamminess, irritability, confusion, tachycardia or dizziness). C. If patient asymptomatic, discharge patient. If patient symptomatic, repeat capillary blood glucose (CBG)  and notify HBO physician. If result is 101 mg/dl to 249 mg/dl: A. Discharge patient. A. Notify HBO physician and await physician orders. B. It is recommended to do blood/urine If result is 250 mg/dl or greater: ketone testing. C. If the result meets the hospital definition of a critical result, follow hospital policy. *Juice or candies are NOT equivalent products. If patient refuses the Glucerna or Ensure, please consult the hospital dietitian for an appropriate substitute. Electronic Signature(s) Signed: 07/07/2019 2:29:57 PM By: Worthy Keeler PA-C Entered By: Worthy Keeler on 07/07/2019 14:29:57 [name] ([id number]) -------------------------------------------------------------------------------- Problem List Details Patient Name: [name] Date of Service: 07/07/2019 1:30 PM Medical Record Number: [id number] Patient Account Number: [id number] Date of Birth/Sex: November 21, 1965 (53 y.o. M) Treating RN: Army Melia Primary Care Provider: Johny Drilling Other Clinician: Referring Provider: Johny Drilling Treating Provider/Extender: Melburn Hake, HOYT Weeks in Treatment: 3 Active Problems ICD-10 Evaluated Encounter Code Description Active Date Today Diagnosis E11.621 Type 2 diabetes mellitus with foot ulcer 06/14/2019 No Yes L97.512 Non-pressure chronic ulcer of other part of right foot with fat 06/14/2019 No Yes layer exposed M86.672 Other chronic osteomyelitis, left ankle and foot 06/21/2019 No Yes L97.522 Non-pressure chronic ulcer of other part of left foot with fat 06/14/2019 No Yes layer exposed Z89.421 Acquired absence of other right toe(s) 06/14/2019 No Yes I10 Essential (primary) hypertension 06/14/2019 No Yes Inactive Problems Resolved Problems Electronic Signature(s) Signed: 07/07/2019 1:54:04 PM By: Worthy Keeler PA-C Entered By: Worthy Keeler on 07/07/2019 13:54:04 [name]  ([id number]) -------------------------------------------------------------------------------- Progress Note Details Patient Name: [name] Date of Service: 07/07/2019 1:30 PM Medical Record Number: [id number] Patient Account Number: [id number] Date of Birth/Sex: September 29, 1966 (53 y.o. M) Treating RN: Army Melia Primary Care Provider: Johny Drilling Other Clinician: Referring Provider: Johny Drilling Treating Provider/Extender: Melburn Hake, HOYT Weeks in Treatment: 3 Subjective Chief Complaint Information obtained from Patient Bilateral foot ulcers History of Present Illness (HPI) 06/14/2019 on evaluation today patient appears to be doing somewhat poorly in regard to his bilateral feet. 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 He could not give me an exact time. With that being said he currently has an issue with his right great toe on the dorsal surface where he has an open wound. On the distal end of his left second toe he has an open wound with bone exposed and the bone does appear to be necrotic in my opinion it does not appear to be as firm as should be. With that being said he also has an x-ray which was taken on the visit to the ER this past Sunday, 06/12/2019 on that x-ray it appears that he has a comminuted fracture of the left great toe and a distal fracture of the left second toe. Again my suspicion is may be secondary to issues with osteomyelitis he has not had any injury to the area. 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 He does have a history of diabetes mellitus type 2, peripheral neuropathy,  again a right fifth toe amputation, and hypertension. 06/21/2019 on evaluation today patient appears to be doing somewhat better with regard to his toe ulcer compared to  last evaluation. He did grow 2 organisms that were cultured out which was revealed on the culture I reviewed today. Enterobacter was 1 of those and then the second with Pseudomonas. The good news is Cipro will help for both he has been taking Bactrim as well as Augmentin currently I do not think he needs to continue taking both of these I think we can switch to Cipro and cover pretty much everything that we need to this is good news. Fortunately there is no signs of systemic infection and overall I do feel like his toe ulcer is better he still has some necrotic bone on the surface that is noted as well as some hyper granular tissue and again necrotic tissue on the distal portion of the toe but again I think that cleaning this away we can hopefully get this area to heal up and not end up with another amputation that is what he is wanting to try to avoid at this time. 06/30/2019 on evaluation today patient actually appears to be doing quite well with regard to his toe ulcer all things considering. He did have an MRI which I reviewed that was completed and it did show that he has a comminuted fracture at the tip of the toe again this is likely due to the fact that we did obtain a sample of the bone which was necrotic and showed positive pathology for acute osteomyelitis. With that being said he also has evidence of potential septic arthritis of the DIP joint. All that was noted on MRI. With that being said his culture did show evidence of Pseudomonas and Enterobacter. He is on the Cipro which seems to be doing well they were actually pleased with this to be honest. He has not heard from infectious disease as of yet. In regard to the EKG that we wanted him to have done he was actually somewhat concerned about this and really wants to see his primary care provider to see what he thinks about the hyperbarics in regard to the EKG or if the patient possibly needs to see a cardiologist before considering  hyperbarics. Again I completely understand his concern in this regard I have no issues with that and my suggestion at this time would be that he go ahead and proceed with getting all that settled and clearing his mind before we consider the hyperbarics. 07/07/2019 on evaluation today patient seems to be doing well with regard to his toe ulcer all things considering. With that being said he has not been without his antibiotics until just this morning he did not have a dose he does need a refill of this. With that being said he also states that he unfortunately has been still somewhat concerned about the hyperbarics nonetheless again I cannot argue with the fact that his foot seems to be doing much better in regard to the toe ulcer. Overall we have gotten the necrotic bone off by way of debridement when I sent the pathology and culture results and subsequently has been on the Cipro which seems to have done well for him as well. I cannot argue with the results in regard to this. With that being said I did refer him to infectious disease I have not seen or heard as to why he has not been contacted will need to follow-up on this.  [name], [name] ([id number]) Patient History Information obtained from Patient. Family History Diabetes - Father, Hypertension - Father, Stroke - Siblings, No family history of Cancer, Heart Disease, Kidney Disease, Lung Disease, Seizures, Thyroid Problems, Tuberculosis. Social History Current every day smoker, Marital Status - Single, Alcohol Use - Daily, Drug Use - No History, Caffeine Use - Moderate. Medical History Eyes Patient has history of Cataracts Denies history of Glaucoma, Optic Neuritis Ear/Nose/Mouth/Throat Denies history of Chronic sinus problems/congestion, Middle ear problems Hematologic/Lymphatic Denies history of Anemia, Hemophilia, Human Immunodeficiency Virus, Lymphedema, Sickle Cell Disease Respiratory Denies history of Aspiration, Asthma, Chronic  Obstructive Pulmonary Disease (COPD), Pneumothorax, Sleep Apnea, Tuberculosis Cardiovascular Patient has history of Hypertension Denies history of Angina, Arrhythmia, Congestive Heart Failure, Coronary Artery Disease, Deep Vein Thrombosis, Hypotension, Myocardial Infarction, Peripheral Arterial Disease, Peripheral Venous Disease, Phlebitis, Vasculitis Gastrointestinal Denies history of Cirrhosis , Colitis, Crohn s, Hepatitis A, Hepatitis B, Hepatitis C Endocrine Patient has history of Type II Diabetes Genitourinary Denies history of End Stage Renal Disease Immunological Denies history of Lupus Erythematosus, Raynaud s, Scleroderma Integumentary (Skin) Denies history of History of Burn, History of pressure wounds Musculoskeletal Denies history of Gout, Rheumatoid Arthritis, Osteoarthritis, Osteomyelitis Neurologic Denies history of Dementia, Neuropathy, Quadriplegia, Paraplegia, Seizure Disorder Oncologic Denies history of Received Chemotherapy, Received Radiation Psychiatric Denies history of Anorexia/bulimia, Confinement Anxiety Hospitalization/Surgery History - [facility] for amputation. Review of Systems (ROS) Constitutional Symptoms (General Health) Denies complaints or symptoms of Fatigue, Fever, Chills, Marked Weight Change. Respiratory Denies complaints or symptoms of Chronic or frequent coughs, Shortness of Breath. Cardiovascular Denies complaints or symptoms of Chest pain, LE edema. Psychiatric Denies complaints or symptoms of Anxiety, Claustrophobia. [name], [name] ([id number]) Objective Constitutional Well-nourished and well-hydrated in no acute distress. Vitals Time Taken: 1:47 PM, Height: 75 in, Weight: 240 lbs, BMI: 30, Temperature: 99.0 F, Pulse: 79 bpm, Respiratory Rate: 16 breaths/min, Blood Pressure: 172/88 mmHg. Respiratory normal breathing without difficulty. clear to auscultation bilaterally. Cardiovascular regular rate and rhythm with normal S1,  S2. Psychiatric this patient is able to make decisions and demonstrates good insight into disease process. Alert and Oriented x 3. pleasant and cooperative. General Notes: Patient's wound bed currently showed actually signs of excellent granulation around the edges of the wound. Fortunately there is no signs of active infection at this time systemically and even locally things seem to be doing quite well. The bone is exposed just in the very base of the wound but nothing like what it was before and it does not appear to be necrotic based on what I am seeing. Everything seems to be fairly solid and again he has excellent granulation tissue I just need to make sure they are not packing is too tightly with regard to the dressing changes that will allow the wound to continue to heal appropriately. Integumentary (Hair, Skin) Wound #2 status is Open. Original cause of wound was Gradually Appeared. The wound is located on the Left Toe Second. The wound measures 0.7cm length x 0.7cm width x 0.6cm depth; 0.385cm^2 area and 0.231cm^3 volume. There is Fat Layer (Subcutaneous Tissue) Exposed exposed. There is no tunneling or undermining noted. There is a medium amount of serosanguineous drainage noted. The wound margin is distinct with the outline attached to the wound base. There is large (67-100%) red granulation within the wound bed. There is a small (1-33%) amount of necrotic tissue within the wound bed including Adherent Slough. 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 Primary Wound Dressing: Wound #2 Left Toe Second: Other: - pt own calcium alginate packing Secondary Dressing: Wound #2 Left Toe Second: Conform/Kerlix Dressing Change Frequency: Wound #2 Left Toe Second: Change dressing every day. Follow-up Appointments: Return Appointment in 1 week. Off-Loading: Wound #2 Left Toe Second: Open toe surgical shoe to: - both feet Hyperbaric Oxygen Therapy: Wound #2 Left Toe Second: Evaluate for HBO Therapy Indication: - chronic refractory osteomyelitis If appropriate for treatment, begin HBOT per protocol: 2.0 ATA for 90 Minutes without Air Breaks One treatment per day (delivered Monday through Friday unless otherwise specified in Special Instructions below): Total # of Treatments: - 40 Finger stick Blood Glucose Pre- and Post- HBOT Treatment. Follow Hyperbaric Oxygen Glycemia Protocol The following medication(s) was prescribed: Cipro oral 500 mg tablet 1 1 tablet oral taken 2 times a day for 15 days. Please take atorvastatin every other day while on this medication and decrease oxycodone use starting 07/07/2019 1. My suggestion currently is can be that we go ahead and continue with the antibiotic which is the Cipro for the time being. He seems to be doing very well with this and my hope is that he will continue to do well as such. 2. With regard to the patient's wound bed he seems to be doing excellent and they have been using a plain alginate dressing. Overall I am very pleased with how things seem to be today. I really see no reason to change this up and since they seem to be pleased with the results as well I think we will get a continue with how things are at this point. 3. With regard to hyperbaric oxygen therapy the patient wants to still hold off on that for the time being. He still awaiting the referral to cardiology from his primary in order to  be evaluated there as long as things keep doing well he is hopeful maybe he will not even have to initiate treatment in that regard. We will see patient back for reevaluation in 1 week here in the clinic. If anything worsens or changes patient will contact our office for additional recommendations. Electronic Signature(s) Signed: 07/07/2019 2:31:28 PM By: Worthy Keeler PA-C Entered By: Worthy Keeler on 07/07/2019 14:31:28 [name] ([id number]) [name], [name] ([id number]) -------------------------------------------------------------------------------- ROS/PFSH Details Patient Name: [name] Date of Service: 07/07/2019 1:30 PM Medical Record Number: [id number] Patient Account Number: [id number] Date of Birth/Sex: 1966-08-21 (53 y.o. M) Treating RN: Army Melia Primary Care Provider: Johny Drilling Other Clinician: Referring Provider: Johny Drilling Treating Provider/Extender: Melburn Hake, HOYT Weeks in Treatment: 3 Information Obtained From Patient Constitutional Symptoms (General Health) Complaints and Symptoms: Negative for: Fatigue; Fever; Chills; Marked Weight Change Respiratory Complaints and Symptoms: Negative for: Chronic or frequent coughs; Shortness of Breath Medical History: Negative for: Aspiration; Asthma; Chronic Obstructive Pulmonary Disease (COPD); Pneumothorax; Sleep Apnea; Tuberculosis Cardiovascular Complaints and Symptoms: Negative for: Chest pain; LE edema Medical History: Positive for: Hypertension Negative for: Angina; Arrhythmia; Congestive Heart Failure; Coronary Artery Disease; Deep Vein Thrombosis; Hypotension; Myocardial Infarction; Peripheral Arterial Disease; Peripheral Venous Disease; Phlebitis; Vasculitis Psychiatric  Complaints and Symptoms: Negative for: Anxiety; Claustrophobia Medical History: Negative for: Anorexia/bulimia; Confinement Anxiety Eyes Medical History: Positive for: Cataracts Negative for: Glaucoma; Optic  Neuritis Ear/Nose/Mouth/Throat Medical History: Negative for: Chronic sinus problems/congestion; Middle ear problems Hematologic/Lymphatic Medical History: Negative for: Anemia; Hemophilia; Human Immunodeficiency Virus; Lymphedema; Sickle Cell Disease [name], [name] ([id number]) Gastrointestinal Medical History: Negative for: Cirrhosis ; Colitis; Crohnos; Hepatitis A; Hepatitis B; Hepatitis C Endocrine Medical History: Positive for: Type II Diabetes Treated with: Oral agents Genitourinary Medical History: Negative for: End Stage Renal Disease Immunological Medical History: Negative for: Lupus Erythematosus; Raynaudos; Scleroderma Integumentary (Skin) Medical History: Negative for: History of Burn; History of pressure wounds Musculoskeletal Medical History: Negative for: Gout; Rheumatoid Arthritis; Osteoarthritis; Osteomyelitis Neurologic Medical History: Negative for: Dementia; Neuropathy; Quadriplegia; Paraplegia; Seizure Disorder Oncologic Medical History: Negative for: Received Chemotherapy; Received Radiation HBO Extended History Items Eyes: Cataracts Immunizations Pneumococcal Vaccine: Received Pneumococcal Vaccination: No Implantable Devices None Hospitalization / Surgery History Type of Hospitalization/Surgery [facility] for amputation Family and Social History [name], [name] ([id number]) Cancer: No; Diabetes: Yes - Father; Heart Disease: No; Hypertension: Yes - Father; Kidney Disease: No; Lung Disease: No; Seizures: No; Stroke: Yes - Siblings; Thyroid Problems: No; Tuberculosis: No; Current every day smoker; Marital Status - Single; Alcohol Use: Daily; Drug Use: No History; Caffeine Use: Moderate; Financial Concerns: No; Food, Clothing or Shelter Needs: No; Support System Lacking: No; Transportation Concerns: No Physician Affirmation I have reviewed and agree with the above information. Electronic Signature(s) Signed: 07/07/2019 5:11:53 PM By: Worthy Keeler  PA-C Signed: 07/18/2019 8:11:06 AM By: Army Melia Entered By: Worthy Keeler on 07/07/2019 14:26:10 [name] ([id number]) -------------------------------------------------------------------------------- SuperBill Details Patient Name: [name] Date of Service: 07/07/2019 Medical Record Number: [id number] Patient Account Number: [id number] Date of Birth/Sex: 1966/01/30 (53 y.o. 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) I10 Essential (primary) hypertension Facility Procedures CPT4 Code: [id number] Description: 99213 - WOUND CARE VISIT-LEV 3 EST PT Modifier: Quantity: 1 Physician Procedures CPT4 Code: [id number] Description: 99214 - WC PHYS LEVEL 4 - EST PT ICD-10 Diagnosis Description E11.621 Type 2 diabetes mellitus with foot ulcer L97.512 Non-pressure chronic ulcer of other part of right foot with fat M86.672 Other chronic osteomyelitis, left ankle and foot  L97.522 Non-pressure chronic ulcer of other part of left foot with fat Modifier: layer exposed layer exposed Quantity: 1 Electronic Signature(s) Signed: 07/07/2019 2:31:38 PM By: Worthy Keeler PA-C Entered By: Worthy Keeler on 07/07/2019 14:31:38

## 2019-07-14 ENCOUNTER — Ambulatory Visit: Payer: BC Managed Care – PPO | Admitting: Physician Assistant

## 2019-07-18 NOTE — Progress Notes (Signed)
GRANVIL, SARAFIAN ([id number]) Visit Report for 07/07/2019 Arrival Information Details Patient Name: [name], [name] Date of Service: 07/07/2019 1:30 PM Medical Record Number: [id number] Patient Account Number: [id number] Date of Birth/Sex: November 26, 1965 (53 y.o. M) Treating RN: Army Melia Primary Care Khoa Opdahl: Johny Drilling Other Clinician: Referring Kiel Cockerell: Johny Drilling Treating Maeryn Mcgath/Extender: Melburn Hake, HOYT Weeks in Treatment: 3 Visit Information History Since Last Visit Added or deleted any medications: No Patient Arrived: Ambulatory Any new allergies or adverse reactions: No Arrival Time: 13:43 Had a fall or experienced change in No Accompanied By: girlfriend activities of daily living that may affect Transfer Assistance: None risk of falls: Patient Identification Verified: Yes Signs or symptoms of abuse/neglect since last visito No Secondary Verification Process Completed: Yes Hospitalized since last visit: No Implantable device outside of the clinic excluding No cellular tissue based products placed in the center since last visit: Has Dressing in Place as Prescribed: Yes Pain Present Now: No Electronic Signature(s) Signed: 07/07/2019 4:27:49 PM By: Lorine Bears RCP, RRT, CHT Entered By: Lorine Bears on 07/07/2019 13:46:54 [name] ([id number]) -------------------------------------------------------------------------------- Clinic Level of Care Assessment Details Patient Name: [name] Date of Service: 07/07/2019 1:30 PM Medical Record Number: [id number] Patient Account Number: [id number] Date of Birth/Sex: Dec 16, 1965 (53 y.o. M) Treating RN: Army Melia Primary Care Reyanna Baley: Johny Drilling Other Clinician: Referring Tryniti Laatsch: Johny Drilling Treating Tsuneo Faison/Extender: Melburn Hake, HOYT Weeks in Treatment: 3 Clinic Level of Care Assessment Items TOOL 4 Quantity Score []  - Use when only an EandM is performed on FOLLOW-UP visit  0 ASSESSMENTS - Nursing Assessment / Reassessment X - Reassessment of Co-morbidities (includes updates in patient status) 1 10 X- 1 5 Reassessment of Adherence to Treatment Plan ASSESSMENTS - Wound and Skin Assessment / Reassessment X - Simple Wound Assessment / Reassessment - one wound 1 5 []  - 0 Complex Wound Assessment / Reassessment - multiple wounds []  - 0 Dermatologic / Skin Assessment (not related to wound area) ASSESSMENTS - Focused Assessment []  - Circumferential Edema Measurements - multi extremities 0 []  - 0 Nutritional Assessment / Counseling / Intervention []  - 0 Lower Extremity Assessment (monofilament, tuning fork, pulses) []  - 0 Peripheral Arterial Disease Assessment (using hand held doppler) ASSESSMENTS - Ostomy and/or Continence Assessment and Care []  - Incontinence Assessment and Management 0 []  - 0 Ostomy Care Assessment and Management (repouching, etc.) PROCESS - Coordination of Care X - Simple Patient / Family Education for ongoing care 1 15 []  - 0 Complex (extensive) Patient / Family Education for ongoing care []  - 0 Staff obtains Programmer, systems, Records, Test Results / Process Orders []  - 0 Staff telephones HHA, Nursing Homes / Clarify orders / etc []  - 0 Routine Transfer to another Facility (non-emergent condition) []  - 0 Routine Hospital Admission (non-emergent condition) []  - 0 New Admissions / Biomedical engineer / Ordering NPWT, Apligraf, etc. []  - 0 Emergency Hospital Admission (emergent condition) X- 1 10 Simple Discharge Coordination [name], [name] ([id number]) []  - 0 Complex (extensive) Discharge Coordination PROCESS - Special Needs []  - Pediatric / Minor Patient Management 0 []  - 0 Isolation Patient Management []  - 0 Hearing / Language / Visual special needs []  - 0 Assessment of Community assistance (transportation, D/C planning, etc.) []  - 0 Additional assistance / Altered mentation []  - 0 Support Surface(s) Assessment (bed,  cushion, seat, etc.) INTERVENTIONS - Wound Cleansing / Measurement X - Simple Wound Cleansing - one wound 1 5 []  - 0 Complex Wound Cleansing - multiple wounds X- 1 5 Wound Imaging (  photographs - any number of wounds) []  - 0 Wound Tracing (instead of photographs) X- 1 5 Simple Wound Measurement - one wound []  - 0 Complex Wound Measurement - multiple wounds INTERVENTIONS - Wound Dressings []  - Small Wound Dressing one or multiple wounds 0 X- 1 15 Medium Wound Dressing one or multiple wounds []  - 0 Large Wound Dressing one or multiple wounds []  - 0 Application of Medications - topical []  - 0 Application of Medications - injection INTERVENTIONS - Miscellaneous []  - External ear exam 0 []  - 0 Specimen Collection (cultures, biopsies, blood, body fluids, etc.) []  - 0 Specimen(s) / Culture(s) sent or taken to Lab for analysis []  - 0 Patient Transfer (multiple staff / Civil Service fast streamer / Similar devices) []  - 0 Simple Staple / Suture removal (25 or less) []  - 0 Complex Staple / Suture removal (26 or more) []  - 0 Hypo / Hyperglycemic Management (close monitor of Blood Glucose) []  - 0 Ankle / Brachial Index (ABI) - do not check if billed separately X- 1 5 Vital Signs [name], [name] ([id number]) Has the patient been seen at the hospital within the last three years: Yes Total Score: 80 Level Of Care: New/Established - Level 3 Electronic Signature(s) Signed: 07/18/2019 8:11:06 AM By: Army Melia Entered By: Army Melia on 07/07/2019 14:18:28 [name] ([id number]) -------------------------------------------------------------------------------- Encounter Discharge Information Details Patient Name: [name] Date of Service: 07/07/2019 1:30 PM Medical Record Number: [id number] Patient Account Number: [id number] Date of Birth/Sex: 1966-01-27 (53 y.o. M) Treating RN: Army Melia Primary Care Selmer Adduci: Johny Drilling Other Clinician: Referring Layaan Mott: Johny Drilling Treating  Cielo Arias/Extender: Melburn Hake, HOYT Weeks in Treatment: 3 Encounter Discharge Information Items Discharge Condition: Stable Ambulatory Status: Ambulatory Discharge Destination: Home Transportation: Private Auto Accompanied By: freind Schedule Follow-up Appointment: Yes Clinical Summary of Care: Electronic Signature(s) Signed: 07/18/2019 8:11:06 AM By: Army Melia Entered By: Army Melia on 07/07/2019 14:20:19 [name] ([id number]) -------------------------------------------------------------------------------- Lower Extremity Assessment Details Patient Name: [name] Date of Service: 07/07/2019 1:30 PM Medical Record Number: [id number] Patient Account Number: [id number] Date of Birth/Sex: Jul 31, 1966 (52 y.o. M) Treating RN: Montey Hora Primary Care Wileen Duncanson: Johny Drilling Other Clinician: Referring Tymia Streb: Johny Drilling Treating Jari Carollo/Extender: Melburn Hake, HOYT Weeks in Treatment: 3 Edema Assessment Assessed: [Left: No] [Right: No] Edema: [Left: N] [Right: o] Vascular Assessment Pulses: Dorsalis Pedis Palpable: [Left:Yes] Electronic Signature(s) Signed: 07/07/2019 4:46:01 PM By: Montey Hora Entered By: Montey Hora on 07/07/2019 14:02:47 [name] ([id number]) -------------------------------------------------------------------------------- Multi Wound Chart Details Patient Name: [name] Date of Service: 07/07/2019 1:30 PM Medical Record Number: [id number] Patient Account Number: [id number] Date of Birth/Sex: Jul 26, 1966 (53 y.o. M) Treating RN: Army Melia Primary Care Lowry Bala: Johny Drilling Other Clinician: Referring Mikeya Tomasetti: Johny Drilling Treating Johnathen Testa/Extender: Melburn Hake, HOYT Weeks in Treatment: 3 Vital Signs Height(in): 75 Pulse(bpm): 79 Weight(lbs): 240 Blood Pressure(mmHg): 172/88 Body Mass Index(BMI): 30 Temperature(F): 99.0 Respiratory Rate 16 (breaths/min): Photos: [N/A:N/A] Wound Location: Left Toe Second N/A  N/A Wounding Event: Gradually Appeared N/A N/A Primary Etiology: Diabetic Wound/Ulcer of the N/A N/A Lower Extremity Comorbid History: Cataracts, Hypertension, Type N/A N/A II Diabetes Date Acquired: 05/30/2019 N/A N/A Weeks of Treatment: 3 N/A N/A Wound Status: Open N/A N/A Pending Amputation on Yes N/A N/A Presentation: Measurements L x W x D 0.7x0.7x0.6 N/A N/A (cm) Area (cm) : 0.385 N/A N/A Volume (cm) : 0.231 N/A N/A % Reduction in Area: 85.40% N/A N/A % Reduction in Volume: 89.10% N/A N/A Classification: Grade 3 N/A N/A Exudate Amount: Medium N/A N/A  Exudate Type: Serosanguineous N/A N/A Exudate Color: red, brown N/A N/A Wound Margin: Distinct, outline attached N/A N/A Granulation Amount: Large (67-100%) N/A N/A Granulation Quality: Red N/A N/A Necrotic Amount: Small (1-33%) N/A N/A Exposed Structures: Fat Layer (Subcutaneous N/A N/A Tissue) Exposed: Yes Fascia: No Tendon: No [name], [name] ([id number]) Muscle: No Joint: No Bone: No Epithelialization: None N/A N/A Treatment Notes Electronic Signature(s) Signed: 07/18/2019 8:11:06 AM By: Army Melia Entered By: Army Melia on 07/07/2019 14:12:28 [name] ([id number]) -------------------------------------------------------------------------------- Multi-Disciplinary Care Plan Details Patient Name: [name] Date of Service: 07/07/2019 1:30 PM Medical Record Number: [id number] Patient Account Number: [id number] Date of Birth/Sex: 07-02-1966 (53 y.o. M) Treating RN: Army Melia Primary Care Odessa Nishi: Johny Drilling Other Clinician: Referring Jaya Lapka: Johny Drilling Treating Simrah Chatham/Extender: Melburn Hake, HOYT Weeks in Treatment: 3 Active Inactive Abuse / Safety / Falls / Self Care Management Nursing Diagnoses: Potential for falls Goals: Patient will not experience any injury related to falls Date Initiated: 06/14/2019 Target Resolution Date: 09/03/2019 Goal Status: Active Interventions: Assess fall risk  on admission and as needed Notes: Necrotic Tissue Nursing Diagnoses: Knowledge deficit related to management of necrotic/devitalized tissue Goals: Patient/caregiver will verbalize understanding of reason and process for debridement of necrotic tissue Date Initiated: 06/14/2019 Target Resolution Date: 09/03/2019 Goal Status: Active Interventions: Assess patient pain level pre-, during and post procedure and prior to discharge Notes: Orientation to the Wound Care Program Nursing Diagnoses: Knowledge deficit related to the wound healing center program Goals: Patient/caregiver will verbalize understanding of the [place] Date Initiated: 06/14/2019 Target Resolution Date: 09/03/2019 Goal Status: Active Interventions: Provide education on orientation to the wound center [name], [name] ([id number]) Notes: Peripheral Neuropathy Nursing Diagnoses: Potential alteration in peripheral tissue perfusion (select prior to confirmation of diagnosis) Goals: Patient/caregiver will verbalize understanding of disease process and disease management Date Initiated: 06/14/2019 Target Resolution Date: 09/03/2019 Goal Status: Active Interventions: Assess signs and symptoms of neuropathy upon admission and as needed Notes: Wound/Skin Impairment Nursing Diagnoses: Impaired tissue integrity Goals: Ulcer/skin breakdown will heal within 14 weeks Date Initiated: 06/14/2019 Target Resolution Date: 09/03/2019 Goal Status: Active Interventions: Assess patient/caregiver ability to obtain necessary supplies Assess patient/caregiver ability to perform ulcer/skin care regimen upon admission and as needed Assess ulceration(s) every visit Notes: Electronic Signature(s) Signed: 07/18/2019 8:11:06 AM By: Army Melia Entered By: Army Melia on 07/07/2019 14:12:19 [name] ([id number]) -------------------------------------------------------------------------------- Pain Assessment  Details Patient Name: [name] Date of Service: 07/07/2019 1:30 PM Medical Record Number: [id number] Patient Account Number: [id number] Date of Birth/Sex: 04-21-1966 (53 y.o. M) Treating RN: Army Melia Primary Care Kimyatta Lecy: Johny Drilling Other Clinician: Referring Kattia Selley: Johny Drilling Treating Mckala Pantaleon/Extender: Melburn Hake, HOYT Weeks in Treatment: 3 Active Problems Location of Pain Severity and Description of Pain Patient Has Paino No Site Locations Pain Management and Medication Current Pain Management: Electronic Signature(s) Signed: 07/07/2019 4:27:49 PM By: Lorine Bears RCP, RRT, CHT Signed: 07/18/2019 8:11:06 AM By: Army Melia Entered By: Lorine Bears on 07/07/2019 13:47:02 [name] ([id number]) -------------------------------------------------------------------------------- Patient/Caregiver Education Details Patient Name: [name] Date of Service: 07/07/2019 1:30 PM Medical Record Number: [id number] Patient Account Number: [id number] Date of Birth/Gender: 1966/04/06 (53 y.o. M) Treating RN: Army Melia Primary Care Physician: Johny Drilling Other Clinician: Referring Physician: Johny Drilling Treating Physician/Extender: Sharalyn Ink in Treatment: 3 Education Assessment Education Provided To: Patient Education Topics Provided Wound/Skin Impairment: Handouts: Caring for Your Ulcer Methods: Demonstration, Explain/Verbal Responses: State content correctly Electronic Signature(s) Signed: 07/18/2019 8:11:06 AM By: Army Melia Entered  By: Army Melia on 07/07/2019 14:19:30 [name] ([id number]) -------------------------------------------------------------------------------- Wound Assessment Details Patient Name: [name], [name] Date of Service: 07/07/2019 1:30 PM Medical Record Number: [id number] Patient Account Number: [id number] Date of Birth/Sex: 1966/04/01 (53 y.o. M) Treating RN: Montey Hora Primary Care  Andrika Peraza: Johny Drilling Other Clinician: Referring Raymonde Hamblin: Johny Drilling Treating Emina Ribaudo/Extender: Melburn Hake, HOYT Weeks in Treatment: 3 Wound Status Wound Number: 2 Primary Etiology: Diabetic Wound/Ulcer of the Lower Extremity Wound Location: Left Toe Second Wound Status: Open Wounding Event: Gradually Appeared Comorbid Cataracts, Hypertension, Type II Diabetes Date Acquired: 05/30/2019 History: Weeks Of Treatment: 3 Clustered Wound: No Pending Amputation On Presentation Photos Wound Measurements Length: (cm) 0.7 % Reduction Width: (cm) 0.7 % Reduction Depth: (cm) 0.6 Epithelializ Area: (cm) 0.385 Tunneling: Volume: (cm) 0.231 Undermining in Area: 85.4% in Volume: 89.1% ation: None No : No Wound Description Classification: Grade 3 Foul Odor A Wound Margin: Distinct, outline attached Slough/Fibr Exudate Amount: Medium Exudate Type: Serosanguineous Exudate Color: red, brown fter Cleansing: No ino Yes Wound Bed Granulation Amount: Large (67-100%) Exposed Structure Granulation Quality: Red Fascia Exposed: No Necrotic Amount: Small (1-33%) Fat Layer (Subcutaneous Tissue) Exposed: Yes Necrotic Quality: Adherent Slough Tendon Exposed: No Muscle Exposed: No Joint Exposed: No Bone Exposed: No Treatment Notes [name], [name] ([id number]) Wound #2 (Left Toe Second) Notes pt own calcium alginate packing, conform and surgical shoes for both feet Electronic Signature(s) Signed: 07/07/2019 4:46:01 PM By: Montey Hora Entered By: Montey Hora on 07/07/2019 14:05:02 [name] ([id number]) -------------------------------------------------------------------------------- Vitals Details Patient Name: [name] Date of Service: 07/07/2019 1:30 PM Medical Record Number: [id number] Patient Account Number: [id number] Date of Birth/Sex: June 26, 1966 (53 y.o. M) Treating RN: Army Melia Primary Care Rickayla Wieland: Johny Drilling Other Clinician: Referring Carles Florea: Johny Drilling Treating Breck Hollinger/Extender: Melburn Hake, HOYT Weeks in Treatment: 3 Vital Signs Time Taken: 13:47 Temperature (F): 99.0 Height (in): 75 Pulse (bpm): 79 Weight (lbs): 240 Respiratory Rate (breaths/min): 16 Body Mass Index (BMI): 30 Blood Pressure (mmHg): 172/88 Reference Range: 80 - 120 mg / dl Electronic Signature(s) Signed: 07/07/2019 4:27:49 PM By: Lorine Bears RCP, RRT, CHT Entered By: Lorine Bears on 07/07/2019 13:48:09

## 2019-07-21 ENCOUNTER — Other Ambulatory Visit: Payer: Self-pay

## 2019-07-21 ENCOUNTER — Encounter: Payer: BC Managed Care – PPO | Admitting: Physician Assistant

## 2019-07-21 DIAGNOSIS — E11621 Type 2 diabetes mellitus with foot ulcer: Secondary | ICD-10-CM | POA: Diagnosis not present

## 2019-07-21 NOTE — Progress Notes (Addendum)
DENYM, MIDDAUGH ([id number]) Visit Report for 07/21/2019 Chief Complaint Document Details Patient Name: [name], [name] Date of Service: 07/21/2019 1:30 PM Medical Record Number: [id number] Patient Account Number: [id number] Date of Birth/Sex: Dec 06, 1965 (53 y.o. M) Treating RN: Army Melia Primary Care Provider: Johny Drilling Other Clinician: Referring Provider: Johny Drilling Treating Provider/Extender: Melburn Hake, HOYT Weeks in Treatment: 5 Information Obtained from: Patient Chief Complaint Bilateral foot ulcers Electronic Signature(s) Signed: 07/21/2019 2:22:03 PM By: Worthy Keeler PA-C Entered By: Worthy Keeler on 07/21/2019 14:22:02 [name] ([id number]) -------------------------------------------------------------------------------- HPI Details Patient Name: [name] Date of Service: 07/21/2019 1:30 PM Medical Record Number: [id number] Patient Account Number: [id number] Date of Birth/Sex: September 11, 1966 (52 y.o. M) Treating RN: Army Melia Primary Care Provider: Johny Drilling Other Clinician: Referring Provider: Johny Drilling Treating Provider/Extender: Melburn Hake, HOYT Weeks in Treatment: 5 History of Present Illness HPI Description: 06/14/2019 on evaluation today patient appears to be doing somewhat poorly in regard to his bilateral feet. 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 He could not give me an exact time. With that being said he currently has an issue with his right great toe on the dorsal surface where he has an open wound. On the distal end of his left second toe he has an open wound with bone exposed and the bone does appear to be necrotic in my opinion it does not appear to be as firm as should be. With that being said he also has an x-ray which was taken on the visit to the ER this past Sunday, 06/12/2019 on that x-ray it  appears that he has a comminuted fracture of the left great toe and a distal fracture of the left second toe. Again my suspicion is may be secondary to issues with osteomyelitis he has not had any injury to the area. 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 He does have a history of diabetes mellitus type 2, peripheral neuropathy, again a right fifth toe amputation, and hypertension. 06/21/2019 on evaluation today patient appears to be doing somewhat better with regard to his toe ulcer compared to last evaluation. He did grow 2 organisms that were cultured out which was revealed on the culture I reviewed today. Enterobacter was 1 of those and then the second with Pseudomonas. The good news is Cipro will help for both he has been taking Bactrim as well as Augmentin currently I do not think he needs to continue taking both of these I think we can switch to Cipro and cover pretty much everything that we need to this is good news. Fortunately there is no signs of systemic infection and overall I do feel like his toe ulcer is better he still has some necrotic bone on the surface that is noted as well as some hyper granular tissue and again necrotic tissue on the distal portion of the toe but again I think that cleaning this away we can hopefully get this area to heal up and not end up with another amputation that is what he is wanting to try to avoid at this time. 06/30/2019 on evaluation today patient actually appears to be doing quite well with regard to his toe ulcer all things considering. He did have an MRI which I reviewed that was completed and it did show that he has a  comminuted fracture at the tip of the toe again this is likely due to the fact that we did obtain a sample of the bone which was necrotic and showed positive pathology for acute osteomyelitis. With that being said he also has evidence of potential septic arthritis of  the DIP joint. All that was noted on MRI. With that being said his culture did show evidence of Pseudomonas and Enterobacter. He is on the Cipro which seems to be doing well they were actually pleased with this to be honest. He has not heard from infectious disease as of yet. In regard to the EKG that we wanted him to have done he was actually somewhat concerned about this and really wants to see his primary care provider to see what he thinks about the hyperbarics in regard to the EKG or if the patient possibly needs to see a cardiologist before considering hyperbarics. Again I completely understand his concern in this regard I have no issues with that and my suggestion at this time would be that he go ahead and proceed with getting all that settled and clearing his mind before we consider the hyperbarics. 07/07/2019 on evaluation today patient seems to be doing well with regard to his toe ulcer all things considering. With that being said he has not been without his antibiotics until just this morning he did not have a dose he does need a refill of this. With that being said he also states that he unfortunately has been still somewhat concerned about the hyperbarics nonetheless again I cannot argue with the fact that his foot seems to be doing much better in regard to the toe ulcer. Overall we have gotten the necrotic bone off by way of debridement when I sent the pathology and culture results and subsequently has been on the Cipro which seems to have done well for him as well. I cannot argue with the results in regard to this. With that being said I did refer him to infectious disease I have not seen or heard as to why he has not been contacted will need to follow-up on this. 07/21/2019 on evaluation today patient appears to actually be doing somewhat better at this point with regard to his toe ulcer. This is showing signs of improvement. He still does not really not at the point of wanting to  proceed with hyperbarics since things seem to be improving that is good news is not having near as much drainage as he was in the beginning. I can still have 1 small area that probes down to bone but this is a small roughly 1 mm opening to that area the rest is covered with good granulation tissue and overall I feel like he is doing quite well. AVANTE, WITTKOPP ([id number]) Electronic Signature(s) Signed: 07/21/2019 2:31:52 PM By: Worthy Keeler PA-C Entered By: Worthy Keeler on 07/21/2019 14:31:52 DONIS, MCAULEY ([id number]) -------------------------------------------------------------------------------- Physical Exam Details Patient Name: [name] Date of Service: 07/21/2019 1:30 PM Medical Record Number: [id number] Patient Account Number: [id number] Date of Birth/Sex: 03/13/1966 (53 y.o. M) Treating RN: Army Melia Primary Care Provider: Johny Drilling Other Clinician: Referring Provider: Johny Drilling Treating Provider/Extender: Melburn Hake, HOYT Weeks in Treatment: 5 Constitutional Well-nourished and well-hydrated in no acute distress. Respiratory normal breathing without difficulty. clear to auscultation bilaterally. Cardiovascular regular rate and rhythm with normal S1, S2. Psychiatric this patient is able to make decisions and demonstrates good insight into disease process. Alert and Oriented x 3. pleasant and cooperative. Notes  His wound bed currently showed signs of good granulation at this time. Fortunately there is no signs of active infection and the patient has been tolerating the dressing changes without complication. I think at this point we can switch to a collagen based dressing which will be appropriate and again I do not really feel like he is going to require the alginate at this point. Electronic Signature(s) Signed: 07/21/2019 2:32:20 PM By: Worthy Keeler PA-C Entered By: Worthy Keeler on 07/21/2019 14:32:19 [name]  ([id number]) -------------------------------------------------------------------------------- Physician Orders Details Patient Name: [name] Date of Service: 07/21/2019 1:30 PM Medical Record Number: [id number] Patient Account Number: [id number] Date of Birth/Sex: 04/28/1966 (53 y.o. M) Treating RN: Army Melia Primary Care Provider: Johny Drilling Other Clinician: Referring Provider: Johny Drilling Treating Provider/Extender: Melburn Hake, HOYT Weeks in Treatment: 5 Verbal / Phone Orders: No Diagnosis Coding ICD-10 Coding Code Description E11.621 Type 2 diabetes mellitus with foot ulcer L97.512 Non-pressure chronic ulcer of other part of right foot with fat layer exposed M86.672 Other chronic osteomyelitis, left ankle and foot L97.522 Non-pressure chronic ulcer of other part of left foot with fat layer exposed Z89.421 Acquired absence of other right toe(s) I10 Essential (primary) hypertension Wound Cleansing Wound #2 Left Toe Second o Dial antibacterial soap, wash wounds, rinse and pat dry prior to dressing wounds o May Shower, gently pat wound dry prior to applying new dressing. Primary Wound Dressing Wound #2 Left Toe Second o Silver Collagen Secondary Dressing Wound #2 Left Toe Second o Conform/Kerlix Dressing Change Frequency Wound #2 Left Toe Second o Change dressing every day. Follow-up Appointments o Return Appointment in 1 week. Off-Loading Wound #2 Left Toe Second o Open toe surgical shoe to: - both feet Hyperbaric Oxygen Therapy Wound #2 Left Toe Second o Evaluate for HBO Therapy o Indication: - chronic refractory osteomyelitis o If appropriate for treatment, begin HBOT per protocol: o 2.0 ATA for 90 Minutes without Air Breaks o One treatment per day (delivered Monday through Friday unless otherwise specified in Special Instructions below): GARDINER, SYDOW ([id number]) o Total # of Treatments: - 40 o Finger stick Blood Glucose Pre-  and Post- HBOT Treatment. o Follow Hyperbaric Oxygen Glycemia Protocol Patient Medications Allergies: No Known Allergies Notifications Medication Indication Start End Cipro 07/21/2019 DOSE 1 - oral 500 mg tablet - 1 tablet oral taken 2 times a day for 15 days GLYCEMIA INTERVENTIONS PROTOCOL PRE-HBO GLYCEMIA INTERVENTIONS ACTION INTERVENTION Obtain pre-HBO capillary blood glucose 1 (ensure physician order is in chart). A. Notify HBO physician and await physician orders. 2 If result is 70 mg/dl or below: B. If the result meets the hospital definition of a critical result, follow hospital policy. A. Give patient an 8 ounce Glucerna Shake, an 8 ounce Ensure, or 8 ounces of a Glucerna/Ensure equivalent dietary supplement*. B. Wait 30 minutes. C. Retest patientos capillary blood If result is 71 mg/dl to 130 mg/dl: glucose (CBG). D. If result greater than or equal to 110 mg/dl, proceed with HBO. If result less than 110 mg/dl, notify HBO physician and consider holding HBO. If result is 131 mg/dl to 249 mg/dl: A. Proceed with HBO. A. Notify HBO physician and await physician orders. B. It is recommended to hold HBO and If result is 250 mg/dl or greater: do blood/urine ketone testing. C. If the result meets the hospital definition of a critical result, follow hospital policy. POST-HBO GLYCEMIA INTERVENTIONS ACTION INTERVENTION Obtain post HBO capillary blood glucose 1 (ensure physician order is in chart). A. Notify  HBO physician and await physician orders. 2 If result is 70 mg/dl or below: B. If the result meets the hospital definition of a critical result, follow hospital policy. If result is 71 mg/dl to 100 mg/dl: A. Give patient an 8 ounce Glucerna Shake, an 8 ounce Ensure, or 8 ounces of a Glucerna/Ensure equivalent dietary supplement*. FAHIM, FAIRCLOUGH ([id number]) B. Wait 15 minutes for symptoms of hypoglycemia (i.e. nervousness, anxiety, sweating,  chills, clamminess, irritability, confusion, tachycardia or dizziness). C. If patient asymptomatic, discharge patient. If patient symptomatic, repeat capillary blood glucose (CBG) and notify HBO physician. If result is 101 mg/dl to 249 mg/dl: A. Discharge patient. A. Notify HBO physician and await physician orders. B. It is recommended to do blood/urine If result is 250 mg/dl or greater: ketone testing. C. If the result meets the hospital definition of a critical result, follow hospital policy. *Juice or candies are NOT equivalent products. If patient refuses the Glucerna or Ensure, please consult the hospital dietitian for an appropriate substitute. Electronic Signature(s) Signed: 07/21/2019 2:33:58 PM By: Worthy Keeler PA-C Entered By: Worthy Keeler on 07/21/2019 14:33:57 [name] ([id number]) -------------------------------------------------------------------------------- Problem List Details Patient Name: [name] Date of Service: 07/21/2019 1:30 PM Medical Record Number: [id number] Patient Account Number: [id number] Date of Birth/Sex: 1966-07-03 (53 y.o. M) Treating RN: Army Melia Primary Care Provider: Johny Drilling Other Clinician: Referring Provider: Johny Drilling Treating Provider/Extender: Melburn Hake, HOYT Weeks in Treatment: 5 Active Problems ICD-10 Evaluated Encounter Code Description Active Date Today Diagnosis E11.621 Type 2 diabetes mellitus with foot ulcer 06/14/2019 No Yes L97.512 Non-pressure chronic ulcer of other part of right foot with fat 06/14/2019 No Yes layer exposed M86.672 Other chronic osteomyelitis, left ankle and foot 06/21/2019 No Yes L97.522 Non-pressure chronic ulcer of other part of left foot with fat 06/14/2019 No Yes layer exposed Z89.421 Acquired absence of other right toe(s) 06/14/2019 No Yes I10 Essential (primary) hypertension 06/14/2019 No Yes Inactive Problems Resolved Problems Electronic Signature(s) Signed: 07/21/2019  2:21:55 PM By: Worthy Keeler PA-C Entered By: Worthy Keeler on 07/21/2019 14:21:54 [name] ([id number]) -------------------------------------------------------------------------------- Progress Note Details Patient Name: [name] Date of Service: 07/21/2019 1:30 PM Medical Record Number: [id number] Patient Account Number: [id number] Date of Birth/Sex: 09/05/1966 (53 y.o. M) Treating RN: Army Melia Primary Care Provider: Johny Drilling Other Clinician: Referring Provider: Johny Drilling Treating Provider/Extender: Melburn Hake, HOYT Weeks in Treatment: 5 Subjective Chief Complaint Information obtained from Patient Bilateral foot ulcers History of Present Illness (HPI) 06/14/2019 on evaluation today patient appears to be doing somewhat poorly in regard to his bilateral feet. 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 He could not give me an exact time. With that being said he currently has an issue with his right great toe on the dorsal surface where he has an open wound. On the distal end of his left second toe he has an open wound with bone exposed and the bone does appear to be necrotic in my opinion it does not appear to be as firm as should be. With that being said he also has an x-ray which was taken on the visit to the ER this past Sunday, 06/12/2019 on that x-ray it appears that he has a comminuted fracture of the left great toe and a distal fracture of the left second toe. Again my suspicion is may be secondary to  issues with osteomyelitis he has not had any injury to the area. 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 He does have a history of diabetes mellitus type 2, peripheral neuropathy, again a right fifth toe amputation, and hypertension. 06/21/2019 on evaluation  today patient appears to be doing somewhat better with regard to his toe ulcer compared to last evaluation. He did grow 2 organisms that were cultured out which was revealed on the culture I reviewed today. Enterobacter was 1 of those and then the second with Pseudomonas. The good news is Cipro will help for both he has been taking Bactrim as well as Augmentin currently I do not think he needs to continue taking both of these I think we can switch to Cipro and cover pretty much everything that we need to this is good news. Fortunately there is no signs of systemic infection and overall I do feel like his toe ulcer is better he still has some necrotic bone on the surface that is noted as well as some hyper granular tissue and again necrotic tissue on the distal portion of the toe but again I think that cleaning this away we can hopefully get this area to heal up and not end up with another amputation that is what he is wanting to try to avoid at this time. 06/30/2019 on evaluation today patient actually appears to be doing quite well with regard to his toe ulcer all things considering. He did have an MRI which I reviewed that was completed and it did show that he has a comminuted fracture at the tip of the toe again this is likely due to the fact that we did obtain a sample of the bone which was necrotic and showed positive pathology for acute osteomyelitis. With that being said he also has evidence of potential septic arthritis of the DIP joint. All that was noted on MRI. With that being said his culture did show evidence of Pseudomonas and Enterobacter. He is on the Cipro which seems to be doing well they were actually pleased with this to be honest. He has not heard from infectious disease as of yet. In regard to the EKG that we wanted him to have done he was actually somewhat concerned about this and really wants to see his primary care provider to see what he thinks about the hyperbarics in regard  to the EKG or if the patient possibly needs to see a cardiologist before considering hyperbarics. Again I completely understand his concern in this regard I have no issues with that and my suggestion at this time would be that he go ahead and proceed with getting all that settled and clearing his mind before we consider the hyperbarics. 07/07/2019 on evaluation today patient seems to be doing well with regard to his toe ulcer all things considering. With that being said he has not been without his antibiotics until just this morning he did not have a dose he does need a refill of this. With that being said he also states that he unfortunately has been still somewhat concerned about the hyperbarics nonetheless again I cannot argue with the fact that his foot seems to be doing much better in regard to the toe ulcer. Overall we have gotten the necrotic bone off by way of debridement when I sent the pathology and culture results and subsequently has been on the Cipro which seems to have done well for him as well.  I cannot argue with the results in regard to this. With that being said I did refer him to infectious disease I have not seen or heard as to why he has not been contacted will need to follow-up on this. LANDON, NOBOA ([id number]) 07/21/2019 on evaluation today patient appears to actually be doing somewhat better at this point with regard to his toe ulcer. This is showing signs of improvement. He still does not really not at the point of wanting to proceed with hyperbarics since things seem to be improving that is good news is not having near as much drainage as he was in the beginning. I can still have 1 small area that probes down to bone but this is a small roughly 1 mm opening to that area the rest is covered with good granulation tissue and overall I feel like he is doing quite well. Patient History Information obtained from Patient. Family History Diabetes - Father, Hypertension - Father,  Stroke - Siblings, No family history of Cancer, Heart Disease, Kidney Disease, Lung Disease, Seizures, Thyroid Problems, Tuberculosis. Social History Current every day smoker, Marital Status - Single, Alcohol Use - Daily, Drug Use - No History, Caffeine Use - Moderate. Medical History Eyes Patient has history of Cataracts Denies history of Glaucoma, Optic Neuritis Ear/Nose/Mouth/Throat Denies history of Chronic sinus problems/congestion, Middle ear problems Hematologic/Lymphatic Denies history of Anemia, Hemophilia, Human Immunodeficiency Virus, Lymphedema, Sickle Cell Disease Respiratory Denies history of Aspiration, Asthma, Chronic Obstructive Pulmonary Disease (COPD), Pneumothorax, Sleep Apnea, Tuberculosis Cardiovascular Patient has history of Hypertension Denies history of Angina, Arrhythmia, Congestive Heart Failure, Coronary Artery Disease, Deep Vein Thrombosis, Hypotension, Myocardial Infarction, Peripheral Arterial Disease, Peripheral Venous Disease, Phlebitis, Vasculitis Gastrointestinal Denies history of Cirrhosis , Colitis, Crohn s, Hepatitis A, Hepatitis B, Hepatitis C Endocrine Patient has history of Type II Diabetes Genitourinary Denies history of End Stage Renal Disease Immunological Denies history of Lupus Erythematosus, Raynaud s, Scleroderma Integumentary (Skin) Denies history of History of Burn, History of pressure wounds Musculoskeletal Denies history of Gout, Rheumatoid Arthritis, Osteoarthritis, Osteomyelitis Neurologic Denies history of Dementia, Neuropathy, Quadriplegia, Paraplegia, Seizure Disorder Oncologic Denies history of Received Chemotherapy, Received Radiation Psychiatric Denies history of Anorexia/bulimia, Confinement Anxiety Hospitalization/Surgery History - [facility] for amputation. Review of Systems (ROS) Constitutional Symptoms (General Health) Denies complaints or symptoms of Fatigue, Fever, Chills, Marked Weight Change. Respiratory Denies  complaints or symptoms of Chronic or frequent coughs, Shortness of Breath. Cardiovascular Denies complaints or symptoms of Chest pain, LE edema. ROYALL, COLTON ([id number]) Psychiatric Denies complaints or symptoms of Anxiety, Claustrophobia. Objective Constitutional Well-nourished and well-hydrated in no acute distress. Vitals Time Taken: 1:35 PM, Height: 75 in, Weight: 240 lbs, BMI: 30, Temperature: 98.1 F, Pulse: 76 bpm, Respiratory Rate: 18 breaths/min, Blood Pressure: 160/88 mmHg. Respiratory normal breathing without difficulty. clear to auscultation bilaterally. Cardiovascular regular rate and rhythm with normal S1, S2. Psychiatric this patient is able to make decisions and demonstrates good insight into disease process. Alert and Oriented x 3. pleasant and cooperative. General Notes: His wound bed currently showed signs of good granulation at this time. Fortunately there is no signs of active infection and the patient has been tolerating the dressing changes without complication. I think at this point we can switch to a collagen based dressing which will be appropriate and again I do not really feel like he is going to require the alginate at this point. Integumentary (Hair, Skin) Wound #2 status is Open. Original cause of wound was Gradually Appeared. The wound is located  on the Left Toe Second. The wound measures 0.9cm length x 0.5cm width x 0.6cm depth; 0.353cm^2 area and 0.212cm^3 volume. There is Fat Layer (Subcutaneous Tissue) Exposed exposed. There is no tunneling or undermining noted. There is a medium amount of serosanguineous drainage noted. The wound margin is distinct with the outline attached to the wound base. There is large (67-100%) red granulation within the wound bed. There is a small (1-33%) amount of necrotic tissue within the wound bed including Adherent Slough. 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 Primary Wound Dressing: Wound #2 Left Toe Second: Silver Collagen Secondary Dressing: Wound #2 Left Toe Second: Conform/Kerlix Dressing Change Frequency: Wound #2 Left Toe Second: Change dressing every day. Follow-up Appointments: Return Appointment in 1 week. Off-Loading: Wound #2 Left Toe Second: Open toe surgical shoe to: - both feet Hyperbaric Oxygen Therapy: Wound #2 Left Toe Second: Evaluate for HBO Therapy Indication: - chronic refractory osteomyelitis If appropriate for treatment, begin HBOT per protocol: 2.0 ATA for 90 Minutes without Air Breaks One treatment per day (delivered Monday through Friday unless otherwise specified in Special Instructions below): Total # of Treatments: - 40 Finger stick Blood Glucose Pre- and Post- HBOT Treatment. Follow Hyperbaric Oxygen Glycemia Protocol The following medication(s) was prescribed: Cipro oral 500 mg tablet 1 1 tablet oral taken 2 times a day for 15 days starting 07/21/2019 1 I would recommend that we go ahead and initiate treatment with silver collagen. 2. We will cover this with a gauze wrap to secure with tape and then subsequently have him change this every 2 days. 3. I would recommend that he still continue with the offloading/postop shoes which do seem to be helping as well. 4. I am going to send in a refill for the Cipro for the patient today as well for an additional 15 days this will be a total of 6 weeks of treatment at that point. He never did get  a call from infectious disease despite multiple faxes to them unfortunately. Nonetheless I think he is doing better and again he is about to complete the 6 weeks of treatment with Cipro which was probably appropriate for him anyway. We will see patient back for reevaluation in 2 weeks here in the clinic. If anything worsens or changes patient will contact our office for additional recommendations. Electronic Signature(s) Signed: 07/21/2019 2:34:13 PM By: Irean Hong [place], [place] ([id number]) Entered By: Worthy Keeler on 07/21/2019 14:34:12 SHARIEF, GIRVIN ([id number]) -------------------------------------------------------------------------------- ROS/PFSH Details Patient Name: [name] Date of Service: 07/21/2019 1:30 PM Medical Record Number: [id number] Patient Account Number: [id number] Date of Birth/Sex: [birth year] (53 y.o. M) Treating RN: Army Melia Primary Care Provider: Johny Drilling Other Clinician: Referring Provider: Johny Drilling Treating Provider/Extender: Melburn Hake, HOYT Weeks in Treatment: 5 Information Obtained From Patient Constitutional Symptoms (General Health) Complaints and Symptoms: Negative for: Fatigue; Fever; Chills; Marked Weight Change Respiratory Complaints and Symptoms: Negative for: Chronic or frequent coughs; Shortness of Breath Medical History: Negative for: Aspiration; Asthma; Chronic Obstructive Pulmonary Disease (COPD);  Pneumothorax; Sleep Apnea; Tuberculosis Cardiovascular Complaints and Symptoms: Negative for: Chest pain; LE edema Medical History: Positive for: Hypertension Negative for: Angina; Arrhythmia; Congestive Heart Failure; Coronary Artery Disease; Deep Vein Thrombosis; Hypotension; Myocardial Infarction; Peripheral Arterial Disease; Peripheral Venous Disease; Phlebitis; Vasculitis Psychiatric Complaints and Symptoms: Negative for: Anxiety; Claustrophobia Medical History: Negative for: Anorexia/bulimia; Confinement  Anxiety Eyes Medical History: Positive for: Cataracts Negative for: Glaucoma; Optic Neuritis Ear/Nose/Mouth/Throat Medical History: Negative for: Chronic sinus problems/congestion; Middle ear problems Hematologic/Lymphatic Medical History: Negative for: Anemia; Hemophilia; Human Immunodeficiency Virus; Lymphedema; Sickle Cell Disease KEAWE, FELLNER ([id number]) Gastrointestinal Medical History: Negative for: Cirrhosis ; Colitis; Crohnos; Hepatitis A; Hepatitis B; Hepatitis C Endocrine Medical History: Positive for: Type II Diabetes Treated with: Oral agents Genitourinary Medical History: Negative for: End Stage Renal Disease Immunological Medical History: Negative for: Lupus Erythematosus; Raynaudos; Scleroderma Integumentary (Skin) Medical History: Negative for: History of Burn; History of pressure wounds Musculoskeletal Medical History: Negative for: Gout; Rheumatoid Arthritis; Osteoarthritis; Osteomyelitis Neurologic Medical History: Negative for: Dementia; Neuropathy; Quadriplegia; Paraplegia; Seizure Disorder Oncologic Medical History: Negative for: Received Chemotherapy; Received Radiation HBO Extended History Items Eyes: Cataracts Immunizations Pneumococcal Vaccine: Received Pneumococcal Vaccination: No Implantable Devices None Hospitalization / Surgery History Type of Hospitalization/Surgery [facility] for amputation Family and Social History IMRI, DELGARDO ([id number]) Cancer: No; Diabetes: Yes - Father; Heart Disease: No; Hypertension: Yes - Father; Kidney Disease: No; Lung Disease: No; Seizures: No; Stroke: Yes - Siblings; Thyroid Problems: No; Tuberculosis: No; Current every day smoker; Marital Status - Single; Alcohol Use: Daily; Drug Use: No History; Caffeine Use: Moderate; Financial Concerns: No; Food, Clothing or Shelter Needs: No; Support System Lacking: No; Transportation Concerns: No Physician Affirmation I have reviewed and agree with the above  information. Electronic Signature(s) Signed: 07/21/2019 4:16:03 PM By: Army Melia Signed: 07/21/2019 6:36:47 PM By: Worthy Keeler PA-C Entered By: Worthy Keeler on 07/21/2019 14:32:07 [name] ([id number]) -------------------------------------------------------------------------------- SuperBill Details Patient Name: [name] Date of Service: 07/21/2019 Medical Record Number: [id number] Patient Account Number: [id number] Date of Birth/Sex: [birth year] (53 y.o. 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) I10 Essential (primary) hypertension Facility Procedures CPT4 Code: [id number] Description: 99213 - WOUND CARE VISIT-LEV 3 EST PT Modifier: Quantity: 1 Physician Procedures CPT4 Code: [id number] Description: 99214 - WC PHYS LEVEL 4 - EST PT ICD-10 Diagnosis Description E11.621 Type 2 diabetes mellitus with foot ulcer L97.512 Non-pressure chronic ulcer of other part of right foot with fat M86.672 Other chronic osteomyelitis, left ankle and foot  L97.522 Non-pressure chronic ulcer of other part of left foot with fat Modifier: layer exposed layer exposed Quantity: 1 Electronic Signature(s) Signed: 07/21/2019 2:34:23 PM By: Worthy Keeler PA-C Entered By: Worthy Keeler on 07/21/2019 14:34:23

## 2019-07-21 NOTE — Progress Notes (Signed)
BASSAM, SACCO ([id number]) Visit Report for 07/21/2019 Arrival Information Details Patient Name: [name], [name] Date of Service: 07/21/2019 1:30 PM Medical Record Number: [id number] Patient Account Number: [id number] Date of Birth/Sex: [birth year] (53 y.o. M) Treating RN: Army Melia Primary Care Kaleeyah Cuffie: Johny Drilling Other Clinician: Referring Genasis Zingale: Johny Drilling Treating Morgann Woodburn/Extender: Melburn Hake, HOYT Weeks in Treatment: 5 Visit Information History Since Last Visit Added or deleted any medications: No Patient Arrived: Ambulatory Any new allergies or adverse reactions: No Arrival Time: 13:37 Had a fall or experienced change in No Accompanied By: girlfriend activities of daily living that may affect Transfer Assistance: None risk of falls: Patient Identification Verified: Yes Signs or symptoms of abuse/neglect since last visito No Secondary Verification Process Completed: Yes Hospitalized since last visit: No Implantable device outside of the clinic excluding No cellular tissue based products placed in the center since last visit: Has Dressing in Place as Prescribed: Yes Pain Present Now: No Electronic Signature(s) Signed: 07/21/2019 4:02:16 PM By: Lorine Bears RCP, RRT, CHT Entered By: Lorine Bears on 07/21/2019 13:38:14 [name] ([id number]) -------------------------------------------------------------------------------- Clinic Level of Care Assessment Details Patient Name: [name] Date of Service: 07/21/2019 1:30 PM Medical Record Number: [id number] Patient Account Number: [id number] Date of Birth/Sex: 1966/02/20 (53 y.o. M) Treating RN: Army Melia Primary Care Margret Moat: Johny Drilling Other Clinician: Referring Epifanio Labrador: Johny Drilling Treating Douglas Smolinsky/Extender: Melburn Hake, HOYT Weeks in Treatment: 5 Clinic Level of Care Assessment Items TOOL 4 Quantity Score []  - Use when only an EandM is performed on FOLLOW-UP visit  0 ASSESSMENTS - Nursing Assessment / Reassessment X - Reassessment of Co-morbidities (includes updates in patient status) 1 10 X- 1 5 Reassessment of Adherence to Treatment Plan ASSESSMENTS - Wound and Skin Assessment / Reassessment X - Simple Wound Assessment / Reassessment - one wound 1 5 []  - 0 Complex Wound Assessment / Reassessment - multiple wounds []  - 0 Dermatologic / Skin Assessment (not related to wound area) ASSESSMENTS - Focused Assessment []  - Circumferential Edema Measurements - multi extremities 0 []  - 0 Nutritional Assessment / Counseling / Intervention []  - 0 Lower Extremity Assessment (monofilament, tuning fork, pulses) []  - 0 Peripheral Arterial Disease Assessment (using hand held doppler) ASSESSMENTS - Ostomy and/or Continence Assessment and Care []  - Incontinence Assessment and Management 0 []  - 0 Ostomy Care Assessment and Management (repouching, etc.) PROCESS - Coordination of Care X - Simple Patient / Family Education for ongoing care 1 15 []  - 0 Complex (extensive) Patient / Family Education for ongoing care X- 1 10 Staff obtains Programmer, systems, Records, Test Results / Process Orders []  - 0 Staff telephones HHA, Nursing Homes / Clarify orders / etc []  - 0 Routine Transfer to another Facility (non-emergent condition) []  - 0 Routine Hospital Admission (non-emergent condition) []  - 0 New Admissions / Biomedical engineer / Ordering NPWT, Apligraf, etc. []  - 0 Emergency Hospital Admission (emergent condition) X- 1 10 Simple Discharge Coordination [name], [name] ([id number]) []  - 0 Complex (extensive) Discharge Coordination PROCESS - Special Needs []  - Pediatric / Minor Patient Management 0 []  - 0 Isolation Patient Management []  - 0 Hearing / Language / Visual special needs []  - 0 Assessment of Community assistance (transportation, D/C planning, etc.) []  - 0 Additional assistance / Altered mentation []  - 0 Support Surface(s) Assessment (bed,  cushion, seat, etc.) INTERVENTIONS - Wound Cleansing / Measurement X - Simple Wound Cleansing - one wound 1 5 []  - 0 Complex Wound Cleansing - multiple wounds X- 1 5 Wound Imaging (  photographs - any number of wounds) []  - 0 Wound Tracing (instead of photographs) []  - 0 Simple Wound Measurement - one wound []  - 0 Complex Wound Measurement - multiple wounds INTERVENTIONS - Wound Dressings X - Small Wound Dressing one or multiple wounds 1 10 []  - 0 Medium Wound Dressing one or multiple wounds []  - 0 Large Wound Dressing one or multiple wounds []  - 0 Application of Medications - topical []  - 0 Application of Medications - injection INTERVENTIONS - Miscellaneous []  - External ear exam 0 []  - 0 Specimen Collection (cultures, biopsies, blood, body fluids, etc.) []  - 0 Specimen(s) / Culture(s) sent or taken to Lab for analysis []  - 0 Patient Transfer (multiple staff / Civil Service fast streamer / Similar devices) []  - 0 Simple Staple / Suture removal (25 or less) []  - 0 Complex Staple / Suture removal (26 or more) []  - 0 Hypo / Hyperglycemic Management (close monitor of Blood Glucose) []  - 0 Ankle / Brachial Index (ABI) - do not check if billed separately X- 1 5 Vital Signs [name], [name] ([id number]) Has the patient been seen at the hospital within the last three years: Yes Total Score: 80 Level Of Care: New/Established - Level 3 Electronic Signature(s) Signed: 07/21/2019 4:16:03 PM By: Army Melia Entered By: Army Melia on 07/21/2019 14:29:49 [name] ([id number]) -------------------------------------------------------------------------------- Encounter Discharge Information Details Patient Name: [name] Date of Service: 07/21/2019 1:30 PM Medical Record Number: [id number] Patient Account Number: [id number] Date of Birth/Sex: January 21, 1966 (53 y.o. M) Treating RN: Army Melia Primary Care Carely Nappier: Johny Drilling Other Clinician: Referring Rameen Quinney: Johny Drilling Treating  Dodge Ator/Extender: Melburn Hake, HOYT Weeks in Treatment: 5 Encounter Discharge Information Items Discharge Condition: Stable Ambulatory Status: Ambulatory Discharge Destination: Home Transportation: Private Auto Accompanied By: family Schedule Follow-up Appointment: Yes Clinical Summary of Care: Electronic Signature(s) Signed: 07/21/2019 4:16:03 PM By: Army Melia Entered By: Army Melia on 07/21/2019 14:30:34 [name] ([id number]) -------------------------------------------------------------------------------- Lower Extremity Assessment Details Patient Name: [name] Date of Service: 07/21/2019 1:30 PM Medical Record Number: [id number] Patient Account Number: [id number] Date of Birth/Sex: 10/16/1965 (53 y.o. M) Treating RN: Harold Barban Primary Care Anne-Marie Genson: Johny Drilling Other Clinician: Referring Cori Justus: Johny Drilling Treating Collyns Mcquigg/Extender: Melburn Hake, HOYT Weeks in Treatment: 5 Vascular Assessment Pulses: Dorsalis Pedis Palpable: [Left:Yes] Posterior Tibial Palpable: [Left:Yes] Electronic Signature(s) Signed: 07/21/2019 4:05:35 PM By: Harold Barban Entered By: Harold Barban on 07/21/2019 13:40:03 [name] ([id number]) -------------------------------------------------------------------------------- Multi Wound Chart Details Patient Name: [name] Date of Service: 07/21/2019 1:30 PM Medical Record Number: [id number] Patient Account Number: [id number] Date of Birth/Sex: Jul 07, 1966 (53 y.o. M) Treating RN: Army Melia Primary Care Mareon Robinette: Johny Drilling Other Clinician: Referring Hogan Hoobler: Johny Drilling Treating Bladen Umar/Extender: Melburn Hake, HOYT Weeks in Treatment: 5 Vital Signs Height(in): 75 Pulse(bpm): 76 Weight(lbs): 240 Blood Pressure(mmHg): 160/88 Body Mass Index(BMI): 30 Temperature(F): 98.1 Respiratory Rate 18 (breaths/min): Photos: [N/A:N/A] Wound Location: Left Toe Second N/A N/A Wounding Event: Gradually Appeared N/A  N/A Primary Etiology: Diabetic Wound/Ulcer of the N/A N/A Lower Extremity Comorbid History: Cataracts, Hypertension, Type N/A N/A II Diabetes Date Acquired: 05/30/2019 N/A N/A Weeks of Treatment: 5 N/A N/A Wound Status: Open N/A N/A Pending Amputation on Yes N/A N/A Presentation: Measurements L x W x D 0.9x0.5x0.6 N/A N/A (cm) Area (cm) : 0.353 N/A N/A Volume (cm) : 0.212 N/A N/A % Reduction in Area: 86.60% N/A N/A % Reduction in Volume: 90.00% N/A N/A Classification: Grade 3 N/A N/A Exudate Amount: Medium N/A N/A Exudate Type: Serosanguineous N/A N/A Exudate Color:  red, brown N/A N/A Wound Margin: Distinct, outline attached N/A N/A Granulation Amount: Large (67-100%) N/A N/A Granulation Quality: Red N/A N/A Necrotic Amount: Small (1-33%) N/A N/A Exposed Structures: Fat Layer (Subcutaneous N/A N/A Tissue) Exposed: Yes Fascia: No Tendon: No [name], [name] ([id number]) Muscle: No Joint: No Bone: No Epithelialization: None N/A N/A Treatment Notes Electronic Signature(s) Signed: 07/21/2019 4:16:03 PM By: Army Melia Entered By: Army Melia on 07/21/2019 14:23:34 [name] ([id number]) -------------------------------------------------------------------------------- Multi-Disciplinary Care Plan Details Patient Name: [name] Date of Service: 07/21/2019 1:30 PM Medical Record Number: [id number] Patient Account Number: [id number] Date of Birth/Sex: 1965-10-03 (53 y.o. M) Treating RN: Army Melia Primary Care Terriah Reggio: Johny Drilling Other Clinician: Referring Aerika Groll: Johny Drilling Treating Deniece Rankin/Extender: Melburn Hake, HOYT Weeks in Treatment: 5 Active Inactive Abuse / Safety / Falls / Self Care Management Nursing Diagnoses: Potential for falls Goals: Patient will not experience any injury related to falls Date Initiated: 06/14/2019 Target Resolution Date: 09/03/2019 Goal Status: Active Interventions: Assess fall risk on admission and as needed Notes: Necrotic  Tissue Nursing Diagnoses: Knowledge deficit related to management of necrotic/devitalized tissue Goals: Patient/caregiver will verbalize understanding of reason and process for debridement of necrotic tissue Date Initiated: 06/14/2019 Target Resolution Date: 09/03/2019 Goal Status: Active Interventions: Assess patient pain level pre-, during and post procedure and prior to discharge Notes: Orientation to the Wound Care Program Nursing Diagnoses: Knowledge deficit related to the wound healing center program Goals: Patient/caregiver will verbalize understanding of the [place] Date Initiated: 06/14/2019 Target Resolution Date: 09/03/2019 Goal Status: Active Interventions: Provide education on orientation to the wound center [name], [name] ([id number]) Notes: Peripheral Neuropathy Nursing Diagnoses: Potential alteration in peripheral tissue perfusion (select prior to confirmation of diagnosis) Goals: Patient/caregiver will verbalize understanding of disease process and disease management Date Initiated: 06/14/2019 Target Resolution Date: 09/03/2019 Goal Status: Active Interventions: Assess signs and symptoms of neuropathy upon admission and as needed Notes: Wound/Skin Impairment Nursing Diagnoses: Impaired tissue integrity Goals: Ulcer/skin breakdown will heal within 14 weeks Date Initiated: 06/14/2019 Target Resolution Date: 09/03/2019 Goal Status: Active Interventions: Assess patient/caregiver ability to obtain necessary supplies Assess patient/caregiver ability to perform ulcer/skin care regimen upon admission and as needed Assess ulceration(s) every visit Notes: Electronic Signature(s) Signed: 07/21/2019 4:16:03 PM By: Army Melia Entered By: Army Melia on 07/21/2019 14:23:19 [name] ([id number]) -------------------------------------------------------------------------------- Pain Assessment Details Patient Name: [name] Date of Service:  07/21/2019 1:30 PM Medical Record Number: [id number] Patient Account Number: [id number] Date of Birth/Sex: 06-16-1966 (53 y.o. M) Treating RN: Harold Barban Primary Care Veronika Heard: Johny Drilling Other Clinician: Referring Dominie Benedick: Johny Drilling Treating Shanayah Kaffenberger/Extender: Melburn Hake, HOYT Weeks in Treatment: 5 Active Problems Location of Pain Severity and Description of Pain Patient Has Paino No Site Locations Pain Management and Medication Current Pain Management: Electronic Signature(s) Signed: 07/21/2019 4:05:35 PM By: Harold Barban Entered By: Harold Barban on 07/21/2019 13:38:50 [name] ([id number]) -------------------------------------------------------------------------------- Patient/Caregiver Education Details Patient Name: [name] Date of Service: 07/21/2019 1:30 PM Medical Record Number: [id number] Patient Account Number: [id number] Date of Birth/Gender: [birth year] (53 y.o. M) Treating RN: Army Melia Primary Care Physician: Johny Drilling Other Clinician: Referring Physician: Johny Drilling Treating Physician/Extender: Sharalyn Ink in Treatment: 5 Education Assessment Education Provided To: Patient Education Topics Provided Wound/Skin Impairment: Handouts: Caring for Your Ulcer Methods: Demonstration, Explain/Verbal Responses: State content correctly Electronic Signature(s) Signed: 07/21/2019 4:16:03 PM By: Army Melia Entered By: Army Melia on 07/21/2019 14:30:02 [name] ([id number]) -------------------------------------------------------------------------------- Wound Assessment Details Patient Name: [name] Date of  Service: 07/21/2019 1:30 PM Medical Record Number: [id number] Patient Account Number: [id number] Date of Birth/Sex: April 08, 1966 (53 y.o. M) Treating RN: Harold Barban Primary Care Fritz Cauthon: Johny Drilling Other Clinician: Referring Enyah Moman: Johny Drilling Treating Alisa Stjames/Extender: Melburn Hake, HOYT Weeks in  Treatment: 5 Wound Status Wound Number: 2 Primary Etiology: Diabetic Wound/Ulcer of the Lower Extremity Wound Location: Left Toe Second Wound Status: Open Wounding Event: Gradually Appeared Comorbid Cataracts, Hypertension, Type II Diabetes Date Acquired: 05/30/2019 History: Weeks Of Treatment: 5 Clustered Wound: No Pending Amputation On Presentation Photos Wound Measurements Length: (cm) 0.9 % Reduction Width: (cm) 0.5 % Reduction Depth: (cm) 0.6 Epitheliali Area: (cm) 0.353 Tunneling: Volume: (cm) 0.212 Underminin in Area: 86.6% in Volume: 90% zation: None No g: No Wound Description Classification: Grade 3 Foul Odor Wound Margin: Distinct, outline attached Slough/Fib Exudate Amount: Medium Exudate Type: Serosanguineous Exudate Color: red, brown After Cleansing: No rino Yes Wound Bed Granulation Amount: Large (67-100%) Exposed Structure Granulation Quality: Red Fascia Exposed: No Necrotic Amount: Small (1-33%) Fat Layer (Subcutaneous Tissue) Exposed: Yes Necrotic Quality: Adherent Slough Tendon Exposed: No Muscle Exposed: No Joint Exposed: No Bone Exposed: No Treatment Notes [name], [name] ([id number]) Wound #2 (Left Toe Second) Notes prisma, conform and surgical shoes for both feet Electronic Signature(s) Signed: 07/21/2019 4:05:35 PM By: Harold Barban Entered By: Harold Barban on 07/21/2019 13:44:20 [name] ([id number]) -------------------------------------------------------------------------------- Vitals Details Patient Name: [name] Date of Service: 07/21/2019 1:30 PM Medical Record Number: [id number] Patient Account Number: [id number] Date of Birth/Sex: 15-Jun-1966 (52 y.o. M) Treating RN: Harold Barban Primary Care Trasean Delima: Johny Drilling Other Clinician: Referring Crystalee Ventress: Johny Drilling Treating Marrion Finan/Extender: Melburn Hake, HOYT Weeks in Treatment: 5 Vital Signs Time Taken: 13:35 Temperature (F): 98.1 Height (in): 75 Pulse  (bpm): 76 Weight (lbs): 240 Respiratory Rate (breaths/min): 18 Body Mass Index (BMI): 30 Blood Pressure (mmHg): 160/88 Reference Range: 80 - 120 mg / dl Electronic Signature(s) Signed: 07/21/2019 4:05:35 PM By: Harold Barban Entered By: Harold Barban on 07/21/2019 13:39:34

## 2019-08-04 ENCOUNTER — Encounter: Payer: BC Managed Care – PPO | Attending: Physician Assistant | Admitting: Physician Assistant

## 2019-08-04 ENCOUNTER — Other Ambulatory Visit: Payer: Self-pay

## 2019-08-04 DIAGNOSIS — M86672 Other chronic osteomyelitis, left ankle and foot: Secondary | ICD-10-CM | POA: Diagnosis not present

## 2019-08-04 DIAGNOSIS — E1169 Type 2 diabetes mellitus with other specified complication: Secondary | ICD-10-CM | POA: Insufficient documentation

## 2019-08-04 DIAGNOSIS — Z89421 Acquired absence of other right toe(s): Secondary | ICD-10-CM | POA: Diagnosis not present

## 2019-08-04 DIAGNOSIS — Z823 Family history of stroke: Secondary | ICD-10-CM | POA: Insufficient documentation

## 2019-08-04 DIAGNOSIS — Z833 Family history of diabetes mellitus: Secondary | ICD-10-CM | POA: Diagnosis not present

## 2019-08-04 DIAGNOSIS — F17200 Nicotine dependence, unspecified, uncomplicated: Secondary | ICD-10-CM | POA: Insufficient documentation

## 2019-08-04 DIAGNOSIS — E1142 Type 2 diabetes mellitus with diabetic polyneuropathy: Secondary | ICD-10-CM | POA: Diagnosis not present

## 2019-08-04 DIAGNOSIS — I252 Old myocardial infarction: Secondary | ICD-10-CM | POA: Diagnosis not present

## 2019-08-04 DIAGNOSIS — L97512 Non-pressure chronic ulcer of other part of right foot with fat layer exposed: Secondary | ICD-10-CM | POA: Diagnosis not present

## 2019-08-04 DIAGNOSIS — L97522 Non-pressure chronic ulcer of other part of left foot with fat layer exposed: Secondary | ICD-10-CM | POA: Diagnosis not present

## 2019-08-04 DIAGNOSIS — E11621 Type 2 diabetes mellitus with foot ulcer: Secondary | ICD-10-CM | POA: Insufficient documentation

## 2019-08-04 DIAGNOSIS — I10 Essential (primary) hypertension: Secondary | ICD-10-CM | POA: Diagnosis not present

## 2019-08-04 DIAGNOSIS — Z794 Long term (current) use of insulin: Secondary | ICD-10-CM | POA: Diagnosis not present

## 2019-08-04 DIAGNOSIS — Z8249 Family history of ischemic heart disease and other diseases of the circulatory system: Secondary | ICD-10-CM | POA: Insufficient documentation

## 2019-08-04 NOTE — Progress Notes (Addendum)
LORENZO, BILLITER ([id number]) Visit Report for 08/04/2019 Chief Complaint Document Details Patient Name: [name], TETLOW Date of Service: 08/04/2019 2:45 PM Medical Record Number: [id number] Patient Account Number: [id number] Date of Birth/Sex: September 21, 1966 (53 y.o. M) Treating RN: Army Melia Primary Care Provider: Johny Drilling Other Clinician: Referring Provider: Johny Drilling Treating Provider/Extender: Melburn Hake, HOYT Weeks in Treatment: 7 Information Obtained from: Patient Chief Complaint Bilateral foot ulcers Electronic Signature(s) Signed: 08/04/2019 3:30:30 PM By: Worthy Keeler PA-C Entered By: Worthy Keeler on 08/04/2019 15:30:30 [name] ([id number]) -------------------------------------------------------------------------------- Debridement Details Patient Name: [name] Date of Service: 08/04/2019 2:45 PM Medical Record Number: [id number] Patient Account Number: [id number] Date of Birth/Sex: 10-24-1965 (53 y.o. M) Treating RN: Army Melia Primary Care Provider: Johny Drilling Other Clinician: Referring Provider: Johny Drilling Treating Provider/Extender: Melburn Hake, HOYT Weeks in Treatment: 7 Debridement Performed for Wound #2 Left Toe Second Assessment: Performed By: Physician STONE III, HOYT E., PA-C Debridement Type: Debridement Severity of Tissue Pre Fat layer exposed Debridement: Level of Consciousness (Pre- Awake and Alert procedure): Pre-procedure Verification/Time Yes - 15:43 Out Taken: Start Time: 15:44 Pain Control: Lidocaine Total Area Debrided (L x W): 0.5 (cm) x 0.3 (cm) = 0.15 (cm) Tissue and other material Viable, Non-Viable, Slough, Subcutaneous, Slough, Other: dressing material debrided: Level: Skin/Subcutaneous Tissue Debridement Description: Excisional Instrument: Curette Bleeding: None End Time: 15:45 Response to Treatment: Procedure was tolerated well Level of Consciousness Awake and Alert (Post-procedure): Post Debridement  Measurements of Total Wound Length: (cm) 0.5 Width: (cm) 0.3 Depth: (cm) 0.5 Volume: (cm) 0.059 Character of Wound/Ulcer Post Debridement: Stable Severity of Tissue Post Debridement: Fat layer exposed Post Procedure Diagnosis Same as Pre-procedure Electronic Signature(s) Signed: 08/04/2019 3:45:35 PM By: Army Melia Signed: 08/04/2019 6:06:55 PM By: Worthy Keeler PA-C Entered By: Army Melia on 08/04/2019 15:45:35 [name] ([id number]) -------------------------------------------------------------------------------- HPI Details Patient Name: [name] Date of Service: 08/04/2019 2:45 PM Medical Record Number: [id number] Patient Account Number: [id number] Date of Birth/Sex: Feb 02, 1966 (53 y.o. M) Treating RN: Army Melia Primary Care Provider: Johny Drilling Other Clinician: Referring Provider: Johny Drilling Treating Provider/Extender: Melburn Hake, HOYT Weeks in Treatment: 7 History of Present Illness HPI Description: 06/14/2019 on evaluation today patient appears to be doing somewhat poorly in regard to his bilateral feet. 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 He could not give me an exact time. With that being said he currently has an issue with his right great toe on the dorsal surface where he has an open wound. On the distal end of his left second toe he has an open wound with bone exposed and the bone does appear to be necrotic in my opinion it does not appear to be as firm as should be. With that being said he also has an x-ray which was taken on the visit to the ER this past Sunday, 06/12/2019 on that x-ray it appears that he has a comminuted fracture of the left great toe and a distal fracture of the left second toe. Again my suspicion is may be secondary to issues with osteomyelitis he has not had any injury to the area. 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 He does have a history of diabetes mellitus type 2, peripheral neuropathy, again a right fifth toe amputation, and hypertension. 06/21/2019 on evaluation today patient  appears to be doing somewhat better with regard to his toe ulcer compared to last evaluation. He did grow 2 organisms that were cultured out which was revealed on the culture I reviewed today. Enterobacter was 1 of those and then the second with Pseudomonas. The good news is Cipro will help for both he has been taking Bactrim as well as Augmentin currently I do not think he needs to continue taking both of these I think we can switch to Cipro and cover pretty much everything that we need to this is good news. Fortunately there is no signs of systemic infection and overall I do feel like his toe ulcer is better he still has some necrotic bone on the surface that is noted as well as some hyper granular tissue and again necrotic tissue on the distal portion of the toe but again I think that cleaning this away we can hopefully get this area to heal up and not end up with another amputation that is what he is wanting to try to avoid at this time. 06/30/2019 on evaluation today patient actually appears to be doing quite well with regard to his toe ulcer all things considering. He did have an MRI which I reviewed that was completed and it did show that he has a comminuted fracture at the tip of the toe again this is likely due to the fact that we did obtain a sample of the bone which was necrotic and showed positive pathology for acute osteomyelitis. With that being said he also has evidence of potential septic arthritis of the DIP joint. All that was noted on MRI. With that being said his culture did show evidence of Pseudomonas and Enterobacter. He is on the Cipro which seems to be doing well they were actually pleased with this to be honest.  He has not heard from infectious disease as of yet. In regard to the EKG that we wanted him to have done he was actually somewhat concerned about this and really wants to see his primary care provider to see what he thinks about the hyperbarics in regard to the EKG or if the patient possibly needs to see a cardiologist before considering hyperbarics. Again I completely understand his concern in this regard I have no issues with that and my suggestion at this time would be that he go ahead and proceed with getting all that settled and clearing his mind before we consider the hyperbarics. 07/07/2019 on evaluation today patient seems to be doing well with regard to his toe ulcer all things considering. With that being said he has not been without his antibiotics until just this morning he did not have a dose he does need a refill of this. With that being said he also states that he unfortunately has been still somewhat concerned about the hyperbarics nonetheless again I cannot argue with the fact that his foot seems to be doing much better in regard to the toe ulcer. Overall we have gotten the necrotic bone off by way of debridement when I sent the pathology and culture results and subsequently has been on the Cipro which seems to have done well for him as well. I cannot argue with the results in regard to this. With that being said I did refer him to infectious disease I have not seen or heard as to why he has not been contacted will need to follow-up on this. 07/21/2019 on evaluation today patient appears to actually be doing somewhat better at this  point with regard to his toe ulcer. This is showing signs of improvement. He still does not really not at the point of wanting to proceed with hyperbarics since things seem to be improving that is good news is not having near as much drainage as he was in the beginning. I can still have 1 small area that probes down to bone but this is a small roughly 1 mm  opening to that area the rest is covered with good granulation tissue and overall I feel like he is doing quite well. MOSSIMO, SZATKOWSKI ([id number]) 08/04/2019 on evaluation today patient appears to initially have a lot of the dressing dry down into the wound bed. I think that this is a lot of the collagen insulin that we have been utilizing and that is why it kind of dried out got stuck I think that they may be using a little bit too much and I explained that to them today. Subsequently I am overall fairly pleased with the appearance that I see I think we need to probably see him on a regular basis in order to make sure we keep this clear so that he can actually heal when it dries him like it was just now it prevents it from being able to have any appropriate or rapid healing. He still at this point is not wanting to proceed with the hyperbarics. Electronic Signature(s) Signed: 08/04/2019 3:41:39 PM By: Worthy Keeler PA-C Entered By: Worthy Keeler on 08/04/2019 15:41:38 ROMELIO, KLEIN ([id number]) -------------------------------------------------------------------------------- Physical Exam Details Patient Name: [name] Date of Service: 08/04/2019 2:45 PM Medical Record Number: [id number] Patient Account Number: [id number] Date of Birth/Sex: 1965-10-31 (53 y.o. M) Treating RN: Army Melia Primary Care Provider: Johny Drilling Other Clinician: Referring Provider: Johny Drilling Treating Provider/Extender: Melburn Hake, HOYT Weeks in Treatment: 7 Constitutional Well-nourished and well-hydrated in no acute distress. Respiratory normal breathing without difficulty. Psychiatric this patient is able to make decisions and demonstrates good insight into disease process. Alert and Oriented x 3. pleasant and cooperative. Notes Patient's wound bed currently showed signs of good granulation at this time the does not appear to be any evidence of active infection which is good news. No fevers, chills,  nausea, vomiting, or diarrhea. I did have to perform sharp debridement clear away some of the dressing material from the base of the wound. Electronic Signature(s) Signed: 08/04/2019 3:43:27 PM By: Worthy Keeler PA-C Entered By: Worthy Keeler on 08/04/2019 15:43:26 [name] ([id number]) -------------------------------------------------------------------------------- Physician Orders Details Patient Name: [name] Date of Service: 08/04/2019 2:45 PM Medical Record Number: [id number] Patient Account Number: [id number] Date of Birth/Sex: [birth year] (53 y.o. 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 Primary Wound Dressing Wound #2 Left Toe Second o Silver Collagen Secondary Dressing Wound #2 Left Toe Second o Conform/Kerlix Dressing Change  Frequency Wound #2 Left Toe Second o Change dressing every day. Follow-up Appointments o Return Appointment in 1 week. Off-Loading Wound #2 Left Toe Second o Open toe surgical shoe to: Hyperbaric Oxygen Therapy Wound #2 Left Toe Second o Evaluate for HBO Therapy o Indication: - chronic refractory osteomyelitis o If appropriate for treatment, begin HBOT per  protocol: o 2.0 ATA for 90 Minutes without Air Breaks o One treatment per day (delivered Monday through Friday unless otherwise specified in Special Instructions below): EAMES, CERROS ([id number]) o Total # of Treatments: - 40 o Finger stick Blood Glucose Pre- and Post- HBOT Treatment. o Follow Hyperbaric Oxygen Glycemia Protocol GLYCEMIA INTERVENTIONS PROTOCOL PRE-HBO GLYCEMIA INTERVENTIONS ACTION INTERVENTION Obtain pre-HBO capillary blood glucose 1 (ensure physician order is in chart). A. Notify HBO physician and await physician orders. 2 If result is 70 mg/dl or below: B. If the result meets the hospital definition of a critical result, follow hospital policy. A. Give patient an 8 ounce Glucerna Shake, an 8 ounce Ensure, or 8 ounces of a Glucerna/Ensure equivalent dietary supplement*. B. Wait 30 minutes. C. Retest patientos capillary blood If result is 71 mg/dl to 130 mg/dl: glucose (CBG). D. If result greater than or equal to 110 mg/dl, proceed with HBO. If result less than 110 mg/dl, notify HBO physician and consider holding HBO. If result is 131 mg/dl to 249 mg/dl: A. Proceed with HBO. A. Notify HBO physician and await physician orders. B. It is recommended to hold HBO and If result is 250 mg/dl or greater: do blood/urine ketone testing. C. If the result meets the hospital definition of a critical result, follow hospital policy. POST-HBO GLYCEMIA INTERVENTIONS ACTION INTERVENTION Obtain post HBO capillary blood glucose 1 (ensure physician order is in chart). A. Notify HBO physician and await physician orders. 2 If result is 70 mg/dl or below: B. If the result meets the hospital definition of a critical result, follow hospital policy. A. Give patient an 8 ounce Glucerna Shake, an 8 ounce Ensure, or 8 ounces of a Glucerna/Ensure equivalent dietary supplement*. B. Wait 15 minutes for symptoms of hypoglycemia (i.e. nervousness, anxiety,  sweating, chills, If result is 71 mg/dl to 100 mg/dl: clamminess, irritability, confusion, tachycardia or dizziness). C. If patient asymptomatic, discharge patient. If patient symptomatic, repeat capillary blood glucose (CBG) and notify HBO physician. CARLYN, MCCOLLUM ([id number]) If result is 101 mg/dl to 249 mg/dl: A. Discharge patient. A. Notify HBO physician and await physician orders. B. It is recommended to do blood/urine If result is 250 mg/dl or greater: ketone testing. C. If the result meets the hospital definition of a critical result, follow hospital policy. *Juice or candies are NOT equivalent products. If patient refuses the Glucerna or Ensure, please consult the hospital dietitian for an appropriate substitute. Electronic Signature(s) Signed: 08/04/2019 4:39:17 PM By: Army Melia Signed: 08/04/2019 6:06:55 PM By: Worthy Keeler PA-C Entered By: Army Melia on 08/04/2019 15:41:26 [name] ([id number]) -------------------------------------------------------------------------------- Problem List Details Patient Name: [name] Date of Service: 08/04/2019 2:45 PM Medical Record Number: [id number] Patient Account Number: [id number] Date of Birth/Sex: [birth year] (53 y.o. M) Treating RN: Army Melia Primary Care Provider: Johny Drilling Other Clinician: Referring Provider: Johny Drilling Treating Provider/Extender: Melburn Hake, HOYT Weeks in Treatment: 7 Active Problems ICD-10 Evaluated Encounter Code Description Active Date Today Diagnosis E11.621 Type 2 diabetes mellitus with foot ulcer 06/14/2019 No Yes L97.512 Non-pressure chronic ulcer of other part of right foot with fat 06/14/2019 No Yes layer exposed M86.672 Other chronic osteomyelitis, left ankle  and foot 06/21/2019 No Yes L97.522 Non-pressure chronic ulcer of other part of left foot with fat 06/14/2019 No Yes layer exposed Z89.421 Acquired absence of other right toe(s) 06/14/2019 No Yes I10 Essential (primary)  hypertension 06/14/2019 No Yes Inactive Problems Resolved Problems Electronic Signature(s) Signed: 08/04/2019 3:30:24 PM By: Worthy Keeler PA-C Entered By: Worthy Keeler on 08/04/2019 15:30:24 [name] ([id number]) -------------------------------------------------------------------------------- Progress Note Details Patient Name: [name] Date of Service: 08/04/2019 2:45 PM Medical Record Number: [id number] Patient Account Number: [id number] Date of Birth/Sex: 1965/11/04 (53 y.o. M) Treating RN: Army Melia Primary Care Provider: Johny Drilling Other Clinician: Referring Provider: Johny Drilling Treating Provider/Extender: Melburn Hake, HOYT Weeks in Treatment: 7 Subjective Chief Complaint Information obtained from Patient Bilateral foot ulcers History of Present Illness (HPI) 06/14/2019 on evaluation today patient appears to be doing somewhat poorly in regard to his bilateral feet. 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 He could not give me an exact time. With that being said he currently has an issue with his right great toe on the dorsal surface where he has an open wound. On the distal end of his left second toe he has an open wound with bone exposed and the bone does appear to be necrotic in my opinion it does not appear to be as firm as should be. With that being said he also has an x-ray which was taken on the visit to the ER this past Sunday, 06/12/2019 on that x-ray it appears that he has a comminuted fracture of the left great toe and a distal fracture of the left second toe. Again my suspicion is may be secondary to issues with osteomyelitis he has not had any injury to the area. 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 He does have a history of diabetes mellitus  type 2, peripheral neuropathy, again a right fifth toe amputation, and hypertension. 06/21/2019 on evaluation today patient appears to be doing somewhat better with regard to his toe ulcer compared to last evaluation. He did grow 2 organisms that were cultured out which was revealed on the culture I reviewed today. Enterobacter was 1 of those and then the second with Pseudomonas. The good news is Cipro will help for both he has been taking Bactrim as well as Augmentin currently I do not think he needs to continue taking both of these I think we can switch to Cipro and cover pretty much everything that we need to this is good news. Fortunately there is no signs of systemic infection and overall I do feel like his toe ulcer is better he still has some necrotic bone on the surface that is noted as well as some hyper granular tissue and again necrotic tissue on the distal portion of the toe but again I think that cleaning this away we can hopefully get this area to heal up and not end up with another amputation that is what he is wanting to try to avoid at this time. 06/30/2019 on evaluation today patient actually appears to be doing quite well with regard to his toe ulcer all things considering. He did have an MRI which I reviewed that was completed and it did show that he has a comminuted fracture at the tip of the toe again this is likely due to the  fact that we did obtain a sample of the bone which was necrotic and showed positive pathology for acute osteomyelitis. With that being said he also has evidence of potential septic arthritis of the DIP joint. All that was noted on MRI. With that being said his culture did show evidence of Pseudomonas and Enterobacter. He is on the Cipro which seems to be doing well they were actually pleased with this to be honest. He has not heard from infectious disease as of yet. In regard to the EKG that we wanted him to have done he was actually somewhat concerned about  this and really wants to see his primary care provider to see what he thinks about the hyperbarics in regard to the EKG or if the patient possibly needs to see a cardiologist before considering hyperbarics. Again I completely understand his concern in this regard I have no issues with that and my suggestion at this time would be that he go ahead and proceed with getting all that settled and clearing his mind before we consider the hyperbarics. 07/07/2019 on evaluation today patient seems to be doing well with regard to his toe ulcer all things considering. With that being said he has not been without his antibiotics until just this morning he did not have a dose he does need a refill of this. With that being said he also states that he unfortunately has been still somewhat concerned about the hyperbarics nonetheless again I cannot argue with the fact that his foot seems to be doing much better in regard to the toe ulcer. Overall we have gotten the necrotic bone off by way of debridement when I sent the pathology and culture results and subsequently has been on the Cipro which seems to have done well for him as well. I cannot argue with the results in regard to this. With that being said I did refer him to infectious disease I have not seen or heard as to why he has not been contacted will need to follow-up on this. COURY, SCHOEPP ([id number]) 07/21/2019 on evaluation today patient appears to actually be doing somewhat better at this point with regard to his toe ulcer. This is showing signs of improvement. He still does not really not at the point of wanting to proceed with hyperbarics since things seem to be improving that is good news is not having near as much drainage as he was in the beginning. I can still have 1 small area that probes down to bone but this is a small roughly 1 mm opening to that area the rest is covered with good granulation tissue and overall I feel like he is doing quite  well. 08/04/2019 on evaluation today patient appears to initially have a lot of the dressing dry down into the wound bed. I think that this is a lot of the collagen insulin that we have been utilizing and that is why it kind of dried out got stuck I think that they may be using a little bit too much and I explained that to them today. Subsequently I am overall fairly pleased with the appearance that I see I think we need to probably see him on a regular basis in order to make sure we keep this clear so that he can actually heal when it dries him like it was just now it prevents it from being able to have any appropriate or rapid healing. He still at this point is not wanting to proceed with the hyperbarics.  Patient History Information obtained from Patient. Family History Diabetes - Father, Hypertension - Father, Stroke - Siblings, No family history of Cancer, Heart Disease, Kidney Disease, Lung Disease, Seizures, Thyroid Problems, Tuberculosis. Social History Current every day smoker, Marital Status - Single, Alcohol Use - Daily, Drug Use - No History, Caffeine Use - Moderate. Medical History Eyes Patient has history of Cataracts Denies history of Glaucoma, Optic Neuritis Ear/Nose/Mouth/Throat Denies history of Chronic sinus problems/congestion, Middle ear problems Hematologic/Lymphatic Denies history of Anemia, Hemophilia, Human Immunodeficiency Virus, Lymphedema, Sickle Cell Disease Respiratory Denies history of Aspiration, Asthma, Chronic Obstructive Pulmonary Disease (COPD), Pneumothorax, Sleep Apnea, Tuberculosis Cardiovascular Patient has history of Hypertension Denies history of Angina, Arrhythmia, Congestive Heart Failure, Coronary Artery Disease, Deep Vein Thrombosis, Hypotension, Myocardial Infarction, Peripheral Arterial Disease, Peripheral Venous Disease, Phlebitis, Vasculitis Gastrointestinal Denies history of Cirrhosis , Colitis, Crohn s, Hepatitis A, Hepatitis B,  Hepatitis C Endocrine Patient has history of Type II Diabetes Genitourinary Denies history of End Stage Renal Disease Immunological Denies history of Lupus Erythematosus, Raynaud s, Scleroderma Integumentary (Skin) Denies history of History of Burn, History of pressure wounds Musculoskeletal Denies history of Gout, Rheumatoid Arthritis, Osteoarthritis, Osteomyelitis Neurologic Denies history of Dementia, Neuropathy, Quadriplegia, Paraplegia, Seizure Disorder Oncologic Denies history of Received Chemotherapy, Received Radiation Psychiatric Denies history of Anorexia/bulimia, Confinement Anxiety Hospitalization/Surgery History - [facility] for amputation. MADEX, BATTERSON ([id number]) Review of Systems (ROS) Constitutional Symptoms (General Health) Denies complaints or symptoms of Fatigue, Fever, Chills, Marked Weight Change. Respiratory Denies complaints or symptoms of Chronic or frequent coughs, Shortness of Breath. Cardiovascular Denies complaints or symptoms of Chest pain, LE edema. Psychiatric Denies complaints or symptoms of Anxiety, Claustrophobia. Objective Constitutional Well-nourished and well-hydrated in no acute distress. Vitals Time Taken: 3:12 PM, Height: 75 in, Weight: 240 lbs, BMI: 30, Temperature: 98.0 F, Pulse: 74 bpm, Respiratory Rate: 16 breaths/min, Blood Pressure: 150/86 mmHg. Respiratory normal breathing without difficulty. Psychiatric this patient is able to make decisions and demonstrates good insight into disease process. Alert and Oriented x 3. pleasant and cooperative. General Notes: Patient's wound bed currently showed signs of good granulation at this time the does not appear to be any evidence of active infection which is good news. No fevers, chills, nausea, vomiting, or diarrhea. I did have to perform sharp debridement clear away some of the dressing material from the base of the wound. Integumentary (Hair, Skin) Wound #2 status is Open. Original cause  of wound was Gradually Appeared. The wound is located on the Left Toe Second. The wound measures 0.5cm length x 0.3cm width x 0.5cm depth; 0.118cm^2 area and 0.059cm^3 volume. There is Fat Layer (Subcutaneous Tissue) Exposed exposed. There is no tunneling or undermining noted. There is a medium amount of serosanguineous drainage noted. The wound margin is distinct with the outline attached to the wound base. There is large (67-100%) red granulation within the wound bed. There is a small (1-33%) amount of necrotic tissue within the wound bed including Adherent Slough. 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 Primary Wound Dressing: Wound #  2 Left Toe Second: Silver Collagen Secondary Dressing: Wound #2 Left Toe Second: Conform/Kerlix Dressing Change Frequency: Wound #2 Left Toe Second: Change dressing every day. Follow-up Appointments: Return Appointment in 1 week. Off-Loading: Wound #2 Left Toe Second: Open toe surgical shoe to: Hyperbaric Oxygen Therapy: Wound #2 Left Toe Second: Evaluate for HBO Therapy Indication: - chronic refractory osteomyelitis If appropriate for treatment, begin HBOT per protocol: 2.0 ATA for 90 Minutes without Air Breaks One treatment per day (delivered Monday through Friday unless otherwise specified in Special Instructions below): Total # of Treatments: - 40 Finger stick Blood Glucose Pre- and Post- HBOT Treatment. Follow  Hyperbaric Oxygen Glycemia Protocol 1. My suggestion at this time is can be that we use collagen I am to recommend that along with the collagen we go ahead and use hydrogel to prevent this from drying out like it did this past time. I do not want that happening because the meds can slow down his healing. 2. I am also going to recommend that we go ahead and have him continue to use the open toe surgical shoe as this will help prevent any issues at this point. Point with pressure getting to the toe. 3. Also do not think that he needs to be taken a shower at the gym I think that skin to be potentially detrimental and nose try to keep the safe and rapid up with a Saran wrap but nonetheless something still could get inside and that can be very bad for him. I still think hyperbarics could be of benefit as far as getting this to heal but at this point he is not wanting to proceed as such yet. We will see patient back for reevaluation in 1 week here in the clinic. If anything worsens or changes patient will contact our office for additional recommendations. Electronic Signature(s) KOA, BIDERMAN ([id number]) Signed: 08/04/2019 3:45:08 PM By: Worthy Keeler PA-C Entered By: Worthy Keeler on 08/04/2019 15:45:07 [name] ([id number]) -------------------------------------------------------------------------------- ROS/PFSH Details Patient Name: [name] Date of Service: 08/04/2019 2:45 PM Medical Record Number: [id number] Patient Account Number: [id number] Date of Birth/Sex: [birth year] (53 y.o. M) Treating RN: Army Melia Primary Care Provider: Johny Drilling Other Clinician: Referring Provider: Johny Drilling Treating Provider/Extender: Melburn Hake, HOYT Weeks in Treatment: 7 Information Obtained From Patient Constitutional Symptoms (General Health) Complaints and Symptoms: Negative for: Fatigue; Fever; Chills; Marked Weight Change Respiratory Complaints and Symptoms: Negative for: Chronic or  frequent coughs; Shortness of Breath Medical History: Negative for: Aspiration; Asthma; Chronic Obstructive Pulmonary Disease (COPD); Pneumothorax; Sleep Apnea; Tuberculosis Cardiovascular Complaints and Symptoms: Negative for: Chest pain; LE edema Medical History: Positive for: Hypertension Negative for: Angina; Arrhythmia; Congestive Heart Failure; Coronary Artery Disease; Deep Vein Thrombosis; Hypotension; Myocardial Infarction; Peripheral Arterial Disease; Peripheral Venous Disease; Phlebitis; Vasculitis Psychiatric Complaints and Symptoms: Negative for: Anxiety; Claustrophobia Medical History: Negative for: Anorexia/bulimia; Confinement Anxiety Eyes Medical History: Positive for: Cataracts Negative for: Glaucoma; Optic Neuritis Ear/Nose/Mouth/Throat Medical History: Negative for: Chronic sinus problems/congestion; Middle ear problems Hematologic/Lymphatic Medical History: Negative for: Anemia; Hemophilia; Human Immunodeficiency Virus; Lymphedema; Sickle Cell Disease KAHLEL, VANNUCCI ([id number]) Gastrointestinal Medical History: Negative for: Cirrhosis ; Colitis; Crohnos; Hepatitis A; Hepatitis B; Hepatitis C Endocrine Medical History: Positive for: Type II Diabetes Treated with: Oral agents Genitourinary Medical History: Negative for: End Stage Renal Disease Immunological Medical History: Negative for: Lupus Erythematosus; Raynaudos; Scleroderma Integumentary (Skin) Medical History: Negative for: History of Burn; History of pressure wounds Musculoskeletal Medical History: Negative for: Gout; Rheumatoid Arthritis; Osteoarthritis; Osteomyelitis  Neurologic Medical History: Negative for: Dementia; Neuropathy; Quadriplegia; Paraplegia; Seizure Disorder Oncologic Medical History: Negative for: Received Chemotherapy; Received Radiation HBO Extended History Items Eyes: Cataracts Immunizations Pneumococcal Vaccine: Received Pneumococcal Vaccination: No Implantable  Devices None Hospitalization / Surgery History Type of Hospitalization/Surgery [facility] for amputation Family and Social History JAYDIS, WILLIAM ([id number]) Cancer: No; Diabetes: Yes - Father; Heart Disease: No; Hypertension: Yes - Father; Kidney Disease: No; Lung Disease: No; Seizures: No; Stroke: Yes - Siblings; Thyroid Problems: No; Tuberculosis: No; Current every day smoker; Marital Status - Single; Alcohol Use: Daily; Drug Use: No History; Caffeine Use: Moderate; Financial Concerns: No; Food, Clothing or Shelter Needs: No; Support System Lacking: No; Transportation Concerns: No Physician Affirmation I have reviewed and agree with the above information. Electronic Signature(s) Signed: 08/04/2019 4:39:17 PM By: Army Melia Signed: 08/04/2019 6:06:55 PM By: Worthy Keeler PA-C Entered By: Worthy Keeler on 08/04/2019 15:43:03 [name] ([id number]) -------------------------------------------------------------------------------- SuperBill Details Patient Name: [name] Date of Service: 08/04/2019 Medical Record Number: [id number] Patient Account Number: [id number] Date of Birth/Sex: May 03, 1966 (53 y.o. M) Treating RN: Army Melia Primary Care Provider: Johny Drilling Other Clinician: Referring Provider: Johny Drilling Treating Provider/Extender: Melburn Hake, HOYT Weeks in Treatment: 7 Diagnosis Coding ICD-10 Codes Code Description E11.621 Type 2 diabetes mellitus with foot ulcer L97.512 Non-pressure chronic ulcer of other part of right foot with fat layer exposed M86.672 Other chronic osteomyelitis, left ankle and foot L97.522 Non-pressure chronic ulcer of other part of left foot with fat layer exposed Z89.421 Acquired absence of other right toe(s) I10 Essential (primary) hypertension Facility Procedures CPT4 Code: [id number] Description: 99213 - WOUND CARE VISIT-LEV 3 EST PT Modifier: Quantity: 1 Physician Procedures CPT4 Code: [id number] Description: 99214 - WC PHYS LEVEL 4 - EST  PT ICD-10 Diagnosis Description E11.621 Type 2 diabetes mellitus with foot ulcer L97.512 Non-pressure chronic ulcer of other part of right foot with fat M86.672 Other chronic osteomyelitis, left ankle and foot  L97.522 Non-pressure chronic ulcer of other part of left foot with fat Modifier: layer exposed layer exposed Quantity: 1 Electronic Signature(s) Signed: 08/04/2019 3:45:19 PM By: Worthy Keeler PA-C Entered By: Worthy Keeler on 08/04/2019 15:45:19

## 2019-08-04 NOTE — Progress Notes (Signed)
ISSA, SUNIGA ([id number]) Visit Report for 08/04/2019 Arrival Information Details Patient Name: [name], [name] Date of Service: 08/04/2019 2:45 PM Medical Record Number: [id number] Patient Account Number: [id number] Date of Birth/Sex: 06-Mar-1966 (53 y.o. M) Treating RN: Army Melia Primary Care Amarys Sliwinski: Johny Drilling Other Clinician: Referring Shirlean Berman: Johny Drilling Treating Kehinde Totzke/Extender: Melburn Hake, HOYT Weeks in Treatment: 7 Visit Information History Since Last Visit Added or deleted any medications: No Patient Arrived: Ambulatory Any new allergies or adverse reactions: No Arrival Time: 15:11 Had a fall or experienced change in No Accompanied By: friend activities of daily living that may affect Transfer Assistance: None risk of falls: Patient Identification Verified: Yes Signs or symptoms of abuse/neglect since last visito No Secondary Verification Process Completed: Yes Hospitalized since last visit: No Implantable device outside of the clinic excluding No cellular tissue based products placed in the center since last visit: Has Dressing in Place as Prescribed: Yes Pain Present Now: No Electronic Signature(s) Signed: 08/04/2019 4:57:32 PM By: Lorine Bears RCP, RRT, CHT Entered By: Lorine Bears on 08/04/2019 15:11:48 [name] ([id number]) -------------------------------------------------------------------------------- Clinic Level of Care Assessment Details Patient Name: [name] Date of Service: 08/04/2019 2:45 PM Medical Record Number: [id number] Patient Account Number: [id number] Date of Birth/Sex: [birth year] (53 y.o. M) Treating RN: Army Melia Primary Care Aijalon Demuro: Johny Drilling Other Clinician: Referring Kentrail Shew: Johny Drilling Treating Magdalene Tardiff/Extender: Melburn Hake, HOYT Weeks in Treatment: 7 Clinic Level of Care Assessment Items TOOL 4 Quantity Score []  - Use when only an EandM is performed on FOLLOW-UP visit  0 ASSESSMENTS - Nursing Assessment / Reassessment []  - Reassessment of Co-morbidities (includes updates in patient status) 0 []  - 0 Reassessment of Adherence to Treatment Plan ASSESSMENTS - Wound and Skin Assessment / Reassessment []  - Simple Wound Assessment / Reassessment - one wound 0 []  - 0 Complex Wound Assessment / Reassessment - multiple wounds []  - 0 Dermatologic / Skin Assessment (not related to wound area) ASSESSMENTS - Focused Assessment []  - Circumferential Edema Measurements - multi extremities 0 []  - 0 Nutritional Assessment / Counseling / Intervention []  - 0 Lower Extremity Assessment (monofilament, tuning fork, pulses) []  - 0 Peripheral Arterial Disease Assessment (using hand held doppler) ASSESSMENTS - Ostomy and/or Continence Assessment and Care []  - Incontinence Assessment and Management 0 []  - 0 Ostomy Care Assessment and Management (repouching, etc.) PROCESS - Coordination of Care []  - Simple Patient / Family Education for ongoing care 0 []  - 0 Complex (extensive) Patient / Family Education for ongoing care []  - 0 Staff obtains Programmer, systems, Records, Test Results / Process Orders []  - 0 Staff telephones HHA, Nursing Homes / Clarify orders / etc []  - 0 Routine Transfer to another Facility (non-emergent condition) []  - 0 Routine Hospital Admission (non-emergent condition) []  - 0 New Admissions / Biomedical engineer / Ordering NPWT, Apligraf, etc. []  - 0 Emergency Hospital Admission (emergent condition) []  - 0 Simple Discharge Coordination JAMMIE, KALAN ([id number]) []  - 0 Complex (extensive) Discharge Coordination PROCESS - Special Needs []  - Pediatric / Minor Patient Management 0 []  - 0 Isolation Patient Management []  - 0 Hearing / Language / Visual special needs []  - 0 Assessment of Community assistance (transportation, D/C planning, etc.) []  - 0 Additional assistance / Altered mentation []  - 0 Support Surface(s) Assessment (bed, cushion,  seat, etc.) INTERVENTIONS - Wound Cleansing / Measurement []  - Simple Wound Cleansing - one wound 0 []  - 0 Complex Wound Cleansing - multiple wounds []  - 0 Wound Imaging (photographs - any number  of wounds) []  - 0 Wound Tracing (instead of photographs) []  - 0 Simple Wound Measurement - one wound []  - 0 Complex Wound Measurement - multiple wounds INTERVENTIONS - Wound Dressings []  - Small Wound Dressing one or multiple wounds 0 []  - 0 Medium Wound Dressing one or multiple wounds []  - 0 Large Wound Dressing one or multiple wounds []  - 0 Application of Medications - topical []  - 0 Application of Medications - injection INTERVENTIONS - Miscellaneous []  - External ear exam 0 []  - 0 Specimen Collection (cultures, biopsies, blood, body fluids, etc.) []  - 0 Specimen(s) / Culture(s) sent or taken to Lab for analysis []  - 0 Patient Transfer (multiple staff / Civil Service fast streamer / Similar devices) []  - 0 Simple Staple / Suture removal (25 or less) []  - 0 Complex Staple / Suture removal (26 or more) []  - 0 Hypo / Hyperglycemic Management (close monitor of Blood Glucose) []  - 0 Ankle / Brachial Index (ABI) - do not check if billed separately []  - 0 Vital Signs Orsini, Eddie Dibbles ([id number]) Has the patient been seen at the hospital within the last three years: Yes Total Score: 0 Level Of Care: ____ Electronic Signature(s) Signed: 08/04/2019 4:39:17 PM By: Army Melia Entered By: Army Melia on 08/04/2019 [name], [name] ([id number]) -------------------------------------------------------------------------------- Encounter Discharge Information Details Patient Name: [name] Date of Service: 08/04/2019 2:45 PM Medical Record Number: [id number] Patient Account Number: [id number] Date of Birth/Sex: [birth year] (53 y.o. M) Treating RN: Army Melia Primary Care Gita Dilger: Johny Drilling Other Clinician: Referring Aleksey Newbern: Johny Drilling Treating Baine Decesare/Extender: Melburn Hake, HOYT Weeks  in Treatment: 7 Encounter Discharge Information Items Discharge Condition: Stable Ambulatory Status: Ambulatory Discharge Destination: Home Transportation: Private Auto Accompanied By: spouse Schedule Follow-up Appointment: Yes Clinical Summary of Care: Electronic Signature(s) Signed: 08/04/2019 4:39:17 PM By: Army Melia Entered By: Army Melia on 08/04/2019 15:42:54 [name] ([id number]) -------------------------------------------------------------------------------- Lower Extremity Assessment Details Patient Name: [name] Date of Service: 08/04/2019 2:45 PM Medical Record Number: [id number] Patient Account Number: [id number] Date of Birth/Sex: 1966/07/26 (53 y.o. M) Treating RN: Cornell Barman Primary Care Jajuan Skoog: Johny Drilling Other Clinician: Referring Ranessa Kosta: Johny Drilling Treating Baker Moronta/Extender: Melburn Hake, HOYT Weeks in Treatment: 7 Vascular Assessment Pulses: Dorsalis Pedis Palpable: [Left:Yes] Electronic Signature(s) Signed: 08/04/2019 5:19:38 PM By: Gretta Cool, BSN, RN, CWS, Kim RN, BSN Entered By: Gretta Cool, BSN, RN, CWS, Kim on 08/04/2019 15:28:53 [name] ([id number]) -------------------------------------------------------------------------------- Multi Wound Chart Details Patient Name: [name] Date of Service: 08/04/2019 2:45 PM Medical Record Number: [id number] Patient Account Number: [id number] Date of Birth/Sex: 1965-11-06 (53 y.o. M) Treating RN: Army Melia Primary Care Domino Holten: Johny Drilling Other Clinician: Referring Aydrian Halpin: Johny Drilling Treating Tilda Samudio/Extender: Melburn Hake, HOYT Weeks in Treatment: 7 Vital Signs Height(in): 75 Pulse(bpm): 74 Weight(lbs): 240 Blood Pressure(mmHg): 150/86 Body Mass Index(BMI): 30 Temperature(F): 98.0 Respiratory Rate 16 (breaths/min): Photos: [2:No Photos] [N/A:N/A] Wound Location: [2:Left Toe Second] [N/A:N/A] Wounding Event: [2:Gradually Appeared] [N/A:N/A] Primary Etiology: [2:Diabetic  Wound/Ulcer of the Lower Extremity] [N/A:N/A] Date Acquired: [2:05/30/2019] [N/A:N/A] Weeks of Treatment: [2:7] [N/A:N/A] Wound Status: [2:Open] [N/A:N/A] Pending Amputation on [2:Yes] [N/A:N/A] Presentation: Measurements L x W x D [2:0.5x0.3x0.5] [N/A:N/A] (cm) Area (cm) : [2:0.118] [N/A:N/A] Volume (cm) : [2:0.059] [N/A:N/A] % Reduction in Area: [2:95.50%] [N/A:N/A] % Reduction in Volume: [2:97.20% Grade 3] [N/A:N/A N/A] Treatment Notes Electronic Signature(s) Signed: 08/04/2019 4:39:17 PM By: Army Melia Entered By: Army Melia on 08/04/2019 15:37:37 [name] ([id number]) -------------------------------------------------------------------------------- [place] Details Patient Name: [name] Date of Service: 08/04/2019 2:45 PM Medical  Record Number: [id number] Patient Account Number: [id number] Date of Birth/Sex: October 24, 1965 (53 y.o. M) Treating RN: Army Melia Primary Care Mindie Rawdon: Johny Drilling Other Clinician: Referring Barbra Miner: Johny Drilling Treating Cristin Szatkowski/Extender: Melburn Hake, HOYT Weeks in Treatment: 7 Active Inactive Abuse / Safety / Falls / Self Care Management Nursing Diagnoses: Potential for falls Goals: Patient will not experience any injury related to falls Date Initiated: 06/14/2019 Target Resolution Date: 09/03/2019 Goal Status: Active Interventions: Assess fall risk on admission and as needed Notes: Necrotic Tissue Nursing Diagnoses: Knowledge deficit related to management of necrotic/devitalized tissue Goals: Patient/caregiver will verbalize understanding of reason and process for debridement of necrotic tissue Date Initiated: 06/14/2019 Target Resolution Date: 09/03/2019 Goal Status: Active Interventions: Assess patient pain level pre-, during and post procedure and prior to discharge Notes: Orientation to the Wound Care Program Nursing Diagnoses: Knowledge deficit related to the wound healing center  program Goals: Patient/caregiver will verbalize understanding of the [facility] Date Initiated: 06/14/2019 Target Resolution Date: 09/03/2019 Goal Status: Active Interventions: Provide education on orientation to the wound center Calhoun, Eddie Dibbles ([id number]) Notes: Peripheral Neuropathy Nursing Diagnoses: Potential alteration in peripheral tissue perfusion (select prior to confirmation of diagnosis) Goals: Patient/caregiver will verbalize understanding of disease process and disease management Date Initiated: 06/14/2019 Target Resolution Date: 09/03/2019 Goal Status: Active Interventions: Assess signs and symptoms of neuropathy upon admission and as needed Notes: Wound/Skin Impairment Nursing Diagnoses: Impaired tissue integrity Goals: Ulcer/skin breakdown will heal within 14 weeks Date Initiated: 06/14/2019 Target Resolution Date: 09/03/2019 Goal Status: Active Interventions: Assess patient/caregiver ability to obtain necessary supplies Assess patient/caregiver ability to perform ulcer/skin care regimen upon admission and as needed Assess ulceration(s) every visit Notes: Electronic Signature(s) Signed: 08/04/2019 4:39:17 PM By: Army Melia Entered By: Army Melia on 08/04/2019 15:37:30 [name] ([id number]) -------------------------------------------------------------------------------- Pain Assessment Details Patient Name: [name] Date of Service: 08/04/2019 2:45 PM Medical Record Number: [id number] Patient Account Number: [id number] Date of Birth/Sex: 11-17-1965 (53 y.o. M) Treating RN: Army Melia Primary Care Jayvier Burgher: Johny Drilling Other Clinician: Referring Jayion Schneck: Johny Drilling Treating Damonique Brunelle/Extender: Melburn Hake, HOYT Weeks in Treatment: 7 Active Problems Location of Pain Severity and Description of Pain Patient Has Paino No Site Locations Pain Management and Medication Current Pain Management: Electronic Signature(s) Signed:  08/04/2019 4:39:17 PM By: Army Melia Signed: 08/04/2019 4:57:32 PM By: Lorine Bears RCP, RRT, CHT Entered By: Lorine Bears on 08/04/2019 15:11:54 [name] ([id number]) -------------------------------------------------------------------------------- Patient/Caregiver Education Details Patient Name: [name] Date of Service: 08/04/2019 2:45 PM Medical Record Number: [id number] Patient Account Number: [id number] Date of Birth/Gender: Jun 09, 1966 (53 y.o. M) Treating RN: Army Melia Primary Care Physician: Johny Drilling Other Clinician: Referring Physician: Johny Drilling Treating Physician/Extender: Sharalyn Ink in Treatment: 7 Education Assessment Education Provided To: Patient Education Topics Provided Wound/Skin Impairment: Handouts: Caring for Your Ulcer Methods: Demonstration, Explain/Verbal Responses: State content correctly Electronic Signature(s) Signed: 08/04/2019 4:39:17 PM By: Army Melia Entered By: Army Melia on 08/04/2019 15:42:22 [name] ([id number]) -------------------------------------------------------------------------------- Wound Assessment Details Patient Name: [name] Date of Service: 08/04/2019 2:45 PM Medical Record Number: [id number] Patient Account Number: [id number] Date of Birth/Sex: 1965-10-20 (53 y.o. M) Treating RN: Army Melia Primary Care Anzley Dibbern: Johny Drilling Other Clinician: Referring Bernedette Auston: Johny Drilling Treating Mccartney Brucks/Extender: Melburn Hake, HOYT Weeks in Treatment: 7 Wound Status Wound Number: 2 Primary Etiology: Diabetic Wound/Ulcer of the Lower Extremity Wound Location: Left Toe Second Wound Status: Open Wounding Event: Gradually Appeared Comorbid Cataracts, Hypertension, Type II Diabetes Date Acquired: 05/30/2019 History: Weeks Of  Treatment: 7 Clustered Wound: No Pending Amputation On Presentation Photos Wound Measurements Length: (cm) 0.5 % Reduction Width: (cm) 0.3 %  Reduction Depth: (cm) 0.5 Epithelializ Area: (cm) 0.118 Tunneling: Volume: (cm) 0.059 Undermining in Area: 95.5% in Volume: 97.2% ation: None No : No Wound Description Classification: Grade 3 Foul Odor A Wound Margin: Distinct, outline attached Slough/Fibr Exudate Amount: Medium Exudate Type: Serosanguineous Exudate Color: red, brown fter Cleansing: No ino Yes Wound Bed Granulation Amount: Large (67-100%) Exposed Structure Granulation Quality: Red Fascia Exposed: No Necrotic Amount: Small (1-33%) Fat Layer (Subcutaneous Tissue) Exposed: Yes Necrotic Quality: Adherent Slough Tendon Exposed: No Muscle Exposed: No Joint Exposed: No Bone Exposed: No Treatment Notes [name], [name] ([id number]) Wound #2 (Left Toe Second) Notes prisma, conform and surgical shoe Electronic Signature(s) Signed: 08/04/2019 4:39:17 PM By: Army Melia Entered By: Army Melia on 08/04/2019 15:43:20 [name] ([id number]) -------------------------------------------------------------------------------- Vitals Details Patient Name: [name] Date of Service: 08/04/2019 2:45 PM Medical Record Number: [id number] Patient Account Number: [id number] Date of Birth/Sex: [birth year] (53 y.o. M) Treating RN: Army Melia Primary Care Seyed Heffley: Johny Drilling Other Clinician: Referring Shooter Tangen: Johny Drilling Treating Gittel Mccamish/Extender: Melburn Hake, HOYT Weeks in Treatment: 7 Vital Signs Time Taken: 15:12 Temperature (F): 98.0 Height (in): 75 Pulse (bpm): 74 Weight (lbs): 240 Respiratory Rate (breaths/min): 16 Body Mass Index (BMI): 30 Blood Pressure (mmHg): 150/86 Reference Range: 80 - 120 mg / dl Electronic Signature(s) Signed: 08/04/2019 4:57:32 PM By: Lorine Bears RCP, RRT, CHT Entered By: Lorine Bears on 08/04/2019 15:15:01

## 2019-08-11 ENCOUNTER — Ambulatory Visit: Payer: BC Managed Care – PPO | Admitting: Physician Assistant

## 2019-08-18 ENCOUNTER — Ambulatory Visit: Payer: BC Managed Care – PPO | Admitting: Physician Assistant

## 2019-10-18 NOTE — Telephone Encounter (Signed)
Taken care of

## 2020-07-05 ENCOUNTER — Ambulatory Visit
Admission: EM | Admit: 2020-07-05 | Discharge: 2020-07-05 | Disposition: A | Discharge status: Home / Self Care | Payer: BC Managed Care – PPO

## 2020-07-05 ENCOUNTER — Ambulatory Visit
Admission: RE | Admit: 2020-07-05 | Discharge: 2020-07-05 | Disposition: A | Discharge status: Home / Self Care | Payer: BC Managed Care – PPO | Source: Ambulatory Visit | Attending: Family Medicine | Admitting: Family Medicine

## 2020-07-05 ENCOUNTER — Other Ambulatory Visit: Payer: Self-pay | Admitting: Family Medicine

## 2020-07-05 ENCOUNTER — Other Ambulatory Visit: Payer: Self-pay

## 2020-07-05 ENCOUNTER — Ambulatory Visit
Admission: RE | Admit: 2020-07-05 | Discharge: 2020-07-05 | Disposition: A | Discharge status: Home / Self Care | Payer: BC Managed Care – PPO | Attending: Family Medicine | Admitting: Family Medicine

## 2020-07-05 DIAGNOSIS — B999 Unspecified infectious disease: Secondary | ICD-10-CM

## 2020-07-09 ENCOUNTER — Other Ambulatory Visit: Payer: Self-pay

## 2020-07-09 ENCOUNTER — Inpatient Hospital Stay
Admission: EM | Admit: 2020-07-09 | Discharge: 2020-07-13 | DRG: 617 | Disposition: A | Discharge status: Home / Self Care | Payer: BC Managed Care – PPO | Attending: Internal Medicine | Admitting: Internal Medicine

## 2020-07-09 ENCOUNTER — Inpatient Hospital Stay: Payer: BC Managed Care – PPO

## 2020-07-09 ENCOUNTER — Encounter: Payer: Self-pay | Admitting: Emergency Medicine

## 2020-07-09 DIAGNOSIS — Z89421 Acquired absence of other right toe(s): Secondary | ICD-10-CM

## 2020-07-09 DIAGNOSIS — E1142 Type 2 diabetes mellitus with diabetic polyneuropathy: Secondary | ICD-10-CM | POA: Diagnosis present

## 2020-07-09 DIAGNOSIS — E119 Type 2 diabetes mellitus without complications: Secondary | ICD-10-CM | POA: Diagnosis not present

## 2020-07-09 DIAGNOSIS — Z79899 Other long term (current) drug therapy: Secondary | ICD-10-CM | POA: Diagnosis not present

## 2020-07-09 DIAGNOSIS — M86171 Other acute osteomyelitis, right ankle and foot: Secondary | ICD-10-CM | POA: Diagnosis present

## 2020-07-09 DIAGNOSIS — I96 Gangrene, not elsewhere classified: Secondary | ICD-10-CM | POA: Diagnosis present

## 2020-07-09 DIAGNOSIS — E1169 Type 2 diabetes mellitus with other specified complication: Secondary | ICD-10-CM | POA: Diagnosis present

## 2020-07-09 DIAGNOSIS — E861 Hypovolemia: Secondary | ICD-10-CM | POA: Diagnosis not present

## 2020-07-09 DIAGNOSIS — Z20822 Contact with and (suspected) exposure to covid-19: Secondary | ICD-10-CM | POA: Diagnosis present

## 2020-07-09 DIAGNOSIS — Z8249 Family history of ischemic heart disease and other diseases of the circulatory system: Secondary | ICD-10-CM | POA: Diagnosis not present

## 2020-07-09 DIAGNOSIS — E1165 Type 2 diabetes mellitus with hyperglycemia: Secondary | ICD-10-CM | POA: Diagnosis present

## 2020-07-09 DIAGNOSIS — E871 Hypo-osmolality and hyponatremia: Secondary | ICD-10-CM | POA: Diagnosis not present

## 2020-07-09 DIAGNOSIS — E118 Type 2 diabetes mellitus with unspecified complications: Secondary | ICD-10-CM | POA: Diagnosis not present

## 2020-07-09 DIAGNOSIS — M869 Osteomyelitis, unspecified: Secondary | ICD-10-CM | POA: Diagnosis present

## 2020-07-09 DIAGNOSIS — E11621 Type 2 diabetes mellitus with foot ulcer: Secondary | ICD-10-CM | POA: Diagnosis present

## 2020-07-09 DIAGNOSIS — E785 Hyperlipidemia, unspecified: Secondary | ICD-10-CM | POA: Diagnosis present

## 2020-07-09 DIAGNOSIS — E1152 Type 2 diabetes mellitus with diabetic peripheral angiopathy with gangrene: Secondary | ICD-10-CM | POA: Diagnosis present

## 2020-07-09 DIAGNOSIS — Z794 Long term (current) use of insulin: Secondary | ICD-10-CM

## 2020-07-09 DIAGNOSIS — F17290 Nicotine dependence, other tobacco product, uncomplicated: Secondary | ICD-10-CM | POA: Diagnosis present

## 2020-07-09 DIAGNOSIS — I10 Essential (primary) hypertension: Secondary | ICD-10-CM | POA: Diagnosis present

## 2020-07-09 DIAGNOSIS — L97519 Non-pressure chronic ulcer of other part of right foot with unspecified severity: Secondary | ICD-10-CM | POA: Diagnosis present

## 2020-07-09 LAB — URINALYSIS, COMPLETE (UACMP) WITH MICROSCOPIC
Bacteria, UA: NONE SEEN
Bilirubin Urine: NEGATIVE
Glucose, UA: 150 mg/dL — AB
Ketones, ur: NEGATIVE mg/dL
Leukocytes,Ua: NEGATIVE
Nitrite: NEGATIVE
Protein, ur: NEGATIVE mg/dL
Specific Gravity, Urine: 1.017 (ref 1.005–1.030)
Squamous Epithelial / HPF: NONE SEEN (ref 0–5)
pH: 6 (ref 5.0–8.0)

## 2020-07-09 LAB — CBC WITH DIFFERENTIAL/PLATELET
Abs Immature Granulocytes: 0.03 10*3/uL (ref 0.00–0.07)
Basophils Absolute: 0 10*3/uL (ref 0.0–0.1)
Basophils Relative: 1 %
Eosinophils Absolute: 0.1 10*3/uL (ref 0.0–0.5)
Eosinophils Relative: 1 %
HCT: 43.1 % (ref 39.0–52.0)
Hemoglobin: 14.8 g/dL (ref 13.0–17.0)
Immature Granulocytes: 1 %
Lymphocytes Relative: 16 %
Lymphs Abs: 1 10*3/uL (ref 0.7–4.0)
MCH: 30.3 pg (ref 26.0–34.0)
MCHC: 34.3 g/dL (ref 30.0–36.0)
MCV: 88.1 fL (ref 80.0–100.0)
Monocytes Absolute: 0.6 10*3/uL (ref 0.1–1.0)
Monocytes Relative: 9 %
Neutro Abs: 4.4 10*3/uL (ref 1.7–7.7)
Neutrophils Relative %: 72 %
Platelets: 257 10*3/uL (ref 150–400)
RBC: 4.89 MIL/uL (ref 4.22–5.81)
RDW: 11.7 % (ref 11.5–15.5)
WBC: 6.1 10*3/uL (ref 4.0–10.5)
nRBC: 0 % (ref 0.0–0.2)

## 2020-07-09 LAB — COMPREHENSIVE METABOLIC PANEL
ALT: 23 U/L (ref 0–44)
AST: 23 U/L (ref 15–41)
Albumin: 3.8 g/dL (ref 3.5–5.0)
Alkaline Phosphatase: 96 U/L (ref 38–126)
Anion gap: 9 (ref 5–15)
BUN: 10 mg/dL (ref 6–20)
CO2: 25 mmol/L (ref 22–32)
Calcium: 11.1 mg/dL — ABNORMAL HIGH (ref 8.9–10.3)
Chloride: 103 mmol/L (ref 98–111)
Creatinine, Ser: 0.73 mg/dL (ref 0.61–1.24)
GFR, Estimated: >60 mL/min (ref >60)
Glucose, Bld: 182 mg/dL — ABNORMAL HIGH (ref 70–99)
Potassium: 4 mmol/L (ref 3.5–5.1)
Sodium: 137 mmol/L (ref 135–145)
Total Bilirubin: 1.1 mg/dL (ref 0.3–1.2)
Total Protein: 7.5 g/dL (ref 6.5–8.1)

## 2020-07-09 LAB — GLUCOSE, CAPILLARY: Glucose-Capillary: 243 mg/dL — ABNORMAL HIGH (ref 70–99)

## 2020-07-09 LAB — RESPIRATORY PANEL BY RT PCR (FLU A&B, COVID)
Influenza A by PCR: NEGATIVE
Influenza B by PCR: NEGATIVE
SARS Coronavirus 2 by RT PCR: NEGATIVE

## 2020-07-09 LAB — LACTIC ACID, PLASMA: Lactic Acid, Venous: 1.1 mmol/L (ref 0.5–1.9)

## 2020-07-09 MED ORDER — SODIUM CHLORIDE 0.9 % IV SOLN
1.0000 g | INTRAVENOUS | Status: DC
  Administered 2020-07-10: 1 g via INTRAVENOUS
  Filled 2020-07-09: qty 10

## 2020-07-09 MED ORDER — INSULIN ASPART 100 UNIT/ML ~~LOC~~ SOLN
0.0000 [IU] | Freq: Every day | SUBCUTANEOUS | Status: DC
  Administered 2020-07-09: 2 [IU] via SUBCUTANEOUS
  Administered 2020-07-11: 3 [IU] via SUBCUTANEOUS
  Filled 2020-07-09: qty 1

## 2020-07-09 MED ORDER — INSULIN ASPART 100 UNIT/ML ~~LOC~~ SOLN
0.0000 [IU] | Freq: Three times a day (TID) | SUBCUTANEOUS | Status: DC
  Administered 2020-07-10 (×2): 2 [IU] via SUBCUTANEOUS
  Administered 2020-07-10: 5 [IU] via SUBCUTANEOUS
  Administered 2020-07-11: 3 [IU] via SUBCUTANEOUS
  Administered 2020-07-11: 2 [IU] via SUBCUTANEOUS
  Administered 2020-07-12: 3 [IU] via SUBCUTANEOUS
  Administered 2020-07-12: 2 [IU] via SUBCUTANEOUS
  Administered 2020-07-12: 5 [IU] via SUBCUTANEOUS
  Administered 2020-07-13 (×2): 3 [IU] via SUBCUTANEOUS
  Filled 2020-07-09 (×11): qty 1

## 2020-07-09 MED ORDER — PIPERACILLIN-TAZOBACTAM 3.375 G IVPB 30 MIN
3.3750 g | Freq: Once | INTRAVENOUS | Status: AC
  Administered 2020-07-09: 3.375 g via INTRAVENOUS
  Filled 2020-07-09: qty 50

## 2020-07-09 MED ORDER — VANCOMYCIN HCL IN DEXTROSE 1-5 GM/200ML-% IV SOLN
1000.0000 mg | Freq: Once | INTRAVENOUS | Status: AC
  Administered 2020-07-09: 1000 mg via INTRAVENOUS
  Filled 2020-07-09: qty 200

## 2020-07-09 MED ORDER — GADOBUTROL 1 MMOL/ML IV SOLN
10.0000 mL | Freq: Once | INTRAVENOUS | Status: AC | PRN
  Administered 2020-07-09: 10 mL via INTRAVENOUS

## 2020-07-09 MED ORDER — VANCOMYCIN HCL 10 G IV SOLR: 2500.0000 mg | Freq: Once | INTRAVENOUS | Status: DC

## 2020-07-09 MED ORDER — VANCOMYCIN HCL IN DEXTROSE 1-5 GM/200ML-% IV SOLN
1000.0000 mg | Freq: Once | INTRAVENOUS | Status: AC
  Administered 2020-07-10: 1000 mg via INTRAVENOUS
  Filled 2020-07-09: qty 200

## 2020-07-09 NOTE — ED Notes (Signed)
PT in MRI at this time.

## 2020-07-09 NOTE — ED Notes (Signed)
MRI called regarding this pt, requesting to speak to MD. Ms. Tye [place] paging NP on call to get them in contact.

## 2020-07-09 NOTE — ED Notes (Signed)
Patient transported to MRI 

## 2020-07-09 NOTE — ED Notes (Signed)
Pt returned from MRI at this time, getting up to go to the restroom

## 2020-07-09 NOTE — Consult Note (Signed)
PHARMACY -  BRIEF ANTIBIOTIC NOTE   Pharmacy has received consult(s) for Vancomycin from an ED provider.  The patient's profile has been reviewed for ht/wt/allergies/indication/available labs.    One time order(s) placed for Vancomycin 1g x1 dose  Further antibiotics/pharmacy consults should be ordered by admitting physician if indicated.                       Thank you, Rowland Lathe 07/09/2020  9:05 PM

## 2020-07-09 NOTE — H&P (Signed)
History and Physical    [name] UMP:[id number] DOB: [birth year] DOA: 07/09/2020  PCP: Valera Castle, MD  Patient coming from: Home  I have personally briefly reviewed patient's old medical records in [place]  Chief Complaint: worsening right great toe wound  HPI: [name] is a 54 y.o. male with medical history significant for type 2 diabetes with peripheral neuropathy, PAD, hypertension, history of osteomyelitis s/p amputation of the right 5th toe who presents with concerns of worsening right great toe wound.  About 3 weeks ago he scraped the dorsal side of his right great toe and the wound gradually began to get worse.  His girlfriend who is a nurse has been helping him to do wound care.  He presented to his PCP about 5 days ago since he felt wound was going down to the bone and prescribed dicloxacillin.  No fever. No nausea, vomiting or diarrhea.   ED Course: Had temperature of 9F, hypertensive up to 190s. No leukocytosis. Elevated glucose of 182.  Right great toe x-ray suggests osteomyelitis of the medial distal aspect of the first proximal phalanx.  Review of Systems: Constitutional: No Weight Change, No Fever ENT/Mouth: No sore throat, No Rhinorrhea Eyes: No Eye Pain, No Vision Changes Cardiovascular: No Chest Pain, no SOB Respiratory: No Cough, No Sputum Gastrointestinal: No Nausea, No Vomiting, No Diarrhea,  Genitourinary: no Urinary Incontinence Musculoskeletal: No Arthralgias, No Myalgias Skin: No Skin Lesions, No Pruritus, Neuro: no Weakness, No Numbness Psych: No Anxiety/Panic, No Depression Heme/Lymph: No Bruising, No Bleeding   Past Medical History:  Diagnosis Date  . Diabetes ([place])   . Hypertension     Past Surgical History:  Procedure Laterality Date  . IRRIGATION AND DEBRIDEMENT ABSCESS Right 10/31/2018   Procedure: IRRIGATION AND DEBRIDEMENT ABSCESS;  Surgeon: Samara Deist, DPM;  Location: [facility] ORS;  Service: Podiatry;  Laterality:  Right;  . IRRIGATION AND DEBRIDEMENT FOOT Right 11/02/2018   Procedure: IRRIGATION AND DEBRIDEMENT FOOT;  Surgeon: Sharlotte Alamo, DPM;  Location: [facility] ORS;  Service: Podiatry;  Laterality: Right;     reports that he has been smoking cigars. He has never used smokeless tobacco. He reports current alcohol use. He reports that he does not use drugs. Social History  No Known Allergies  Family History  Problem Relation Age of Onset  . CAD Father   . CAD Brother      Prior to Admission medications   Medication Sig Start Date End Date Taking? Authorizing Provider  dicloxacillin (DYNAPEN) 500 MG capsule Take 1 capsule by mouth every 6 (six) hours. For 10 days 07/04/20 07/14/20 Yes [provider]  amLODipine (NORVASC) 10 MG tablet Take 1 tablet (10 mg total) by mouth daily. 11/05/18   Mayo, Pete Pelt, MD  amoxicillin-clavulanate (AUGMENTIN) 875-125 MG tablet Take 1 tablet by mouth 2 (two) times daily. 12/02/18   Tsosie Billing, MD  atorvastatin (LIPITOR) 40 MG tablet Take 40 mg by mouth daily. 11/10/18 11/10/19  [provider]  GLIPIZIDE XL 5 MG 24 hr tablet Take 1 tablet by mouth daily. 02/08/18   [provider]  ibuprofen (ADVIL,MOTRIN) 200 MG tablet Take 200-400 mg by mouth every 6 (six) hours as needed.    [provider]  lisinopril (PRINIVIL,ZESTRIL) 20 MG tablet Take 1 tablet (20 mg total) by mouth daily. 11/05/18   Mayo, Pete Pelt, MD  losartan (COZAAR) 50 MG tablet Take 50 mg by mouth daily. 04/27/20   [provider]  omega-3 acid ethyl esters (LOVAZA)  1 g capsule Take 1 g by mouth every other day.    [provider]  oxyCODONE (OXY IR/ROXICODONE) 5 MG immediate release tablet Take 1 tablet (5 mg total) by mouth every 4 (four) hours as needed for moderate pain. 11/04/18   MayoPete Pelt, MD    Physical Exam: Vitals:   07/09/20 1247 07/09/20 1248 07/09/20 1842 07/09/20 2220  BP: (!) 177/100  (!) 193/114 (!) 146/81  Pulse: 88  87 87    Resp: 20  20 18   Temp: 98.4 F (36.9 C)  99.9 F (37.7 C)   TempSrc: Oral  Oral   SpO2: 98%  100% 99%  Weight:  111.1 kg    Height:  6\' 3"  (1.905 m)      Constitutional: NAD, calm, comfortable,obese male sitting upright in bed Vitals:   07/09/20 1247 07/09/20 1248 07/09/20 1842 07/09/20 2220  BP: (!) 177/100  (!) 193/114 (!) 146/81  Pulse: 88  87 87  Resp: 20  20 18   Temp: 98.4 F (36.9 C)  99.9 F (37.7 C)   TempSrc: Oral  Oral   SpO2: 98%  100% 99%  Weight:  111.1 kg    Height:  6\' 3"  (1.905 m)     Eyes: PERRL, lids and conjunctivae normal ENMT: Mucous membranes are moist.  Neck: normal, supple Respiratory: clear to auscultation bilaterally, no wheezing, no crackles. Normal respiratory effort.  Cardiovascular: Regular rate and rhythm, no murmurs / rubs / gallops. No extremity edema.  Abdomen: no tenderness, no masses palpated. . Bowel sounds positive.  Musculoskeletal: no clubbing / cyanosis. No joint deformity upper and lower extremities. Good ROM, no contractures. Normal muscle tone.  Skin: purulent stage 4 wound on dorsal PIP of the right great toe with surround erythema       Neurologic: CN 2-12 grossly intact. Decrease sensation of bilateral foot. Strength 5/5 in all 4.  Psychiatric: Normal judgment and insight. Alert and oriented x 3. Normal mood.    Labs on Admission: I have personally reviewed following labs and imaging studies  CBC: Recent Labs  Lab 07/09/20 1258  WBC 6.1  NEUTROABS 4.4  HGB 14.8  HCT 43.1  MCV 88.1  PLT 810   Basic Metabolic Panel: Recent Labs  Lab 07/09/20 1258  NA 137  K 4.0  CL 103  CO2 25  GLUCOSE 182*  BUN 10  CREATININE 0.73  CALCIUM 11.1*   GFR: Estimated Creatinine Clearance: 142 mL/min (by C-G formula based on SCr of 0.73 mg/dL). Liver Function Tests: Recent Labs  Lab 07/09/20 1258  AST 23  ALT 23  ALKPHOS 96  BILITOT 1.1  PROT 7.5  ALBUMIN 3.8   No results for input(s): LIPASE, AMYLASE in the last  168 hours. No results for input(s): AMMONIA in the last 168 hours. Coagulation Profile: No results for input(s): INR, PROTIME in the last 168 hours. Cardiac Enzymes: No results for input(s): CKTOTAL, CKMB, CKMBINDEX, TROPONINI in the last 168 hours. BNP (last 3 results) No results for input(s): PROBNP in the last 8760 hours. HbA1C: No results for input(s): HGBA1C in the last 72 hours. CBG: No results for input(s): GLUCAP in the last 168 hours. Lipid Profile: No results for input(s): CHOL, HDL, LDLCALC, TRIG, CHOLHDL, LDLDIRECT in the last 72 hours. Thyroid Function Tests: No results for input(s): TSH, T4TOTAL, FREET4, T3FREE, THYROIDAB in the last 72 hours. Anemia Panel: No results for input(s): VITAMINB12, FOLATE, FERRITIN, TIBC, IRON, RETICCTPCT in the last 72 hours. Urine analysis:  Component Value Date/Time   COLORURINE YELLOW (A) 07/09/2020 2155   APPEARANCEUR CLEAR (A) 07/09/2020 2155   LABSPEC 1.017 07/09/2020 2155   PHURINE 6.0 07/09/2020 2155   GLUCOSEU 150 (A) 07/09/2020 2155   HGBUR SMALL (A) 07/09/2020 2155   BILIRUBINUR NEGATIVE 07/09/2020 2155   [place] 07/09/2020 2155   PROTEINUR NEGATIVE 07/09/2020 2155   NITRITE NEGATIVE 07/09/2020 2155   LEUKOCYTESUR NEGATIVE 07/09/2020 2155    Radiological Exams on Admission: No results found.    Assessment/Plan  Suspected osteomyelitis of the right great toe with history of type 2 diabetes Obtain MRI to assess osteomyelitis Started on IV vancomycin and Zosyn in the ED. Will continue IV vancomycin and switch to IV Rocephin Daily wound care  Type 2 diabetes Last hemoglobin A1c in May 2021 of 6.5 Obtain new hemoglobin A1c Sensitive sliding scale  Hypertension Continue antihypertensives  DVT prophylaxis:SCDs Code Status: Full Family Communication: Plan discussed with patient at bedside  disposition Plan: Home with at least 2 midnight stays  Consults called:  Admission status: inpatient  Status  is: Inpatient  Remains inpatient appropriate because:Inpatient level of care appropriate due to severity of illness   Dispo: The patient is from: Home              Anticipated d/c is to: Home              Anticipated d/c date is: 3 days              Patient currently is not medically stable to d/c.         Orene Desanctis DO [facility]   If 7PM-7AM, please contact night-coverage [email or web address]   07/09/2020, 11:04 PM

## 2020-07-09 NOTE — ED Provider Notes (Signed)
[facility] Emergency Department Provider Note    First MD Initiated Contact with Patient 07/09/20 2025     (approximate)  I have reviewed the triage vital signs and the nursing notes.   HISTORY  Chief Complaint Wound Infection    HPI Kejon Feild is a 54 y.o. male with a history of diabetes hypertension smoking history as well as PAD presents to the ER for evaluation of worsening ulceration of his right great toe redness failing outpatient antibiotics and having x-ray from outpatient showing evidence of probable osteo-.  States that has continued to worsen over the past few weeks.  Is not having fevers or chills.  Has had issues with poor circulation in the past and has had partial toe resection secondary to osteo-.  Was given referral to wound clinic but not able to get in the clinic until November.  Does not recall any specific injury.    Past Medical History:  Diagnosis Date  . Diabetes ([place])   . Hypertension    Family History  Problem Relation Age of Onset  . CAD Father   . CAD Brother    Past Surgical History:  Procedure Laterality Date  . IRRIGATION AND DEBRIDEMENT ABSCESS Right 10/31/2018   Procedure: IRRIGATION AND DEBRIDEMENT ABSCESS;  Surgeon: Samara Deist, DPM;  Location: [facility] ORS;  Service: Podiatry;  Laterality: Right;  . IRRIGATION AND DEBRIDEMENT FOOT Right 11/02/2018   Procedure: IRRIGATION AND DEBRIDEMENT FOOT;  Surgeon: Sharlotte Alamo, DPM;  Location: [facility] ORS;  Service: Podiatry;  Laterality: Right;   Patient Active Problem List   Diagnosis Date Noted  . Osteomyelitis ([place]) 10/30/2018  . Dog bite 04/13/2018      Prior to Admission medications   Medication Sig Start Date End Date Taking? Authorizing Provider  dicloxacillin (DYNAPEN) 500 MG capsule Take 1 capsule by mouth every 6 (six) hours. For 10 days 07/04/20 07/14/20 Yes [provider]  amLODipine (NORVASC) 10 MG tablet Take 1 tablet (10 mg total) by mouth daily.  11/05/18   Mayo, Pete Pelt, MD  amoxicillin-clavulanate (AUGMENTIN) 875-125 MG tablet Take 1 tablet by mouth 2 (two) times daily. 12/02/18   Tsosie Billing, MD  atorvastatin (LIPITOR) 40 MG tablet Take 40 mg by mouth daily. 11/10/18 11/10/19  [provider]  GLIPIZIDE XL 5 MG 24 hr tablet Take 1 tablet by mouth daily. 02/08/18   [provider]  ibuprofen (ADVIL,MOTRIN) 200 MG tablet Take 200-400 mg by mouth every 6 (six) hours as needed.    [provider]  lisinopril (PRINIVIL,ZESTRIL) 20 MG tablet Take 1 tablet (20 mg total) by mouth daily. 11/05/18   Mayo, Pete Pelt, MD  losartan (COZAAR) 50 MG tablet Take 50 mg by mouth daily. 04/27/20   [provider]  omega-3 acid ethyl esters (LOVAZA) 1 g capsule Take 1 g by mouth every other day.    [provider]  oxyCODONE (OXY IR/ROXICODONE) 5 MG immediate release tablet Take 1 tablet (5 mg total) by mouth every 4 (four) hours as needed for moderate pain. 11/04/18   Mayo, Pete Pelt, MD    Allergies Patient has no known allergies.    Social History Social History   Tobacco Use  . Smoking status: Light Tobacco Smoker    Types: Cigars  . Smokeless tobacco: Never Used  Vaping Use  . Vaping Use: Never used  Substance Use Topics  . Alcohol use: Yes    Comment: "too much"   . Drug use: Never  Review of Systems Patient denies headaches, rhinorrhea, blurry vision, numbness, shortness of breath, chest pain, edema, cough, abdominal pain, nausea, vomiting, diarrhea, dysuria, fevers, rashes or hallucinations unless otherwise stated above in HPI. ____________________________________________   PHYSICAL EXAM:  VITAL SIGNS: Vitals:   07/09/20 1247 07/09/20 1842  BP: (!) 177/100 (!) 193/114  Pulse: 88 87  Resp: 20 20  Temp: 98.4 F (36.9 C) 99.9 F (37.7 C)  SpO2: 98% 100%    Constitutional: Alert and oriented.  Eyes: Conjunctivae are normal.  Head: Atraumatic. Nose: No  congestion/rhinnorhea. Mouth/Throat: Mucous membranes are moist.   Neck: No stridor. Painless ROM.  Cardiovascular: Normal rate, regular rhythm. Grossly normal heart sounds.  Good peripheral circulation. Respiratory: Normal respiratory effort.  No retractions. Lungs CTAB. Gastrointestinal: Soft and nontender. No distention. No abdominal bruits. No CVA tenderness. Genitourinary:  Musculoskeletal: Right foot is swollen and erythematous.  Warm to touch.  Has large 4cm ulceration on the dorsal aspect of the right toe.  Ulceration does appear to extend beyond joint capsule of the interphalangeal space with foul-smelling serous drainage dorsal aspect of the toe and nailbed appears dusky but the volar aspect is pink and does have some cap refill..  No joint effusions. Neurologic:  Normal speech and language. No gross focal neurologic deficits are appreciated. No facial droop Skin:  Skin is warm, dry and intact. No rash noted. Psychiatric: Mood and affect are normal. Speech and behavior are normal.  ____________________________________________   LABS (all labs ordered are listed, but only abnormal results are displayed)  Results for orders placed or performed during the hospital encounter of 07/09/20 (from the past 24 hour(s))  Lactic acid, plasma     Status: None   Collection Time: 07/09/20 12:58 PM  Result Value Ref Range   Lactic Acid, Venous 1.1 0.5 - 1.9 mmol/L  Comprehensive metabolic panel     Status: Abnormal   Collection Time: 07/09/20 12:58 PM  Result Value Ref Range   Sodium 137 135 - 145 mmol/L   Potassium 4.0 3.5 - 5.1 mmol/L   Chloride 103 98 - 111 mmol/L   CO2 25 22 - 32 mmol/L   Glucose, Bld 182 (H) 70 - 99 mg/dL   BUN 10 6 - 20 mg/dL   Creatinine, Ser 0.73 0.61 - 1.24 mg/dL   Calcium 11.1 (H) 8.9 - 10.3 mg/dL   Total Protein 7.5 6.5 - 8.1 g/dL   Albumin 3.8 3.5 - 5.0 g/dL   AST 23 15 - 41 U/L   ALT 23 0 - 44 U/L   Alkaline Phosphatase 96 38 - 126 U/L   Total Bilirubin  1.1 0.3 - 1.2 mg/dL   GFR, Estimated >60 >60 mL/min   Anion gap 9 5 - 15  CBC with Differential     Status: None   Collection Time: 07/09/20 12:58 PM  Result Value Ref Range   WBC 6.1 4.0 - 10.5 K/uL   RBC 4.89 4.22 - 5.81 MIL/uL   Hemoglobin 14.8 13.0 - 17.0 g/dL   HCT 43.1 39 - 52 %   MCV 88.1 80.0 - 100.0 fL   MCH 30.3 26.0 - 34.0 pg   MCHC 34.3 30.0 - 36.0 g/dL   RDW 11.7 11.5 - 15.5 %   Platelets 257 150 - 400 K/uL   nRBC 0.0 0.0 - 0.2 %   Neutrophils Relative % 72 %   Neutro Abs 4.4 1.7 - 7.7 K/uL   Lymphocytes Relative 16 %   Lymphs Abs 1.0  0.7 - 4.0 K/uL   Monocytes Relative 9 %   Monocytes Absolute 0.6 0.1 - 1.0 K/uL   Eosinophils Relative 1 %   Eosinophils Absolute 0.1 0 - 0 K/uL   Basophils Relative 1 %   Basophils Absolute 0.0 0 - 0 K/uL   Immature Granulocytes 1 %   Abs Immature Granulocytes 0.03 0.00 - 0.07 K/uL   ____________________________________________ ____________________________________________  RADIOLOGY   ____________________________________________   PROCEDURES  Procedure(s) performed:  Procedures    Critical Care performed: no ____________________________________________   INITIAL IMPRESSION / ASSESSMENT AND PLAN / ED COURSE  Pertinent labs & imaging results that were available during my care of the patient were reviewed by me and considered in my medical decision making (see chart for details).   DDX: Abscess, cellulitis, diabetic foot ulcer, osteomyelitis  Debra Calabretta is a 54 y.o. who presents to the ED with evidence of significant ulceration of the right great toe with exposed bone underneath and surrounding cellulitis and drainage.  X-ray from few days ago showing evidence of early osteo-.  Given pictures shown by the patient on his phone seems to clearly have gotten worse in the past several days despite antibiotics.  He is not meeting any septic criteria with normal white count right now normal lactate but given the extent of his  wound concern for overlying cellulitis and infection with diabetes and PAD do feel he will require hospitalization for IV antibiotics and consultation.     The patient was evaluated in Emergency Department today for the symptoms described in the history of present illness. He/she was evaluated in the context of the global COVID-19 pandemic, which necessitated consideration that the patient might be at risk for infection with the SARS-CoV-2 virus that causes COVID-19. Institutional protocols and algorithms that pertain to the evaluation of patients at risk for COVID-19 are in a state of rapid change based on information released by regulatory bodies including the CDC and federal and state organizations. These policies and algorithms were followed during the patient's care in the ED.  As part of my medical decision making, I reviewed the following data within the [place] notes reviewed and incorporated, Labs reviewed, notes from prior ED visits and [place] Controlled Substance Database   ____________________________________________   FINAL CLINICAL IMPRESSION(S) / ED DIAGNOSES  Final diagnoses:  Osteomyelitis of right foot, unspecified type ([place])      NEW MEDICATIONS STARTED DURING THIS VISIT:  New Prescriptions   No medications on file     Note:  This document was prepared using Dragon voice recognition software and may include unintentional dictation errors.    Merlyn Lot, MD 07/09/20 2129

## 2020-07-09 NOTE — ED Triage Notes (Signed)
Pt presents to ED via POV with c/o wound to R great toe, states had X-ray done at PCP, X-ray results from PCP suggest possible osteo. Pt states has been on abx for wound infection x 6 days. Pt states hx of type 2 DM. Pt A& x4, ambulatory without difficulty at this time.

## 2020-07-10 DIAGNOSIS — I10 Essential (primary) hypertension: Secondary | ICD-10-CM | POA: Diagnosis not present

## 2020-07-10 DIAGNOSIS — E119 Type 2 diabetes mellitus without complications: Secondary | ICD-10-CM

## 2020-07-10 DIAGNOSIS — M869 Osteomyelitis, unspecified: Secondary | ICD-10-CM

## 2020-07-10 LAB — GLUCOSE, CAPILLARY
Glucose-Capillary: 172 mg/dL — ABNORMAL HIGH (ref 70–99)
Glucose-Capillary: 261 mg/dL — ABNORMAL HIGH (ref 70–99)
Glucose-Capillary: 166 mg/dL — ABNORMAL HIGH (ref 70–99)
Glucose-Capillary: 187 mg/dL — ABNORMAL HIGH (ref 70–99)

## 2020-07-10 LAB — BASIC METABOLIC PANEL
Anion gap: 8 (ref 5–15)
BUN: 9 mg/dL (ref 6–20)
CO2: 25 mmol/L (ref 22–32)
Calcium: 11.4 mg/dL — ABNORMAL HIGH (ref 8.9–10.3)
Chloride: 101 mmol/L (ref 98–111)
Creatinine, Ser: 0.71 mg/dL (ref 0.61–1.24)
GFR, Estimated: >60 mL/min (ref >60)
Glucose, Bld: 141 mg/dL — ABNORMAL HIGH (ref 70–99)
Potassium: 4.1 mmol/L (ref 3.5–5.1)
Sodium: 134 mmol/L — ABNORMAL LOW (ref 135–145)

## 2020-07-10 LAB — SURGICAL PCR SCREEN
MRSA, PCR: NEGATIVE
Staphylococcus aureus: NEGATIVE

## 2020-07-10 LAB — HIV ANTIBODY (ROUTINE TESTING W REFLEX): HIV Screen 4th Generation wRfx: NONREACTIVE

## 2020-07-10 LAB — CBC
HCT: 43.6 % (ref 39.0–52.0)
Hemoglobin: 14.7 g/dL (ref 13.0–17.0)
MCH: 29.3 pg (ref 26.0–34.0)
MCHC: 33.7 g/dL (ref 30.0–36.0)
MCV: 86.9 fL (ref 80.0–100.0)
Platelets: 226 10*3/uL (ref 150–400)
RBC: 5.02 MIL/uL (ref 4.22–5.81)
RDW: 11.7 % (ref 11.5–15.5)
WBC: 5.6 10*3/uL (ref 4.0–10.5)
nRBC: 0 % (ref 0.0–0.2)

## 2020-07-10 LAB — HEMOGLOBIN A1C
Hgb A1c MFr Bld: 6.7 % — ABNORMAL HIGH (ref 4.8–5.6)
Mean Plasma Glucose: 145.59 mg/dL

## 2020-07-10 MED ORDER — CHLORHEXIDINE GLUCONATE 4 % EX LIQD
60.0000 mL | Freq: Once | CUTANEOUS | Status: AC
  Administered 2020-07-11: 4 via TOPICAL

## 2020-07-10 MED ORDER — ATORVASTATIN CALCIUM 20 MG PO TABS
40.0000 mg | ORAL_TABLET | Freq: Every day | ORAL | Status: DC
  Administered 2020-07-10 – 2020-07-12 (×3): 40 mg via ORAL
  Filled 2020-07-10 (×3): qty 2

## 2020-07-10 MED ORDER — LOSARTAN POTASSIUM 50 MG PO TABS
50.0000 mg | ORAL_TABLET | Freq: Every day | ORAL | Status: DC
  Administered 2020-07-10 – 2020-07-13 (×3): 50 mg via ORAL
  Filled 2020-07-10 (×5): qty 1

## 2020-07-10 MED ORDER — SODIUM CHLORIDE 0.9 % IV SOLN
2.0000 g | INTRAVENOUS | Status: DC
  Administered 2020-07-11: 2 g via INTRAVENOUS
  Filled 2020-07-10 (×2): qty 20

## 2020-07-10 MED ORDER — POVIDONE-IODINE 10 % EX SWAB
2.0000 "application " | Freq: Once | CUTANEOUS | Status: DC
  Filled 2020-07-10: qty 2

## 2020-07-10 MED ORDER — INFLUENZA VAC SPLIT QUAD 0.5 ML IM SUSY: 0.5000 mL | PREFILLED_SYRINGE | INTRAMUSCULAR | Status: DC

## 2020-07-10 MED ORDER — CHLORHEXIDINE GLUCONATE CLOTH 2 % EX PADS
6.0000 | MEDICATED_PAD | Freq: Every day | CUTANEOUS | Status: DC
  Administered 2020-07-11 – 2020-07-12 (×2): 6 via TOPICAL

## 2020-07-10 MED ORDER — AMLODIPINE BESYLATE 10 MG PO TABS
10.0000 mg | ORAL_TABLET | Freq: Every day | ORAL | Status: DC
  Administered 2020-07-10 – 2020-07-13 (×3): 10 mg via ORAL
  Filled 2020-07-10 (×4): qty 1

## 2020-07-10 MED ORDER — OXYCODONE HCL 5 MG PO TABS
5.0000 mg | ORAL_TABLET | ORAL | Status: DC | PRN
  Administered 2020-07-10: 5 mg via ORAL
  Filled 2020-07-10: qty 1

## 2020-07-10 MED ORDER — SODIUM CHLORIDE 0.9 % IV SOLN
1.0000 g | Freq: Once | INTRAVENOUS | Status: AC
  Administered 2020-07-10: 1 g via INTRAVENOUS
  Filled 2020-07-10: qty 10

## 2020-07-10 MED ORDER — VANCOMYCIN HCL IN DEXTROSE 1-5 GM/200ML-% IV SOLN
1000.0000 mg | Freq: Two times a day (BID) | INTRAVENOUS | Status: DC
  Administered 2020-07-10 – 2020-07-11 (×4): 1000 mg via INTRAVENOUS
  Filled 2020-07-10 (×5): qty 200

## 2020-07-10 NOTE — Consult Note (Signed)
ORTHOPAEDIC CONSULTATION  REQUESTING PHYSICIAN: Ezekiel Slocumb, DO  Chief Complaint: Ulcer right great toe  HPI: [name] is a 54 y.o. male who complains of  Worsening ulcer right great toe.  Seen by wound center recently and sent to due to extent of infection.  Xrays and MRI consistent with osteomyelitis.  Hx of DM with neuropathy. S/P right 5th ray amputation.  Past Medical History:  Diagnosis Date  . Diabetes ([place])   . Hypertension    Past Surgical History:  Procedure Laterality Date  . IRRIGATION AND DEBRIDEMENT ABSCESS Right 10/31/2018   Procedure: IRRIGATION AND DEBRIDEMENT ABSCESS;  Surgeon: Samara Deist, DPM;  Location: [facility] ORS;  Service: Podiatry;  Laterality: Right;  . IRRIGATION AND DEBRIDEMENT FOOT Right 11/02/2018   Procedure: IRRIGATION AND DEBRIDEMENT FOOT;  Surgeon: Sharlotte Alamo, DPM;  Location: [facility] ORS;  Service: Podiatry;  Laterality: Right;   Social History   Socioeconomic History  . Marital status: Single    Spouse name: Not on file  . Number of children: Not on file  . Years of education: Not on file  . Highest education level: Not on file  Occupational History  . Not on file  Tobacco Use  . Smoking status: Light Tobacco Smoker    Types: Cigars  . Smokeless tobacco: Never Used  Vaping Use  . Vaping Use: Never used  Substance and Sexual Activity  . Alcohol use: Yes    Comment: "too much"   . Drug use: Never  . Sexual activity: Yes  Other Topics Concern  . Not on file  Social History Narrative  . Not on file   Social Determinants of Health   Financial Resource Strain:   . Difficulty of Paying Living Expenses: Not on file  Food Insecurity:   . Worried About Charity fundraiser in the Last Year: Not on file  . Ran Out of Food in the Last Year: Not on file  Transportation Needs:   . Lack of Transportation (Medical): Not on file  . Lack of Transportation (Non-Medical): Not on file  Physical Activity:   . Days of Exercise per Week: Not on  file  . Minutes of Exercise per Session: Not on file  Stress:   . Feeling of Stress : Not on file  Social Connections:   . Frequency of Communication with Friends and Family: Not on file  . Frequency of Social Gatherings with Friends and Family: Not on file  . Attends Religious Services: Not on file  . Active Member of Clubs or Organizations: Not on file  . Attends Archivist Meetings: Not on file  . Marital Status: Not on file   Family History  Problem Relation Age of Onset  . CAD Father   . CAD Brother    No Known Allergies Prior to Admission medications   Medication Sig Start Date End Date Taking? Authorizing Provider  atorvastatin (LIPITOR) 40 MG tablet Take 40 mg by mouth daily. 11/10/18 07/09/20 Yes [provider]  dicloxacillin (DYNAPEN) 500 MG capsule Take 1 capsule by mouth every 6 (six) hours. For 10 days 07/04/20 07/14/20 Yes [provider]  GLIPIZIDE XL 10 MG 24 hr tablet Take 2 tablets by mouth daily.  02/08/18  Yes [provider]  losartan (COZAAR) 50 MG tablet Take 50 mg by mouth daily. 04/27/20  Yes [provider]  amLODipine (NORVASC) 10 MG tablet Take 1 tablet (10 mg total) by mouth daily. Patient not taking: Reported on 07/09/2020 11/05/18  Mayo, Pete Pelt, MD  amoxicillin-clavulanate (AUGMENTIN) 875-125 MG tablet Take 1 tablet by mouth 2 (two) times daily. Patient not taking: Reported on 07/09/2020 12/02/18   Tsosie Billing, MD  ibuprofen (ADVIL,MOTRIN) 200 MG tablet Take 200-400 mg by mouth every 6 (six) hours as needed. Patient not taking: Reported on 07/09/2020    [provider]  lisinopril (PRINIVIL,ZESTRIL) 20 MG tablet Take 1 tablet (20 mg total) by mouth daily. Patient not taking: Reported on 07/09/2020 11/05/18   Mayo, Pete Pelt, MD  omega-3 acid ethyl esters (LOVAZA) 1 g capsule Take 1 g by mouth every other day. Patient not taking: Reported on 07/09/2020    [provider]  oxyCODONE  (OXY IR/ROXICODONE) 5 MG immediate release tablet Take 1 tablet (5 mg total) by mouth every 4 (four) hours as needed for moderate pain. Patient not taking: Reported on 07/09/2020 11/04/18   Sela Hua, MD   MR FOOT RIGHT W WO CONTRAST  Result Date: 07/10/2020 CLINICAL DATA:  Skin ulceration on the plantar surface of the right great toe in a diabetic patient. Question osteomyelitis. EXAM: MRI OF THE RIGHT FOREFOOT WITHOUT AND WITH CONTRAST TECHNIQUE: Multiplanar, multisequence MR imaging of the right forefoot was performed before and after the administration of intravenous contrast. CONTRAST:  10 mL GADAVIST IV SOLN COMPARISON:  Plain films right great toe 07/05/2020. FINDINGS: Bones/Joint/Cartilage There is intense marrow edema and enhancement throughout the great toe consistent with osteomyelitis. Bony destructive change is seen about the IP joint and there is a small IP joint effusion. No other evidence of osteomyelitis is identified. No other evidence of osteomyelitis or septic joint is identified. The head of the proximal phalanx of the great toe appears exposed to air. 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 There is also midfoot osteoarthritis which appears mild-to-moderate in degree and worst at the second tarsometatarsal joint. Ligaments Intact. Muscles and Tendons There is atrophy of intrinsic musculature the foot. No intramuscular fluid collection. No tendon tear. Soft tissues Bandaging is present about the great toe. There is soft tissue edema and enhancement in the great toe consistent with cellulitis. IMPRESSION: Osteomyelitis throughout the great toe with bony destructive change about the IP joint compatible with septic joint. Negative for myositis or abscess. First MTP and midfoot osteoarthritis. Electronically Signed   By: Inge Rise M.D.   On: 07/10/2020 07:12    Positive ROS: All other systems have been reviewed and were otherwise negative with the  exception of those mentioned in the HPI and as above.  12 point ROS was performed.  Physical Exam: General: Alert and oriented.  No apparent distress.  Vascular:  Left foot:Dorsalis Pedis:  present Posterior Tibial:  present  Right foot: Dorsalis Pedis:  present Posterior Tibial:  present  Neuro:absent protective sensation  Derm:Large open ulceration dorsal IPJ right great toe with exposed joint and fragmented bone.  Ortho/MS: Diffuse edema right foot.  S/p right 5th ray amputation   Assessment: Osteomyelitis right great toe.  DM with neuropathy.  Plan: Will plan for amputation of right great toe and d/w pt in detail r/b/a/c.  Likely perform tomorrow pm.  Will place orders for surgery.      Elesa Hacker, DPM Cell [phone]   07/10/2020 9:43 AM

## 2020-07-10 NOTE — ED Notes (Addendum)
Pt sitting up on side of stretcher; states he is feeling ok and is surprised he has been able to sleep in the hallway. Offered water and restroom. Pt ambulated to restroom with steady gait. No distress noted at this time.

## 2020-07-10 NOTE — Consult Note (Signed)
NAME: [name]  DOB: 20-Jan-1966  MRN: [id number]  Date/Time: 07/10/2020 4:43 PM  REQUESTING PROVIDER: Dr.Griffith Subjective:  REASON FOR CONSULT: rt toe infection ? [name] is a 54 y.o. fmale with a history of diabetes mellitus, hypertension . 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 is admitted with rt great toe infection Pt says he had an ulcer on the rt great toe for a few weeks which got worse and he went to his PCP on 07/04/20 who gave him dicloxacillin and then took Xray and as there was evidence of osteo he was asked to go to the hospital In the ED vitals were 98.4, BP 177/100, HR 88, sats 98% WBC 6.1, HB 14.8, PLT 257, cr 0.73 MRI rt foot showed Osteomyelitis throughout the great toe with bony destructive change about the IP joint compatible with septic joint He was started on vanco and ceftriaxone Was seen by podiatrist and will be taken for surgery tomorrow He continues to smoke cigars and drink alcohol   Pt had DFI with severe necrotizing infection of the rt foot in feb 2020 and underwent 5th toe ray excsiion and was treated with 4 weeks of Iv unasyn followed by Po for MSSA, group c and anerobic infection  Past Medical History:  Diagnosis Date  . Diabetes ([place])   . Hypertension     Past Surgical History:  Procedure Laterality Date  . IRRIGATION AND DEBRIDEMENT ABSCESS Right 10/31/2018   Procedure: IRRIGATION AND DEBRIDEMENT ABSCESS;  Surgeon: Samara Deist, DPM;  Location: [facility] OR[facility];  Service: Podiatry;  Laterality: Right;  . IRRIGATION AND DEBRIDEMENT FOOT Right 11/02/2018   Procedure: IRRIGATION AND DEBRIDEMENT FOOT;  Surgeon: Sharlotte Alamo, DPM;  Location: [facility];  Service: Podiatry;  Laterality: Right;    Social History   Socioeconomic History  . Marital status: Single    Spouse name: Not on file  . Number of children: Not on file  . Years of education: Not on file  . Highest education level: Not on file  Occupational History  . Not on file  Tobacco  Use  . Smoking status: Light Tobacco Smoker    Types: Cigars  . Smokeless tobacco: Never Used  Vaping Use  . Vaping Use: Never used  Substance and Sexual Activity  . Alcohol use: Yes    Comment: "too much"   . Drug use: Never  . Sexual activity: Yes  Other Topics Concern  . Not on file  Social History Narrative  . Not on file   Social Determinants of Health   Financial Resource Strain:   . Difficulty of Paying Living Expenses: Not on file  Food Insecurity:   . Worried About Charity fundraiser in the Last Year: Not on file  . Ran Out of Food in the Last Year: Not on file  Transportation Needs:   . Lack of Transportation (Medical): Not on file  . Lack of Transportation (Non-Medical): Not on file  Physical Activity:   . Days of Exercise per Week: Not on file  . Minutes of Exercise per Session: Not on file  Stress:   . Feeling of Stress : Not on file  Social Connections:   . Frequency of Communication with Friends and Family: Not on file  . Frequency of Social Gatherings with Friends and Family: Not on file  . Attends Religious Services: Not on file  . Active Member of Clubs or Organizations: Not on file  . Attends Archivist Meetings: Not on  file  . Marital Status: Not on file  Intimate Partner Violence:   . Fear of Current or Ex-Partner: Not on file  . Emotionally Abused: Not on file  . Physically Abused: Not on file  . Sexually Abused: Not on file    Family History  Problem Relation Age of Onset  . CAD Father   . CAD Brother    No Known Allergies  ? Current Facility-Administered Medications  Medication Dose Route Frequency Provider Last Rate Last Admin  . amLODipine (NORVASC) tablet 10 mg  10 mg Oral Daily Nicole Kindred A, DO      . atorvastatin (LIPITOR) tablet 40 mg  40 mg Oral q1800 Tu, Ching T, DO      . [START ON 07/11/2020] cefTRIAXone (ROCEPHIN) 2 g in sodium chloride 0.9 % 100 mL IVPB  2 g Intravenous Q24H Rocky Morel, RPH      .  chlorhexidine (HIBICLENS) 4 % liquid 4 application  60 mL Topical Once Samara Deist, DPM      . Derrill Memo ON 07/11/2020] influenza vac split quadrivalent PF (FLUARIX) injection 0.5 mL  0.5 mL Intramuscular Tomorrow-1000 Nicole Kindred A, DO      . insulin aspart (novoLOG) injection 0-5 Units  0-5 Units Subcutaneous QHS Tu, Ching T, DO   2 Units at 07/09/20 2329  . insulin aspart (novoLOG) injection 0-9 Units  0-9 Units Subcutaneous TID WC Tu, Ching T, DO   5 Units at 07/10/20 1333  . losartan (COZAAR) tablet 50 mg  50 mg Oral Daily Tu, Ching T, DO   50 mg at 07/10/20 1009  . oxyCODONE (Oxy IR/ROXICODONE) immediate release tablet 5 mg  5 mg Oral Q4H PRN Nicole Kindred A, DO   5 mg at 07/10/20 0813  . povidone-iodine 10 % swab 2 application  2 application Topical Once Samara Deist, DPM      . vancomycin (VANCOCIN) IVPB 1000 mg/200 mL premix  1,000 mg Intravenous Q12H Ena Dawley, RPH   Stopped at 07/10/20 1125     Abtx:  Anti-infectives (From admission, onward)   Start     Dose/Rate Route Frequency Ordered Stop   07/11/20 0800  cefTRIAXone (ROCEPHIN) 2 g in sodium chloride 0.9 % 100 mL IVPB        2 g 200 mL/hr over 30 Minutes Intravenous Every 24 hours 07/10/20 1312     07/10/20 1400  cefTRIAXone (ROCEPHIN) 1 g in sodium chloride 0.9 % 100 mL IVPB        1 g 200 mL/hr over 30 Minutes Intravenous  Once 07/10/20 1312 07/10/20 1454   07/10/20 1000  vancomycin (VANCOCIN) IVPB 1000 mg/200 mL premix        1,000 mg 200 mL/hr over 60 Minutes Intravenous Every 12 hours 07/10/20 0339     07/10/20 0800  cefTRIAXone (ROCEPHIN) 1 g in sodium chloride 0.9 % 100 mL IVPB  Status:  Discontinued        1 g 200 mL/hr over 30 Minutes Intravenous Every 24 hours 07/09/20 2304 07/10/20 1312   07/09/20 2345  vancomycin (VANCOCIN) IVPB 1000 mg/200 mL premix        1,000 mg 200 mL/hr over 60 Minutes Intravenous  Once 07/09/20 2326 07/10/20 0131   07/09/20 2115  vancomycin (VANCOCIN) 2,500 mg in sodium  chloride 0.9 % 500 mL IVPB  Status:  Discontinued        2,500 mg 250 mL/hr over 120 Minutes Intravenous  Once 07/09/20 2103 07/09/20 2104  07/09/20 2115  vancomycin (VANCOCIN) IVPB 1000 mg/200 mL premix        1,000 mg 200 mL/hr over 60 Minutes Intravenous  Once 07/09/20 2104 07/10/20 0008   07/09/20 2100  piperacillin-tazobactam (ZOSYN) IVPB 3.375 g        3.375 g 100 mL/hr over 30 Minutes Intravenous  Once 07/09/20 2046 07/09/20 2153      REVIEW OF SYSTEMS:  Const: negative fever, negative chills, negative weight loss Eyes: negative diplopia or visual changes, negative eye pain ENT: negative coryza, negative sore throat Resp: negative cough, hemoptysis, dyspnea Cards: negative for chest pain, palpitations, lower extremity edema GU: negative for frequency, dysuria and hematuria GI: Negative for abdominal pain, diarrhea, bleeding, constipation Skin: negative for rash and pruritus Heme: negative for easy bruising and gum/nose bleeding MS:as above Neurolo:neuropathy feet  Psych: negative for feelings of anxiety, depression  Endocrine: negative for thyroid, diabetes Allergy/Immunology- negative for any medication or food allergies ?  Objective:  VITALS:  BP (!) 154/104 (BP Location: Left Arm)   Pulse 79   Temp 97.9 F (36.6 C) (Oral)   Resp 18   Ht 6\' 3"  (1.905 m)   Wt 111.1 kg   SpO2 100%   BMI 30.62 kg/m  PHYSICAL EXAM:  General: Alert, cooperative, no distress, appears stated age.  Head: Normocephalic, without obvious abnormality, atraumatic. Eyes: Conjunctivae clear, anicteric sclerae. Pupils are equal ENT Nares normal. No drainage or sinus tenderness. Lips, mucosa, and tongue normal. No Thrush Neck: Supple, symmetrical, no adenopathy, thyroid: non tender no carotid bruit and no JVD. Back: No CVA tenderness. Lungs: Clear to auscultation bilaterally. No Wheezing or Rhonchi. No rales. Heart: Regular rate and rhythm, no murmur, rub or gallop. Abdomen: Soft,  non-tender,not distended. Bowel sounds normal. No masses Extremities: rt great toe- ulceration on the dorsum with exposed bone and eryhtema around       discolorationunder toe nail Dorsalis pedis felt Skin: No rashes or lesions. Or bruising Lymph: Cervical, supraclavicular normal. Neurologic: Grossly non-focal Pertinent Labs Lab Results CBC    Component Value Date/Time   WBC 5.6 07/10/2020 0534   RBC 5.02 07/10/2020 0534   HGB 14.7 07/10/2020 0534   HCT 43.6 07/10/2020 0534   PLT 226 07/10/2020 0534   MCV 86.9 07/10/2020 0534   MCH 29.3 07/10/2020 0534   MCHC 33.7 07/10/2020 0534   RDW 11.7 07/10/2020 0534   LYMPHSABS 1.0 07/09/2020 1258   MONOABS 0.6 07/09/2020 1258   EOSABS 0.1 07/09/2020 1258   BASOSABS 0.0 07/09/2020 1258    CMP Latest Ref Rng & Units 07/10/2020 07/09/2020 06/12/2019  Glucose 70 - 99 mg/dL 141(H) 182(H) 227(H)  BUN 6 - 20 mg/dL 9 10 12   Creatinine 0.61 - 1.24 mg/dL 0.71 0.73 0.75  Sodium 135 - 145 mmol/L 134(L) 137 141  Potassium 3.5 - 5.1 mmol/L 4.1 4.0 4.0  Chloride 98 - 111 mmol/L 101 103 106  CO2 22 - 32 mmol/L 25 25 25   Calcium 8.9 - 10.3 mg/dL 11.4(H) 11.1(H) 12.0(H)  Total Protein 6.5 - 8.1 g/dL - 7.5 7.1  Total Bilirubin 0.3 - 1.2 mg/dL - 1.1 0.8  Alkaline Phos 38 - 126 U/L - 96 143(H)  AST 15 - 41 U/L - 23 108(H)  ALT 0 - 44 U/L - 23 99(H)      Microbiology: Recent Results (from the past 240 hour(s))  Respiratory Panel by RT PCR (Flu A&B, Covid) - Nasopharyngeal Swab     Status: None   Collection Time:  07/09/20  9:28 PM   Specimen: Nasopharyngeal Swab  Result Value Ref Range Status   SARS Coronavirus 2 by RT PCR NEGATIVE NEGATIVE Final    Comment: (NOTE) SARS-CoV-2 target nucleic acids are NOT DETECTED.  The SARS-CoV-2 RNA is generally detectable in upper respiratoy specimens during the acute phase of infection. The lowest concentration of SARS-CoV-2 viral copies this assay can detect is 131 copies/mL. A negative result does  not preclude SARS-Cov-2 infection and should not be used as the sole basis for treatment or other patient management decisions. A negative result may occur with  improper specimen collection/handling, submission of specimen other than nasopharyngeal swab, presence of viral mutation(s) within the areas targeted by this assay, and inadequate number of viral copies (<131 copies/mL). A negative result must be combined with clinical observations, patient history, and epidemiological information. The expected result is Negative.  Fact Sheet for Patients:  PinkCheek.be  Fact Sheet for Healthcare Providers:  GravelBags.it  This test is no t yet approved or cleared by the [place] FDA and  has been authorized for detection and/or diagnosis of SARS-CoV-2 by FDA under an Emergency Use Authorization (EUA). This EUA will remain  in effect (meaning this test can be used) for the duration of the COVID-19 declaration under Section 564(b)(1) of the Act, 21 U.S.C. section 360bbb-3(b)(1), unless the authorization is terminated or revoked sooner.     Influenza A by PCR NEGATIVE NEGATIVE Final   Influenza B by PCR NEGATIVE NEGATIVE Final    Comment: (NOTE) The Xpert Xpress SARS-CoV-2/FLU/RSV assay is intended as an aid in  the diagnosis of influenza from Nasopharyngeal swab specimens and  should not be used as a sole basis for treatment. Nasal washings and  aspirates are unacceptable for Xpert Xpress SARS-CoV-2/FLU/RSV  testing.  Fact Sheet for Patients: PinkCheek.be  Fact Sheet for Healthcare Providers: GravelBags.it  This test is not yet approved or cleared by the [place] FDA and  has been authorized for detection and/or diagnosis of SARS-CoV-2 by  FDA under an Emergency Use Authorization (EUA). This EUA will remain  in effect (meaning this test can be used) for the  duration of the  Covid-19 declaration under Section 564(b)(1) of the Act, 21  U.S.C. section 360bbb-3(b)(1), unless the authorization is  terminated or revoked. Performed at [facility], [address] [place],  [address]     IMAGING RESULTS: Mri reviewed- as above I have personally reviewed the films ? Impression/Recommendation ? 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 On vanco and ceftriaxone- plan for amputation of great toe tomorrow  H/o 5 th toe ray excision because of infection in feb 2020  DM on insulin  HTn on amlodipine, losartan Hyperlipidemia- on atorvastatin  ? ___________________________________________________ Discussed with patient, requesting provider .

## 2020-07-10 NOTE — Plan of Care (Signed)
  Problem: Pain Management: Goal: Satisfaction with pain management regimen will be met by discharge Outcome: Completed/Met

## 2020-07-10 NOTE — Progress Notes (Signed)
PHARMACY NOTE:  ANTIMICROBIAL DOSAGE ADJUSTMENT   Current antimicrobial dosage:  Ceftriaxone 1 g IV q24h  Indication: Osteomyelitis   Renal Function:  Estimated Creatinine Clearance: 142 mL/min (by C-G formula based on SCr of 0.71 mg/dL).     Antimicrobial dosage has been changed to:  Additional ceftriaxone 1 g IV x1 today then ceftriaxone 2 g IV q24h starting tomorrow    Thank you for allowing pharmacy to be a part of this patient's care.  Rocky Morel, [facility] 07/10/2020 1:13 PM

## 2020-07-10 NOTE — Progress Notes (Signed)
Pharmacy Antibiotic Note  [name] is a 54 y.o. male admitted on 07/09/2020 with osteomyelitis.  Pharmacy has been consulted for Vancomycin dosing.  Plan: Vancomycin 2gm x 1 then 1gm IV q12hrs (nomogram dosing)  Height: 6\' 3"  (190.5 cm) Weight: 111.1 kg (245 lb) IBW/kg (Calculated) : 84.5  Temp (24hrs), Avg:99.2 F (37.3 C), Min:98.4 F (36.9 C), Max:99.9 F (37.7 C)  Recent Labs  Lab 07/09/20 1258  WBC 6.1  CREATININE 0.73  LATICACIDVEN 1.1    Estimated Creatinine Clearance: 142 mL/min (by C-G formula based on SCr of 0.73 mg/dL).    No Known Allergies  Antimicrobials this admission:   >>    >>   Dose adjustments this admission:   Microbiology results:  BCx:   UCx:    Sputum:    MRSA PCR:   Thank you for allowing pharmacy to be a part of this patient's care.  Hart Robinsons A 07/10/2020 3:40 AM

## 2020-07-10 NOTE — Progress Notes (Addendum)
PROGRESS NOTE    [name]   YBO:[id number]  DOB: Apr 15, 1966  PCP: Valera Castle, MD    DOA: 07/09/2020 LOS: 1   Brief Narrative   [name] is a 54 y.o. male with medical history significant for type 2 diabetes with peripheral neuropathy, PAD, hypertension, history of osteomyelitis s/p amputation of the right 5th toe who presented to the ED on 07/09/20 with worsening wound of the right great toe.  The initially injury was 3 weeks prior, scraped dorsal aspect of the R great toe.  Seen by PCP 5 days prior to presenting, prescribed dicloxacillin.  Outpatient Xray showed probable R great toe osteomyelitis.    In the ED, afebrile, BP uncontrolled with systolic BP up to 277'O.  No leukocytosis on CBC.  MRI confirmed showing osteomyelitis throughout the R great toe with bony destructive change about the IP joint compatible with septic joint. Negative for myositis or abscess.  First MTP and midfoot osteoarthritis.  Given IV Vancomycin and Zosyn in the ED.    Admitted to hospitalist service.  On IV Vancomycin and Rocephin currently.  Podiatry consulted.     Assessment & Plan   Principal Problem:   Osteomyelitis of great toe of right foot (HCC) Active Problems:   Type 2 diabetes mellitus with complication, without long-term current use of insulin (HCC)   HTN (hypertension)   Hypercalcemia   Osteomyelitis of R great toe - POA, confirmed on MRI.   --Podiatry consulted and plan to go to OR tomorrow for amputation. --Follow operative tissue cultures --Continue empiric Vancomycin & Rocephin --ID consulted --Wound care  Hypercalcemia - POA with Ca 11.1 >> 11.4.  On chart review, appears this has been present since at least 2020.  Etiology unclear.   --PTH is pending, further evaluation based on result  Type 2 Diabetes - well controlled, A1c is 6.7%.  On glipizide at home, held. --Sliding scale Novolog AC/HS.  Essential Hypertension - chronic, uncontrolled on  admission. --Continue home amlodipine and losartan  Obesity: Body mass index is 30.62 kg/m.  Complicates overall care and prognosis.  Recommend lifestyle modifications for weight loss including diet and physical activity.  DVT prophylaxis: SCDs Start: 07/09/20 2126   Diet:  Diet Orders (From admission, onward)    Start     Ordered   07/09/20 2127  Diet Heart Room service appropriate? Yes; Fluid consistency: Thin  Diet effective now       Question Answer Comment  Room service appropriate? Yes   Fluid consistency: Thin      07/09/20 2126            Code Status: Full Code    Subjective 07/10/20    Pt seen in ED hall bed, awaiting a room.  Seen by podiatry earlier and pt states plan is for amputation tomorrow.  He denies any current complaints.  Says pain controlled.  No fever/chills.   Disposition Plan & Communication   Status is: Inpatient  Remains inpatient appropriate because:IV treatments appropriate due to intensity of illness or inability to take PO   Dispo: The patient is from: Home              Anticipated d/c is to: Home              Anticipated d/c date is: 2 days              Patient currently is not medically stable to d/c.        Family Communication:  none at bedside, will attempt to call this afternoon. Pt able to update.    Consults, Procedures, Significant Events   Consultants:   Podiatry  Infectious Disease  Procedures:   None  Antimicrobials:  Anti-infectives (From admission, onward)   Start     Dose/Rate Route Frequency Ordered Stop   07/10/20 1000  vancomycin (VANCOCIN) IVPB 1000 mg/200 mL premix        1,000 mg 200 mL/hr over 60 Minutes Intravenous Every 12 hours 07/10/20 0339     07/10/20 0800  cefTRIAXone (ROCEPHIN) 1 g in sodium chloride 0.9 % 100 mL IVPB        1 g 200 mL/hr over 30 Minutes Intravenous Every 24 hours 07/09/20 2304     07/09/20 2345  vancomycin (VANCOCIN) IVPB 1000 mg/200 mL premix        1,000 mg 200 mL/hr  over 60 Minutes Intravenous  Once 07/09/20 2326 07/10/20 0131   07/09/20 2115  vancomycin (VANCOCIN) 2,500 mg in sodium chloride 0.9 % 500 mL IVPB  Status:  Discontinued        2,500 mg 250 mL/hr over 120 Minutes Intravenous  Once 07/09/20 2103 07/09/20 2104   07/09/20 2115  vancomycin (VANCOCIN) IVPB 1000 mg/200 mL premix        1,000 mg 200 mL/hr over 60 Minutes Intravenous  Once 07/09/20 2104 07/10/20 0008   07/09/20 2100  piperacillin-tazobactam (ZOSYN) IVPB 3.375 g        3.375 g 100 mL/hr over 30 Minutes Intravenous  Once 07/09/20 2046 07/09/20 2153        Objective   Vitals:   07/09/20 1842 07/09/20 2220 07/10/20 0452 07/10/20 0722  BP: (!) 193/114 (!) 146/81 (!) 164/97 (!) 150/88  Pulse: 87 87 78 79  Resp: 20 18 18 18   Temp: 99.9 F (37.7 C)   98 F (36.7 C)  TempSrc: Oral   Oral  SpO2: 100% 99% 99% 99%  Weight:      Height:        Intake/Output Summary (Last 24 hours) at 07/10/2020 0740 Last data filed at 07/10/2020 0008 Gross per 24 hour  Intake 200 ml  Output --  Net 200 ml   Filed Weights   07/09/20 1248  Weight: 111.1 kg    Physical Exam:  General exam: awake, alert, no acute distress Respiratory system: CTAB, no wheezes, rales or rhonchi, normal respiratory effort. Cardiovascular system: normal S1/S2, RRR, no JVD, murmurs, rubs, gallops, no pedal edema.   Gastrointestinal system: soft, NT, ND, no HSM felt, +bowel sounds. Central nervous system: A&O x4. no gross focal neurologic deficits, normal speech Extremities: moves all, R great toe with clean dry intact dressing, erythematous midfoot and adjacent toes Psychiatry: normal mood, congruent affect, judgement and insight appear normal  Labs   Data Reviewed: I have personally reviewed following labs and imaging studies  CBC: Recent Labs  Lab 07/09/20 1258 07/10/20 0534  WBC 6.1 5.6  NEUTROABS 4.4  --   HGB 14.8 14.7  HCT 43.1 43.6  MCV 88.1 86.9  PLT 257 161   Basic Metabolic  Panel: Recent Labs  Lab 07/09/20 1258 07/10/20 0534  NA 137 134*  K 4.0 4.1  CL 103 101  CO2 25 25  GLUCOSE 182* 141*  BUN 10 9  CREATININE 0.73 0.71  CALCIUM 11.1* 11.4*   GFR: Estimated Creatinine Clearance: 142 mL/min (by C-G formula based on SCr of 0.71 mg/dL). Liver Function Tests: Recent Labs  Lab 07/09/20 1258  AST 23  ALT 23  ALKPHOS 96  BILITOT 1.1  PROT 7.5  ALBUMIN 3.8   No results for input(s): LIPASE, AMYLASE in the last 168 hours. No results for input(s): AMMONIA in the last 168 hours. Coagulation Profile: No results for input(s): INR, PROTIME in the last 168 hours. Cardiac Enzymes: No results for input(s): CKTOTAL, CKMB, CKMBINDEX, TROPONINI in the last 168 hours. BNP (last 3 results) No results for input(s): PROBNP in the last 8760 hours. HbA1C: Recent Labs    07/09/20 1258  HGBA1C 6.7*   CBG: Recent Labs  Lab 07/09/20 2323 07/10/20 0717  GLUCAP 243* 172*   Lipid Profile: No results for input(s): CHOL, HDL, LDLCALC, TRIG, CHOLHDL, LDLDIRECT in the last 72 hours. Thyroid Function Tests: No results for input(s): TSH, T4TOTAL, FREET4, T3FREE, THYROIDAB in the last 72 hours. Anemia Panel: No results for input(s): VITAMINB12, FOLATE, FERRITIN, TIBC, IRON, RETICCTPCT in the last 72 hours. Sepsis Labs: Recent Labs  Lab 07/09/20 1258  LATICACIDVEN 1.1    Recent Results (from the past 240 hour(s))  Respiratory Panel by RT PCR (Flu A&B, Covid) - Nasopharyngeal Swab     Status: None   Collection Time: 07/09/20  9:28 PM   Specimen: Nasopharyngeal Swab  Result Value Ref Range Status   SARS Coronavirus 2 by RT PCR NEGATIVE NEGATIVE Final    Comment: (NOTE) SARS-CoV-2 target nucleic acids are NOT DETECTED.  The SARS-CoV-2 RNA is generally detectable in upper respiratoy specimens during the acute phase of infection. The lowest concentration of SARS-CoV-2 viral copies this assay can detect is 131 copies/mL. A negative result does not preclude  SARS-Cov-2 infection and should not be used as the sole basis for treatment or other patient management decisions. A negative result may occur with  improper specimen collection/handling, submission of specimen other than nasopharyngeal swab, presence of viral mutation(s) within the areas targeted by this assay, and inadequate number of viral copies (<131 copies/mL). A negative result must be combined with clinical observations, patient history, and epidemiological information. The expected result is Negative.  Fact Sheet for Patients:  PinkCheek.be  Fact Sheet for Healthcare Providers:  GravelBags.it  This test is no t yet approved or cleared by the [place] FDA and  has been authorized for detection and/or diagnosis of SARS-CoV-2 by FDA under an Emergency Use Authorization (EUA). This EUA will remain  in effect (meaning this test can be used) for the duration of the COVID-19 declaration under Section 564(b)(1) of the Act, 21 U.S.C. section 360bbb-3(b)(1), unless the authorization is terminated or revoked sooner.     Influenza A by PCR NEGATIVE NEGATIVE Final   Influenza B by PCR NEGATIVE NEGATIVE Final    Comment: (NOTE) The Xpert Xpress SARS-CoV-2/FLU/RSV assay is intended as an aid in  the diagnosis of influenza from Nasopharyngeal swab specimens and  should not be used as a sole basis for treatment. Nasal washings and  aspirates are unacceptable for Xpert Xpress SARS-CoV-2/FLU/RSV  testing.  Fact Sheet for Patients: PinkCheek.be  Fact Sheet for Healthcare Providers: GravelBags.it  This test is not yet approved or cleared by the [place] FDA and  has been authorized for detection and/or diagnosis of SARS-CoV-2 by  FDA under an Emergency Use Authorization (EUA). This EUA will remain  in effect (meaning this test can be used) for the duration of the   Covid-19 declaration under Section 564(b)(1) of the Act, 21  U.S.C. section 360bbb-3(b)(1), unless the authorization is  terminated or revoked. Performed at  [facility], [address] [place], [place] [address]       Imaging Studies   MR FOOT RIGHT W WO CONTRAST  Result Date: 07/10/2020 CLINICAL DATA:  Skin ulceration on the plantar surface of the right great toe in a diabetic patient. Question osteomyelitis. EXAM: MRI OF THE RIGHT FOREFOOT WITHOUT AND WITH CONTRAST TECHNIQUE: Multiplanar, multisequence MR imaging of the right forefoot was performed before and after the administration of intravenous contrast. CONTRAST:  10 mL GADAVIST IV SOLN COMPARISON:  Plain films right great toe 07/05/2020. FINDINGS: Bones/Joint/Cartilage There is intense marrow edema and enhancement throughout the great toe consistent with osteomyelitis. Bony destructive change is seen about the IP joint and there is a small IP joint effusion. No other evidence of osteomyelitis is identified. No other evidence of osteomyelitis or septic joint is identified. The head of the proximal phalanx of the great toe appears exposed to air. 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 There is also midfoot osteoarthritis which appears mild-to-moderate in degree and worst at the second tarsometatarsal joint. Ligaments Intact. Muscles and Tendons There is atrophy of intrinsic musculature the foot. No intramuscular fluid collection. No tendon tear. Soft tissues Bandaging is present about the great toe. There is soft tissue edema and enhancement in the great toe consistent with cellulitis. IMPRESSION: Osteomyelitis throughout the great toe with bony destructive change about the IP joint compatible with septic joint. Negative for myositis or abscess. First MTP and midfoot osteoarthritis. Electronically Signed   By: Inge Rise M.D.   On: 07/10/2020 07:12     Medications   Scheduled Meds: .  atorvastatin  40 mg Oral q1800  . insulin aspart  0-5 Units Subcutaneous QHS  . insulin aspart  0-9 Units Subcutaneous TID WC  . losartan  50 mg Oral Daily   Continuous Infusions: . cefTRIAXone (ROCEPHIN)  IV    . vancomycin         LOS: 1 day    Time spent: 30 minutes    Ezekiel Slocumb, DO [facility]  07/10/2020, 7:40 AM    If 7PM-7AM, please contact night-coverage. How to contact the [facility] Attending or Consulting provider [place] or covering provider during after hours [place], for this patient?    1. Check the care team in [facility] and look for a) attending/consulting [facility] provider listed and b) the [facility] team listed 2. Log into [email or web address] and use [facility]'s universal password to access. If you do not have the password, please contact the hospital operator. 3. Locate the [facility] provider you are looking for under [facility] and page to a number that you can be directly reached. 4. If you still have difficulty reaching the provider, please page the [facility] (Director on Call) for the Hospitalists listed on amion for assistance.

## 2020-07-10 NOTE — ED Notes (Signed)
Pt eating breakfast tray 

## 2020-07-10 NOTE — ED Notes (Signed)
Pt water refilled

## 2020-07-10 NOTE — Consult Note (Addendum)
WOC consult requested for right great toe wound.  Reviewed photos and progress notes in the EMR.  MRI indicates; "Osteomyelitis throughout the great toe with bony destructive change about the IP joint compatible with septic joint."  This complex medical condition is beyond the scope of practice for [place] nurses.  Podiatry team has been requested and a consult is pending.  Please refer to their team for further plan of care.  Please re-consult if further assistance is needed.  Thank-you,  Julien Girt MSN, [place], [place], [place], [place]

## 2020-07-10 NOTE — Hospital Course (Addendum)
[name] is a 54 y.o. male with medical history significant for type 2 diabetes with peripheral neuropathy, PAD, hypertension, history of osteomyelitis s/p amputation of the right 5th toe who presented to the ED on 07/09/20 with worsening wound of the right great toe.  The initially injury was 3 weeks prior, scraped dorsal aspect of the R great toe.  Seen by PCP 5 days prior to presenting, prescribed dicloxacillin.  Outpatient Xray showed probable R great toe osteomyelitis.    In the ED, afebrile, BP uncontrolled with systolic BP up to 501'T.  No leukocytosis on CBC.  MRI confirmed showing osteomyelitis throughout the R great toe with bony destructive change about the IP joint compatible with septic joint. Negative for myositis or abscess.  First MTP and midfoot osteoarthritis.  Given IV Vancomycin and Zosyn in the ED.    Admitted to hospitalist service.  On IV Vancomycin and Rocephin currently.  Podiatry consulted.

## 2020-07-11 ENCOUNTER — Encounter: Payer: Self-pay | Admitting: Family Medicine

## 2020-07-11 ENCOUNTER — Inpatient Hospital Stay: Payer: BC Managed Care – PPO | Admitting: Anesthesiology

## 2020-07-11 ENCOUNTER — Encounter
Admission: EM | Disposition: A | Discharge status: Home / Self Care | Payer: Self-pay | Source: Home / Self Care | Attending: Internal Medicine

## 2020-07-11 ENCOUNTER — Inpatient Hospital Stay: Payer: BC Managed Care – PPO

## 2020-07-11 HISTORY — PX: AMPUTATION TOE: SHX6595

## 2020-07-11 LAB — GLUCOSE, CAPILLARY
Glucose-Capillary: 212 mg/dL — ABNORMAL HIGH (ref 70–99)
Glucose-Capillary: 184 mg/dL — ABNORMAL HIGH (ref 70–99)
Glucose-Capillary: 155 mg/dL — ABNORMAL HIGH (ref 70–99)
Glucose-Capillary: 156 mg/dL — ABNORMAL HIGH (ref 70–99)
Glucose-Capillary: 156 mg/dL — ABNORMAL HIGH (ref 70–99)
Glucose-Capillary: 283 mg/dL — ABNORMAL HIGH (ref 70–99)

## 2020-07-11 LAB — BASIC METABOLIC PANEL
Anion gap: 6 (ref 5–15)
BUN: 10 mg/dL (ref 6–20)
CO2: 27 mmol/L (ref 22–32)
Calcium: 11.8 mg/dL — ABNORMAL HIGH (ref 8.9–10.3)
Chloride: 100 mmol/L (ref 98–111)
Creatinine, Ser: 0.84 mg/dL (ref 0.61–1.24)
GFR, Estimated: >60 mL/min (ref >60)
Glucose, Bld: 231 mg/dL — ABNORMAL HIGH (ref 70–99)
Potassium: 5 mmol/L (ref 3.5–5.1)
Sodium: 133 mmol/L — ABNORMAL LOW (ref 135–145)

## 2020-07-11 LAB — PARATHYROID HORMONE, INTACT (NO CA): PTH: 80 pg/mL — ABNORMAL HIGH (ref 15–65)

## 2020-07-11 LAB — SEDIMENTATION RATE: Sed Rate: 35 mm/hr — ABNORMAL HIGH (ref 0–20)

## 2020-07-11 LAB — C-REACTIVE PROTEIN: CRP: 1.2 mg/dL — ABNORMAL HIGH (ref <1.0)

## 2020-07-11 SURGERY — AMPUTATION, TOE
Anesthesia: General | Site: Toe | Laterality: Right

## 2020-07-11 MED ORDER — LIDOCAINE HCL (CARDIAC) PF 100 MG/5ML IV SOSY
PREFILLED_SYRINGE | INTRAVENOUS | Status: DC | PRN
  Administered 2020-07-11: 100 mg via INTRAVENOUS

## 2020-07-11 MED ORDER — PHENYLEPHRINE HCL (PRESSORS) 10 MG/ML IV SOLN
INTRAVENOUS | Status: DC | PRN
  Administered 2020-07-11: 200 ug via INTRAVENOUS
  Administered 2020-07-11: 100 ug via INTRAVENOUS

## 2020-07-11 MED ORDER — MIDAZOLAM HCL 2 MG/2ML IJ SOLN
INTRAMUSCULAR | Status: DC | PRN
  Administered 2020-07-11: 2 mg via INTRAVENOUS

## 2020-07-11 MED ORDER — ACETAMINOPHEN 10 MG/ML IV SOLN
INTRAVENOUS | Status: DC | PRN
  Administered 2020-07-11: 1000 mg via INTRAVENOUS

## 2020-07-11 MED ORDER — EPHEDRINE SULFATE 50 MG/ML IJ SOLN
INTRAMUSCULAR | Status: DC | PRN
  Administered 2020-07-11: 10 mg via INTRAVENOUS

## 2020-07-11 MED ORDER — PROPOFOL 10 MG/ML IV BOLUS
INTRAVENOUS | Status: DC | PRN
  Administered 2020-07-11: 250 mg via INTRAVENOUS

## 2020-07-11 MED ORDER — FENTANYL CITRATE (PF) 100 MCG/2ML IJ SOLN
INTRAMUSCULAR | Status: AC
  Filled 2020-07-11: qty 2

## 2020-07-11 MED ORDER — DEXAMETHASONE SODIUM PHOSPHATE 10 MG/ML IJ SOLN
INTRAMUSCULAR | Status: DC | PRN
  Administered 2020-07-11: 5 mg via INTRAVENOUS

## 2020-07-11 MED ORDER — BUPIVACAINE HCL 0.5 % IJ SOLN
INTRAMUSCULAR | Status: DC | PRN
  Administered 2020-07-11: 5 mL

## 2020-07-11 MED ORDER — PROPOFOL 500 MG/50ML IV EMUL
INTRAVENOUS | Status: AC
  Filled 2020-07-11: qty 50

## 2020-07-11 MED ORDER — ONDANSETRON HCL 4 MG/2ML IJ SOLN: 4.0000 mg | Freq: Once | INTRAMUSCULAR | Status: DC | PRN

## 2020-07-11 MED ORDER — FENTANYL CITRATE (PF) 100 MCG/2ML IJ SOLN
INTRAMUSCULAR | Status: DC | PRN
Start: 2020-07-11 — End: 2020-07-11
  Administered 2020-07-11 (×2): 25 ug via INTRAVENOUS
  Administered 2020-07-11: 50 ug via INTRAVENOUS

## 2020-07-11 MED ORDER — ONDANSETRON HCL 4 MG/2ML IJ SOLN
INTRAMUSCULAR | Status: DC | PRN
  Administered 2020-07-11: 4 mg via INTRAVENOUS

## 2020-07-11 MED ORDER — SODIUM CHLORIDE 0.9 % IV SOLN: INTRAVENOUS | Status: DC

## 2020-07-11 MED ORDER — LIDOCAINE HCL (PF) 1 % IJ SOLN
INTRAMUSCULAR | Status: DC | PRN
  Administered 2020-07-11: 5 mL

## 2020-07-11 MED ORDER — CHLORHEXIDINE GLUCONATE 0.12 % MT SOLN
OROMUCOSAL | Status: AC
  Administered 2020-07-11: 15 mL via OROMUCOSAL
  Filled 2020-07-11: qty 15

## 2020-07-11 MED ORDER — CHLORHEXIDINE GLUCONATE 0.12 % MT SOLN: 15.0000 mL | Freq: Once | OROMUCOSAL | Status: AC

## 2020-07-11 MED ORDER — MIDAZOLAM HCL 2 MG/2ML IJ SOLN
INTRAMUSCULAR | Status: AC
  Filled 2020-07-11: qty 2

## 2020-07-11 MED ORDER — FENTANYL CITRATE (PF) 100 MCG/2ML IJ SOLN: 25.0000 ug | INTRAMUSCULAR | Status: DC | PRN

## 2020-07-11 SURGICAL SUPPLY — 43 items
BLADE OSC/SAGITTAL MD 5.5X18 (BLADE) IMPLANT
BLADE SURG MINI STRL (BLADE) ×2 IMPLANT
BNDG CONFORM 2 STRL LF (GAUZE/BANDAGES/DRESSINGS) ×2 IMPLANT
BNDG CONFORM 3 STRL LF (GAUZE/BANDAGES/DRESSINGS) ×4 IMPLANT
BNDG ELASTIC 4X5.8 VLCR NS LF (GAUZE/BANDAGES/DRESSINGS) ×2 IMPLANT
BNDG ESMARK 4X12 TAN STRL LF (GAUZE/BANDAGES/DRESSINGS) ×2 IMPLANT
BNDG GAUZE 4.5X4.1 6PLY STRL (MISCELLANEOUS) ×2 IMPLANT
CANISTER SUCT 1200ML W/VALVE (MISCELLANEOUS) ×2 IMPLANT
COVER WAND RF STERILE (DRAPES) ×2 IMPLANT
CUFF TOURN SGL QUICK 12 (TOURNIQUET CUFF) IMPLANT
CUFF TOURN SGL QUICK 18X4 (TOURNIQUET CUFF) ×2 IMPLANT
DRAPE FLUOR MINI C-ARM 54X84 (DRAPES) ×2 IMPLANT
DRAPE XRAY CASSETTE 23X24 (DRAPES) ×2 IMPLANT
DURAPREP 26ML APPLICATOR (WOUND CARE) ×2 IMPLANT
ELECT REM PT RETURN 9FT ADLT (ELECTROSURGICAL) ×2
ELECTRODE REM PT RTRN 9FT ADLT (ELECTROSURGICAL) ×1 IMPLANT
GAUZE PACKING IODOFORM 1/2 (PACKING) ×2 IMPLANT
GAUZE SPONGE 4X4 12PLY STRL (GAUZE/BANDAGES/DRESSINGS) ×2 IMPLANT
GAUZE XEROFORM 1X8 LF (GAUZE/BANDAGES/DRESSINGS) ×2 IMPLANT
GLOVE BIO SURGEON STRL SZ7.5 (GLOVE) ×2 IMPLANT
GLOVE INDICATOR 8.0 STRL GRN (GLOVE) ×2 IMPLANT
GOWN STRL REUS W/ TWL XL LVL3 (GOWN DISPOSABLE) ×2 IMPLANT
GOWN STRL REUS W/TWL XL LVL3 (GOWN DISPOSABLE) ×2
KIT TURNOVER KIT A (KITS) ×2 IMPLANT
LABEL OR SOLS (LABEL) ×2 IMPLANT
NEEDLE FILTER BLUNT 18X 1/2SAF (NEEDLE) ×1
NEEDLE FILTER BLUNT 18X1 1/2 (NEEDLE) ×1 IMPLANT
NEEDLE HYPO 25X1 1.5 SAFETY (NEEDLE) ×2 IMPLANT
NS IRRIG 500ML POUR BTL (IV SOLUTION) ×2 IMPLANT
PACK EXTREMITY (MISCELLANEOUS) ×2 IMPLANT
PAD ABD DERMACEA PRESS 5X9 (GAUZE/BANDAGES/DRESSINGS) ×4 IMPLANT
PULSAVAC PLUS IRRIG FAN TIP (DISPOSABLE)
SHIELD FULL FACE ANTIFOG 7M (MISCELLANEOUS) ×2 IMPLANT
SOL .9 NS 3000ML IRR  AL (IV SOLUTION) ×1
SOL .9 NS 3000ML IRR UROMATIC (IV SOLUTION) ×1 IMPLANT
STOCKINETTE M/LG 89821 (MISCELLANEOUS) ×2 IMPLANT
STRAP SAFETY 5IN WIDE (MISCELLANEOUS) ×2 IMPLANT
SUT ETHILON 3-0 FS-10 30 BLK (SUTURE)
SUT ETHILON 5-0 FS-2 18 BLK (SUTURE) ×2 IMPLANT
SUT VIC AB 4-0 FS2 27 (SUTURE) IMPLANT
SUTURE EHLN 3-0 FS-10 30 BLK (SUTURE) IMPLANT
SYR 10ML LL (SYRINGE) ×6 IMPLANT
TIP FAN IRRIG PULSAVAC PLUS (DISPOSABLE) IMPLANT

## 2020-07-11 NOTE — Anesthesia Postprocedure Evaluation (Signed)
Anesthesia Post Note  Patient: [name]  Procedure(s) Performed: AMPUTATION TOE-Right Great Toe (Right Toe)  Patient location during evaluation: PACU Anesthesia Type: General Level of consciousness: awake and alert Pain management: pain level controlled Vital Signs Assessment: post-procedure vital signs reviewed and stable Respiratory status: spontaneous breathing, nonlabored ventilation, respiratory function stable and patient connected to nasal cannula oxygen Cardiovascular status: blood pressure returned to baseline and stable Postop Assessment: no apparent nausea or vomiting Anesthetic complications: no   No complications documented.   Last Vitals:  Vitals:   07/11/20 1638 07/11/20 1656  BP:  121/79  Pulse:  82  Resp:  18  Temp: (!) 36.1 C 36.5 C  SpO2:  98%    Last Pain:  Vitals:   07/11/20 1700  TempSrc:   PainSc: 0-No pain                 Precious Haws Adan Beal

## 2020-07-11 NOTE — Anesthesia Preprocedure Evaluation (Signed)
Anesthesia Evaluation  Patient identified by MRN, date of birth, ID band Patient awake    Reviewed: Allergy & Precautions, H&P , NPO status , Patient's Chart, lab work & pertinent test results, reviewed documented beta blocker date and time   Airway Mallampati: III  TM Distance: >3 FB Neck ROM: full    Dental  (+) Teeth Intact   Pulmonary neg pulmonary ROS, Current Smoker and Patient abstained from smoking.,    Pulmonary exam normal        Cardiovascular Exercise Tolerance: Good hypertension, On Medications negative cardio ROS Normal cardiovascular exam Rate:Normal     Neuro/Psych negative neurological ROS  negative psych ROS   GI/Hepatic negative GI ROS, Neg liver ROS,   Endo/Other  negative endocrine ROSdiabetes, Well Controlled, Type 2, Oral Hypoglycemic Agents  Renal/GU negative Renal ROS  negative genitourinary   Musculoskeletal   Abdominal   Peds  Hematology negative hematology ROS (+)   Anesthesia Other Findings   Reproductive/Obstetrics negative OB ROS                             Anesthesia Physical Anesthesia Plan  ASA: II and emergent  Anesthesia Plan: General LMA   Post-op Pain Management:    Induction:   PONV Risk Score and Plan: 2  Airway Management Planned:   Additional Equipment:   Intra-op Plan:   Post-operative Plan:   Informed Consent: I have reviewed the patients History and Physical, chart, labs and discussed the procedure including the risks, benefits and alternatives for the proposed anesthesia with the patient or authorized representative who has indicated his/her understanding and acceptance.       Plan Discussed with: CRNA  Anesthesia Plan Comments:         Anesthesia Quick Evaluation

## 2020-07-11 NOTE — Transfer of Care (Signed)
Immediate Anesthesia Transfer of Care Note  Patient: [name]  Procedure(s) Performed: AMPUTATION TOE-Right Great Toe (Right Toe)  Patient Location: PACU  Anesthesia Type:General  Level of Consciousness: sedated  Airway & Oxygen Therapy: Patient Spontanous Breathing  Post-op Assessment: Report given to RN and Post -op Vital signs reviewed and stable  Post vital signs: Reviewed and stable  Last Vitals:  Vitals Value Taken Time  BP 97/73 07/11/20 1611  Temp 36.1 C 07/11/20 1611  Pulse 77 07/11/20 1612  Resp 16 07/11/20 1612  SpO2 97 % 07/11/20 1612  Vitals shown include unvalidated device data.  Last Pain:  Vitals:   07/11/20 1443  TempSrc: Temporal  PainSc: 0-No pain         Complications: No complications documented.

## 2020-07-11 NOTE — Progress Notes (Signed)
PROGRESS NOTE  [name] NVB:[id number] DOB: 09-Jun-1966 DOA: 07/09/2020 PCP: Valera Castle, MD  HPI/Recap of past 24 hours: [name] a 54 y.o.malewith medical history significant fortype 2 diabeteswith peripheral neuropathy,PAD,hypertension, history of osteomyelitiss/p amputation of the right 5th toewho presented to the ED on 07/09/20 with worsening wound of the right great toe.  The initially injury was 3 weeks prior, scraped dorsal aspect of the R great toe.  Seen by PCP 5 days prior to presenting, prescribed dicloxacillin.  Outpatient Xray showed probable R great toe osteomyelitis.    In the ED, afebrile, BP uncontrolled with systolic BP up to 997'F.  No leukocytosis on CBC.  MRI confirmed showing osteomyelitis throughout the R great toe with bony destructive change about the IP joint compatible with septic joint. Negative for myositis or abscess.  First MTP and midfoot osteoarthritis. Given IV Vancomycin and Zosyn in the ED.    Admitted to hospitalist service.  On IV Vancomycin and Rocephin currently.  Podiatry consulted.  07/11/20: Seen and examined at his bedside this morning.  Denies any pain on his right great toe.  Planned podiatry surgery this afternoon.  Assessment/Plan: Principal Problem:   Osteomyelitis of great toe of right foot (HCC) Active Problems:   Type 2 diabetes mellitus with complication, without long-term current use of insulin (HCC)   HTN (hypertension)   Hypercalcemia  Right great toe osteomyelitis, seen on MRI, POA. Podiatry following, plan for surgical procedure this afternoon Follow operative tissue cultures Continue empiric IV antibiotics, vancomycin and Rocephin MRSA negative on 07/10/2020 Local wound care. ID following. ESR 35, CRP 1.2.  Worsening hypercalcemia. Corrected calcium 12.0 on 07/11/2020 PTH is elevated at 80 from 07/10/2020 Start IV fluids normal saline at 100 cc/h. Nephrology consult  Hypovolemic  hyponatremia Serum sodium 133 IV fluids started normal saline at 100 cc/h Continue to monitor  Type 2 diabetes with hyperglycemia Obtain hemoglobin A1c, 6.7 on 07/09/2020 Cover with insulin sliding scale Avoid blood glucose above 180 in the setting of wound care with planned surgery.  Essential hypertension BP is not at goal Continue home Norvasc and losartan Monitor vital signs.  Hyperlipidemia Resume home Lipitor.    Code Status: Full code  Family Communication: None at bedside  Disposition Plan: Home when podiatrist consult.   Consultants:  Podiatry  Nephrology  Procedures:  Surgical intervention planned on 07/11/2020 afternoon  Antimicrobials:  IV vancomycin  Rocephin  DVT prophylaxis: SCD  Status is: Inpatient    Dispo:  Patient From: Home  Planned Disposition: Home  Expected discharge date: 07/13/20  Medically stable for discharge: No, ongoing management of right great toe osteomyelitis with planned surgical intervention this afternoon.         Objective: Vitals:   07/10/20 2003 07/10/20 2300 07/11/20 0400 07/11/20 0724  BP: (!) 161/95 (!) 145/88 (!) 148/89 (!) 162/93  Pulse: 77 73 71 74  Resp: '18 18 18 16  ' Temp: 98.2 F (36.8 C) 98 F (36.7 C) 97.8 F (36.6 C) 98.5 F (36.9 C)  TempSrc:    Oral  SpO2: 98% 98% 98% 100%  Weight:      Height:        Intake/Output Summary (Last 24 hours) at 07/11/2020 1242 Last data filed at 07/11/2020 0300 Gross per 24 hour  Intake 200 ml  Output --  Net 200 ml   Filed Weights   07/09/20 1248  Weight: 111.1 kg    Exam:  . General: 54 y.o. year-old male well developed well  nourished in no acute distress.  Alert and oriented x3. . Cardiovascular: Regular rate and rhythm with no rubs or gallops.  No thyromegaly or JVD noted.   Marland Kitchen Respiratory: Clear to auscultation with no wheezes or rales. Good inspiratory effort. . Abdomen: Soft nontender nondistended with normal bowel sounds x4  quadrants. . Musculoskeletal: No lower extremity edema. 2/4 pulses in all 4 extremities. . Skin: Right great toe wound.  Dorsalis pedis pulses bilaterally. Marland Kitchen Psychiatry: Mood is appropriate for condition and setting   Data Reviewed: CBC: Recent Labs  Lab 07/09/20 1258 07/10/20 0534  WBC 6.1 5.6  NEUTROABS 4.4  --   HGB 14.8 14.7  HCT 43.1 43.6  MCV 88.1 86.9  PLT 257 741   Basic Metabolic Panel: Recent Labs  Lab 07/09/20 1258 07/10/20 0534 07/11/20 0524  NA 137 134* 133*  K 4.0 4.1 5.0  CL 103 101 100  CO2 '25 25 27  ' GLUCOSE 182* 141* 231*  BUN '10 9 10  ' CREATININE 0.73 0.71 0.84  CALCIUM 11.1* 11.4* 11.8*   GFR: Estimated Creatinine Clearance: 135.2 mL/min (by C-G formula based on SCr of 0.84 mg/dL). Liver Function Tests: Recent Labs  Lab 07/09/20 1258  AST 23  ALT 23  ALKPHOS 96  BILITOT 1.1  PROT 7.5  ALBUMIN 3.8   No results for input(s): LIPASE, AMYLASE in the last 168 hours. No results for input(s): AMMONIA in the last 168 hours. Coagulation Profile: No results for input(s): INR, PROTIME in the last 168 hours. Cardiac Enzymes: No results for input(s): CKTOTAL, CKMB, CKMBINDEX, TROPONINI in the last 168 hours. BNP (last 3 results) No results for input(s): PROBNP in the last 8760 hours. HbA1C: Recent Labs    07/09/20 1258  HGBA1C 6.7*   CBG: Recent Labs  Lab 07/10/20 1325 07/10/20 1631 07/10/20 2135 07/11/20 0724 07/11/20 1140  GLUCAP 261* 166* 187* 212* 184*   Lipid Profile: No results for input(s): CHOL, HDL, LDLCALC, TRIG, CHOLHDL, LDLDIRECT in the last 72 hours. Thyroid Function Tests: No results for input(s): TSH, T4TOTAL, FREET4, T3FREE, THYROIDAB in the last 72 hours. Anemia Panel: No results for input(s): VITAMINB12, FOLATE, FERRITIN, TIBC, IRON, RETICCTPCT in the last 72 hours. Urine analysis:    Component Value Date/Time   COLORURINE YELLOW (A) 07/09/2020 2155   APPEARANCEUR CLEAR (A) 07/09/2020 2155   LABSPEC 1.017  07/09/2020 2155   PHURINE 6.0 07/09/2020 2155   GLUCOSEU 150 (A) 07/09/2020 2155   HGBUR SMALL (A) 07/09/2020 2155   BILIRUBINUR NEGATIVE 07/09/2020 2155   KETONESUR NEGATIVE 07/09/2020 2155   PROTEINUR NEGATIVE 07/09/2020 2155   NITRITE NEGATIVE 07/09/2020 2155   LEUKOCYTESUR NEGATIVE 07/09/2020 2155   Sepsis Labs: '@LABRCNTIP' (procalcitonin:4,lacticidven:4)  ) Recent Results (from the past 240 hour(s))  Respiratory Panel by RT PCR (Flu A&B, Covid) - Nasopharyngeal Swab     Status: None   Collection Time: 07/09/20  9:28 PM   Specimen: Nasopharyngeal Swab  Result Value Ref Range Status   SARS Coronavirus 2 by RT PCR NEGATIVE NEGATIVE Final    Comment: (NOTE) SARS-CoV-2 target nucleic acids are NOT DETECTED.  The SARS-CoV-2 RNA is generally detectable in upper respiratoy specimens during the acute phase of infection. The lowest concentration of SARS-CoV-2 viral copies this assay can detect is 131 copies/mL. A negative result does not preclude SARS-Cov-2 infection and should not be used as the Russell basis for treatment or other patient management decisions. A negative result may occur with  improper specimen collection/handling, submission of specimen other than  nasopharyngeal swab, presence of viral mutation(s) within the areas targeted by this assay, and inadequate number of viral copies (<131 copies/mL). A negative result must be combined with clinical observations, patient history, and epidemiological information. The expected result is Negative.  Fact Sheet for Patients:  PinkCheek.be  Fact Sheet for Healthcare Providers:  GravelBags.it  This test is no t yet approved or cleared by the [place] FDA and  has been authorized for detection and/or diagnosis of SARS-CoV-2 by FDA under an Emergency Use Authorization (EUA). This EUA will remain  in effect (meaning this test can be used) for the duration of  the COVID-19 declaration under Section 564(b)(1) of the Act, 21 U.S.C. section 360bbb-3(b)(1), unless the authorization is terminated or revoked sooner.     Influenza A by PCR NEGATIVE NEGATIVE Final   Influenza B by PCR NEGATIVE NEGATIVE Final    Comment: (NOTE) The Xpert Xpress SARS-CoV-2/FLU/RSV assay is intended as an aid in  the diagnosis of influenza from Nasopharyngeal swab specimens and  should not be used as a Russell basis for treatment. Nasal washings and  aspirates are unacceptable for Xpert Xpress SARS-CoV-2/FLU/RSV  testing.  Fact Sheet for Patients: PinkCheek.be  Fact Sheet for Healthcare Providers: GravelBags.it  This test is not yet approved or cleared by the [place] FDA and  has been authorized for detection and/or diagnosis of SARS-CoV-2 by  FDA under an Emergency Use Authorization (EUA). This EUA will remain  in effect (meaning this test can be used) for the duration of the  Covid-19 declaration under Section 564(b)(1) of the Act, 21  U.S.C. section 360bbb-3(b)(1), unless the authorization is  terminated or revoked. Performed at [facility] [address], [place], [place] [address]   Surgical pcr screen     Status: None   Collection Time: 07/10/20  4:20 PM   Specimen: Nasal Mucosa; Nasal Swab  Result Value Ref Range Status   MRSA, PCR NEGATIVE NEGATIVE Final   Staphylococcus aureus NEGATIVE NEGATIVE Final    Comment: (NOTE) The Xpert SA Assay (FDA approved for NASAL specimens in patients 55 years of age and older), is one component of a comprehensive surveillance program. It is not intended to diagnose infection nor to guide or monitor treatment. Performed at [facility], [address] [place], [place] [address]       Studies: No results found.  Scheduled Meds: . amLODipine  10 mg Oral Daily  . atorvastatin  40 mg Oral q1800  . Chlorhexidine Gluconate Cloth   6 each Topical Daily  . influenza vac split quadrivalent PF  0.5 mL Intramuscular Tomorrow-1000  . insulin aspart  0-5 Units Subcutaneous QHS  . insulin aspart  0-9 Units Subcutaneous TID WC  . losartan  50 mg Oral Daily  . povidone-iodine  2 application Topical Once    Continuous Infusions: . cefTRIAXone (ROCEPHIN)  IV 2 g (07/11/20 0900)  . vancomycin 1,000 mg (07/11/20 0954)     LOS: 2 days     Kayleen Memos, MD [facility] Pager [phone]  If 7PM-7AM, please contact night-coverage [email or web address] Password [email or web address] 07/11/2020, 12:42 PM

## 2020-07-11 NOTE — Progress Notes (Signed)
Pt returned from PACU in stable condition.  No complaint of pain.  Pt resting comfortably.  Bed in lowest position.

## 2020-07-11 NOTE — Anesthesia Procedure Notes (Signed)
Procedure Name: LMA Insertion Date/Time: 07/11/2020 3:21 PM Performed by: Johnna Acosta, CRNA Pre-anesthesia Checklist: Patient identified, Emergency Drugs available, Suction available, Patient being monitored and Timeout performed Patient Re-evaluated:Patient Re-evaluated prior to induction Oxygen Delivery Method: Circle system utilized Preoxygenation: Pre-oxygenation with 100% oxygen Induction Type: IV induction LMA: LMA inserted LMA Size: 4.5 Tube type: Oral Number of attempts: 1 Airway Equipment and Method: Oral airway Placement Confirmation: positive ETCO2 and breath sounds checked- equal and bilateral Tube secured with: Tape Dental Injury: Teeth and Oropharynx as per pre-operative assessment

## 2020-07-11 NOTE — Op Note (Signed)
Operative note   Surgeon:Kiam Bransfield Lawyer: None    Preop diagnosis: Osteomyelitis right great toe    Postop diagnosis: Same    Procedure: 1.  Amputation right great toe metatarsophalangeal joint 2.  Use of intraoperative fluoroscopy without the assistance of radiology review    EBL: Minimal    Anesthesia:local and general    Hemostasis: Ankle tourniquet inflated to 200 mmHg for 12 minutes    Specimen: Bone for culture and amputated right great toe for pathology    Complications: None    Operative indications:[name] is an 54 y.o. that presents today for surgical intervention.  The risks/benefits/alternatives/complications have been discussed and consent has been given.    Procedure:  Patient was brought into the OR and placed on the operating table in thesupine position. After anesthesia was obtained theright lower extremity was prepped and draped in usual sterile fashion.  Attention was directed to the right great toe where the large open ulceration at the interphalangeal joint was noted.  Dorsal and plantar skin flaps were created at the level of the metatarsophalangeal joint and full-thickness incisions were performed.  Sharp dissection was carried to the metatarsophalangeal joint and the toe was disarticulated.  This was sent for pathological examination and a portion of the bone sent for bone culture.  The wound was then flushed with copious amounts of irrigation.  The tourniquet was released.  All bleeders were Bovie cauterized.  The wound was flushed once again and closure was performed with a 3-0 nylon to the skin.  A bulky sterile dressing was applied to the right foot.    Patient tolerated the procedure and anesthesia well.  Was transported from the OR to the PACU with all vital signs stable and vascular status intact. To be discharged per routine protocol.  Will follow up in approximately 1 week in the outpatient clinic.

## 2020-07-12 ENCOUNTER — Encounter: Payer: Self-pay | Admitting: Podiatry

## 2020-07-12 DIAGNOSIS — M869 Osteomyelitis, unspecified: Secondary | ICD-10-CM | POA: Diagnosis not present

## 2020-07-12 DIAGNOSIS — E119 Type 2 diabetes mellitus without complications: Secondary | ICD-10-CM | POA: Diagnosis not present

## 2020-07-12 DIAGNOSIS — E785 Hyperlipidemia, unspecified: Secondary | ICD-10-CM | POA: Diagnosis not present

## 2020-07-12 DIAGNOSIS — I10 Essential (primary) hypertension: Secondary | ICD-10-CM | POA: Diagnosis not present

## 2020-07-12 LAB — CBC
HCT: 44.3 % (ref 39.0–52.0)
Hemoglobin: 15.3 g/dL (ref 13.0–17.0)
MCH: 30.1 pg (ref 26.0–34.0)
MCHC: 34.5 g/dL (ref 30.0–36.0)
MCV: 87 fL (ref 80.0–100.0)
Platelets: 261 10*3/uL (ref 150–400)
RBC: 5.09 MIL/uL (ref 4.22–5.81)
RDW: 11.4 % — ABNORMAL LOW (ref 11.5–15.5)
WBC: 9.8 10*3/uL (ref 4.0–10.5)
nRBC: 0 % (ref 0.0–0.2)

## 2020-07-12 LAB — GLUCOSE, CAPILLARY
Glucose-Capillary: 267 mg/dL — ABNORMAL HIGH (ref 70–99)
Glucose-Capillary: 193 mg/dL — ABNORMAL HIGH (ref 70–99)
Glucose-Capillary: 243 mg/dL — ABNORMAL HIGH (ref 70–99)
Glucose-Capillary: 194 mg/dL — ABNORMAL HIGH (ref 70–99)

## 2020-07-12 LAB — BASIC METABOLIC PANEL
Anion gap: 9 (ref 5–15)
BUN: 13 mg/dL (ref 6–20)
CO2: 23 mmol/L (ref 22–32)
Calcium: 11.4 mg/dL — ABNORMAL HIGH (ref 8.9–10.3)
Chloride: 100 mmol/L (ref 98–111)
Creatinine, Ser: 0.72 mg/dL (ref 0.61–1.24)
GFR, Estimated: >60 mL/min (ref >60)
Glucose, Bld: 309 mg/dL — ABNORMAL HIGH (ref 70–99)
Potassium: 4.5 mmol/L (ref 3.5–5.1)
Sodium: 132 mmol/L — ABNORMAL LOW (ref 135–145)

## 2020-07-12 LAB — PHOSPHORUS: Phosphorus: 1.9 mg/dL — ABNORMAL LOW (ref 2.5–4.6)

## 2020-07-12 MED ORDER — CIPROFLOXACIN HCL 500 MG PO TABS
500.0000 mg | ORAL_TABLET | Freq: Two times a day (BID) | ORAL | Status: DC
  Filled 2020-07-12 (×2): qty 1

## 2020-07-12 MED ORDER — ENOXAPARIN SODIUM 60 MG/0.6ML ~~LOC~~ SOLN
0.5000 mg/kg | SUBCUTANEOUS | Status: DC
  Administered 2020-07-12: 55 mg via SUBCUTANEOUS
  Filled 2020-07-12 (×2): qty 0.6

## 2020-07-12 MED ORDER — CIPROFLOXACIN HCL 500 MG PO TABS
250.0000 mg | ORAL_TABLET | Freq: Two times a day (BID) | ORAL | Status: DC
  Administered 2020-07-12: 250 mg via ORAL
  Filled 2020-07-12 (×2): qty 0.5

## 2020-07-12 MED ORDER — SODIUM CHLORIDE 0.9 % IV SOLN: INTRAVENOUS | Status: DC

## 2020-07-12 MED ORDER — SODIUM CHLORIDE 0.9 % IV SOLN
3.0000 g | Freq: Four times a day (QID) | INTRAVENOUS | Status: DC
  Administered 2020-07-12 – 2020-07-13 (×3): 3 g via INTRAVENOUS
  Filled 2020-07-12: qty 3
  Filled 2020-07-12 (×3): qty 8
  Filled 2020-07-12: qty 3
  Filled 2020-07-12: qty 8

## 2020-07-12 MED ORDER — POTASSIUM & SODIUM PHOSPHATES 280-160-250 MG PO PACK
1.0000 | PACK | Freq: Two times a day (BID) | ORAL | Status: AC
  Administered 2020-07-12 (×2): 1 via ORAL
  Filled 2020-07-12 (×3): qty 1

## 2020-07-12 MED ORDER — VANCOMYCIN HCL IN DEXTROSE 1-5 GM/200ML-% IV SOLN
1000.0000 mg | Freq: Three times a day (TID) | INTRAVENOUS | Status: DC
  Administered 2020-07-12 – 2020-07-13 (×2): 1000 mg via INTRAVENOUS
  Filled 2020-07-12 (×5): qty 200

## 2020-07-12 MED ORDER — DOXYCYCLINE HYCLATE 100 MG PO TABS
100.0000 mg | ORAL_TABLET | Freq: Two times a day (BID) | ORAL | Status: DC
  Administered 2020-07-12: 100 mg via ORAL
  Filled 2020-07-12: qty 1

## 2020-07-12 NOTE — Consult Note (Signed)
[facility]  CONSULT NOTE    Date: 07/12/2020                  Patient Name:  [name]  MRN: [id number]  DOB: 1966-04-16  Age / Sex: 53 y.o., male         PCP: Valera Castle, MD                 Service Requesting Consult: Dr. Nevada Crane                 Reason for Consult: Hypercalcemia            History of Present Illness: [name] underwent amputation of his right first toe on 10/13 by Dr. Vickki Muff.  [name] was found to have a right first toe wound. Was being treated as outpatient with dicloxacillin. Confirmed by radiology to be osteomyelitis. Started on broad spectrum antibiotics and admitted to [facility].  Nephrology was consulted for hypercalcemia. Patient states he was aware that his calcium was elevated but does not recall any type of work up for this. Denies any use of thiazide diuretics, denies excessive vitamin D or calcium supplementation, no acid reflux, no myalgias or myopathy, no psychiatric symptoms.    Medications: Outpatient medications: Medications Prior to Admission  Medication Sig Dispense Refill Last Dose  . atorvastatin (LIPITOR) 40 MG tablet Take 40 mg by mouth daily.   07/09/2020 at 0600  . dicloxacillin (DYNAPEN) 500 MG capsule Take 1 capsule by mouth every 6 (six) hours. For 10 days   07/09/2020 at 01800  . GLIPIZIDE XL 10 MG 24 hr tablet Take 2 tablets by mouth daily.   11 07/09/2020 at 0600  . losartan (COZAAR) 50 MG tablet Take 50 mg by mouth daily.   07/09/2020 at 0600  . amLODipine (NORVASC) 10 MG tablet Take 1 tablet (10 mg total) by mouth daily. (Patient not taking: Reported on 07/09/2020) 30 tablet 0 Not Taking at Unknown time  . amoxicillin-clavulanate (AUGMENTIN) 875-125 MG tablet Take 1 tablet by mouth 2 (two) times daily. (Patient not taking: Reported on 07/09/2020) 28 tablet 0 Not Taking at Unknown time  . ibuprofen (ADVIL,MOTRIN) 200 MG tablet Take 200-400 mg by mouth every 6 (six) hours as needed.  (Patient not taking: Reported on 07/09/2020)   Not Taking at Unknown time  . lisinopril (PRINIVIL,ZESTRIL) 20 MG tablet Take 1 tablet (20 mg total) by mouth daily. (Patient not taking: Reported on 07/09/2020) 30 tablet 0 Not Taking at Unknown time  . omega-3 acid ethyl esters (LOVAZA) 1 g capsule Take 1 g by mouth every other day. (Patient not taking: Reported on 07/09/2020)   Not Taking at Unknown time  . oxyCODONE (OXY IR/ROXICODONE) 5 MG immediate release tablet Take 1 tablet (5 mg total) by mouth every 4 (four) hours as needed for moderate pain. (Patient not taking: Reported on 07/09/2020) 30 tablet 0 Not Taking at Unknown time    Current medications: Current Facility-Administered Medications  Medication Dose Route Frequency Provider Last Rate Last Admin  . 0.9 %  sodium chloride infusion   Intravenous Continuous Samara Deist, DPM 400 mL/hr at 07/11/20 1558 Rate Change at 07/11/20 1558  . amLODipine (NORVASC) tablet 10 mg  10 mg Oral Daily Samara Deist, DPM   10 mg at 07/12/20 0902  . atorvastatin (LIPITOR) tablet 40 mg  40 mg Oral q1800 Samara Deist, DPM   40 mg at 07/11/20 1735  . Chlorhexidine Gluconate Cloth  2 % PADS 6 each  6 each Topical Daily Samara Deist, DPM   6 each at 07/12/20 0905  . ciprofloxacin (CIPRO) tablet 250 mg  250 mg Oral BID Irene Pap N, DO   250 mg at 07/12/20 6440  . doxycycline (VIBRA-TABS) tablet 100 mg  100 mg Oral Q12H Hall, Carole N, DO   100 mg at 07/12/20 3474  . influenza vac split quadrivalent PF (FLUARIX) injection 0.5 mL  0.5 mL Intramuscular Tomorrow-1000 Nicole Kindred A, DO      . insulin aspart (novoLOG) injection 0-5 Units  0-5 Units Subcutaneous QHS Samara Deist, DPM   3 Units at 07/11/20 2126  . insulin aspart (novoLOG) injection 0-9 Units  0-9 Units Subcutaneous TID WC Samara Deist, DPM   5 Units at 07/12/20 0901  . losartan (COZAAR) tablet 50 mg  50 mg Oral Daily Samara Deist, DPM   50 mg at 07/12/20 0916  . oxyCODONE (Oxy  IR/ROXICODONE) immediate release tablet 5 mg  5 mg Oral Q4H PRN Samara Deist, DPM   5 mg at 07/10/20 0813      Allergies: No Known Allergies    Past Medical History: Past Medical History:  Diagnosis Date  . Diabetes ([place])   . Hypertension      Past Surgical History: Past Surgical History:  Procedure Laterality Date  . AMPUTATION TOE Right 07/11/2020   Procedure: AMPUTATION TOE-Right Great Toe;  Surgeon: Samara Deist, DPM;  Location: [facility] ORS;  Service: Podiatry;  Laterality: Right;  . IRRIGATION AND DEBRIDEMENT ABSCESS Right 10/31/2018   Procedure: IRRIGATION AND DEBRIDEMENT ABSCESS;  Surgeon: Samara Deist, DPM;  Location: [facility] ORS;  Service: Podiatry;  Laterality: Right;  . IRRIGATION AND DEBRIDEMENT FOOT Right 11/02/2018   Procedure: IRRIGATION AND DEBRIDEMENT FOOT;  Surgeon: Sharlotte Alamo, DPM;  Location: [facility] ORS;  Service: Podiatry;  Laterality: Right;     Family History: Family History  Problem Relation Age of Onset  . CAD Father   . CAD Brother      Social History: Social History   Socioeconomic History  . Marital status: Single    Spouse name: Not on file  . Number of children: Not on file  . Years of education: Not on file  . Highest education level: Not on file  Occupational History  . Not on file  Tobacco Use  . Smoking status: Light Tobacco Smoker    Types: Cigars  . Smokeless tobacco: Never Used  Vaping Use  . Vaping Use: Never used  Substance and Sexual Activity  . Alcohol use: Yes    Comment: "too much"   . Drug use: Never  . Sexual activity: Yes  Other Topics Concern  . Not on file  Social History Narrative  . Not on file   Social Determinants of Health   Financial Resource Strain:   . Difficulty of Paying Living Expenses: Not on file  Food Insecurity:   . Worried About Charity fundraiser in the Last Year: Not on file  . Ran Out of Food in the Last Year: Not on file  Transportation Needs:   . Lack of Transportation (Medical): Not  on file  . Lack of Transportation (Non-Medical): Not on file  Physical Activity:   . Days of Exercise per Week: Not on file  . Minutes of Exercise per Session: Not on file  Stress:   . Feeling of Stress : Not on file  Social Connections:   . Frequency of Communication with Friends and Family:  Not on file  . Frequency of Social Gatherings with Friends and Family: Not on file  . Attends Religious Services: Not on file  . Active Member of Clubs or Organizations: Not on file  . Attends Archivist Meetings: Not on file  . Marital Status: Not on file  Intimate Partner Violence:   . Fear of Current or Ex-Partner: Not on file  . Emotionally Abused: Not on file  . Physically Abused: Not on file  . Sexually Abused: Not on file     Review of Systems: Review of Systems  Constitutional: Negative.  Negative for chills, diaphoresis, fever, malaise/fatigue and weight loss.  HENT: Negative.   Eyes: Negative.   Respiratory: Negative for cough, hemoptysis, sputum production, shortness of breath and wheezing.   Cardiovascular: Negative for chest pain, palpitations, orthopnea, claudication, leg swelling and PND.  Gastrointestinal: Negative.   Genitourinary: Negative for dysuria, flank pain, frequency, hematuria and urgency.  Musculoskeletal: Negative for back pain, falls, joint pain, myalgias and neck pain.  Skin: Negative.   Neurological: Negative.  Negative for dizziness, seizures, weakness and headaches.  Endo/Heme/Allergies: Negative.   Psychiatric/Behavioral: Negative.  Negative for depression and memory loss. The patient is not nervous/anxious and does not have insomnia.     Vital Signs: Blood pressure (!) 151/91, pulse 81, temperature 98.4 F (36.9 C), temperature source Oral, resp. rate 18, height '6\' 3"'  (1.905 m), weight 111.1 kg, SpO2 99 %.  Weight trends: Filed Weights   07/09/20 1248  Weight: 111.1 kg    Physical Exam: General: NAD, working with physical therapy   Head: Normocephalic, atraumatic. Moist oral mucosal membranes  Eyes: Anicteric, PERRL  Neck: Supple, trachea midline  Lungs:  Clear to auscultation  Heart: Regular rate and rhythm  Abdomen:  Soft, nontender  Extremities:  right foot in dressings - clean and dry  Neurologic: Nonfocal, moving all four extremities  Skin: No lesions         Lab results: Basic Metabolic Panel: Recent Labs  Lab 07/10/20 0534 07/11/20 0524 07/12/20 0420 07/12/20 0655  NA 134* 133* 132*  --   K 4.1 5.0 4.5  --   CL 101 100 100  --   CO2 '25 27 23  ' --   GLUCOSE 141* 231* 309*  --   BUN '9 10 13  ' --   CREATININE 0.71 0.84 0.72  --   CALCIUM 11.4* 11.8* 11.4*  --   PHOS  --   --   --  1.9*    Liver Function Tests: Recent Labs  Lab 07/09/20 1258  AST 23  ALT 23  ALKPHOS 96  BILITOT 1.1  PROT 7.5  ALBUMIN 3.8   No results for input(s): LIPASE, AMYLASE in the last 168 hours. No results for input(s): AMMONIA in the last 168 hours.  CBC: Recent Labs  Lab 07/09/20 1258 07/10/20 0534 07/12/20 0430  WBC 6.1 5.6 9.8  NEUTROABS 4.4  --   --   HGB 14.8 14.7 15.3  HCT 43.1 43.6 44.3  MCV 88.1 86.9 87.0  PLT 257 226 261    Cardiac Enzymes: No results for input(s): CKTOTAL, CKMB, CKMBINDEX, TROPONINI in the last 168 hours.  BNP: Invalid input(s): POCBNP  CBG: Recent Labs  Lab 07/11/20 1506 07/11/20 1613 07/11/20 1659 07/11/20 2117 07/12/20 0727  GLUCAP 155* 156* 156* 283* 267*    Microbiology: Results for orders placed or performed during the hospital encounter of 07/09/20  Respiratory Panel by RT PCR (Flu A&B, Covid) -  Nasopharyngeal Swab     Status: None   Collection Time: 07/09/20  9:28 PM   Specimen: Nasopharyngeal Swab  Result Value Ref Range Status   SARS Coronavirus 2 by RT PCR NEGATIVE NEGATIVE Final    Comment: (NOTE) SARS-CoV-2 target nucleic acids are NOT DETECTED.  The SARS-CoV-2 RNA is generally detectable in upper respiratoy specimens during the acute phase  of infection. The lowest concentration of SARS-CoV-2 viral copies this assay can detect is 131 copies/mL. A negative result does not preclude SARS-Cov-2 infection and should not be used as the sole basis for treatment or other patient management decisions. A negative result may occur with  improper specimen collection/handling, submission of specimen other than nasopharyngeal swab, presence of viral mutation(s) within the areas targeted by this assay, and inadequate number of viral copies (<131 copies/mL). A negative result must be combined with clinical observations, patient history, and epidemiological information. The expected result is Negative.  Fact Sheet for Patients:  PinkCheek.be  Fact Sheet for Healthcare Providers:  GravelBags.it  This test is no t yet approved or cleared by the [place] FDA and  has been authorized for detection and/or diagnosis of SARS-CoV-2 by FDA under an Emergency Use Authorization (EUA). This EUA will remain  in effect (meaning this test can be used) for the duration of the COVID-19 declaration under Section 564(b)(1) of the Act, 21 U.S.C. section 360bbb-3(b)(1), unless the authorization is terminated or revoked sooner.     Influenza A by PCR NEGATIVE NEGATIVE Final   Influenza B by PCR NEGATIVE NEGATIVE Final    Comment: (NOTE) The Xpert Xpress SARS-CoV-2/FLU/RSV assay is intended as an aid in  the diagnosis of influenza from Nasopharyngeal swab specimens and  should not be used as a sole basis for treatment. Nasal washings and  aspirates are unacceptable for Xpert Xpress SARS-CoV-2/FLU/RSV  testing.  Fact Sheet for Patients: PinkCheek.be  Fact Sheet for Healthcare Providers: GravelBags.it  This test is not yet approved or cleared by the [place] FDA and  has been authorized for detection and/or diagnosis of  SARS-CoV-2 by  FDA under an Emergency Use Authorization (EUA). This EUA will remain  in effect (meaning this test can be used) for the duration of the  Covid-19 declaration under Section 564(b)(1) of the Act, 21  U.S.C. section 360bbb-3(b)(1), unless the authorization is  terminated or revoked. Performed at [facility], [address] [place], [place] [address]   Surgical pcr screen     Status: None   Collection Time: 07/10/20  4:20 PM   Specimen: Nasal Mucosa; Nasal Swab  Result Value Ref Range Status   MRSA, PCR NEGATIVE NEGATIVE Final   Staphylococcus aureus NEGATIVE NEGATIVE Final    Comment: (NOTE) The Xpert SA Assay (FDA approved for NASAL specimens in patients 59 years of age and older), is one component of a comprehensive surveillance program. It is not intended to diagnose infection nor to guide or monitor treatment. Performed at [facility], [address] [place] [address]   Aerobic/Anaerobic Culture (surgical/deep wound)     Status: None (Preliminary result)   Collection Time: 07/11/20  3:43 PM   Specimen: PATH Digit amputation; Tissue  Result Value Ref Range Status   Specimen Description   Final    WOUND Performed at [name], [address] [place], [place] [address]    Special Requests   Final    DIGIT AMPUTATION Performed at [facility], [place],  [place] [address]    Gram Stain   Final    FEW WBC PRESENT,BOTH PMN AND MONONUCLEAR NO ORGANISMS SEEN    Culture   Final    CULTURE REINCUBATED FOR BETTER GROWTH Performed at [facility], [place] [address], [place], [place] [address]    Report Status PENDING  Incomplete    Coagulation Studies: No results for input(s): LABPROT, INR in the last 72 hours.  Urinalysis: Recent Labs    07/09/20 2155  COLORURINE YELLOW*  LABSPEC 1.017  PHURINE 6.0  GLUCOSEU 150*  HGBUR SMALL*  BILIRUBINUR NEGATIVE  KETONESUR NEGATIVE  PROTEINUR  NEGATIVE  NITRITE NEGATIVE  LEUKOCYTESUR NEGATIVE      Imaging: DG MINI C-ARM IMAGE ONLY  Result Date: 07/11/2020 There is no interpretation for this exam.  This order is for images obtained during a surgical procedure.  Please See "Surgeries" Tab for more information regarding the procedure.      Assessment & Plan: [name] is a 54 y.o. white male with hypertension, diabetes mellitus type II, diabetic neuropathy, hyperlipidemia, peripheral vascular disease, history of amputation of right fifth toe who was admitted to [facility] on 07/09/2020 for Osteomyelitis [facility]) [M86.9] Osteomyelitis of right foot, unspecified type [facility]) [M86.9]  Nephrology consulted for hypercalcemia  1. Hypercalcemia: long standing since 2019. Elevated PTH at 80 with normal kidney function.  Differential includes primary hyperparathyroidism, multiple myeloma or other malignancy, milk-alkali syndrome,  granulomatous disease, or vitamin D intoxication.  However with elevated PTH, most likely primary hyperparathyroidism.  - Check phos, SPEP/UPEP, vitamin D level.  - Patient will need outpatient work up. Will need a nuclear PTH uptake scan.   2. Hypertension: elevated. Home regimen of losartan, amlodipine, and lisinopril. Patient should not be on both an ACE-I and an ARB at the same time.  - Agree with continuation of amlodipine and losartan.   3. 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 Hemoglobin AA1c of 6.7% on admission.   4. Hyponatremia: pseudohyponatremia secondary to hyperglycemia. Corrected sodium of 137.      LOS: 3 Chassidy Layson 10/14/202111:31 AM

## 2020-07-12 NOTE — Progress Notes (Signed)
Daily Progress Note   Subjective  - 1 Day Post-Op  Great toe amputation.  He is doing well.  Objective Vitals:   07/11/20 2135 07/11/20 2311 07/12/20 0344 07/12/20 0723  BP: (!) 147/98 135/89 (!) 152/96 (!) 151/91  Pulse: 89 96 79 81  Resp: 18 18 18 18   Temp: 98 F (36.7 C) 98.7 F (37.1 C) 98 F (36.7 C) 98.4 F (36.9 C)  TempSrc:   Oral Oral  SpO2: 98% 93% 96% 99%  Weight:      Height:        Physical Exam: Incision is completely coapted.  No dehiscence.  Erythema is minimal.  No purulence.  No lymphangitic streaking.  Culture: Intraoperative bone culture so far no growth.  Laboratory CBC    Component Value Date/Time   WBC 9.8 07/12/2020 0430   HGB 15.3 07/12/2020 0430   HCT 44.3 07/12/2020 0430   PLT 261 07/12/2020 0430    BMET    Component Value Date/Time   NA 132 (L) 07/12/2020 0420   K 4.5 07/12/2020 0420   CL 100 07/12/2020 0420   CO2 23 07/12/2020 0420   GLUCOSE 309 (H) 07/12/2020 0420   BUN 13 07/12/2020 0420   CREATININE 0.72 07/12/2020 0420   CALCIUM 11.4 (H) 07/12/2020 0420   GFRNONAA >60 07/12/2020 0420   GFRAA >60 06/12/2019 1556    Assessment/Planning: Osteomyelitis status post amputation great toe right foot   Dressing change.  Wound is stable.  Will follow-up tomorrow.  Hopefully can get a preliminary on the culture.  Suspect patient will be able to discharge tomorrow on oral antibiotics.  Can always follow culture outpatient.  Continue with minimal weightbearing to the forefoot.  Ideally nonweightbearing to the right foot.  Should not need home health for dressing changes.  Samara Deist A  07/12/2020, 1:17 PM

## 2020-07-12 NOTE — Evaluation (Signed)
Physical Therapy Evaluation Patient Details Name: [name] MRN: [id number] DOB: 1966/09/22 Today's Date: 07/12/2020   History of Present Illness  [name] is a 54 y.o. male with medical history significant for type 2 diabetes with peripheral neuropathy, PAD, hypertension, history of osteomyelitis s/p amputation of the right 5th toe. MD assessment includes osteomyelitis ans is s/p Amputation right great toe metatarsophalangeal joint.  Clinical Impression  Pt was pleasant and motivated to participate during the session. Pt was alert and oriented. Hx was taken, and pt was educated on NWB precautions for RLE, and a break was taken to allow for MD assessment. Upon return, pt was educated on sequencing and safety for RW and knee scooter. Pt was able to perform STS Mod IND to RW. Pt ambulated 15' x2 with RW and Mod IND while maintaining NWB precautions. Pt reported mild fatigue following ambulation. Pt was able to ambulate 100' on knee scooter Mod IND with good balance. Pt reported mildly increased fatigue, specifically in the quadriceps, following ambulation. Pt will benefit from HHPT services upon discharge to safely address deficits listed in patient problem list for decreased caregiver assistance and eventual return to PLOF.      Follow Up Recommendations Home health PT    Equipment Recommendations  Other (comment) (pt recommended knee scooter and transport/ WC chair. pt reported that he would purchase on his own)    Recommendations for Other Services       Precautions / Restrictions Restrictions Weight Bearing Restrictions: Yes RLE Weight Bearing: Non weight bearing Other Position/Activity Restrictions: Heel WB for transfers/ short functional distances, NWB for long distances per MD      Mobility  Bed Mobility Overal bed mobility: Independent                Transfers Overall transfer level: Modified independent Equipment used: Rolling walker (2 wheeled)              General transfer comment: pt able to easily come to standing and keep precautions  Ambulation/Gait Ambulation/Gait assistance: Modified independent (Device/Increase time) Gait Distance (Feet): 100 Feet x1 (knee scooter), 15 feet x2 (RW) Assistive device:  (knee scooter)       General Gait Details: pt able use knee scooter with no LOB and maintain precautions. pt reported fatigue in quad and reported that the scooter used for effort than he thought it would. Pt able to ambulate 15'x2 Mod IND with RW  Stairs            Wheelchair Mobility    Modified Rankin (Stroke Patients Only)       Balance Overall balance assessment: Needs assistance   Sitting balance-Leahy Scale: Normal     Standing balance support: Bilateral upper extremity supported;During functional activity Standing balance-Leahy Scale: Good Standing balance comment: pt able to perform static and dynamic activity while maintaining NWB on R LE.                             Pertinent Vitals/Pain Pain Assessment: No/denies pain    Home Living Family/patient expects to be discharged to:: Private residence Living Arrangements: Alone Available Help at Discharge: Friend(s);Available PRN/intermittently;Other (Comment) (girlfriend available to perform errands)   Home Access: Ramped entrance     Home Layout: One level Home Equipment: Daniel - 2 wheels;Cane - single point;Other (comment) (pt has knee scooter but may be unaccsessible to pt)      Prior Function Level of Independence: Independent  Comments: IND with mobility. 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    Extremity/Trunk Assessment                Communication   Communication: No difficulties  Cognition Arousal/Alertness: Awake/alert Behavior During Therapy: WFL for tasks assessed/performed Overall Cognitive Status: Within Functional Limits for tasks assessed                                  General Comments: pt plesant and displays normal affect, cognition, and behavior      General Comments      Exercises     Assessment/Plan    PT Assessment Patient needs continued PT services  PT Problem List Decreased mobility;Impaired sensation;Decreased balance       PT Treatment Interventions      PT Goals (Current goals can be found in the Care Plan section)  Acute Rehab PT Goals Patient Stated Goal: to return to teaching PT Goal Formulation: With patient Time For Goal Achievement: 07/25/20 Potential to Achieve Goals: Good    Frequency 7X/week   Barriers to discharge        Co-evaluation               AM-PAC PT "6 Clicks" Mobility  Outcome Measure Help needed turning from your back to your side while in a flat bed without using bedrails?: None Help needed moving from lying on your back to sitting on the side of a flat bed without using bedrails?: None Help needed moving to and from a bed to a chair (including a wheelchair)?: None Help needed standing up from a chair using your arms (e.g., wheelchair or bedside chair)?: None Help needed to walk in hospital room?: None Help needed climbing 3-5 steps with a railing? : A Little 6 Click Score: 23    End of Session Equipment Utilized During Treatment: Gait belt Activity Tolerance: Patient tolerated treatment well Patient left: in bed;with call bell/phone within reach Nurse Communication: Mobility status PT Visit Diagnosis: Difficulty in walking, not elsewhere classified (R26.2)    Time: 2122-4825 ([id number]) PT Time Calculation (min) (ACUTE ONLY): 9 min   Charges:              Hervey Ard, SPT 07/12/20. 11:32 AM

## 2020-07-12 NOTE — Progress Notes (Signed)
OT Screen Note  Patient Details Name: [name] MRN: [id number] DOB: 07/10/1966   Cancelled Treatment:    Reason Eval/Treat Not Completed: OT screened, no needs identified, will sign off. Consult received, chart reviewed. Pt pleasant, denies pain. Endorses having gone through something similar for his previous toe amputation but it was "more extensive." Pt independent at baseline, only somewhat limited by R foot pain prior to admission. Pt living in a 1st floor apt with ramp access, still has his knee scooter (in storage but can access it), and works out 3x/wk where he uses the walk-in showers. 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 Pt verbalizes plan for modified showering strategies to keep R foot dry, has stocked up on groceries, and has a girlfriend who can assist if needed. Pt educated in [place] precautions per Podiatry (Heel WB only for transfer/short distances, NWB with scooter or other assitive device for distances.) Pt verbalizes understanding and denies additional concerns or needs at this time. Will sign off. No skilled OT needs at this time.   Jeni Salles, MPH, MS, OTR/L ascom [phone] 07/12/20, 9:10 AM

## 2020-07-12 NOTE — Progress Notes (Signed)
Inpatient Diabetes Program Recommendations  AACE/ADA: New Consensus Statement on Inpatient Glycemic Control (2015)  Target Ranges:  Prepandial:   less than 140 mg/dL      Peak postprandial:   less than 180 mg/dL (1-2 hours)      Critically ill patients:  140 - 180 mg/dL   Lab Results  Component Value Date   GLUCAP 193 (H) 07/12/2020   HGBA1C 6.7 (H) 07/09/2020    Review of Glycemic Control  Inpatient Diabetes Program Recommendations:   Spoke with patient @ bedside and answered questions primarily regarding nutrition and patient currently does lower carbohydrates on most foods. Reviewed questions regarding meal carbohydrates, drinks, fruits, and snacks. Patient verbalized understanding and has no further questions.  Thank you, Nani Gasser. Jaysha Lasure, RN, MSN, CDE  Diabetes Coordinator Inpatient Glycemic Control Team Team Pager [phone] (8am-5pm) 07/12/2020 2:49 PM

## 2020-07-12 NOTE — Consult Note (Signed)
Pharmacy Antibiotic Note  [name] is a 54 y.o. 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  Pharmacy has been consulted for vancomycin and Unasyn dosing. Since admission renal function has been stable and at apparent baseline. Vancomycin was stopped this morning and now being restarted.  Plan:   1) start Unasyn 3 grams IV every 6 hours  2) start vancomycin 1000 mg IV Q 8 hrs  Ke 0.109 h-1, T1/2: 6.4 h  Daily SCr while on vancomycin to assess renal function  vancomycin levels as clinically indicated  Height: 6\' 3"  (190.5 cm) Weight: 111.1 kg (245 lb) IBW/kg (Calculated) : 84.5  Temp (24hrs), Avg:98.4 F (36.9 C), Min:98 F (36.7 C), Max:98.7 F (37.1 C)  Recent Labs  Lab 07/09/20 1258 07/10/20 0534 07/11/20 0524 07/12/20 0420 07/12/20 0430  WBC 6.1 5.6  --   --  9.8  CREATININE 0.73 0.71 0.84 0.72  --   LATICACIDVEN 1.1  --   --   --   --     Estimated Creatinine Clearance: 142 mL/min (by C-G formula based on SCr of 0.72 mg/dL).    No Known Allergies  Antimicrobials this admission: ceftriaxone 10/12 >> 10/13 vancomycin 10/11 >>  Unasyn 10/14 >>  Microbiology results: 10/13 WCx: pending 10/11 SARS CoV-2: negative  10/11 influenza A/B: negative 10/12 MRSA PCR: negative  Thank you for allowing pharmacy to be a part of this patient's care.  Dallie Piles 07/12/2020 5:05 PM

## 2020-07-12 NOTE — Plan of Care (Signed)
  Problem: Education: Goal: Ability to describe self-care measures that may prevent or decrease complications (Diabetes Survival Skills Education) will improve Outcome: Progressing   Problem: Coping: Goal: Ability to adjust to condition or change in health will improve Outcome: Progressing   

## 2020-07-12 NOTE — Progress Notes (Addendum)
PROGRESS NOTE  [name] VFI:[id number] DOB: 06-13-1966 DOA: 07/09/2020 PCP: Valera Castle, MD  HPI/Recap of past 24 hours: [name] a 54 y.o.malewith medical history significant fortype 2 diabeteswith peripheral neuropathy,PAD,hypertension, history of osteomyelitiss/p amputation of the right 5th toewho presented to the ED on 07/09/20 with worsening wound of the right great toe.  The initially injury was 3 weeks prior, scraped dorsal aspect of the R great toe.  Seen by PCP 5 days prior to presenting, prescribed dicloxacillin.  Outpatient Xray showed probable R great toe osteomyelitis.    In the ED, afebrile, BP uncontrolled with systolic BP up to 416'S.  No leukocytosis on CBC.  MRI confirmed showing osteomyelitis throughout the R great toe with bony destructive change about the IP joint compatible with septic joint. Negative for myositis or abscess.  First MTP and midfoot osteoarthritis. Given IV Vancomycin and Zosyn in the ED.    Admitted to hospitalist service.  On IV Vancomycin and Rocephin currently.  Podiatry consulted.  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 Vickki Muff.  07/12/20: Seen and examined at his bedside this morning.  Denies any neurological symptoms.  Elevated serum calcium, 11.4.  Currently on normal saline at 100 cc/h.  Seen by nephrology initiated hypercalcemia work-up, work-up will be completed outpatient.  Also seen by podiatry, ideally no weightbearing on right foot.  Likely will discharge tomorrow on oral antibiotics.   Assessment/Plan: Principal Problem:   Osteomyelitis of great toe of right foot (HCC) Active Problems:   Type 2 diabetes mellitus with complication, without long-term current use of insulin (HCC)   HTN (hypertension)   Hypercalcemia  Right great toe osteomyelitis, seen on MRI, POA with prior history of pseudomonas A. And enterobacter cloacae wound infection on 06/14/19. Podiatry following, Post right great toe  amputation 07/11/2020 by Dr. Vickki Muff. Continue to follow operative tissue cultures Received empiric IV antibiotics, IV vancomycin and Rocephin Switched to po cipro and po doxycycline in anticipation for discharge based on prior deep wound cultures. ID is following, deferring final choice of oral antibiotics to ID ESR 35, CRP 1.2 on 07/11/20.  Hypercalcemia unclear etiology. No neurologic symptoms, no GI symptoms, no kidney stones, or renal impairment. Calcium 11.4 on 07/12/2020. PTH is elevated at 80 from 07/10/2020 Continue IV fluids normal saline at 100 cc/h Seen by nephrology, initiated hypercalcemia work-up, will complete work-up outpatient.  Hypovolemic hyponatremia Serum sodium, downtrending 132 from 133 Continue normal saline at 100 cc/h Continue to monitor  Type 2 diabetes with hyperglycemia Hemoglobin A1c, 6.7 on 07/09/2020 Cover with insulin sliding scale Avoid blood glucose above 180 in the setting of wound care with planned surgery.  Essential hypertension Continue home Norvasc and losartan Continue to monitor vital signs.  Hyperlipidemia Continue home Lipitor.    Code Status: Full code  Family Communication: None at bedside  Disposition Plan: To home likely on 07/13/2020.   Consultants:  Podiatry  Nephrology  Procedures:  Surgical intervention planned on 07/11/2020 afternoon  Antimicrobials:  IV vancomycin  Rocephin  DVT prophylaxis: SCD  Status is: Inpatient    Dispo:  Patient From: Home  Planned Disposition: Home  Expected discharge date: 07/13/20  Medically stable for discharge: No, ongoing management post amputation of right great toe, and management of hypercalcemia.       Objective: Vitals:   07/11/20 2135 07/11/20 2311 07/12/20 0344 07/12/20 0723  BP: (!) 147/98 135/89 (!) 152/96 (!) 151/91  Pulse: 89 96 79 81  Resp: '18 18 18 ' 18  Temp: 98 F (36.7 C) 98.7 F (37.1 C) 98 F (36.7 C) 98.4 F (36.9 C)  TempSrc:    Oral Oral  SpO2: 98% 93% 96% 99%  Weight:      Height:        Intake/Output Summary (Last 24 hours) at 07/12/2020 1327 Last data filed at 07/12/2020 1016 Gross per 24 hour  Intake 880 ml  Output 1405 ml  Net -525 ml   Filed Weights   07/09/20 1248  Weight: 111.1 kg    Exam:  . General: 54 y.o. year-old male well-developed well-nourished no acute distress.  Alert and oriented x3. .   Cardiovascular: Regular rate and rhythm no rubs or gallops. Marland Kitchen Respiratory: Clear to auscultation no wheezes or rales.   . Abdomen: Soft nontender normal bowel sounds present.   . Musculoskeletal: No lower extremity edema bilaterally.  Right foot is wrapped in surgical dressing. Marland Kitchen Psychiatry: Mood is appropriate for condition and setting.   Data Reviewed: CBC: Recent Labs  Lab 07/09/20 1258 07/10/20 0534 07/12/20 0430  WBC 6.1 5.6 9.8  NEUTROABS 4.4  --   --   HGB 14.8 14.7 15.3  HCT 43.1 43.6 44.3  MCV 88.1 86.9 87.0  PLT 257 226 314   Basic Metabolic Panel: Recent Labs  Lab 07/09/20 1258 07/10/20 0534 07/11/20 0524 07/12/20 0420 07/12/20 0655  NA 137 134* 133* 132*  --   K 4.0 4.1 5.0 4.5  --   CL 103 101 100 100  --   CO2 '25 25 27 23  ' --   GLUCOSE 182* 141* 231* 309*  --   BUN '10 9 10 13  ' --   CREATININE 0.73 0.71 0.84 0.72  --   CALCIUM 11.1* 11.4* 11.8* 11.4*  --   PHOS  --   --   --   --  1.9*   GFR: Estimated Creatinine Clearance: 142 mL/min (by C-G formula based on SCr of 0.72 mg/dL). Liver Function Tests: Recent Labs  Lab 07/09/20 1258  AST 23  ALT 23  ALKPHOS 96  BILITOT 1.1  PROT 7.5  ALBUMIN 3.8   No results for input(s): LIPASE, AMYLASE in the last 168 hours. No results for input(s): AMMONIA in the last 168 hours. Coagulation Profile: No results for input(s): INR, PROTIME in the last 168 hours. Cardiac Enzymes: No results for input(s): CKTOTAL, CKMB, CKMBINDEX, TROPONINI in the last 168 hours. BNP (last 3 results) No results for input(s): PROBNP  in the last 8760 hours. HbA1C: No results for input(s): HGBA1C in the last 72 hours. CBG: Recent Labs  Lab 07/11/20 1613 07/11/20 1659 07/11/20 2117 07/12/20 0727 07/12/20 1144  GLUCAP 156* 156* 283* 267* 193*   Lipid Profile: No results for input(s): CHOL, HDL, LDLCALC, TRIG, CHOLHDL, LDLDIRECT in the last 72 hours. Thyroid Function Tests: No results for input(s): TSH, T4TOTAL, FREET4, T3FREE, THYROIDAB in the last 72 hours. Anemia Panel: No results for input(s): VITAMINB12, FOLATE, FERRITIN, TIBC, IRON, RETICCTPCT in the last 72 hours. Urine analysis:    Component Value Date/Time   COLORURINE YELLOW (A) 07/09/2020 2155   APPEARANCEUR CLEAR (A) 07/09/2020 2155   LABSPEC 1.017 07/09/2020 2155   PHURINE 6.0 07/09/2020 2155   GLUCOSEU 150 (A) 07/09/2020 2155   HGBUR SMALL (A) 07/09/2020 2155   BILIRUBINUR NEGATIVE 07/09/2020 2155   KETONESUR NEGATIVE 07/09/2020 2155   PROTEINUR NEGATIVE 07/09/2020 2155   NITRITE NEGATIVE 07/09/2020 2155   LEUKOCYTESUR NEGATIVE 07/09/2020 2155   Sepsis Labs: '@LABRCNTIP' (procalcitonin:4,lacticidven:4)  )  Recent Results (from the past 240 hour(s))  Respiratory Panel by RT PCR (Flu A&B, Covid) - Nasopharyngeal Swab     Status: None   Collection Time: 07/09/20  9:28 PM   Specimen: Nasopharyngeal Swab  Result Value Ref Range Status   SARS Coronavirus 2 by RT PCR NEGATIVE NEGATIVE Final    Comment: (NOTE) SARS-CoV-2 target nucleic acids are NOT DETECTED.  The SARS-CoV-2 RNA is generally detectable in upper respiratoy specimens during the acute phase of infection. The lowest concentration of SARS-CoV-2 viral copies this assay can detect is 131 copies/mL. A negative result does not preclude SARS-Cov-2 infection and should not be used as the Russell basis for treatment or other patient management decisions. A negative result may occur with  improper specimen collection/handling, submission of specimen other than nasopharyngeal swab, presence of  viral mutation(s) within the areas targeted by this assay, and inadequate number of viral copies (<131 copies/mL). A negative result must be combined with clinical observations, patient history, and epidemiological information. The expected result is Negative.  Fact Sheet for Patients:  PinkCheek.be  Fact Sheet for Healthcare Providers:  GravelBags.it  This test is no t yet approved or cleared by the [place] FDA and  has been authorized for detection and/or diagnosis of SARS-CoV-2 by FDA under an Emergency Use Authorization (EUA). This EUA will remain  in effect (meaning this test can be used) for the duration of the COVID-19 declaration under Section 564(b)(1) of the Act, 21 U.S.C. section 360bbb-3(b)(1), unless the authorization is terminated or revoked sooner.     Influenza A by PCR NEGATIVE NEGATIVE Final   Influenza B by PCR NEGATIVE NEGATIVE Final    Comment: (NOTE) The Xpert Xpress SARS-CoV-2/FLU/RSV assay is intended as an aid in  the diagnosis of influenza from Nasopharyngeal swab specimens and  should not be used as a Russell basis for treatment. Nasal washings and  aspirates are unacceptable for Xpert Xpress SARS-CoV-2/FLU/RSV  testing.  Fact Sheet for Patients: PinkCheek.be  Fact Sheet for Healthcare Providers: GravelBags.it  This test is not yet approved or cleared by the [place] FDA and  has been authorized for detection and/or diagnosis of SARS-CoV-2 by  FDA under an Emergency Use Authorization (EUA). This EUA will remain  in effect (meaning this test can be used) for the duration of the  Covid-19 declaration under Section 564(b)(1) of the Act, 21  U.S.C. section 360bbb-3(b)(1), unless the authorization is  terminated or revoked. Performed at [facility], [address], [place], [place] [address]   Surgical pcr screen      Status: None   Collection Time: 07/10/20  4:20 PM   Specimen: Nasal Mucosa; Nasal Swab  Result Value Ref Range Status   MRSA, PCR NEGATIVE NEGATIVE Final   Staphylococcus aureus NEGATIVE NEGATIVE Final    Comment: (NOTE) The Xpert SA Assay (FDA approved for NASAL specimens in patients 17 years of age and older), is one component of a comprehensive surveillance program. It is not intended to diagnose infection nor to guide or monitor treatment. Performed at [facility], [address] [place],  [place] [address]   Aerobic/Anaerobic Culture (surgical/deep wound)     Status: None (Preliminary result)   Collection Time: 07/11/20  3:43 PM   Specimen: PATH Digit amputation; Tissue  Result Value Ref Range Status   Specimen Description   Final    WOUND Performed at [facility], [address] [place], [place] [address]    Special Requests  Final    DIGIT AMPUTATION Performed at [facility], [address] [place], [place] [address]    Gram Stain   Final    FEW WBC PRESENT,BOTH PMN AND MONONUCLEAR NO ORGANISMS SEEN    Culture   Final    CULTURE REINCUBATED FOR BETTER GROWTH Performed at [facility], [place] [address], [place], [place] [address]    Report Status PENDING  Incomplete      Studies: DG MINI C-ARM IMAGE ONLY  Result Date: 07/11/2020 There is no interpretation for this exam.  This order is for images obtained during a surgical procedure.  Please See "Surgeries" Tab for more information regarding the procedure.    Scheduled Meds: . amLODipine  10 mg Oral Daily  . atorvastatin  40 mg Oral q1800  . Chlorhexidine Gluconate Cloth  6 each Topical Daily  . ciprofloxacin  500 mg Oral BID  . doxycycline  100 mg Oral Q12H  . enoxaparin (LOVENOX) injection  0.5 mg/kg Subcutaneous Q24H  . influenza vac split quadrivalent PF  0.5 mL Intramuscular Tomorrow-1000  . insulin aspart  0-5 Units Subcutaneous QHS  . insulin aspart  0-9 Units  Subcutaneous TID WC  . losartan  50 mg Oral Daily  . potassium & sodium phosphates  1 packet Oral BID WC    Continuous Infusions: . sodium chloride 400 mL/hr at 07/11/20 1558     LOS: 3 days     Kayleen Memos, MD [facility] Pager [phone]  If 7PM-7AM, please contact night-coverage [email or web address] Password [email or web address] 07/12/2020, 1:27 PM

## 2020-07-12 NOTE — Progress Notes (Signed)
ID S/p rt great toe amputation Doing well No pain No fever  Patient Vitals for the past 24 hrs:  BP Temp Temp src Pulse Resp SpO2  07/12/20 1639 (!) 143/92 98.7 F (37.1 C) Oral 78 18 99 %  07/12/20 0723 (!) 151/91 98.4 F (36.9 C) Oral 81 18 99 %  07/12/20 0344 (!) 152/96 98 F (36.7 C) Oral 79 18 96 %  07/11/20 2311 135/89 98.7 F (37.1 C) -- 96 18 93 %    Awake and alert Rt foot dressing not removed  Labs CBC Latest Ref Rng & Units 07/12/2020 07/10/2020 07/09/2020  WBC 4.0 - 10.5 K/uL 9.8 5.6 6.1  Hemoglobin 13.0 - 17.0 g/dL 15.3 14.7 14.8  Hematocrit 39 - 52 % 44.3 43.6 43.1  Platelets 150 - 400 K/uL 261 226 257    CMP Latest Ref Rng & Units 07/12/2020 07/11/2020 07/10/2020  Glucose 70 - 99 mg/dL 309(H) 231(H) 141(H)  BUN 6 - 20 mg/dL 13 10 9   Creatinine 0.61 - 1.24 mg/dL 0.72 0.84 0.71  Sodium 135 - 145 mmol/L 132(L) 133(L) 134(L)  Potassium 3.5 - 5.1 mmol/L 4.5 5.0 4.1  Chloride 98 - 111 mmol/L 100 100 101  CO2 22 - 32 mmol/L 23 27 25   Calcium 8.9 - 10.3 mg/dL 11.4(H) 11.8(H) 11.4(H)  Total Protein 6.5 - 8.1 g/dL - - -  Total Bilirubin 0.3 - 1.2 mg/dL - - -  Alkaline Phos 38 - 126 U/L - - -  AST 15 - 41 U/L - - -  ALT 0 - 44 U/L - - -    Micro Culture pending   Impression/Recommendation ? ?Rt great toe infection osteomyelitis with DM and neuropathy.s/p amputation  On vanco and unasyn As per Dr.fowler he can be discharged on PO antibiotic Instead of doxy and cipro would do augmentin and bactrim if cultures are not available tomorrow- Will follow him as Op and adjust antibiotics . H/o 5 th toe ray excision because of infection in feb 2020  DM on insulin  HTn on amlodipine, losartan Hyperlipidemia- on atorvastatin  Discussed  the management with the patient and care team  in detail  Dr.Fitzgerald is covering ID tomorrow- Call him if needed

## 2020-07-13 LAB — GLUCOSE, CAPILLARY
Glucose-Capillary: 242 mg/dL — ABNORMAL HIGH (ref 70–99)
Glucose-Capillary: 234 mg/dL — ABNORMAL HIGH (ref 70–99)

## 2020-07-13 LAB — BASIC METABOLIC PANEL
Anion gap: 6 (ref 5–15)
BUN: 13 mg/dL (ref 6–20)
CO2: 28 mmol/L (ref 22–32)
Calcium: 11.1 mg/dL — ABNORMAL HIGH (ref 8.9–10.3)
Chloride: 100 mmol/L (ref 98–111)
Creatinine, Ser: 0.85 mg/dL (ref 0.61–1.24)
GFR, Estimated: >60 mL/min (ref >60)
Glucose, Bld: 281 mg/dL — ABNORMAL HIGH (ref 70–99)
Potassium: 4.9 mmol/L (ref 3.5–5.1)
Sodium: 134 mmol/L — ABNORMAL LOW (ref 135–145)

## 2020-07-13 LAB — CBC WITH DIFFERENTIAL/PLATELET
Abs Immature Granulocytes: 0.03 10*3/uL (ref 0.00–0.07)
Basophils Absolute: 0 10*3/uL (ref 0.0–0.1)
Basophils Relative: 1 %
Eosinophils Absolute: 0.1 10*3/uL (ref 0.0–0.5)
Eosinophils Relative: 2 %
HCT: 40.9 % (ref 39.0–52.0)
Hemoglobin: 13.6 g/dL (ref 13.0–17.0)
Immature Granulocytes: 1 %
Lymphocytes Relative: 29 %
Lymphs Abs: 1.7 10*3/uL (ref 0.7–4.0)
MCH: 29.5 pg (ref 26.0–34.0)
MCHC: 33.3 g/dL (ref 30.0–36.0)
MCV: 88.7 fL (ref 80.0–100.0)
Monocytes Absolute: 0.6 10*3/uL (ref 0.1–1.0)
Monocytes Relative: 9 %
Neutro Abs: 3.5 10*3/uL (ref 1.7–7.7)
Neutrophils Relative %: 58 %
Platelets: 202 10*3/uL (ref 150–400)
RBC: 4.61 MIL/uL (ref 4.22–5.81)
RDW: 11.7 % (ref 11.5–15.5)
WBC: 5.9 10*3/uL (ref 4.0–10.5)
nRBC: 0 % (ref 0.0–0.2)

## 2020-07-13 LAB — C-REACTIVE PROTEIN: CRP: 0.6 mg/dL (ref <1.0)

## 2020-07-13 LAB — KAPPA/LAMBDA LIGHT CHAINS
Kappa free light chain: 26.3 mg/L — ABNORMAL HIGH (ref 3.3–19.4)
Kappa, lambda light chain ratio: 1.11 (ref 0.26–1.65)
Lambda free light chains: 23.6 mg/L (ref 5.7–26.3)

## 2020-07-13 LAB — THYROID PANEL WITH TSH
Free Thyroxine Index: 2.1 (ref 1.2–4.9)
T3 Uptake Ratio: 35 % (ref 24–39)
T4, Total: 5.9 ug/dL (ref 4.5–12.0)
TSH: 0.63 u[IU]/mL (ref 0.450–4.500)

## 2020-07-13 LAB — SEDIMENTATION RATE: Sed Rate: 34 mm/hr — ABNORMAL HIGH (ref 0–20)

## 2020-07-13 MED ORDER — VANCOMYCIN HCL IN DEXTROSE 1-5 GM/200ML-% IV SOLN
1000.0000 mg | Freq: Two times a day (BID) | INTRAVENOUS | Status: DC
  Filled 2020-07-13: qty 200

## 2020-07-13 MED ORDER — GLIPIZIDE XL 10 MG PO TB24
20.0000 mg | ORAL_TABLET | Freq: Every day | ORAL | 0 refills | Status: DC
Start: 2020-07-13 — End: 2021-05-09

## 2020-07-13 MED ORDER — AMOXICILLIN-POT CLAVULANATE 875-125 MG PO TABS: 1.0000 | ORAL_TABLET | Freq: Two times a day (BID) | ORAL | 0 refills | Status: AC

## 2020-07-13 MED ORDER — ATORVASTATIN CALCIUM 40 MG PO TABS: 40.0000 mg | ORAL_TABLET | Freq: Every day | ORAL | 0 refills | Status: DC

## 2020-07-13 MED ORDER — AMLODIPINE BESYLATE 10 MG PO TABS: 10.0000 mg | ORAL_TABLET | Freq: Every day | ORAL | 0 refills | Status: DC

## 2020-07-13 MED ORDER — ACETAMINOPHEN 325 MG PO TABS
650.0000 mg | ORAL_TABLET | Freq: Four times a day (QID) | ORAL | Status: DC | PRN
  Administered 2020-07-13: 650 mg via ORAL
  Filled 2020-07-13: qty 2

## 2020-07-13 MED ORDER — SULFAMETHOXAZOLE-TRIMETHOPRIM 800-160 MG PO TABS: 1.0000 | ORAL_TABLET | Freq: Two times a day (BID) | ORAL | 0 refills | Status: DC

## 2020-07-13 MED ORDER — AMOXICILLIN-POT CLAVULANATE 875-125 MG PO TABS: 1.0000 | ORAL_TABLET | Freq: Two times a day (BID) | ORAL | 0 refills | Status: DC

## 2020-07-13 MED ORDER — LOSARTAN POTASSIUM 50 MG PO TABS: 50.0000 mg | ORAL_TABLET | Freq: Every day | ORAL | 0 refills | Status: DC

## 2020-07-13 MED ORDER — SULFAMETHOXAZOLE-TRIMETHOPRIM 800-160 MG PO TABS: 1.0000 | ORAL_TABLET | Freq: Two times a day (BID) | ORAL | 0 refills | Status: AC

## 2020-07-13 MED ORDER — SULFAMETHOXAZOLE-TRIMETHOPRIM 800-160 MG PO TABS
1.0000 | ORAL_TABLET | Freq: Two times a day (BID) | ORAL | Status: DC
  Administered 2020-07-13: 1 via ORAL
  Filled 2020-07-13: qty 1

## 2020-07-13 MED ORDER — AMOXICILLIN-POT CLAVULANATE 875-125 MG PO TABS
1.0000 | ORAL_TABLET | Freq: Two times a day (BID) | ORAL | Status: DC
  Administered 2020-07-13: 1 via ORAL
  Filled 2020-07-13: qty 1

## 2020-07-13 NOTE — TOC Initial Note (Signed)
Transition of Care [facility]) - Initial/Assessment Note    Patient Details  Name: [name] MRN: [id number] Date of Birth: January 18, 1966  Transition of Care [facility]) CM/SW Contact:    Shelbie Ammons, RN Phone Number: 07/13/2020, 8:18 AM  Clinical Narrative:   RNCM met with patient at bedside to discuss discharge planning. Patient is sitting up in bed, happy that the plan is for him to go home tomorrow. Patient reports that he has already obtained the recommended equipment and it will be delivered over the weekend. He reports that at this time it is his plan to return to work on Monday and does not feel he will need any therapy. Discussed with patient that if he changes his mind he can always reach out to his MD and they can arrange.             Expected Discharge Plan: Home/Self Care Barriers to Discharge: No Barriers Identified   Patient Goals and CMS Choice Patient states their goals for this hospitalization and ongoing recovery are:: I think I will be fine I was moving around good yesterday.      Expected Discharge Plan and Services Expected Discharge Plan: Home/Self Care       Living arrangements for the past 2 months: Apartment Expected Discharge Date: 07/13/20                                    Prior Living Arrangements/Services Living arrangements for the past 2 months: Apartment Lives with:: Self Patient language and need for interpreter reviewed:: Yes Do you feel safe going back to the place where you live?: Yes      Need for Family Participation in Patient Care: No (Comment) Care giver support system in place?: Yes (comment)   Criminal Activity/Legal Involvement Pertinent to Current Situation/Hospitalization: No - Comment as needed  Activities of Daily Living Home Assistive Devices/Equipment: None ADL Screening (condition at time of admission) Patient's cognitive ability adequate to safely complete daily activities?: Yes Is the patient deaf or have difficulty  hearing?: No Does the patient have difficulty seeing, even when wearing glasses/contacts?: No Does the patient have difficulty concentrating, remembering, or making decisions?: No Patient able to express need for assistance with ADLs?: Yes Does the patient have difficulty dressing or bathing?: No Independently performs ADLs?: Yes (appropriate for developmental age) Does the patient have difficulty walking or climbing stairs?: No Weakness of Legs: None Weakness of Arms/Hands: None  Permission Sought/Granted                  Emotional Assessment Appearance:: Appears stated age Attitude/Demeanor/Rapport: Engaged Affect (typically observed): Appropriate Orientation: : Oriented to Self, Oriented to Place, Oriented to  Time, Oriented to Situation Alcohol / Substance Use: Not Applicable Psych Involvement: No (comment)  Admission diagnosis:  Osteomyelitis ([place]) [M86.9] Osteomyelitis of right foot, unspecified type [facility]) [M86.9] Patient Active Problem List   Diagnosis Date Noted  . Hypercalcemia 07/10/2020  . Type 2 diabetes mellitus with complication, without long-term current use of insulin ([place]) 07/09/2020  . HTN (hypertension) 07/09/2020  . Osteomyelitis of great toe of right foot ([place]) 10/30/2018  . Dog bite 04/13/2018   PCP:  Valera Castle, MD Pharmacy:   [place], [place] - [place] [place] [address] [place] [address] Phone: [phone] Fax: [phone]     Social Determinants of Health (SDOH) Interventions  Readmission Risk Interventions No flowsheet data found.

## 2020-07-13 NOTE — Progress Notes (Signed)
DISCHARGE NOTE:  Pt given discharge instructions and scripts. Pts surgical dressing clean dry and intact.Post op shoe on. Pt verbalized understanding. Pt wheeled to car by staff. Girlfriend providing transportation.

## 2020-07-13 NOTE — Discharge Instructions (Signed)
Osteomyelitis, Adult  Bone infections (osteomyelitis) occur when bacteria or other germs get inside a bone. This can happen if you have an infection in another part of your body that spreads through your blood. Germs from your skin or from outside of your body can also cause this type of infection if you have a wound or a broken bone (fracture) that breaks the skin. Bone infections need to be treated quickly to prevent bone damage and to prevent the infection from spreading to other areas of your body. What are the causes? Most bone infections are caused by bacteria. They can also be caused by other germs, such as viruses and funguses. What increases the risk? You are more likely to develop this condition if you:  Recently had surgery, especially bone or joint surgery.  Have a long-term (chronic) disease, such as: ? Diabetes. ? HIV (human immunodeficiency virus). ? Rheumatoid arthritis. ? Sickle cell anemia. ? Kidney disease that requires dialysis.  Are aged 56 years or older.  Have a condition or take medicines that block or weaken your body's defense system (immune system).  Have a condition that reduces your blood flow.  Have an artificial joint.  Have had a joint or bone repaired with plates or screws (surgical hardware).  Use IV drugs.  Have a central line for IV access.  Have had trauma, such as stepping on a nail or a broken bone that came through the skin. What are the signs or symptoms? Symptoms vary depending on the type and location of your infection. Common symptoms of bone infections include:  Fever and chills.  Skin redness and warmth.  Swelling.  Pain and stiffness.  Drainage of fluid or pus near the infection. How is this diagnosed? This condition may be diagnosed based on:  Your symptoms and medical history.  A physical exam.  Tests, such as: ? A sample of tissue, fluid, or blood taken to be examined under a microscope. ? Pus or discharge swabbed  from a wound for testing to identify germs and to determine what type of medicine will kill them (culture and sensitivity). ? Blood tests.  Imaging studies. These may include: ? X-rays. ? MRI. ? CT scan. ? Bone scan. ? Ultrasound. How is this treated? Treatment for this condition depends on the cause and type of infection. Antibiotic medicines are usually the first treatment for a bone infection. This may be done in a hospital at first. You may have to continue IV antibiotics at home or take antibiotics by mouth for several weeks after that. Other treatments may include surgery to remove:  Dead or dying tissue from a bone.  An infected artificial joint.  Infected plates or screws that were used to repair a broken bone. Follow these instructions at home: Medicines   Take over-the-counter and prescription medicines only as told by your health care provider.  Take your antibiotic medicine as told by your health care provider. Do not stop taking the antibiotic even if you start to feel better.  Follow instructions from your health care provider about how to take IV antibiotics at home. You may need to have a nurse come to your home to give you the IV antibiotics. General instructions   Ask your health care provider if you have any restrictions on your activities.  If directed, put ice on the affected area: ? Put ice in a plastic bag. ? Place a towel between your skin and the bag. ? Leave the ice on for 20  minutes, 2-3 times a day.  Wash your hands often with soap and water. If soap and water are not available, use hand sanitizer.  Do not use any products that contain nicotine or tobacco, such as cigarettes and e-cigarettes. These can delay bone healing. If you need help quitting, ask your health care provider.  Keep all follow-up visits as told by your health care provider. This is important. Contact a health care provider if:  You develop a fever or chills.  You have  redness, warmth, pain, or swelling that returns after treatment. Get help right away if:  You have rapid breathing or you have trouble breathing.  You have chest pain.  You cannot drink fluids or make urine.  The affected area swells, changes color, or turns blue.  You have numbness or severe pain in the affected area. Summary  Bone infections (osteomyelitis) occur when bacteria or other germs get inside a bone.  You may be more likely to get this type of infection if you have a condition, such as diabetes, that lowers your ability to fight infection or increases your chances of getting an infection.  Most bone infections are caused by bacteria. They can also be caused by other germs, such as viruses and funguses.  Treatment for this condition usually starts with taking antibiotics. Further treatment depends on the cause and type of infection. This information is not intended to replace advice given to you by your health care provider. Make sure you discuss any questions you have with your health care provider. Document Revised: 10/01/2017 Document Reviewed: 09/24/2017 Elsevier Patient Education  2020 [employer].  Hypercalcemia Hypercalcemia is when the level of calcium in a person's blood is above normal. The body needs calcium to make bones and keep them strong. Calcium also helps the muscles, nerves, brain, and heart work the way they should. Most of the calcium in the body is in the bones. There is also some calcium in the blood. Hypercalcemia can happen when calcium comes out of the bones, or when the kidneys are not able to remove calcium from the blood. Hypercalcemia can be mild or severe. What are the causes? There are many possible causes of hypercalcemia. Common causes of this condition include:  Hyperparathyroidism. This is a condition in which the body produces too much parathyroid hormone. There are four parathyroid glands in your neck. These glands produce a chemical  messenger (hormone) that helps the body absorb calcium from foods and helps your bones release calcium.  Certain kinds of cancer. Less common causes of hypercalcemia include:  Getting too much calcium or vitamin D from your diet.  Kidney failure.  Hyperthyroidism.  Severe dehydration.  Being on bed rest or being inactive for a long time.  Certain medicines.  Infections. What increases the risk? You are more likely to develop this condition if you:  Are male.  Are 54 years of age or older.  Have a family history of hypercalcemia. What are the signs or symptoms? Mild hypercalcemia that starts slowly may not cause symptoms. Severe, sudden hypercalcemia is more likely to cause symptoms, such as:  Being more thirsty than usual.  Needing to urinate more often than usual.  Abdominal pain.  Nausea and vomiting.  Constipation.  Muscle pain, twitching, or weakness.  Feeling very tired. How is this diagnosed?  Hypercalcemia is usually diagnosed with a blood test. You may also have tests to help determine what is causing this condition, such as imaging tests and more blood  tests. How is this treated? Treatment for hypercalcemia depends on the cause. Treatment may include:  Receiving fluids through an IV.  Medicines that: ? Keep calcium levels steady after receiving fluids (loop diuretics). ? Keep calcium in your bones (bisphosphonates). ? Lower the calcium level in your blood.  Surgery to remove overactive parathyroid glands.  A procedure that filters your blood to correct calcium levels (hemodialysis). Follow these instructions at home:   Take over-the-counter and prescription medicines only as told by your health care provider.  Follow instructions from your health care provider about eating or drinking restrictions.  Drink enough fluid to keep your urine pale yellow.  Stay active. Weight-bearing exercise helps to keep calcium in your bones. Follow  instructions from your health care provider about what type and level of exercise is safe for you.  Keep all follow-up visits as told by your health care provider. This is important. Contact a health care provider if you have:  A fever.  A heartbeat that is irregular or very fast.  Changes in mood, memory, or personality. Get help right away if you:  Have severe abdominal pain.  Have chest pain.  Have trouble breathing.  Become very confused and sleepy.  Lose consciousness. Summary  Hypercalcemia is when the level of calcium in a person's blood is above normal. The body needs calcium to make bones and keep them strong. Calcium also helps the muscles, nerves, brain, and heart work the way they should.  There are many possible causes of hypercalcemia, and treatment depends on the cause.  Take over-the-counter and prescription medicines only as told by your health care provider.  Follow instructions from your health care provider about eating or drinking restrictions. This information is not intended to replace advice given to you by your health care provider. Make sure you discuss any questions you have with your health care provider. Document Revised: 10/12/2018 Document Reviewed: 06/21/2018 Elsevier Patient Education  2020 [employer].

## 2020-07-13 NOTE — Consult Note (Signed)
Pharmacy Antibiotic Note  [name] is a 54 y.o. 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  Pharmacy has been consulted for vancomycin and Unasyn dosing. Since admission renal function has been stable and at apparent baseline. Vancomycin was stopped this morning and now being restarted.  Plan:   1) Continue Unasyn 3 grams IV every 6 hours  2) Adjust vancomycin to  vancomycin 1000 mg IV Q 12 hrs (resume previous dose s/p amputation)    Monitor renal function  vancomycin levels as clinically indicated 3) Awaiting culture data (per 10/14 ID note,  If discharged prior to final culture, recommendation is TMP/SMZ plus amoxicillin/clavulonate  Height: 6\' 3"  (190.5 cm) Weight: 111.1 kg (245 lb) IBW/kg (Calculated) : 84.5  Temp (24hrs), Avg:98.5 F (36.9 C), Min:98.1 F (36.7 C), Max:98.8 F (37.1 C)  Recent Labs  Lab 07/09/20 1258 07/10/20 0534 07/11/20 0524 07/12/20 0420 07/12/20 0430 07/13/20 0417  WBC 6.1 5.6  --   --  9.8 5.9  CREATININE 0.73 0.71 0.84 0.72  --  0.85  LATICACIDVEN 1.1  --   --   --   --   --     Estimated Creatinine Clearance: 133.6 mL/min (by C-G formula based on SCr of 0.85 mg/dL).    No Known Allergies  Antimicrobials this admission: ceftriaxone 10/12 >> 10/13 vancomycin 10/11 >>  Unasyn 10/14 >>  Microbiology results: 10/13 WCx: pending 10/11 SARS CoV-2: negative  10/11 influenza A/B: negative 10/12 MRSA PCR: negative  Thank you for allowing pharmacy to be a part of this patient's care.  Doreene Eland, PharmD, BCPS.   Work Cell: [phone] 07/13/2020 9:43 AM

## 2020-07-13 NOTE — Progress Notes (Signed)
[facility]  ROUNDING NOTE   Subjective:  Patient came in with Rt.great toe osteomyelitis,required amputation, which was done on 07/11/20.He is recovering well from surgery.Nephrology was consulted for hypercalcemia. Patient sitting up in bed, in no acute distress.     Objective:  Vital signs in last 24 hours:  Temp:  [98.1 F (36.7 C)-98.8 F (37.1 C)] 98.1 F (36.7 C) (10/15 0806) Pulse Rate:  [70-78] 71 (10/15 0806) Resp:  [18] 18 (10/15 0806) BP: (143-150)/(89-92) 150/89 (10/15 0806) SpO2:  [98 %-100 %] 100 % (10/15 0806)  Weight change:  Filed Weights   07/09/20 1248  Weight: 111.1 kg    Intake/Output: I/O last 3 completed shifts: In: 17 [P.O.:960] Out: 3100 [Urine:2400; Other:700]   Intake/Output this shift:  Total I/O In: -  Out: 550 [Urine:550]  Physical Exam: General: Sitting up in bed,in no acute distress  Head: Normocephalic, atraumatic. Moist oral mucosal membranes  Eyes: Anicteric  Neck: Supple, trachea at the midline  Lungs:  Clear to auscultation bilaterally  Heart: S1S2, Regular rate and rhythm  Abdomen:  Soft, nontender,   Extremities:  No  peripheral edema.  Neurologic: Oriented x 3,  moving all four extremities  Skin: Rt foot with dressing intact    Basic Metabolic Panel: Recent Labs  Lab 07/09/20 1258 07/09/20 1258 07/10/20 0534 07/10/20 0534 07/11/20 0524 07/12/20 0420 07/12/20 0655 07/13/20 0417  NA 137  --  134*  --  133* 132*  --  134*  K 4.0  --  4.1  --  5.0 4.5  --  4.9  CL 103  --  101  --  100 100  --  100  CO2 25  --  25  --  27 23  --  28  GLUCOSE 182*  --  141*  --  231* 309*  --  281*  BUN 10  --  9  --  10 13  --  13  CREATININE 0.73  --  0.71  --  0.84 0.72  --  0.85  CALCIUM 11.1*   < > 11.4*   < > 11.8* 11.4*  --  11.1*  PHOS  --   --   --   --   --   --  1.9*  --    < > = values in this interval not displayed.    Liver Function Tests: Recent Labs  Lab 07/09/20 1258  AST 23  ALT 23   ALKPHOS 96  BILITOT 1.1  PROT 7.5  ALBUMIN 3.8   No results for input(s): LIPASE, AMYLASE in the last 168 hours. No results for input(s): AMMONIA in the last 168 hours.  CBC: Recent Labs  Lab 07/09/20 1258 07/10/20 0534 07/12/20 0430 07/13/20 0417  WBC 6.1 5.6 9.8 5.9  NEUTROABS 4.4  --   --  3.5  HGB 14.8 14.7 15.3 13.6  HCT 43.1 43.6 44.3 40.9  MCV 88.1 86.9 87.0 88.7  PLT 257 226 261 202    Cardiac Enzymes: No results for input(s): CKTOTAL, CKMB, CKMBINDEX, TROPONINI in the last 168 hours.  BNP: Invalid input(s): POCBNP  CBG: Recent Labs  Lab 07/12/20 0727 07/12/20 1144 07/12/20 1640 07/12/20 2112 07/13/20 0804  GLUCAP 267* 193* 243* 194* 242*    Microbiology: Results for orders placed or performed during the hospital encounter of 07/09/20  Respiratory Panel by RT PCR (Flu A&B, Covid) - Nasopharyngeal Swab     Status: None   Collection Time: 07/09/20  9:28 PM  Specimen: Nasopharyngeal Swab  Result Value Ref Range Status   SARS Coronavirus 2 by RT PCR NEGATIVE NEGATIVE Final    Comment: (NOTE) SARS-CoV-2 target nucleic acids are NOT DETECTED.  The SARS-CoV-2 RNA is generally detectable in upper respiratoy specimens during the acute phase of infection. The lowest concentration of SARS-CoV-2 viral copies this assay can detect is 131 copies/mL. A negative result does not preclude SARS-Cov-2 infection and should not be used as the sole basis for treatment or other patient management decisions. A negative result may occur with  improper specimen collection/handling, submission of specimen other than nasopharyngeal swab, presence of viral mutation(s) within the areas targeted by this assay, and inadequate number of viral copies (<131 copies/mL). A negative result must be combined with clinical observations, patient history, and epidemiological information. The expected result is Negative.  Fact Sheet for Patients:   PinkCheek.be  Fact Sheet for Healthcare Providers:  GravelBags.it  This test is no t yet approved or cleared by the [place] FDA and  has been authorized for detection and/or diagnosis of SARS-CoV-2 by FDA under an Emergency Use Authorization (EUA). This EUA will remain  in effect (meaning this test can be used) for the duration of the COVID-19 declaration under Section 564(b)(1) of the Act, 21 U.S.C. section 360bbb-3(b)(1), unless the authorization is terminated or revoked sooner.     Influenza A by PCR NEGATIVE NEGATIVE Final   Influenza B by PCR NEGATIVE NEGATIVE Final    Comment: (NOTE) The Xpert Xpress SARS-CoV-2/FLU/RSV assay is intended as an aid in  the diagnosis of influenza from Nasopharyngeal swab specimens and  should not be used as a sole basis for treatment. Nasal washings and  aspirates are unacceptable for Xpert Xpress SARS-CoV-2/FLU/RSV  testing.  Fact Sheet for Patients: PinkCheek.be  Fact Sheet for Healthcare Providers: GravelBags.it  This test is not yet approved or cleared by the [place] FDA and  has been authorized for detection and/or diagnosis of SARS-CoV-2 by  FDA under an Emergency Use Authorization (EUA). This EUA will remain  in effect (meaning this test can be used) for the duration of the  Covid-19 declaration under Section 564(b)(1) of the Act, 21  U.S.C. section 360bbb-3(b)(1), unless the authorization is  terminated or revoked. Performed at [facility], [address] [place], [place] [address]   Surgical pcr screen     Status: None   Collection Time: 07/10/20  4:20 PM   Specimen: Nasal Mucosa; Nasal Swab  Result Value Ref Range Status   MRSA, PCR NEGATIVE NEGATIVE Final   Staphylococcus aureus NEGATIVE NEGATIVE Final    Comment: (NOTE) The Xpert SA Assay (FDA approved for NASAL specimens in patients  12 years of age and older), is one component of a comprehensive surveillance program. It is not intended to diagnose infection nor to guide or monitor treatment. Performed at [facility], [address] [place], [place] [address]   Aerobic/Anaerobic Culture (surgical/deep wound)     Status: None (Preliminary result)   Collection Time: 07/11/20  3:43 PM   Specimen: PATH Digit amputation; Tissue  Result Value Ref Range Status   Specimen Description   Final    WOUND Performed at [facility], [address] [place], [place] [address]    Special Requests   Final    DIGIT AMPUTATION Performed at [facility], [address] [place],  [address]    Gram Stain   Final    FEW WBC PRESENT,BOTH PMN AND  MONONUCLEAR NO ORGANISMS SEEN    Culture   Final    CULTURE REINCUBATED FOR BETTER GROWTH Performed at [facility], [place] [address] [place], [place] [address]    Report Status PENDING  Incomplete    Coagulation Studies: No results for input(s): LABPROT, INR in the last 72 hours.  Urinalysis: No results for input(s): COLORURINE, LABSPEC, PHURINE, GLUCOSEU, HGBUR, BILIRUBINUR, KETONESUR, PROTEINUR, UROBILINOGEN, NITRITE, LEUKOCYTESUR in the last 72 hours.  Invalid input(s): APPERANCEUR    Imaging: DG MINI C-ARM IMAGE ONLY  Result Date: 07/11/2020 There is no interpretation for this exam.  This order is for images obtained during a surgical procedure.  Please See "Surgeries" Tab for more information regarding the procedure.     Medications:   . sodium chloride 100 mL/hr at 07/13/20 0110   . amLODipine  10 mg Oral Daily  . amoxicillin-clavulanate  1 tablet Oral Q12H  . atorvastatin  40 mg Oral q1800  . Chlorhexidine Gluconate Cloth  6 each Topical Daily  . enoxaparin (LOVENOX) injection  0.5 mg/kg Subcutaneous Q24H  . influenza vac split quadrivalent PF  0.5 mL Intramuscular Tomorrow-1000  . insulin aspart  0-5 Units Subcutaneous  QHS  . insulin aspart  0-9 Units Subcutaneous TID WC  . losartan  50 mg Oral Daily  . sulfamethoxazole-trimethoprim  1 tablet Oral Q12H   acetaminophen, oxyCODONE  Assessment/ Plan:  Mr. [name] is a 54 y.o.  male with hypertension, diabetes mellitus type II, diabetic neuropathy, hyperlipidemia, peripheral vascular disease, history of amputation of right fifth toewho was [facility]21for Osteomyelitis [facility]) [M86.9] Osteomyelitis of right foot, unspecified type [facility]) [M86.9]  Nephrology consulted for hypercalcemia  1. Hypercalcemia:  long standing since 2019. Elevated PTH at 80 with normal kidney function.  Differential includes primary hyperparathyroidism, multiple myeloma or other malignancy, milk-alkali syndrome, granulomatous disease, or vitamin D intoxication.  PTH on 07/10/20 80 elevated, possible primary hyperparathyroidism Phosphorus : 1.9 Will continue follow up as outpatient  2. Hypertension: above the goal Continue Amlodipine and Losartan  3. 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 Hemoglobin A1c of 6.7% on admission. Blood sugar readings elevated On sliding scale insulin while hospitalized   4. Hyponatremia: pseudohyponatremia  Na+134 today   LOS: 4 [name] 10/15/202111:27 AM

## 2020-07-13 NOTE — Discharge Summary (Signed)
Discharge Summary  [name] YYT:[id number] DOB: 08-14-1966  PCP: Valera Castle, MD  Admit date: 07/09/2020 Discharge date: 07/13/2020  Time spent: 35 minutes  Recommendations for Outpatient Follow-up:  1. Follow-up with podiatry 2. Follow-up with infectious disease 3. Follow-up with nephrology 4. Follow-up with your primary care provider 5. Take your medications as prescribed.  Discharge Diagnoses:  Active Hospital Problems   Diagnosis Date Noted  . Osteomyelitis of great toe of right foot ([facility]) 10/30/2018  . Hypercalcemia 07/10/2020  . Type 2 diabetes mellitus with complication, without long-term current use of insulin ([name]) 07/09/2020  . HTN (hypertension) 07/09/2020    Resolved Hospital Problems  No resolved problems to display.    Discharge Condition: Stable  Diet recommendation: Resume previous diet.  Vitals:   07/13/20 0100 07/13/20 0806  BP: (!) 150/90 (!) 150/89  Pulse: 70 71  Resp: 18 18  Temp: 98.8 F (37.1 C) 98.1 F (36.7 C)  SpO2: 98% 100%    History of present illness:  [name] a 54 y.o.malewith medical history significant fortype 2 diabeteswith peripheral neuropathy,PAD,hypertension, history of osteomyelitiss/p amputation of the right 5th toewho presented to the ED on 07/09/20 with worsening wound of the right great toe. The initially injury was 3 weeks prior, scraped dorsal aspect of the R great toe. Seen by PCP 5 days prior to presenting, prescribed dicloxacillin. Outpatient Xray showed probable R great toe osteomyelitis.   In the ED, afebrile, BP uncontrolled with systolic BP up to 275'T. No leukocytosis on CBC. MRI confirmed showing osteomyelitis throughout the R great toe with bony destructive change about the IP joint compatible with septic joint. Negative for myositis or abscess. First MTP and midfoot osteoarthritis.Was started on IV Vancomycin and Zosyn in the ED.   Admitted to hospitalist service.  Seen  by podiatry.  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 Vickki Muff.  Hospital course complicated by hypercalcemia and elevated PTH level.  Seen by nephrology, suspected primary hyperparathyroidism.  Started work-up and recommended to complete work-up outpatient.  May require a nuclear PTH uptake scan.  Received IV fluid for his hypercalcemia.  07/13/20: Seen this morning.  No acute events overnight.  He has no new complaints.  He is eager to go home.   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 Vickki Muff. Continue to follow operative tissue cultures; culture reincubated for better growth. Received empiric IV antibiotics, IV vancomycin and Rocephin Switched to po Augmentin and Bactrim on 07/13/20 x10 days Seen by infectious disease. ESR 35, CRP 1.2 on 07/11/20. Follow-up with infectious disease and podiatry.  Hypercalcemia suspected primary hyperparathyroidism.. No neurologic symptoms, no GI symptoms, no kidney stones, or renal impairment. Calcium 11.4 on 07/12/2020, 11.1 on 07/13/2020. PTH is elevated at 80 from 07/10/2020 Received IV fluids normal saline at 100 cc/h Seen by nephrology, initiated hypercalcemia work-up, will complete work-up outpatient. May require a nuclear PTH uptake scan.   Follow-up with nephrology  Improving hypovolemic hyponatremia Serum sodium, uptrending 134 from 132 Received normal saline at 100 cc/h Now euvolemic. Follow-up with PCP  Type 2 diabetes with hyperglycemia Hemoglobin A1c, 6.7 on 07/09/2020 Resume home hypoglycemics Follow-up with your PCP  Essential hypertension Continue home Norvasc  and  losartan  Hyperlipidemia Continue home Lipitor.    Code Status: Full code    Consultants:  Podiatry  Nephrology  Procedures:  Surgical intervention planned on 07/11/2020 afternoon  Antimicrobials:  IV vancomycin  Rocephin  Switched to Augmentin and Bactrim on 07/13/20 x 10 days   Discharge Exam: BP (!) 150/89 (BP Location: Right Arm)   Pulse 71   Temp 98.1 F (36.7 C) (Oral)   Resp 18   Ht '6\' 3"'  (1.905 m)   Wt 111.1 kg   SpO2 100%   BMI 30.62 kg/m  . General: 54 y.o. year-old male well developed well nourished in no acute distress.  Alert and oriented x3. . Cardiovascular: Regular rate and rhythm with no rubs or gallops.  No thyromegaly or JVD noted.   Marland Kitchen Respiratory: Clear to auscultation with no wheezes or rales. Good inspiratory effort. . Abdomen: Soft nontender nondistended with normal bowel sounds x4 quadrants. . Musculoskeletal: Right foot in surgical dressing. Marland Kitchen Psychiatry: Mood is appropriate for condition and setting  Discharge Instructions You were cared for by a hospitalist during your hospital stay. If you have any questions about your discharge medications or the care you received while you were in the hospital after you are discharged, you can call the unit and asked to speak with the hospitalist on call if the hospitalist that took care of you is not available. Once you are discharged, your primary care physician will handle any further medical issues. Please note that NO REFILLS for any discharge medications will be authorized once you are discharged, as it is imperative that you return to your primary care physician (or establish a relationship with a primary care physician if you do not have one) for your aftercare needs so that they can reassess your need for medications and monitor your lab values.   Allergies as of 07/13/2020   No Known Allergies     Medication List    STOP taking these medications   dicloxacillin 500 MG  capsule Commonly known as: DYNAPEN   ibuprofen 200 MG tablet Commonly known as: ADVIL   lisinopril 20 MG tablet Commonly known as: ZESTRIL   omega-3 acid ethyl esters 1 g capsule Commonly known as: LOVAZA   oxyCODONE 5 MG immediate release tablet Commonly known as: Oxy IR/ROXICODONE     TAKE these medications   amLODipine 10 MG tablet Commonly known as: NORVASC Take 1 tablet (10 mg total) by mouth daily.   amoxicillin-clavulanate 875-125 MG tablet Commonly known as: Augmentin Take 1 tablet by mouth 2 (two) times daily for 10 days.   atorvastatin 40 MG tablet Commonly known as: LIPITOR Take 1 tablet (40 mg total) by mouth daily.   glipiZIDE XL 10 MG 24 hr tablet Generic drug: glipiZIDE Take 2 tablets by mouth daily.   losartan 50 MG tablet Commonly known as: COZAAR Take 1 tablet (50 mg total) by mouth daily.   sulfamethoxazole-trimethoprim 800-160 MG tablet Commonly known as: BACTRIM DS Take 1 tablet by mouth 2 (two) times daily for 10 days.      No Known Allergies  Follow-up Information    Tsosie Billing, MD. Call in 1 day.   Specialty: Infectious Diseases Why: please call for a post hospital follow up appointment; Infection Control will call patient to schedule appointment Contact information: [place] [address] [phone]        Valera Castle, MD. Call in 1 day.   Specialty: Family Medicine Why: please call  for a post hospital follow up appointment; October 18 @ 9:40a Contact information: [place] [address] [phone]        Samara Deist, [place]. Call in 1 day.   Specialty: Podiatry Why: please call for a post hospital follow up appointment; October 19 @ 8:30 a Contact information: [place] [address] [phone]        Lavonia Dana, MD. Call in 1 day.   Specialty: Nephrology Why: Please call for a post hospital follow up appointment; October 19 @  3pm Contact information: [place] [address] [phone]                The results of significant diagnostics from this hospitalization (including imaging, microbiology, ancillary and laboratory) are listed below for reference.    Significant Diagnostic Studies: MR FOOT RIGHT W WO CONTRAST  Result Date: 07/10/2020 CLINICAL DATA:  Skin ulceration on the plantar surface of the right great toe in a diabetic patient. Question osteomyelitis. EXAM: MRI OF THE RIGHT FOREFOOT WITHOUT AND WITH CONTRAST TECHNIQUE: Multiplanar, multisequence MR imaging of the right forefoot was performed before and after the administration of intravenous contrast. CONTRAST:  10 mL GADAVIST IV SOLN COMPARISON:  Plain films right great toe 07/05/2020. FINDINGS: Bones/Joint/Cartilage There is intense marrow edema and enhancement throughout the great toe consistent with osteomyelitis. Bony destructive change is seen about the IP joint and there is a small IP joint effusion. No other evidence of osteomyelitis is identified. No other evidence of osteomyelitis or septic joint is identified. The head of the proximal phalanx of the great toe appears exposed to air. 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 There is also midfoot osteoarthritis which appears mild-to-moderate in degree and worst at the second tarsometatarsal joint. Ligaments Intact. Muscles and Tendons There is atrophy of intrinsic musculature the foot. No intramuscular fluid collection. No tendon tear. Soft tissues Bandaging is present about the great toe. There is soft tissue edema and enhancement in the great toe consistent with cellulitis. IMPRESSION: Osteomyelitis throughout the great toe with bony destructive change about the IP joint compatible with septic joint. Negative for myositis or abscess. First MTP and midfoot osteoarthritis. Electronically Signed   By: Inge Rise M.D.   On:  07/10/2020 07:12   DG Toe Great Right  Result Date: 07/06/2020 CLINICAL DATA:  Soft tissue infection right first toe.  Diabetes. EXAM: RIGHT GREAT TOE COMPARISON:  10/30/2018 FINDINGS: Evidence of soft tissue ulceration along the dorsal aspect of the first toe at the level of the interphalangeal joint. Suggestion of lucency along the medial distal aspect of the first proximal phalanx suggesting osteomyelitis. Calcification along the plantar aspect of the first MTP joint. IMPRESSION: Soft tissue ulceration along the dorsal aspect of the first toe at the level of the interphalangeal joint. Suggestion of osteomyelitis involving the medial distal aspect of the first proximal phalanx. Electronically Signed   By: Marin Olp M.D.   On: 07/06/2020 14:34   DG MINI C-ARM IMAGE ONLY  Result Date: 07/11/2020 There is no interpretation for this exam.  This order is for images obtained during a surgical procedure.  Please See "Surgeries" Tab for more information regarding the procedure.    Microbiology: Recent Results (from the past 240 hour(s))  Respiratory Panel by RT PCR (Flu A&B, Covid) - Nasopharyngeal Swab     Status: None   Collection Time: 07/09/20  9:28 PM   Specimen:  Nasopharyngeal Swab  Result Value Ref Range Status   SARS Coronavirus 2 by RT PCR NEGATIVE NEGATIVE Final    Comment: (NOTE) SARS-CoV-2 target nucleic acids are NOT DETECTED.  The SARS-CoV-2 RNA is generally detectable in upper respiratoy specimens during the acute phase of infection. The lowest concentration of SARS-CoV-2 viral copies this assay can detect is 131 copies/mL. A negative result does not preclude SARS-Cov-2 infection and should not be used as the sole basis for treatment or other patient management decisions. A negative result may occur with  improper specimen collection/handling, submission of specimen other than nasopharyngeal swab, presence of viral mutation(s) within the areas targeted by this assay, and  inadequate number of viral copies (<131 copies/mL). A negative result must be combined with clinical observations, patient history, and epidemiological information. The expected result is Negative.  Fact Sheet for Patients:  PinkCheek.be  Fact Sheet for Healthcare Providers:  GravelBags.it  This test is no t yet approved or cleared by the [place] FDA and  has been authorized for detection and/or diagnosis of SARS-CoV-2 by FDA under an Emergency Use Authorization (EUA). This EUA will remain  in effect (meaning this test can be used) for the duration of the COVID-19 declaration under Section 564(b)(1) of the Act, 21 U.S.C. section 360bbb-3(b)(1), unless the authorization is terminated or revoked sooner.     Influenza A by PCR NEGATIVE NEGATIVE Final   Influenza B by PCR NEGATIVE NEGATIVE Final    Comment: (NOTE) The Xpert Xpress SARS-CoV-2/FLU/RSV assay is intended as an aid in  the diagnosis of influenza from Nasopharyngeal swab specimens and  should not be used as a sole basis for treatment. Nasal washings and  aspirates are unacceptable for Xpert Xpress SARS-CoV-2/FLU/RSV  testing.  Fact Sheet for Patients: PinkCheek.be  Fact Sheet for Healthcare Providers: GravelBags.it  This test is not yet approved or cleared by the [place] FDA and  has been authorized for detection and/or diagnosis of SARS-CoV-2 by  FDA under an Emergency Use Authorization (EUA). This EUA will remain  in effect (meaning this test can be used) for the duration of the  Covid-19 declaration under Section 564(b)(1) of the Act, 21  U.S.C. section 360bbb-3(b)(1), unless the authorization is  terminated or revoked. Performed at [facility], [address], [place], [place] [address]   Surgical pcr screen     Status: None   Collection Time: 07/10/20  4:20 PM   Specimen:  Nasal Mucosa; Nasal Swab  Result Value Ref Range Status   MRSA, PCR NEGATIVE NEGATIVE Final   Staphylococcus aureus NEGATIVE NEGATIVE Final    Comment: (NOTE) The Xpert SA Assay (FDA approved for NASAL specimens in patients 71 years of age and older), is one component of a comprehensive surveillance program. It is not intended to diagnose infection nor to guide or monitor treatment. Performed at [facility], [address] [place], [place] [address]   Aerobic/Anaerobic Culture (surgical/deep wound)     Status: None (Preliminary result)   Collection Time: 07/11/20  3:43 PM   Specimen: PATH Digit amputation; Tissue  Result Value Ref Range Status   Specimen Description   Final    WOUND Performed at [facility], [address] [place], [place] [address]    Special Requests   Final    DIGIT AMPUTATION Performed at [facility], [address] [place], [place] [address]    Gram Stain   Final    FEW WBC PRESENT,BOTH PMN AND MONONUCLEAR  NO ORGANISMS SEEN    Culture   Final    CULTURE REINCUBATED FOR BETTER GROWTH Performed at [facility], [place] [address] [place], [place] [address]    Report Status PENDING  Incomplete     Labs: Basic Metabolic Panel: Recent Labs  Lab 07/09/20 1258 07/10/20 0534 07/11/20 0524 07/12/20 0420 07/12/20 0655 07/13/20 0417  NA 137 134* 133* 132*  --  134*  K 4.0 4.1 5.0 4.5  --  4.9  CL 103 101 100 100  --  100  CO2 '25 25 27 23  ' --  28  GLUCOSE 182* 141* 231* 309*  --  281*  BUN '10 9 10 13  ' --  13  CREATININE 0.73 0.71 0.84 0.72  --  0.85  CALCIUM 11.1* 11.4* 11.8* 11.4*  --  11.1*  PHOS  --   --   --   --  1.9*  --    Liver Function Tests: Recent Labs  Lab 07/09/20 1258  AST 23  ALT 23  ALKPHOS 96  BILITOT 1.1  PROT 7.5  ALBUMIN 3.8   No results for input(s): LIPASE, AMYLASE in the last 168 hours. No results for input(s): AMMONIA in the last 168 hours. CBC: Recent Labs  Lab  07/09/20 1258 07/10/20 0534 07/12/20 0430 07/13/20 0417  WBC 6.1 5.6 9.8 5.9  NEUTROABS 4.4  --   --  3.5  HGB 14.8 14.7 15.3 13.6  HCT 43.1 43.6 44.3 40.9  MCV 88.1 86.9 87.0 88.7  PLT 257 226 261 202   Cardiac Enzymes: No results for input(s): CKTOTAL, CKMB, CKMBINDEX, TROPONINI in the last 168 hours. BNP: BNP (last 3 results) No results for input(s): BNP in the last 8760 hours.  ProBNP (last 3 results) No results for input(s): PROBNP in the last 8760 hours.  CBG: Recent Labs  Lab 07/12/20 0727 07/12/20 1144 07/12/20 1640 07/12/20 2112 07/13/20 0804  GLUCAP 267* 193* 243* 194* 242*       Signed:  Kayleen Memos, MD [facility] 07/13/2020, 11:30 AM

## 2020-07-13 NOTE — Plan of Care (Signed)
No acute events overnight.    Problem: Education: Goal: Ability to describe self-care measures that may prevent or decrease complications (Diabetes Survival Skills Education) will improve Outcome: Progressing Goal: Individualized Educational Video(s) Outcome: Progressing   Problem: Coping: Goal: Ability to adjust to condition or change in health will improve Outcome: Progressing   Problem: Fluid Volume: Goal: Ability to maintain a balanced intake and output will improve Outcome: Progressing   Problem: Health Behavior/Discharge Planning: Goal: Ability to identify and utilize available resources and services will improve Outcome: Progressing Goal: Ability to manage health-related needs will improve Outcome: Progressing   Problem: Metabolic: Goal: Ability to maintain appropriate glucose levels will improve Outcome: Progressing   Problem: Nutritional: Goal: Maintenance of adequate nutrition will improve Outcome: Progressing Goal: Progress toward achieving an optimal weight will improve Outcome: Progressing   Problem: Skin Integrity: Goal: Risk for impaired skin integrity will decrease Outcome: Progressing   Problem: Tissue Perfusion: Goal: Adequacy of tissue perfusion will improve Outcome: Progressing   Problem: Education: Goal: Knowledge of the prescribed therapeutic regimen will improve Outcome: Progressing Goal: Ability to verbalize activity precautions or restrictions will improve Outcome: Progressing Goal: Understanding of discharge needs will improve Outcome: Progressing   Problem: Activity: Goal: Ability to perform//tolerate increased activity and mobilize with assistive devices will improve Outcome: Progressing   Problem: Clinical Measurements: Goal: Postoperative complications will be avoided or minimized Outcome: Progressing   Problem: Self-Care: Goal: Ability to meet self-care needs will improve Outcome: Progressing   Problem: Self-Concept: Goal:  Ability to maintain and perform role responsibilities to the fullest extent possible will improve Outcome: Progressing   Problem: Pain Management: Goal: Pain level will decrease with appropriate interventions Outcome: Progressing   Problem: Skin Integrity: Goal: Demonstration of wound healing without infection will improve Outcome: Progressing   Problem: Education: Goal: Knowledge of General Education information will improve Description: Including pain rating scale, medication(s)/side effects and non-pharmacologic comfort measures Outcome: Progressing   Problem: Health Behavior/Discharge Planning: Goal: Ability to manage health-related needs will improve Outcome: Progressing   Problem: Clinical Measurements: Goal: Ability to maintain clinical measurements within normal limits will improve Outcome: Progressing Goal: Will remain free from infection Outcome: Progressing Goal: Diagnostic test results will improve Outcome: Progressing Goal: Respiratory complications will improve Outcome: Progressing Goal: Cardiovascular complication will be avoided Outcome: Progressing   Problem: Activity: Goal: Risk for activity intolerance will decrease Outcome: Progressing   Problem: Nutrition: Goal: Adequate nutrition will be maintained Outcome: Progressing   Problem: Coping: Goal: Level of anxiety will decrease Outcome: Progressing   Problem: Elimination: Goal: Will not experience complications related to bowel motility Outcome: Progressing Goal: Will not experience complications related to urinary retention Outcome: Progressing   Problem: Pain Managment: Goal: General experience of comfort will improve Outcome: Progressing   Problem: Safety: Goal: Ability to remain free from injury will improve Outcome: Progressing   Problem: Skin Integrity: Goal: Risk for impaired skin integrity will decrease Outcome: Progressing   

## 2020-07-13 NOTE — Progress Notes (Signed)
Pts BP 168/98, MD made aware. Per MD pt ok to discharge.

## 2020-07-14 LAB — QUANTIFERON-TB GOLD PLUS (RQFGPL)
QuantiFERON Mitogen Value: 5.81 IU/mL
QuantiFERON Nil Value: 0 IU/mL
QuantiFERON TB1 Ag Value: 0 IU/mL
QuantiFERON TB2 Ag Value: 0.01 IU/mL

## 2020-07-14 LAB — QUANTIFERON-TB GOLD PLUS: QuantiFERON-TB Gold Plus: NEGATIVE

## 2020-07-16 LAB — SURGICAL PATHOLOGY

## 2020-07-16 LAB — PROTEIN ELECTROPHORESIS, SERUM
A/G Ratio: 1 (ref 0.7–1.7)
Albumin ELP: 3.3 g/dL (ref 2.9–4.4)
Alpha-1-Globulin: 0.3 g/dL (ref 0.0–0.4)
Alpha-2-Globulin: 0.9 g/dL (ref 0.4–1.0)
Beta Globulin: 1.1 g/dL (ref 0.7–1.3)
Gamma Globulin: 1 g/dL (ref 0.4–1.8)
Globulin, Total: 3.4 g/dL (ref 2.2–3.9)
Total Protein ELP: 6.7 g/dL (ref 6.0–8.5)

## 2020-07-16 LAB — AEROBIC/ANAEROBIC CULTURE W GRAM STAIN (SURGICAL/DEEP WOUND)

## 2020-07-19 LAB — 25-HYDROXY VITAMIN D LCMS D2+D3
25-Hydroxy, Vitamin D-2: <1 ng/mL
25-Hydroxy, Vitamin D-3: 11 ng/mL
25-Hydroxy, Vitamin D: 11 ng/mL — ABNORMAL LOW

## 2020-10-03 IMAGING — DX DG FOOT COMPLETE 3+V*L*
3 series · 3 of 3 positions shown · non-contrast
Comparison: None.

CLINICAL DATA: Acute LEFT foot pain. Initial encounter.

EXAM:
LEFT FOOT - COMPLETE 3+ VIEW

[foot ap]
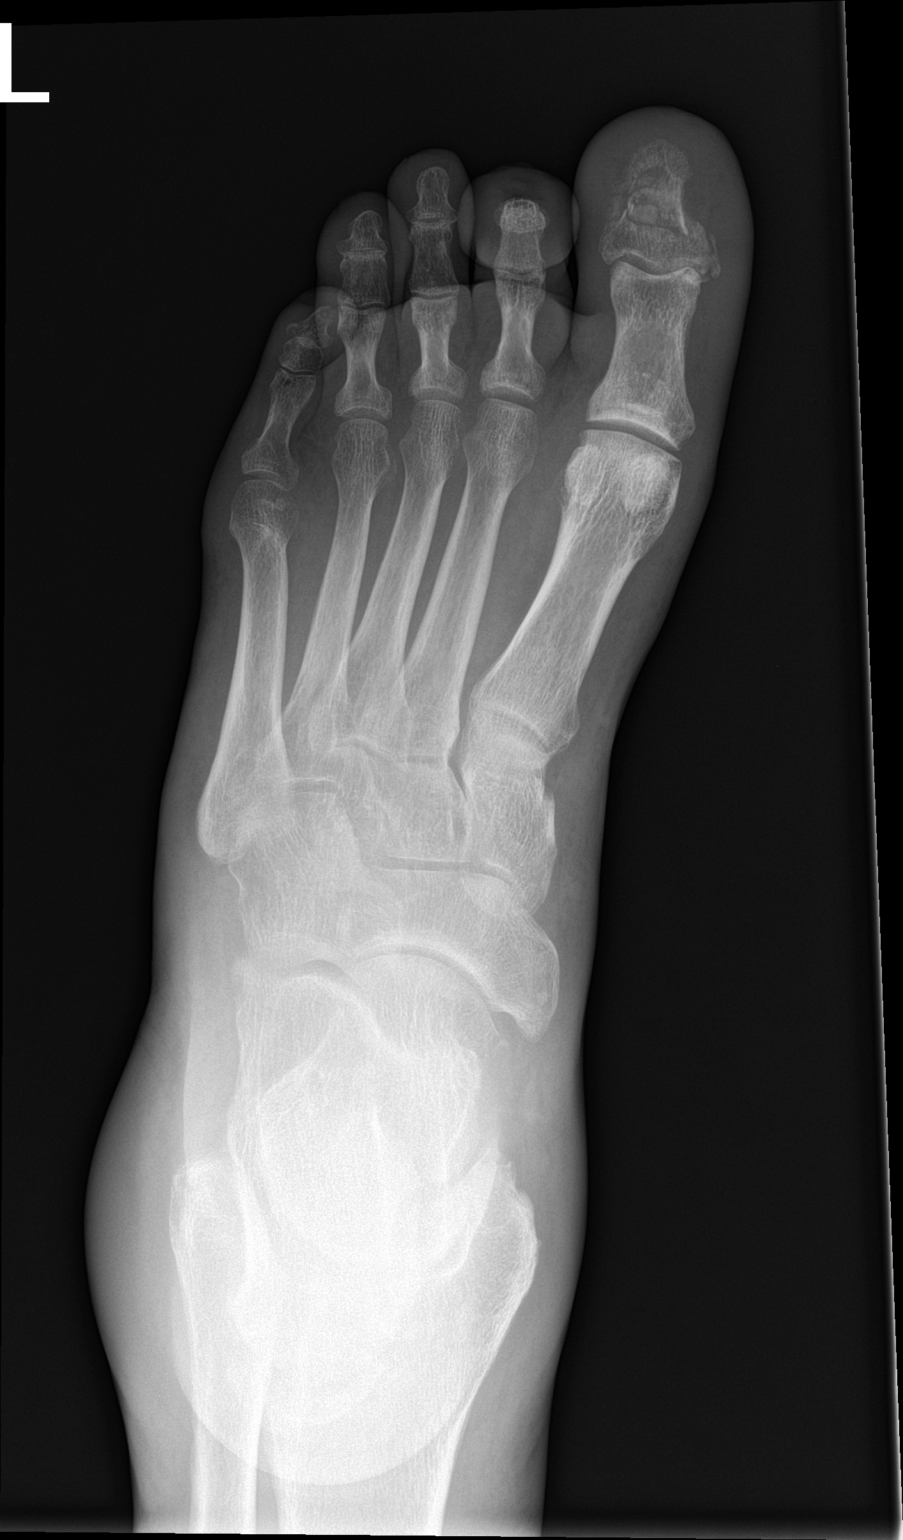

[foot obl]
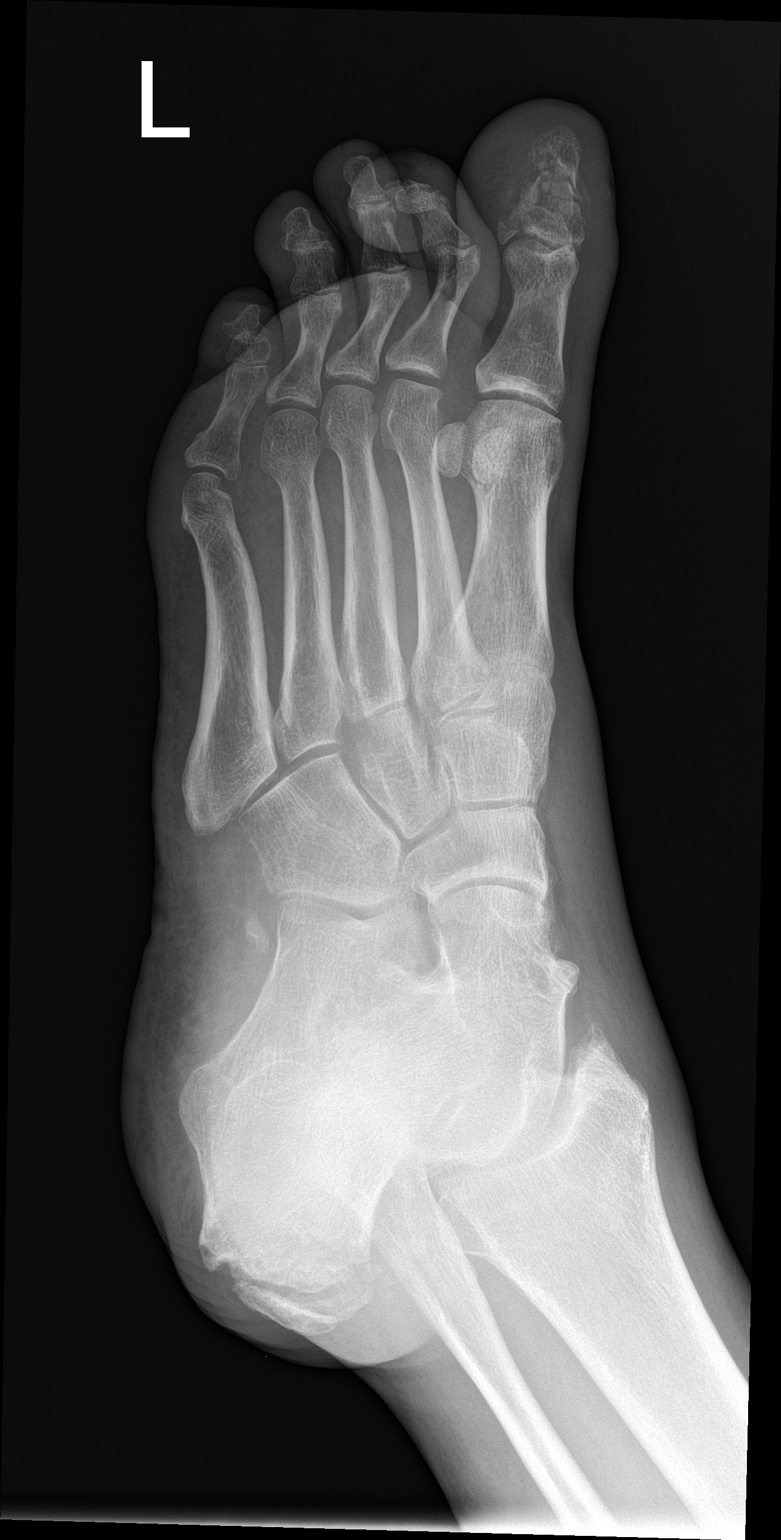

[foot lat]
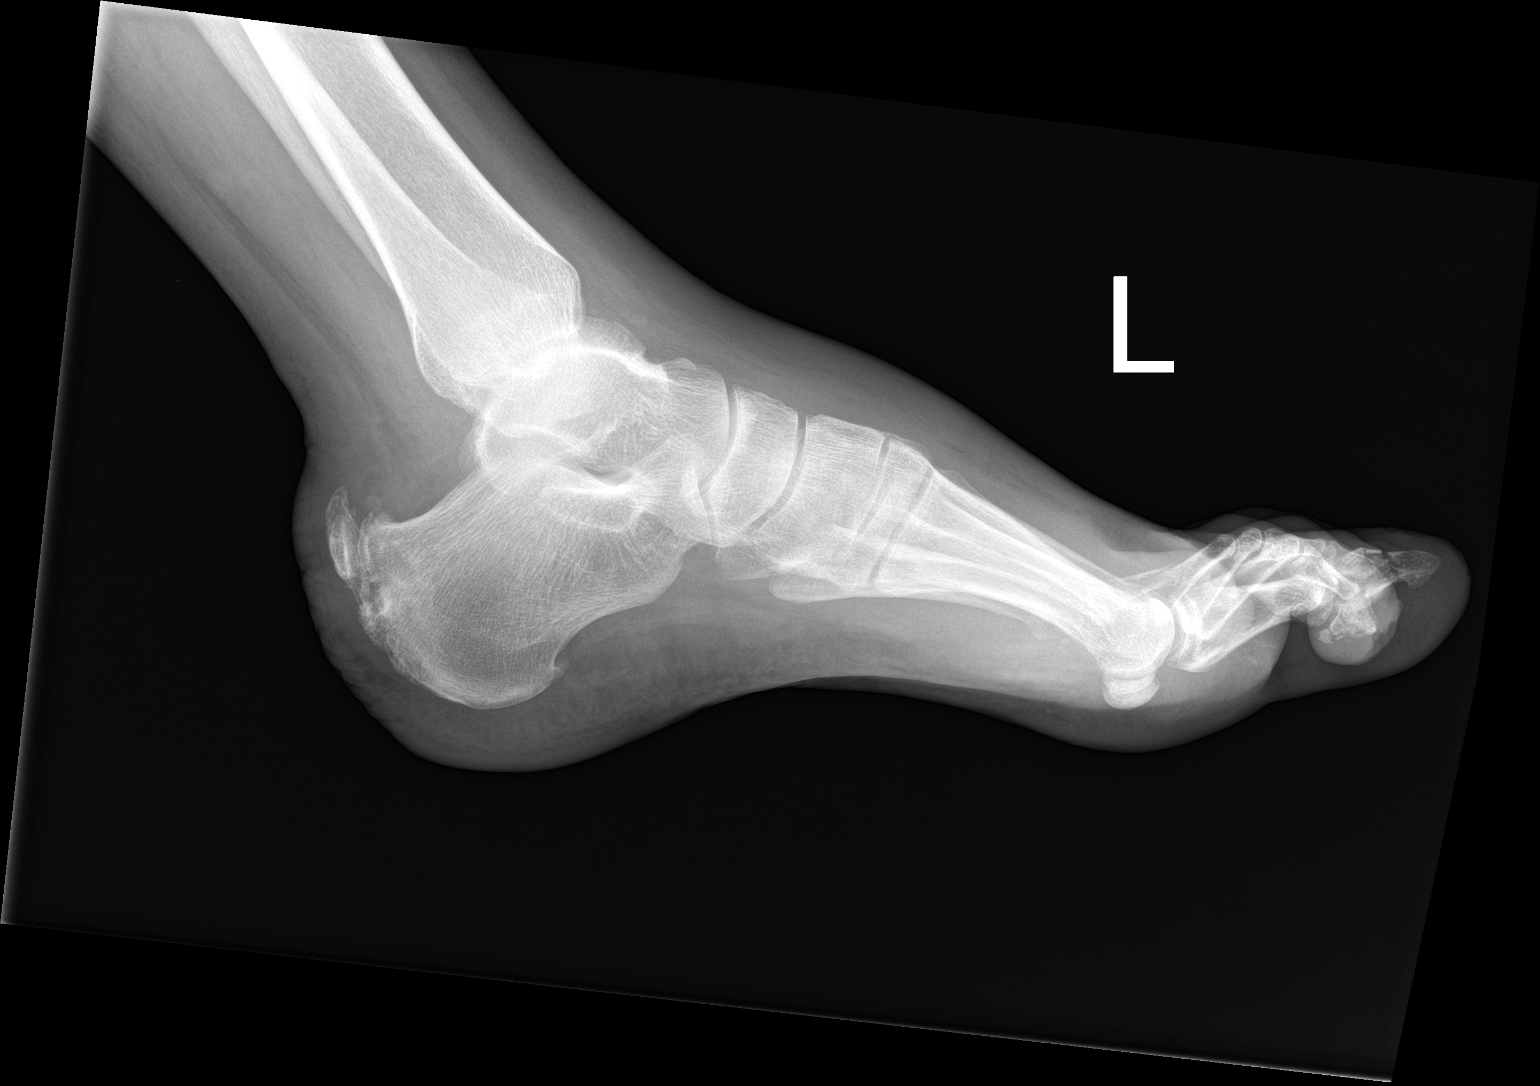

[3 of 3 positions shown; findings below may reference images not displayed]

FINDINGS: A comminuted fracture of the great toe distal phalanx noted
extending to the interphalangeal joint.

A possible fracture of the 2nd toe distal phalanx is noted.

No other fracture, subluxation or dislocation noted.

The Lisfranc joints are unremarkable.
IMPRESSION: Comminuted fracture of the great toe distal phalanx and possible
fracture of the 2nd toe distal phalanx.

## 2021-05-09 ENCOUNTER — Emergency Department: Payer: Self-pay

## 2021-05-09 ENCOUNTER — Inpatient Hospital Stay: Payer: Self-pay

## 2021-05-09 ENCOUNTER — Other Ambulatory Visit: Payer: Self-pay

## 2021-05-09 ENCOUNTER — Encounter: Payer: Self-pay | Admitting: Family Medicine

## 2021-05-09 ENCOUNTER — Inpatient Hospital Stay
Admission: EM | Admit: 2021-05-09 | Discharge: 2021-05-22 | DRG: 854 | Disposition: A | Discharge status: Home Health | Payer: Self-pay | Attending: Internal Medicine | Admitting: Internal Medicine

## 2021-05-09 DIAGNOSIS — L97518 Non-pressure chronic ulcer of other part of right foot with other specified severity: Secondary | ICD-10-CM | POA: Diagnosis present

## 2021-05-09 DIAGNOSIS — A419 Sepsis, unspecified organism: Secondary | ICD-10-CM

## 2021-05-09 DIAGNOSIS — A4159 Other Gram-negative sepsis: Principal | ICD-10-CM | POA: Diagnosis present

## 2021-05-09 DIAGNOSIS — T8141XA Infection following a procedure, superficial incisional surgical site, initial encounter: Secondary | ICD-10-CM | POA: Diagnosis not present

## 2021-05-09 DIAGNOSIS — I10 Essential (primary) hypertension: Secondary | ICD-10-CM | POA: Diagnosis present

## 2021-05-09 DIAGNOSIS — Z7984 Long term (current) use of oral hypoglycemic drugs: Secondary | ICD-10-CM

## 2021-05-09 DIAGNOSIS — E785 Hyperlipidemia, unspecified: Secondary | ICD-10-CM | POA: Diagnosis present

## 2021-05-09 DIAGNOSIS — E11621 Type 2 diabetes mellitus with foot ulcer: Secondary | ICD-10-CM | POA: Diagnosis present

## 2021-05-09 DIAGNOSIS — E871 Hypo-osmolality and hyponatremia: Secondary | ICD-10-CM | POA: Diagnosis present

## 2021-05-09 DIAGNOSIS — E118 Type 2 diabetes mellitus with unspecified complications: Secondary | ICD-10-CM | POA: Diagnosis present

## 2021-05-09 DIAGNOSIS — E876 Hypokalemia: Secondary | ICD-10-CM | POA: Diagnosis present

## 2021-05-09 DIAGNOSIS — E1140 Type 2 diabetes mellitus with diabetic neuropathy, unspecified: Secondary | ICD-10-CM | POA: Diagnosis present

## 2021-05-09 DIAGNOSIS — L03031 Cellulitis of right toe: Secondary | ICD-10-CM | POA: Diagnosis present

## 2021-05-09 DIAGNOSIS — Z20822 Contact with and (suspected) exposure to covid-19: Secondary | ICD-10-CM | POA: Diagnosis present

## 2021-05-09 DIAGNOSIS — B964 Proteus (mirabilis) (morganii) as the cause of diseases classified elsewhere: Secondary | ICD-10-CM | POA: Diagnosis present

## 2021-05-09 DIAGNOSIS — Z8249 Family history of ischemic heart disease and other diseases of the circulatory system: Secondary | ICD-10-CM

## 2021-05-09 DIAGNOSIS — M86071 Acute hematogenous osteomyelitis, right ankle and foot: Secondary | ICD-10-CM | POA: Diagnosis present

## 2021-05-09 DIAGNOSIS — L97509 Non-pressure chronic ulcer of other part of unspecified foot with unspecified severity: Secondary | ICD-10-CM

## 2021-05-09 DIAGNOSIS — Z79899 Other long term (current) drug therapy: Secondary | ICD-10-CM

## 2021-05-09 DIAGNOSIS — M869 Osteomyelitis, unspecified: Secondary | ICD-10-CM

## 2021-05-09 DIAGNOSIS — E1169 Type 2 diabetes mellitus with other specified complication: Secondary | ICD-10-CM | POA: Diagnosis present

## 2021-05-09 DIAGNOSIS — E11628 Type 2 diabetes mellitus with other skin complications: Secondary | ICD-10-CM | POA: Diagnosis present

## 2021-05-09 DIAGNOSIS — F17210 Nicotine dependence, cigarettes, uncomplicated: Secondary | ICD-10-CM | POA: Diagnosis present

## 2021-05-09 DIAGNOSIS — E1165 Type 2 diabetes mellitus with hyperglycemia: Secondary | ICD-10-CM | POA: Diagnosis present

## 2021-05-09 LAB — COMPREHENSIVE METABOLIC PANEL
ALT: 46 U/L — ABNORMAL HIGH (ref 0–44)
AST: 52 U/L — ABNORMAL HIGH (ref 15–41)
Albumin: 2.9 g/dL — ABNORMAL LOW (ref 3.5–5.0)
Alkaline Phosphatase: 114 U/L (ref 38–126)
Anion gap: 3 — ABNORMAL LOW (ref 5–15)
BUN: 12 mg/dL (ref 6–20)
CO2: 31 mmol/L (ref 22–32)
Calcium: 13.5 mg/dL (ref 8.9–10.3)
Chloride: 96 mmol/L — ABNORMAL LOW (ref 98–111)
Creatinine, Ser: 0.7 mg/dL (ref 0.61–1.24)
GFR, Estimated: >60 mL/min (ref >60)
Glucose, Bld: 157 mg/dL — ABNORMAL HIGH (ref 70–99)
Potassium: 3.4 mmol/L — ABNORMAL LOW (ref 3.5–5.1)
Sodium: 130 mmol/L — ABNORMAL LOW (ref 135–145)
Total Bilirubin: 1.3 mg/dL — ABNORMAL HIGH (ref 0.3–1.2)
Total Protein: 7.2 g/dL (ref 6.5–8.1)

## 2021-05-09 LAB — URINALYSIS, COMPLETE (UACMP) WITH MICROSCOPIC
Bacteria, UA: NONE SEEN
Bilirubin Urine: NEGATIVE
Glucose, UA: NEGATIVE mg/dL
Hgb urine dipstick: NEGATIVE
Ketones, ur: NEGATIVE mg/dL
Leukocytes,Ua: NEGATIVE
Nitrite: NEGATIVE
Protein, ur: NEGATIVE mg/dL
Specific Gravity, Urine: 1.014 (ref 1.005–1.030)
Squamous Epithelial / HPF: NONE SEEN (ref 0–5)
pH: 5 (ref 5.0–8.0)

## 2021-05-09 LAB — CBC WITH DIFFERENTIAL/PLATELET
Abs Immature Granulocytes: 0.08 10*3/uL — ABNORMAL HIGH (ref 0.00–0.07)
Basophils Absolute: 0.1 10*3/uL (ref 0.0–0.1)
Basophils Relative: 0 %
Eosinophils Absolute: 0 10*3/uL (ref 0.0–0.5)
Eosinophils Relative: 0 %
HCT: 42.3 % (ref 39.0–52.0)
Hemoglobin: 15 g/dL (ref 13.0–17.0)
Immature Granulocytes: 1 %
Lymphocytes Relative: 10 %
Lymphs Abs: 1.3 10*3/uL (ref 0.7–4.0)
MCH: 33.3 pg (ref 26.0–34.0)
MCHC: 35.5 g/dL (ref 30.0–36.0)
MCV: 93.8 fL (ref 80.0–100.0)
Monocytes Absolute: 1 10*3/uL (ref 0.1–1.0)
Monocytes Relative: 8 %
Neutro Abs: 10.3 10*3/uL — ABNORMAL HIGH (ref 1.7–7.7)
Neutrophils Relative %: 81 %
Platelets: 244 10*3/uL (ref 150–400)
RBC: 4.51 MIL/uL (ref 4.22–5.81)
RDW: 11.9 % (ref 11.5–15.5)
WBC: 12.8 10*3/uL — ABNORMAL HIGH (ref 4.0–10.5)
nRBC: 0 % (ref 0.0–0.2)

## 2021-05-09 LAB — SEDIMENTATION RATE: Sed Rate: 19 mm/hr (ref 0–20)

## 2021-05-09 LAB — PROTIME-INR
INR: 1.2 (ref 0.8–1.2)
Prothrombin Time: 15.1 seconds (ref 11.4–15.2)

## 2021-05-09 LAB — LACTIC ACID, PLASMA: Lactic Acid, Venous: 1.9 mmol/L (ref 0.5–1.9)

## 2021-05-09 MED ORDER — ACETAMINOPHEN 650 MG RE SUPP: 650.0000 mg | Freq: Four times a day (QID) | RECTAL | Status: DC | PRN

## 2021-05-09 MED ORDER — HYDROMORPHONE HCL 1 MG/ML IJ SOLN
1.0000 mg | Freq: Once | INTRAMUSCULAR | Status: AC
Start: 2021-05-09 — End: 2021-05-10
  Administered 2021-05-10: 1 mg via INTRAVENOUS
  Filled 2021-05-09: qty 1

## 2021-05-09 MED ORDER — PIPERACILLIN-TAZOBACTAM 3.375 G IVPB 30 MIN: 3.3750 g | Freq: Four times a day (QID) | INTRAVENOUS | Status: DC

## 2021-05-09 MED ORDER — MORPHINE SULFATE (PF) 2 MG/ML IV SOLN
2.0000 mg | INTRAVENOUS | Status: DC | PRN
Start: 2021-05-09 — End: 2021-05-11

## 2021-05-09 MED ORDER — VANCOMYCIN HCL 2000 MG/400ML IV SOLN
2000.0000 mg | Freq: Two times a day (BID) | INTRAVENOUS | Status: DC
  Administered 2021-05-10 (×2): 2000 mg via INTRAVENOUS
  Filled 2021-05-09 (×3): qty 400

## 2021-05-09 MED ORDER — VANCOMYCIN HCL 2000 MG/400ML IV SOLN
2000.0000 mg | Freq: Once | INTRAVENOUS | Status: AC
  Administered 2021-05-09: 2000 mg via INTRAVENOUS
  Filled 2021-05-09: qty 400

## 2021-05-09 MED ORDER — ATORVASTATIN CALCIUM 20 MG PO TABS
40.0000 mg | ORAL_TABLET | Freq: Every day | ORAL | Status: DC
  Administered 2021-05-09 – 2021-05-21 (×13): 40 mg via ORAL
  Filled 2021-05-09 (×13): qty 2

## 2021-05-09 MED ORDER — ONDANSETRON HCL 4 MG/2ML IJ SOLN: 4.0000 mg | Freq: Once | INTRAMUSCULAR | Status: DC

## 2021-05-09 MED ORDER — ACETAMINOPHEN 325 MG PO TABS
650.0000 mg | ORAL_TABLET | Freq: Four times a day (QID) | ORAL | Status: DC | PRN
  Administered 2021-05-13 – 2021-05-21 (×4): 650 mg via ORAL
  Filled 2021-05-09 (×4): qty 2

## 2021-05-09 MED ORDER — SODIUM CHLORIDE 0.9 % IV SOLN: Freq: Once | INTRAVENOUS | Status: AC

## 2021-05-09 MED ORDER — PIPERACILLIN-TAZOBACTAM 3.375 G IVPB 30 MIN
3.3750 g | Freq: Once | INTRAVENOUS | Status: AC
  Administered 2021-05-09: 3.375 g via INTRAVENOUS
  Filled 2021-05-09: qty 50

## 2021-05-09 MED ORDER — GLIPIZIDE ER 10 MG PO TB24
20.0000 mg | ORAL_TABLET | Freq: Every day | ORAL | Status: DC
  Filled 2021-05-09: qty 2

## 2021-05-09 MED ORDER — TRAZODONE HCL 50 MG PO TABS: 25.0000 mg | ORAL_TABLET | Freq: Every evening | ORAL | Status: DC | PRN

## 2021-05-09 MED ORDER — ONDANSETRON HCL 4 MG/2ML IJ SOLN: 4.0000 mg | Freq: Four times a day (QID) | INTRAMUSCULAR | Status: DC | PRN

## 2021-05-09 MED ORDER — VANCOMYCIN HCL IN DEXTROSE 1-5 GM/200ML-% IV SOLN: 1000.0000 mg | Freq: Once | INTRAVENOUS | Status: DC

## 2021-05-09 MED ORDER — PIPERACILLIN-TAZOBACTAM 3.375 G IVPB
3.3750 g | Freq: Three times a day (TID) | INTRAVENOUS | Status: DC
  Administered 2021-05-10 – 2021-05-14 (×14): 3.375 g via INTRAVENOUS
  Filled 2021-05-09 (×14): qty 50

## 2021-05-09 MED ORDER — MAGNESIUM HYDROXIDE 400 MG/5ML PO SUSP
30.0000 mL | Freq: Every day | ORAL | Status: DC | PRN
  Administered 2021-05-12 – 2021-05-18 (×2): 30 mL via ORAL
  Filled 2021-05-09 (×2): qty 30

## 2021-05-09 MED ORDER — ENOXAPARIN SODIUM 40 MG/0.4ML IJ SOSY: 40.0000 mg | PREFILLED_SYRINGE | INTRAMUSCULAR | Status: DC

## 2021-05-09 MED ORDER — ONDANSETRON HCL 4 MG PO TABS: 4.0000 mg | ORAL_TABLET | Freq: Four times a day (QID) | ORAL | Status: DC | PRN

## 2021-05-09 NOTE — Consult Note (Signed)
Pharmacy Antibiotic Note  Rosa Guilbault is a 55 y.o. male with medical history including right foot great toe OM s/p amputation, diabetes, chronic hypercalcemia admitted on 05/09/2021 with right foot  DFI / possible OM .  Per chart review, patient appears to have had history of polymicrobial wound infections (Strep, Staph, PSA, bacteroides, Enterobacter). Pharmacy has been consulted for vancomycin dosing. Patient is also ordered Zosyn.  Plan:  Vancomycin 2 g IV q12h --Calculated AUC: 528, Cmin 13.3 --Daily Scr per protocol --Levels at steady state as clinically indicated  Continue to follow cultures as they become available. Follow-up ability to narrow antibiotics. Monitor closely for nephrotoxicity while on vancomycin and Zosyn combination.   Height: '6\' 3"'$  (190.5 cm) Weight: 97.5 kg (215 lb) IBW/kg (Calculated) : 84.5  Temp (24hrs), Avg:97.8 F (36.6 C), Min:97.8 F (36.6 C), Max:97.8 F (36.6 C)  Recent Labs  Lab 05/09/21 1425  WBC 12.8*  CREATININE 0.70  LATICACIDVEN 1.9    Estimated Creatinine Clearance: 124.7 mL/min (by C-G formula based on SCr of 0.7 mg/dL).    No Known Allergies  Antimicrobials this admission: Vancomycin 8/11 >>  Zosyn 8/11 >>   Dose adjustments this admission: N/A  Microbiology results: 8/11 BCx: pending  Thank you for allowing pharmacy to be a part of this patient's care.  Benita Gutter 05/09/2021 8:04 PM

## 2021-05-09 NOTE — ED Notes (Signed)
MD Paduchowski informed of pt's lab value of 13.5 for Calcium. Orders given at this time

## 2021-05-09 NOTE — ED Notes (Signed)
Patient to MRI via stretcher.

## 2021-05-09 NOTE — Consult Note (Signed)
PHARMACY -  BRIEF ANTIBIOTIC NOTE   Pharmacy has received consult(s) for vancomycin and Zosyn from an ED provider.  The patient's profile has been reviewed for ht/wt/allergies/indication/available labs.    One time order(s) placed for  --Vancomycin 2 g IV --Zosyn 3.375 g IV  Further antibiotics/pharmacy consults should be ordered by admitting physician if indicated.                       Thank you, Benita Gutter 05/09/2021  5:46 PM

## 2021-05-09 NOTE — ED Notes (Signed)
Patient returned from MRI via stretcher.

## 2021-05-09 NOTE — H&P (Addendum)
[place]   PATIENT NAME: [name]    MR#:  [id number]  DATE OF BIRTH:  [birth year]  DATE OF ADMISSION:  05/09/2021  PRIMARY CARE PHYSICIAN: Pcp, No   Patient is coming from: Home  REQUESTING/REFERRING PHYSICIAN: Duffy Bruce, MD  CHIEF COMPLAINT:   Chief Complaint  Patient presents with   Wound Infection    HISTORY OF PRESENT ILLNESS:  [name] is a 55 y.o. Caucasian male with medical history significant for type 2 diabetes mellitus, hypertension and dyslipidemia, who presented to the emergency room with acute onset of infected right foot wound.  Due to previous history of right foot wound and osteomyelitis he had a great toe and fifth toe amputated.  Swelling has been extending to his whole foot.  His wound was initially over the plantar aspect of his distal foot and later he developed a wound over the lateral aspect.  He now notices foul-smelling drainage and increasing pain that is becoming throbbing in graded 10/10 in severity, worsening with weightbearing.  The redness and pain has been significantly worsening despite wound care that was being provided by his friend who is a retired [employer] scientist.  No fever or chills.  No chest pain or dyspnea or palpitations.  No nausea or vomiting or abdominal pain.  No dysuria, oliguria or hematuria or flank pain.  ED Course: When he came to the ER, blood pressure was 121/93 with otherwise normal vital signs.  Labs revealed mild hyponatremia and mild hypokalemia as well as hypercalcemia.  CBC showed leukocytosis of 12.8 respiratory panel is currently pending.  UA showed 6-10 WBCs with no bacteria and negative nitrite.  Blood culture Was sent. Imaging: Right foot x-ray showed the following: Mild displaced fracture of the distal second metatarsal neck, which could be pathologic from underlying infection/osteomyelitis. Forefoot soft tissue swelling with plantar soft tissue ulcer.   Subluxation/dislocation of the second and third MTP  joints.   Prior great toe amputation and fifth ray amputations.  The patient was given 1 L bolus of IV normal saline followed 125 mill per hour as well as IV vancomycin  and Zosyn, 4 mg IV Zofran and 1 mg of IV Dilaudid.  He will be admitted to a medical hall monitored bed for further evaluation and management. PAST MEDICAL HISTORY:   Past Medical History:  Diagnosis Date   Diabetes ([place])    Hypertension   -Dyslipidemia - History of right foot osteomyelitis  PAST SURGICAL HISTORY:   Past Surgical History:  Procedure Laterality Date   AMPUTATION TOE Right 07/11/2020   Procedure: AMPUTATION TOE-Right Great Toe;  Surgeon: Samara Deist, DPM;  Location: [facility] OR[facility];  Service: Podiatry;  Laterality: Right;   IRRIGATION AND DEBRIDEMENT ABSCESS Right 10/31/2018   Procedure: IRRIGATION AND DEBRIDEMENT ABSCESS;  Surgeon: Samara Deist, DPM;  Location: [facility];  Service: Podiatry;  Laterality: Right;   IRRIGATION AND DEBRIDEMENT FOOT Right 11/02/2018   Procedure: IRRIGATION AND DEBRIDEMENT FOOT;  Surgeon: Sharlotte Alamo, DPM;  Location: [facility];  Service: Podiatry;  Laterality: Right;    SOCIAL HISTORY:   Social History   Tobacco Use   Smoking status: Light Smoker    Types: Cigars   Smokeless tobacco: Never  Substance Use Topics   Alcohol use: Yes    Comment: "too much"     FAMILY HISTORY:   Family History  Problem Relation Age of Onset   CAD Father    CAD Brother     DRUG ALLERGIES:  No  Known Allergies  REVIEW OF SYSTEMS:   ROS As per history of present illness. All pertinent systems were reviewed above. Constitutional, HEENT, cardiovascular, respiratory, GI, GU, musculoskeletal, neuro, psychiatric, endocrine, integumentary and hematologic systems were reviewed and are otherwise negative/unremarkable except for positive findings mentioned above in the HPI.   MEDICATIONS AT HOME:   Prior to Admission medications   Medication Sig Start Date End Date Taking? Authorizing  Provider  atorvastatin (LIPITOR) 40 MG tablet Take 40 mg by mouth daily.   Yes [provider]  glipiZIDE (GLUCOTROL XL) 10 MG 24 hr tablet Take 20 mg by mouth daily with breakfast.   Yes [provider]      VITAL SIGNS:  Blood pressure 140/90, pulse 78, temperature 97.8 F (36.6 C), temperature source Oral, resp. rate 18, height '6\' 3"'$  (1.905 m), weight 97.5 kg, SpO2 99 %.  PHYSICAL EXAMINATION:  Physical Exam  GENERAL:  55 y.o.-year-old male patient lying in the bed with no acute distress.  EYES: Pupils equal, round, reactive to light and accommodation. No scleral icterus. Extraocular muscles intact.  HEENT: Head atraumatic, normocephalic. Oropharynx and nasopharynx clear.  NECK:  Supple, no jugular venous distention. No thyroid enlargement, no tenderness.  LUNGS: Normal breath sounds bilaterally, no wheezing, rales,rhonchi or crepitation. No use of accessory muscles of respiration.  CARDIOVASCULAR: Regular rate and rhythm, S1, S2 normal. No murmurs, rubs, or gallops.  ABDOMEN: Soft, nondistended, nontender. Bowel sounds present. No organomegaly or mass.  EXTREMITIES: No pedal edema. NEUROLOGIC: Cranial nerves II through XII are intact. Muscle strength 5/5 in all extremities. Sensation intact. Gait not checked.  PSYCHIATRIC: The patient is alert and oriented x 3.  Normal affect and good eye contact. SKIN: Plantar foot wound with erythema surrounding and scabbing on the medial side extending to the ankle and slightly swollen plantar side of the second third and fourth digit with ulceration on the lateral side of the foot and yellowish eschar with erythema extending throughout the dorsum of the foot and associated tenderness.      LABORATORY PANEL:   CBC Recent Labs  Lab 05/09/21 1425  WBC 12.8*  HGB 15.0  HCT 42.3  PLT 244   ------------------------------------------------------------------------------------------------------------------  Chemistries   Recent Labs  Lab 05/09/21 1425  NA 130*  K 3.4*  CL 96*  CO2 31  GLUCOSE 157*  BUN 12  CREATININE 0.70  CALCIUM 13.5*  AST 52*  ALT 46*  ALKPHOS 114  BILITOT 1.3*   ------------------------------------------------------------------------------------------------------------------  Cardiac Enzymes No results for input(s): TROPONINI in the last 168 hours. ------------------------------------------------------------------------------------------------------------------  RADIOLOGY:  DG Foot Complete Right  Result Date: 05/09/2021 CLINICAL DATA:  Foot infection EXAM: RIGHT FOOT COMPLETE - 3+ VIEW COMPARISON:  Toe radiograph 07/05/2020, foot MRI 07/09/2020 FINDINGS: Prior great toe amputation. There is a mildly displaced fracture of the distal second metatarsal neck. There is subluxation of the second MTP joint and dislocation of the third MTP joints. Prior fifth ray amputation. 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 Forefoot soft tissue swelling with plantar soft tissue ulcer. IMPRESSION: Mild displaced fracture of the distal second metatarsal neck, which could be pathologic from underlying infection/osteomyelitis. Forefoot soft tissue swelling with plantar soft tissue ulcer. Subluxation/dislocation of the second and third MTP joints. Prior great toe amputation and fifth ray amputations. Electronically Signed   By: Maurine Simmering M.D.   On: 05/09/2021 15:38      IMPRESSION AND PLAN:  Active Problems:   Foot osteomyelitis, right (HCC)  Right  foot purulent cellulitis and osteomyelitis with infected diabetic wound.  The patient has subsequent mild sepsis manifested by leukocytosis of 12.8 with a heart rate of 93. - The patient will be admitted to a medical monitored bed. - We will continue IV antibiotic therapy with vancomycin and Zosyn. - Pain management will be provided. - Podiatry consult was obtained. - Dr. Vickki Muff is aware about the patient and saw  him in the ER and we will plan on likely mid foot amputation in a.m. - The patient will be kept n.p.o. after midnight.  2.  Hypercalcemia. - The patient will be placed on hydration with IV normal salineI will add 20 mg of IV Lasix. - Calcium level will be followed. - Should he continue to be hypercalcemic, will will consider IV biphosphonate therapy.    3.  Type 2 diabetes mellitus. - The patient will be placed on supplement coverage with NovoLog I will continue Glucotrol XL that can be held off when the patient is NPO.  3.  Dyslipidemia. - We will continue statin therapy.  DVT prophylaxis: SCDs.  Medical prophylaxis is postponed till postoperative period.  Code Status: full code.  Family Communication:  The plan of care was discussed in details with the patient (and family). I answered all questions. The patient agreed to proceed with the above mentioned plan. Further management will depend upon hospital course. Disposition Plan: Back to previous home environment Consults called: Podiatry. All the records are reviewed and case discussed with ED provider.  Status is: Inpatient  Remains inpatient appropriate because:Ongoing active pain requiring inpatient pain management, Ongoing diagnostic testing needed not appropriate for outpatient work up, Unsafe d/c plan, IV treatments appropriate due to intensity of illness or inability to take PO, and Inpatient level of care appropriate due to severity of illness  Dispo: The patient is from: Home              Anticipated d/c is to: Home              Patient currently is not medically stable to d/c.   Difficult to place patient No    TOTAL TIME TAKING CARE OF THIS PATIENT: 55 minutes.    Christel Mormon M.D on 05/09/2021 at 8:29 PM  [facility]   From 7 PM-7 AM, contact night-coverage [email or web address]  CC: Primary care physician; Pcp, No

## 2021-05-09 NOTE — ED Notes (Signed)
1 set of blood cultures sent to lab.  

## 2021-05-09 NOTE — Consult Note (Signed)
ORTHOPAEDIC CONSULTATION  REQUESTING PHYSICIAN: Mansy, Arvella Merles, MD  Chief Complaint: Right foot infection  HPI: [name] is a 55 y.o. male who complains of worsening infection in his right foot.  He states sometime this month he*developing a wound on the bottom of his right foot.  Longstanding history of diabetes.  He has previously undergone great toe amputation and fifth ray amputation on this right foot.  He states of recent has not felt well.  He has had fever and chills.  Longstanding history of diabetes with neuropathy  Past Medical History:  Diagnosis Date   Diabetes ([place])    Hypertension    Past Surgical History:  Procedure Laterality Date   AMPUTATION TOE Right 07/11/2020   Procedure: AMPUTATION TOE-Right Great Toe;  Surgeon: Samara Deist, DPM;  Location: [facility];  Service: Podiatry;  Laterality: Right;   IRRIGATION AND DEBRIDEMENT ABSCESS Right 10/31/2018   Procedure: IRRIGATION AND DEBRIDEMENT ABSCESS;  Surgeon: Samara Deist, DPM;  Location: [facility];  Service: Podiatry;  Laterality: Right;   IRRIGATION AND DEBRIDEMENT FOOT Right 11/02/2018   Procedure: IRRIGATION AND DEBRIDEMENT FOOT;  Surgeon: Sharlotte Alamo, DPM;  Location: [facility];  Service: Podiatry;  Laterality: Right;   Social History   Socioeconomic History   Marital status: Single    Spouse name: Not on file   Number of children: Not on file   Years of education: Not on file   Highest education level: Not on file  Occupational History   Not on file  Tobacco Use   Smoking status: Light Smoker    Types: Cigars   Smokeless tobacco: Never  Vaping Use   Vaping Use: Never used  Substance and Sexual Activity   Alcohol use: Yes    Comment: "too much"    Drug use: Never   Sexual activity: Yes  Other Topics Concern   Not on file  Social History Narrative   Not on file   Social Determinants of Health   Financial Resource Strain: Not on file  Food Insecurity: Not on file  Transportation Needs: Not on  file  Physical Activity: Not on file  Stress: Not on file  Social Connections: Not on file   Family History  Problem Relation Age of Onset   CAD Father    CAD Brother    No Known Allergies Prior to Admission medications   Medication Sig Start Date End Date Taking? Authorizing Provider  atorvastatin (LIPITOR) 40 MG tablet Take 40 mg by mouth daily.   Yes [provider]  glipiZIDE (GLUCOTROL XL) 10 MG 24 hr tablet Take 20 mg by mouth daily with breakfast.   Yes [provider]   DG Foot Complete Right  Result Date: 05/09/2021 CLINICAL DATA:  Foot infection EXAM: RIGHT FOOT COMPLETE - 3+ VIEW COMPARISON:  Toe radiograph 07/05/2020, foot MRI 07/09/2020 FINDINGS: Prior great toe amputation. There is a mildly displaced fracture of the distal second metatarsal neck. There is subluxation of the second MTP joint and dislocation of the third MTP joints. Prior fifth ray amputation. 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 Forefoot soft tissue swelling with plantar soft tissue ulcer. IMPRESSION: Mild displaced fracture of the distal second metatarsal neck, which could be pathologic from underlying infection/osteomyelitis. Forefoot soft tissue swelling with plantar soft tissue ulcer. Subluxation/dislocation of the second and third MTP joints. Prior great toe amputation and fifth ray amputations. Electronically Signed   By: Maurine Simmering M.D.   On: 05/09/2021 15:38  Positive ROS: All other systems have been reviewed and were otherwise negative with the exception of those mentioned in the HPI and as above.  12 point ROS was performed.  Physical Exam: General: Alert and oriented.  No apparent distress.  Vascular:  Left foot:Dorsalis Pedis:  present Posterior Tibial:  diminished  Right foot: Dorsalis Pedis:  diminished Posterior Tibial:  diminished  Neuro:absent protective sensation  Derm: Left foot without ulceration.  Right foot with noted  severe erythema.  Purulent drainage from from a large plantar wound to the right forefoot.  Necrotic tissue in the central aspect.  Severe cellulitis.  Ulcer to the lateral fourth metatarsal head as well.  Ortho/MS: He status post right fifth ray amputation and great toe amputation.  Personally reviewed the x-rays myself that shows the amputation of the great toe and fifth ray.  Obvious erosive change of the second metatarsal head has occurred with complete loss of normal construct.  Dislocated third MTPJ with diffuse edema surrounding this area likely consistent with osteomyelitis throughout this area as well.  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  May need to involve vascular surgery afterwards.  Will definitely need IV antibiotics going forward.  I discussed the surgery with the patient in detail today.  I discussed this may be a staged procedure given the amount of infection at this time.  I discussed further risk benefits alternatives and complications of surgery.  We will plan for surgery in the morning.  Orders will be placed for n.p.o. after midnight tonight.    Elesa Hacker, DPM Cell [phone]   05/09/2021 8:45 PM

## 2021-05-09 NOTE — ED Triage Notes (Signed)
Pt here with a right foot wound. Wound had a strong odor and is draining pus. Foot is red and warm to touch. Pt was attempting to treat infection at home.

## 2021-05-09 NOTE — ED Provider Notes (Signed)
[facility] Emergency Department Provider Note  ____________________________________________   Event Date/Time   First MD Initiated Contact with Patient 05/09/21 1731     (approximate)  I have reviewed the triage vital signs and the nursing notes.   HISTORY  Chief Complaint Wound Infection    HPI [name] is a 55 y.o. male with past medical history of hypertension, diabetes, here with foot wound.  The patient states that he has had some chronic issues with wounds and osteomyelitis of his right foot.  He has had his great toe and fifth toe amputated.  He reports that over the last several weeks, he has noticed a wound initially over the plantar aspect of his distal foot, and has now developed a wound over the lateral aspect.  He has noticed foul smelling drainage.  He has had increasing pain which he describes as aching, throbbing, 10 out of 10 with weightbearing.  He has had associated drainage.  He has had a friend who is a retired [employer] scientist who has been trying to care for the wound, but he feels like the redness and pain is worsened.  He subsequent presents for evaluation.  No specific alleviating factors.    Past Medical History:  Diagnosis Date   Diabetes [facility])    Hypertension     Patient Active Problem List   Diagnosis Date Noted   Foot osteomyelitis, right ([place]) 05/09/2021   Hypercalcemia 07/10/2020   Type 2 diabetes mellitus with complication, without long-term current use of insulin ([place]) 07/09/2020   HTN (hypertension) 07/09/2020   Osteomyelitis of great toe of right foot ([place]) 10/30/2018   Dog bite 04/13/2018    Past Surgical History:  Procedure Laterality Date   AMPUTATION TOE Right 07/11/2020   Procedure: AMPUTATION TOE-Right Great Toe;  Surgeon: Samara Deist, DPM;  Location: [facility] ORS;  Service: Podiatry;  Laterality: Right;   IRRIGATION AND DEBRIDEMENT ABSCESS Right 10/31/2018   Procedure: IRRIGATION AND DEBRIDEMENT ABSCESS;  Surgeon:  Samara Deist, DPM;  Location: [facility] ORS;  Service: Podiatry;  Laterality: Right;   IRRIGATION AND DEBRIDEMENT FOOT Right 11/02/2018   Procedure: IRRIGATION AND DEBRIDEMENT FOOT;  Surgeon: Sharlotte Alamo, DPM;  Location: [facility] ORS;  Service: Podiatry;  Laterality: Right;    Prior to Admission medications   Medication Sig Start Date End Date Taking? Authorizing Provider  atorvastatin (LIPITOR) 40 MG tablet Take 40 mg by mouth daily.   Yes [provider]  glipiZIDE (GLUCOTROL XL) 10 MG 24 hr tablet Take 20 mg by mouth daily with breakfast.   Yes [provider]    Allergies Patient has no known allergies.  Family History  Problem Relation Age of Onset   CAD Father    CAD Brother     Social History Social History   Tobacco Use   Smoking status: Light Smoker    Types: Cigars   Smokeless tobacco: Never  Vaping Use   Vaping Use: Never used  Substance Use Topics   Alcohol use: Yes    Comment: "too much"    Drug use: Never    Review of Systems  Review of Systems  Constitutional:  Positive for chills and fatigue. Negative for fever.  HENT:  Negative for sore throat.   Respiratory:  Negative for shortness of breath.   Cardiovascular:  Negative for chest pain.  Gastrointestinal:  Negative for abdominal pain.  Genitourinary:  Negative for flank pain.  Musculoskeletal:  Positive for gait problem (Due to pain). Negative for neck  pain.  Skin:  Positive for wound. Negative for rash.  Allergic/Immunologic: Negative for immunocompromised state.  Neurological:  Negative for weakness and numbness.  Hematological:  Does not bruise/bleed easily.    ____________________________________________  PHYSICAL EXAM:      VITAL SIGNS: ED Triage Vitals  Enc Vitals Group     BP 05/09/21 1415 (!) 121/93     Pulse Rate 05/09/21 1415 93     Resp 05/09/21 1415 18     Temp 05/09/21 1415 97.8 F (36.6 C)     Temp Source 05/09/21 1415 Oral     SpO2 05/09/21 1415 99 %     Weight  05/09/21 1416 215 lb (97.5 kg)     Height 05/09/21 1416 '6\' 3"'$  (1.905 m)     Head Circumference --      Peak Flow --      Pain Score 05/09/21 1416 10     Pain Loc --      Pain Edu? --      Excl. in [place]? --      Physical Exam Vitals and nursing note reviewed.  Constitutional:      General: He is not in acute distress.    Appearance: He is well-developed.  HENT:     Head: Normocephalic and atraumatic.  Eyes:     Conjunctiva/sclera: Conjunctivae normal.  Cardiovascular:     Rate and Rhythm: Normal rate and regular rhythm.     Heart sounds: Normal heart sounds. No murmur heard.   No friction rub.  Pulmonary:     Effort: Pulmonary effort is normal. No respiratory distress.     Breath sounds: Normal breath sounds. No wheezing or rales.  Abdominal:     General: There is no distension.     Palpations: Abdomen is soft.     Tenderness: There is no abdominal tenderness.  Musculoskeletal:     Cervical back: Neck supple.  Skin:    General: Skin is warm.     Capillary Refill: Capillary refill takes less than 2 seconds.  Neurological:     Mental Status: He is alert and oriented to person, place, and time.     Motor: No abnormal muscle tone.    LOWER EXTREMITY EXAM: RIGHT  INSPECTION & PALPATION: Open, draining, foul smelling wound along plantar aspect of foot overlying 2-3rd MTP joints, with surrounding erythema to the mid foot.   VASCULAR: 1+ dorsalis pedis and posterior tibialis pulses Capillary refill < 2 sec, toes warm and well-perfused       ____________________________________________   LABS (all labs ordered are listed, but only abnormal results are displayed)  Labs Reviewed  COMPREHENSIVE METABOLIC PANEL - Abnormal; Notable for the following components:      Result Value   Sodium 130 (*)    Potassium 3.4 (*)    Chloride 96 (*)    Glucose, Bld 157 (*)    Calcium 13.5 (*)    Albumin 2.9 (*)    AST 52 (*)    ALT 46 (*)    Total Bilirubin 1.3 (*)    Anion gap  3 (*)    All other components within normal limits  CBC WITH DIFFERENTIAL/PLATELET - Abnormal; Notable for the following components:   WBC 12.8 (*)    Neutro Abs 10.3 (*)    Abs Immature Granulocytes 0.08 (*)    All other components within normal limits  URINALYSIS, COMPLETE (UACMP) WITH MICROSCOPIC - Abnormal; Notable for the following components:   Color, Urine YELLOW (*)  APPearance CLEAR (*)    All other components within normal limits  CULTURE, BLOOD (ROUTINE X 2)  CULTURE, BLOOD (ROUTINE X 2)  LACTIC ACID, PLASMA  PROTIME-INR  SEDIMENTATION RATE  C-REACTIVE PROTEIN    ____________________________________________  EKG:  ________________________________________  RADIOLOGY All imaging, including plain films, CT scans, and ultrasounds, independently reviewed by me, and interpretations confirmed via formal radiology reads.  ED MD interpretation:   DG foot right: Displaced fracture of the second metatarsal neck concerning for osteo-  Official radiology report(s): DG Foot Complete Right  Result Date: 05/09/2021 CLINICAL DATA:  Foot infection EXAM: RIGHT FOOT COMPLETE - 3+ VIEW COMPARISON:  Toe radiograph 07/05/2020, foot MRI 07/09/2020 FINDINGS: Prior great toe amputation. There is a mildly displaced fracture of the distal second metatarsal neck. There is subluxation of the second MTP joint and dislocation of the third MTP joints. Prior fifth ray amputation. 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 Forefoot soft tissue swelling with plantar soft tissue ulcer. IMPRESSION: Mild displaced fracture of the distal second metatarsal neck, which could be pathologic from underlying infection/osteomyelitis. Forefoot soft tissue swelling with plantar soft tissue ulcer. Subluxation/dislocation of the second and third MTP joints. Prior great toe amputation and fifth ray amputations. Electronically Signed   By: Maurine Simmering M.D.   On: 05/09/2021 15:38     ____________________________________________  PROCEDURES   Procedure(s) performed (including Critical Care):  Procedures  ____________________________________________  INITIAL IMPRESSION / MDM / [place] / ED COURSE  As part of my medical decision making, I reviewed the following data within the [place] notes reviewed and incorporated, Old chart reviewed, Notes from prior ED visits, and [place] Controlled Substance Database       *[name] was evaluated in Emergency Department on 05/09/2021 for the symptoms described in the history of present illness. He was evaluated in the context of the global COVID-19 pandemic, which necessitated consideration that the patient might be at risk for infection with the SARS-CoV-2 virus that causes COVID-19. Institutional protocols and algorithms that pertain to the evaluation of patients at risk for COVID-19 are in a state of rapid change based on information released by regulatory bodies including the CDC and federal and state organizations. These policies and algorithms were followed during the patient's care in the ED.  Some ED evaluations and interventions may be delayed as a result of limited staffing during the pandemic.*     Medical Decision Making: 55 year old male here with foul-smelling, draining wound to the right foot.  Patient has moderate leukocytosis but is otherwise afebrile, with no signs of severe sepsis.  Lactic acid is normal.  CMP shows hypercalcemia interestingly.  Will give fluids.  This can be related to dehydration but may need further work-up as an inpatient.  Blood cultures have been sent.  Plain films of the foot obtained, are concerning for underlying osteomyelitis.  Discussed with Dr. Vickki Muff of podiatry.  Will admit for antibiotics and likely surgical intervention.  He recommends MRI without contrast.  ____________________________________________  FINAL CLINICAL IMPRESSION(S) / ED  DIAGNOSES  Final diagnoses:  Acute hematogenous osteomyelitis of right foot ([place])     MEDICATIONS GIVEN DURING THIS VISIT:  Medications  vancomycin (VANCOREADY) IVPB 2000 mg/400 mL (2,000 mg Intravenous New Bag/Given 05/09/21 1854)  HYDROmorphone (DILAUDID) injection 1 mg (has no administration in time range)  ondansetron (ZOFRAN) injection 4 mg (has no administration in time range)  0.9 %  sodium chloride infusion ( Intravenous Stopped 05/09/21  1843)  piperacillin-tazobactam (ZOSYN) IVPB 3.375 g (0 g Intravenous Stopped 05/09/21 1843)  0.9 %  sodium chloride infusion ( Intravenous New Bag/Given 05/09/21 1853)     ED Discharge Orders     None        Note:  This document was prepared using Dragon voice recognition software and may include unintentional dictation errors.   Duffy Bruce, MD 05/09/21 1940

## 2021-05-10 ENCOUNTER — Encounter: Payer: Self-pay | Admitting: Family Medicine

## 2021-05-10 ENCOUNTER — Inpatient Hospital Stay: Payer: Self-pay | Admitting: Anesthesiology

## 2021-05-10 ENCOUNTER — Inpatient Hospital Stay: Payer: Self-pay

## 2021-05-10 ENCOUNTER — Encounter
Admission: EM | Disposition: A | Discharge status: Home Health | Payer: Self-pay | Source: Home / Self Care | Attending: Internal Medicine

## 2021-05-10 HISTORY — PX: TRANSMETATARSAL AMPUTATION: SHX6197

## 2021-05-10 LAB — PROTIME-INR
INR: 1.2 (ref 0.8–1.2)
Prothrombin Time: 15.5 seconds — ABNORMAL HIGH (ref 11.4–15.2)

## 2021-05-10 LAB — BASIC METABOLIC PANEL
Anion gap: 5 (ref 5–15)
BUN: 9 mg/dL (ref 6–20)
CO2: 27 mmol/L (ref 22–32)
Calcium: 12.8 mg/dL — ABNORMAL HIGH (ref 8.9–10.3)
Chloride: 102 mmol/L (ref 98–111)
Creatinine, Ser: 0.66 mg/dL (ref 0.61–1.24)
GFR, Estimated: >60 mL/min (ref >60)
Glucose, Bld: 138 mg/dL — ABNORMAL HIGH (ref 70–99)
Potassium: 3.1 mmol/L — ABNORMAL LOW (ref 3.5–5.1)
Sodium: 134 mmol/L — ABNORMAL LOW (ref 135–145)

## 2021-05-10 LAB — CBC
HCT: 35.6 % — ABNORMAL LOW (ref 39.0–52.0)
Hemoglobin: 12.7 g/dL — ABNORMAL LOW (ref 13.0–17.0)
MCH: 33.7 pg (ref 26.0–34.0)
MCHC: 35.7 g/dL (ref 30.0–36.0)
MCV: 94.4 fL (ref 80.0–100.0)
Platelets: 222 10*3/uL (ref 150–400)
RBC: 3.77 MIL/uL — ABNORMAL LOW (ref 4.22–5.81)
RDW: 11.9 % (ref 11.5–15.5)
WBC: 9.3 10*3/uL (ref 4.0–10.5)
nRBC: 0 % (ref 0.0–0.2)

## 2021-05-10 LAB — SARS CORONAVIRUS 2 (TAT 6-24 HRS): SARS Coronavirus 2: NEGATIVE

## 2021-05-10 LAB — GLUCOSE, CAPILLARY
Glucose-Capillary: 133 mg/dL — ABNORMAL HIGH (ref 70–99)
Glucose-Capillary: 144 mg/dL — ABNORMAL HIGH (ref 70–99)
Glucose-Capillary: 149 mg/dL — ABNORMAL HIGH (ref 70–99)
Glucose-Capillary: 151 mg/dL — ABNORMAL HIGH (ref 70–99)
Glucose-Capillary: 179 mg/dL — ABNORMAL HIGH (ref 70–99)

## 2021-05-10 LAB — HEMOGLOBIN A1C
Hgb A1c MFr Bld: 6.4 % — ABNORMAL HIGH (ref 4.8–5.6)
Mean Plasma Glucose: 136.98 mg/dL

## 2021-05-10 LAB — SURGICAL PCR SCREEN
MRSA, PCR: NEGATIVE
Staphylococcus aureus: NEGATIVE

## 2021-05-10 LAB — C-REACTIVE PROTEIN: CRP: 6.6 mg/dL — ABNORMAL HIGH (ref <1.0)

## 2021-05-10 LAB — PROCALCITONIN: Procalcitonin: 3.18 ng/mL

## 2021-05-10 LAB — CORTISOL-AM, BLOOD: Cortisol - AM: 16.6 ug/dL (ref 6.7–22.6)

## 2021-05-10 SURGERY — AMPUTATION, FOOT, TRANSMETATARSAL
Anesthesia: General | Site: Toe | Laterality: Right

## 2021-05-10 MED ORDER — POVIDONE-IODINE 10 % EX SWAB: 2.0000 "application " | Freq: Once | CUTANEOUS | Status: DC

## 2021-05-10 MED ORDER — MIDAZOLAM HCL 2 MG/2ML IJ SOLN
INTRAMUSCULAR | Status: DC | PRN
  Administered 2021-05-10: 2 mg via INTRAVENOUS

## 2021-05-10 MED ORDER — ONDANSETRON HCL 4 MG/2ML IJ SOLN
INTRAMUSCULAR | Status: AC
  Filled 2021-05-10: qty 2

## 2021-05-10 MED ORDER — MUPIROCIN 2 % EX OINT
1.0000 "application " | TOPICAL_OINTMENT | Freq: Two times a day (BID) | CUTANEOUS | Status: AC
  Administered 2021-05-10 – 2021-05-14 (×10): 1 via NASAL
  Filled 2021-05-10: qty 22

## 2021-05-10 MED ORDER — EPHEDRINE SULFATE 50 MG/ML IJ SOLN
INTRAMUSCULAR | Status: DC | PRN
  Administered 2021-05-10: 10 mg via INTRAVENOUS
  Administered 2021-05-10 (×3): 5 mg via INTRAVENOUS

## 2021-05-10 MED ORDER — SODIUM CHLORIDE 0.9 % IV SOLN: INTRAVENOUS | Status: DC | PRN

## 2021-05-10 MED ORDER — VANCOMYCIN HCL 1000 MG IV SOLR
INTRAVENOUS | Status: DC | PRN
  Administered 2021-05-10: 1000 mg

## 2021-05-10 MED ORDER — ONDANSETRON HCL 4 MG/2ML IJ SOLN: 4.0000 mg | Freq: Once | INTRAMUSCULAR | Status: DC | PRN

## 2021-05-10 MED ORDER — FENTANYL CITRATE (PF) 100 MCG/2ML IJ SOLN
INTRAMUSCULAR | Status: DC | PRN
  Administered 2021-05-10 (×2): 50 ug via INTRAVENOUS

## 2021-05-10 MED ORDER — BUPIVACAINE HCL (PF) 0.5 % IJ SOLN
INTRAMUSCULAR | Status: DC | PRN
  Administered 2021-05-10: 20 mL

## 2021-05-10 MED ORDER — FENTANYL CITRATE (PF) 100 MCG/2ML IJ SOLN
INTRAMUSCULAR | Status: AC
  Administered 2021-05-10: 25 ug via INTRAVENOUS
  Filled 2021-05-10: qty 2

## 2021-05-10 MED ORDER — EPHEDRINE 5 MG/ML INJ
INTRAVENOUS | Status: AC
  Filled 2021-05-10: qty 5

## 2021-05-10 MED ORDER — PROPOFOL 10 MG/ML IV BOLUS
INTRAVENOUS | Status: DC | PRN
  Administered 2021-05-10: 150 mg via INTRAVENOUS

## 2021-05-10 MED ORDER — 0.9 % SODIUM CHLORIDE (POUR BTL) OPTIME
TOPICAL | Status: DC | PRN
  Administered 2021-05-10: 1000 mL

## 2021-05-10 MED ORDER — VANCOMYCIN HCL 2000 MG/400ML IV SOLN
2000.0000 mg | Freq: Two times a day (BID) | INTRAVENOUS | Status: DC
  Administered 2021-05-10 – 2021-05-12 (×5): 2000 mg via INTRAVENOUS
  Filled 2021-05-10 (×7): qty 400

## 2021-05-10 MED ORDER — CHLORHEXIDINE GLUCONATE 4 % EX LIQD
60.0000 mL | Freq: Once | CUTANEOUS | Status: AC
  Administered 2021-05-10: 4 via TOPICAL

## 2021-05-10 MED ORDER — FENTANYL CITRATE (PF) 100 MCG/2ML IJ SOLN
25.0000 ug | INTRAMUSCULAR | Status: DC | PRN
  Administered 2021-05-10: 25 ug via INTRAVENOUS

## 2021-05-10 MED ORDER — BUPIVACAINE HCL (PF) 0.5 % IJ SOLN
INTRAMUSCULAR | Status: AC
  Filled 2021-05-10: qty 30

## 2021-05-10 MED ORDER — SODIUM CHLORIDE 0.9 % IR SOLN
Status: DC | PRN
  Administered 2021-05-10: 1000 mL
  Administered 2021-05-10: 3000 mL

## 2021-05-10 MED ORDER — POTASSIUM CHLORIDE CRYS ER 20 MEQ PO TBCR
40.0000 meq | EXTENDED_RELEASE_TABLET | Freq: Once | ORAL | Status: AC
  Administered 2021-05-10: 40 meq via ORAL
  Filled 2021-05-10: qty 2

## 2021-05-10 MED ORDER — LIDOCAINE HCL (CARDIAC) PF 100 MG/5ML IV SOSY
PREFILLED_SYRINGE | INTRAVENOUS | Status: DC | PRN
  Administered 2021-05-10: 100 mg via INTRAVENOUS

## 2021-05-10 MED ORDER — FENTANYL CITRATE (PF) 100 MCG/2ML IJ SOLN
INTRAMUSCULAR | Status: AC
  Filled 2021-05-10: qty 2

## 2021-05-10 MED ORDER — INSULIN ASPART 100 UNIT/ML IJ SOLN
0.0000 [IU] | INTRAMUSCULAR | Status: DC
  Administered 2021-05-10: 2 [IU] via SUBCUTANEOUS
  Administered 2021-05-10 (×2): 3 [IU] via SUBCUTANEOUS
  Filled 2021-05-10 (×3): qty 1

## 2021-05-10 MED ORDER — MIDAZOLAM HCL 2 MG/2ML IJ SOLN
INTRAMUSCULAR | Status: AC
  Filled 2021-05-10: qty 2

## 2021-05-10 MED ORDER — LIDOCAINE HCL (PF) 2 % IJ SOLN
INTRAMUSCULAR | Status: AC
  Filled 2021-05-10: qty 5

## 2021-05-10 MED ORDER — ONDANSETRON HCL 4 MG/2ML IJ SOLN
INTRAMUSCULAR | Status: DC | PRN
  Administered 2021-05-10: 4 mg via INTRAVENOUS

## 2021-05-10 MED ORDER — VANCOMYCIN HCL 1000 MG IV SOLR
INTRAVENOUS | Status: AC
  Filled 2021-05-10: qty 1000

## 2021-05-10 SURGICAL SUPPLY — 45 items
BAG COUNTER SPONGE SURGICOUNT (BAG) IMPLANT
BLADE 12 ID 8INL TREPHINE (BLADE) ×2 IMPLANT
BLADE MED AGGRESSIVE (BLADE) ×2 IMPLANT
BLADE SURG 10 STRL SS (BLADE) ×4 IMPLANT
BNDG COHESIVE 4X5 TAN ST LF (GAUZE/BANDAGES/DRESSINGS) ×2 IMPLANT
BNDG ELASTIC 4X5.8 VLCR NS LF (GAUZE/BANDAGES/DRESSINGS) ×2 IMPLANT
BNDG ESMARK 4X12 TAN STRL LF (GAUZE/BANDAGES/DRESSINGS) ×2 IMPLANT
BNDG GAUZE ELAST 4 BULKY (GAUZE/BANDAGES/DRESSINGS) ×2 IMPLANT
BNDG STRETCH 4X75 STRL LF (GAUZE/BANDAGES/DRESSINGS) ×2 IMPLANT
CANISTER SUCT 1200ML W/VALVE (MISCELLANEOUS) ×2 IMPLANT
CNTNR SPEC 2.5X3XGRAD LEK (MISCELLANEOUS) ×1
CONT SPEC 4OZ STER OR WHT (MISCELLANEOUS) ×1
CONTAINER SPEC 2.5X3XGRAD LEK (MISCELLANEOUS) ×1 IMPLANT
CUFF TOURN SGL QUICK 12 (TOURNIQUET CUFF) ×2 IMPLANT
CUFF TOURN SGL QUICK 18X4 (TOURNIQUET CUFF) ×2 IMPLANT
DRAIN PENROSE 12X.25 LTX STRL (MISCELLANEOUS) IMPLANT
DURAPREP 26ML APPLICATOR (WOUND CARE) ×2 IMPLANT
ELECT REM PT RETURN 9FT ADLT (ELECTROSURGICAL) ×2
ELECTRODE REM PT RTRN 9FT ADLT (ELECTROSURGICAL) ×1 IMPLANT
GAUZE 4X4 16PLY ~~LOC~~+RFID DBL (SPONGE) ×2 IMPLANT
GAUZE SPONGE 4X4 12PLY STRL (GAUZE/BANDAGES/DRESSINGS) ×4 IMPLANT
GAUZE XEROFORM 1X8 LF (GAUZE/BANDAGES/DRESSINGS) ×2 IMPLANT
GLOVE SURG ENC MOIS LTX SZ7.5 (GLOVE) ×2 IMPLANT
GLOVE SURG UNDER LTX SZ8 (GLOVE) ×2 IMPLANT
GOWN STRL REUS W/ TWL XL LVL3 (GOWN DISPOSABLE) ×2 IMPLANT
GOWN STRL REUS W/TWL XL LVL3 (GOWN DISPOSABLE) ×2
HANDLE YANKAUER SUCT BULB TIP (MISCELLANEOUS) IMPLANT
HANDPIECE VERSAJET DEBRIDEMENT (MISCELLANEOUS) ×2 IMPLANT
KIT STIMULAN RAPID CURE 5CC (Orthopedic Implant) ×2 IMPLANT
KIT TURNOVER KIT A (KITS) ×2 IMPLANT
LABEL OR SOLS (LABEL) IMPLANT
MANIFOLD NEPTUNE II (INSTRUMENTS) ×2 IMPLANT
NDL SAFETY ECLIPSE 18X1.5 (NEEDLE) ×1 IMPLANT
NEEDLE FILTER BLUNT 18X 1/2SAF (NEEDLE) ×1
NEEDLE FILTER BLUNT 18X1 1/2 (NEEDLE) ×1 IMPLANT
NEEDLE HYPO 18GX1.5 SHARP (NEEDLE) ×1
NS IRRIG 500ML POUR BTL (IV SOLUTION) ×2 IMPLANT
PACK EXTREMITY ARMC (MISCELLANEOUS) ×2 IMPLANT
PAD ABD DERMACEA PRESS 5X9 (GAUZE/BANDAGES/DRESSINGS) ×4 IMPLANT
SOL PREP PVP 2OZ (MISCELLANEOUS) ×2
SOLUTION PREP PVP 2OZ (MISCELLANEOUS) ×1 IMPLANT
SPONGE T-LAP 18X18 ~~LOC~~+RFID (SPONGE) ×2 IMPLANT
STOCKINETTE M/LG 89821 (MISCELLANEOUS) ×2 IMPLANT
SYR 10ML LL (SYRINGE) ×4 IMPLANT
TIP BRUSH PULSAVAC PLUS 24.33 (MISCELLANEOUS) ×2 IMPLANT

## 2021-05-10 NOTE — Op Note (Signed)
Operative note   Surgeon:Nylee Barbuto    Assistant:None    Preop diagnosis: Diabetic foot infection right forefoot    Postop diagnosis: Same    Procedure: Transmetatarsal amputation right forefoot    EBL: 20 mL    Anesthesia:local and general.  Local consisted of a total of 20 cc of 0.5% bupivacaine    Hemostasis: Mid calf tourniquet inflated to 200 mmHg for approximately 20 minutes    Specimen: Transmetatarsal amputation for pathology as well as bone for culture    Complications: None    Operative indications:[name] is an 55 y.o. that presents today for surgical intervention.  The risks/benefits/alternatives/complications have been discussed and consent has been given.    Procedure:  Patient was brought into the OR and placed on the operating table in thesupine position. After anesthesia was obtained theright lower extremity was prepped and draped in usual sterile fashion.  Attention was directed to the right foot where full-thickness flaps were created dorsal and plantar along the midfoot region.  A large ulcer was noted to the plantar forefoot around the second and third metatarsal head.  This was excised as well.  Full-thickness flaps were created to the level of the midfoot.  Osteotomies were created to metatarsals 1 through 4.  A previous fifth metatarsal fifth ray amputation had previously been performed at this time the distal tissue was then removed from the surgical field.  A portion of obviously infected bone was sent for culture.  The proximal margins were then inked.  The wound was flushed initially with a pulse lavage.  A Versajet was used to remove all of the nonviable and infected tissue along all areas.  The tourniquet was released and all bleeders were Bovie cauterized.  Closure was performed with a 2-0 nylon for the skin.  This was packed open and the central distal flap at the excised ulceration.  A large bulky sterile dressing was applied.    Patient tolerated  the procedure and anesthesia well.  Was transported from the OR to the PACU with all vital signs stable and vascular status intact. To be discharged per routine protocol.  Will follow up in approximately 1 week in the outpatient clinic.

## 2021-05-10 NOTE — Transfer of Care (Signed)
Immediate Anesthesia Transfer of Care Note  Patient: [name]  Procedure(s) Performed: TRANSMETATARSAL AMPUTATION (Right: Toe)  Patient Location: PACU  Anesthesia Type:General  Level of Consciousness: sedated  Airway & Oxygen Therapy: Patient Spontanous Breathing and Patient connected to face mask oxygen  Post-op Assessment: Report given to RN and Post -op Vital signs reviewed and stable  Post vital signs: stable  Last Vitals:  Vitals Value Taken Time  BP 103/70 05/10/21 1155  Temp    Pulse 73 05/10/21 1158  Resp 18 05/10/21 1158  SpO2 100 % 05/10/21 1158  Vitals shown include unvalidated device data.  Last Pain:  Vitals:   05/10/21 0948  TempSrc: Oral  PainSc: 0-No pain         Complications: No notable events documented.

## 2021-05-10 NOTE — Plan of Care (Signed)
Patient sleeping between care. Oriented x4. Reports pain to right foot, but no request for pain medications. Plan of care reviewed with patient. NPO for surgery today. Call bell within reach.   PLAN OF CARE ONGOING Problem: Education: Goal: Knowledge of General Education information will improve Description: Including pain rating scale, medication(s)/side effects and non-pharmacologic comfort measures Outcome: Progressing   Problem: Health Behavior/Discharge Planning: Goal: Ability to manage health-related needs will improve Outcome: Progressing   Problem: Clinical Measurements: Goal: Will remain free from infection Outcome: Progressing Goal: Diagnostic test results will improve Outcome: Progressing   Problem: Activity: Goal: Risk for activity intolerance will decrease Outcome: Progressing   Problem: Pain Managment: Goal: General experience of comfort will improve Outcome: Progressing   Problem: Safety: Goal: Ability to remain free from injury will improve Outcome: Progressing   Problem: Skin Integrity: Goal: Risk for impaired skin integrity will decrease Outcome: Progressing   Problem: Clinical Measurements: Goal: Ability to avoid or minimize complications of infection will improve Outcome: Progressing   Problem: Skin Integrity: Goal: Skin integrity will improve Outcome: Progressing

## 2021-05-10 NOTE — Anesthesia Preprocedure Evaluation (Signed)
Anesthesia Evaluation  Patient identified by MRN, date of birth, ID band Patient awake    Reviewed: Allergy & Precautions, H&P , NPO status , Patient's Chart, lab work & pertinent test results, reviewed documented beta blocker date and time   Airway Mallampati: II  TM Distance: >3 FB Neck ROM: full    Dental  (+) Teeth Intact   Pulmonary neg pulmonary ROS, Current Smoker and Patient abstained from smoking.,    Pulmonary exam normal        Cardiovascular Exercise Tolerance: Poor hypertension, On Medications negative cardio ROS Normal cardiovascular exam Rate:Normal     Neuro/Psych negative neurological ROS  negative psych ROS   GI/Hepatic negative GI ROS, Neg liver ROS,   Endo/Other  negative endocrine ROSdiabetes, Poorly Controlled, Type 2, Oral Hypoglycemic Agents  Renal/GU negative Renal ROS  negative genitourinary   Musculoskeletal   Abdominal   Peds  Hematology negative hematology ROS (+)   Anesthesia Other Findings   Reproductive/Obstetrics negative OB ROS                             Anesthesia Physical Anesthesia Plan  ASA: 3  Anesthesia Plan: General LMA   Post-op Pain Management:    Induction:   PONV Risk Score and Plan:   Airway Management Planned:   Additional Equipment:   Intra-op Plan:   Post-operative Plan:   Informed Consent: I have reviewed the patients History and Physical, chart, labs and discussed the procedure including the risks, benefits and alternatives for the proposed anesthesia with the patient or authorized representative who has indicated his/her understanding and acceptance.       Plan Discussed with: CRNA  Anesthesia Plan Comments:         Anesthesia Quick Evaluation

## 2021-05-10 NOTE — Progress Notes (Signed)
PROGRESS NOTE    [name]  ZOX:[id number] DOB: October 23, 1965 DOA: 05/09/2021 PCP: Pcp, No    Brief Narrative:  55 y.o. Caucasian male with medical history significant for type 2 diabetes mellitus, hypertension and dyslipidemia, who presented to the emergency room with acute onset of infected right foot wound.  Due to previous history of right foot wound and osteomyelitis he had a great toe and fifth toe amputated.  Swelling has been extending to his whole foot.  His wound was initially over the plantar aspect of his distal foot and later he developed a wound over the lateral aspect.  He now notices foul-smelling drainage and increasing pain that is becoming throbbing in graded 10/10 in severity, worsening with weightbearing.  The redness and pain has been significantly worsening despite wound care that was being provided by his friend who is a retired [employer] scientist.   Patient seen in consultation by podiatry.  MRI pursued with significant osteomyelitis and tissue loss.  Plan for or with podiatry today for at least a transmetatarsal amputation.   Assessment & Plan:   Active Problems:   Foot osteomyelitis, right (HCC)  Right foot osteomyelitis with associated cellulitis Infected diabetic foot ulcer Sepsis secondary to above Sepsis criteria met with leukocytosis, tachycardia Seen in consultation by podiatry, OR planned 8/12 Plan: N.p.o. for the OR Continue broad-spectrum antibiotics, vancomycin and Zosyn Monitor blood cultures and wound cultures Pain control as needed  Hypercalcemia Unclear etiology, possibly from bone breakdown Improved after hydration normal saline and 1 dose Lasix Plan: Monitor calcium levels Consider IV bisphosphonates  Type 2 diabetes mellitus Home sulfonylurea has been held Sliding scale coverage for now, every 4 hours  Hyperlipidemia PTA statin   DVT prophylaxis: SCDs Code Status: Full Family Communication: None today Disposition Plan: Status is:  Inpatient  Remains inpatient appropriate because:Inpatient level of care appropriate due to severity of illness  Dispo: The patient is from: Home              Anticipated d/c is to:  TBD              Patient currently is not medically stable to d/c.   Difficult to place patient No  OR today with podiatry     Level of care: Med-Surg  Consultants:  Podiatry  Procedures:  Transmetatarsal amputation 8/12  Antimicrobials:   IV vancomycin Zosyn   Subjective: Seen and examined.  Sitting comfortably in bed.  Mild pain in right foot otherwise no complaints.  Objective: Vitals:   05/10/21 0011 05/10/21 0539 05/10/21 0732 05/10/21 0948  BP: 133/82 131/78 129/82 126/77  Pulse: 75 73 74 74  Resp: '16  18 14  ' Temp: 97.6 F (36.4 C) 98.6 F (37 C) 98 F (36.7 C) 98.7 F (37.1 C)  TempSrc:  Oral  Oral  SpO2: 100% 99% 98% 97%  Weight:      Height:        Intake/Output Summary (Last 24 hours) at 05/10/2021 1108 Last data filed at 05/10/2021 1058 Gross per 24 hour  Intake 405.85 ml  Output 750 ml  Net -344.15 ml   Filed Weights   05/09/21 1416 05/10/21 0008  Weight: 97.5 kg 94.3 kg    Examination:  General exam: No acute distress Respiratory system: Clear to auscultation. Respiratory effort normal. Cardiovascular system: S1-S2, regular rate and rhythm, no murmurs, no pedal edema Gastrointestinal system: Soft, nontender,, normal bowel Central nervous system: Alert and oriented. No focal neurological deficits. Extremities: Symmetric 5 x 5 power.  Skin: Plantar foot wound with erythema surrounding and scabbing on the medial side extending to the ankle and slightly swollen plantar side of the second third and fourth digit with ulceration on the lateral side of the foot and yellowish eschar with erythema extending throughout the dorsum of the foot and associated tenderness. Psychiatry: Judgement and insight appear normal. Mood & affect appropriate.     Data Reviewed: I  have personally reviewed following labs and imaging studies  CBC: Recent Labs  Lab 05/09/21 1425 05/10/21 0500  WBC 12.8* 9.3  NEUTROABS 10.3*  --   HGB 15.0 12.7*  HCT 42.3 35.6*  MCV 93.8 94.4  PLT 244 242   Basic Metabolic Panel: Recent Labs  Lab 05/09/21 1425 05/10/21 0500  NA 130* 134*  K 3.4* 3.1*  CL 96* 102  CO2 31 27  GLUCOSE 157* 138*  BUN 12 9  CREATININE 0.70 0.66  CALCIUM 13.5* 12.8*   GFR: Estimated Creatinine Clearance: 124.7 mL/min (by C-G formula based on SCr of 0.66 mg/dL). Liver Function Tests: Recent Labs  Lab 05/09/21 1425  AST 52*  ALT 46*  ALKPHOS 114  BILITOT 1.3*  PROT 7.2  ALBUMIN 2.9*   No results for input(s): LIPASE, AMYLASE in the last 168 hours. No results for input(s): AMMONIA in the last 168 hours. Coagulation Profile: Recent Labs  Lab 05/09/21 1425 05/10/21 0500  INR 1.2 1.2   Cardiac Enzymes: No results for input(s): CKTOTAL, CKMB, CKMBINDEX, TROPONINI in the last 168 hours. BNP (last 3 results) No results for input(s): PROBNP in the last 8760 hours. HbA1C: Recent Labs    05/10/21 0500  HGBA1C 6.4*   CBG: Recent Labs  Lab 05/10/21 0827  GLUCAP 144*   Lipid Profile: No results for input(s): CHOL, HDL, LDLCALC, TRIG, CHOLHDL, LDLDIRECT in the last 72 hours. Thyroid Function Tests: No results for input(s): TSH, T4TOTAL, FREET4, T3FREE, THYROIDAB in the last 72 hours. Anemia Panel: No results for input(s): VITAMINB12, FOLATE, FERRITIN, TIBC, IRON, RETICCTPCT in the last 72 hours. Sepsis Labs: Recent Labs  Lab 05/09/21 1425 05/10/21 0500  PROCALCITON  --  3.18  LATICACIDVEN 1.9  --     Recent Results (from the past 240 hour(s))  Culture, blood (Routine x 2)     Status: None (Preliminary result)   Collection Time: 05/09/21  2:25 PM   Specimen: BLOOD  Result Value Ref Range Status   Specimen Description BLOOD BLOOD RIGHT FOREARM  Final   Special Requests   Final    BOTTLES DRAWN AEROBIC AND ANAEROBIC  Blood Culture adequate volume   Culture   Final    NO GROWTH < 24 HOURS Performed at [facility], [address] [place], [place] [address]    Report Status PENDING  Incomplete  Surgical PCR screen     Status: None   Collection Time: 05/10/21  2:29 AM   Specimen: Nasal Mucosa; Nasal Swab  Result Value Ref Range Status   MRSA, PCR NEGATIVE NEGATIVE Final   Staphylococcus aureus NEGATIVE NEGATIVE Final    Comment: (NOTE) The Xpert SA Assay (FDA approved for NASAL specimens in patients 98 years of age and older), is one component of a comprehensive surveillance program. It is not intended to diagnose infection nor to guide or monitor treatment. Performed at [facility], [address] [place],  [address]          Radiology Studies: MR FOOT RIGHT WO CONTRAST  Result Date: 05/10/2021 CLINICAL DATA:  Osteomyelitis  of the foot.  Chronic foot wounds. EXAM: MRI OF THE RIGHT FOREFOOT WITHOUT CONTRAST TECHNIQUE: Multiplanar, multisequence MR imaging of the right forefoot was performed. No intravenous contrast was administered. COMPARISON:  05/09/2021 radiograph and prior MRI from 07/09/2020 FINDINGS: Bones/Joint/Cartilage Prior amputations of the fifth ray at the proximal metatarsal, and the first ray at the MTP joint. Fracture/bony destruction in the distal metaphysis of the second metatarsal with extensive edema in the metatarsal head and shaft favoring active osteomyelitis. Dorsal dislocation of the second proximal phalanx with respect to the metatarsal head. Extensive edema signal throughout the marrow of the phalanges of the second toe compatible with osteomyelitis. Plantar draining sinus tract from the head of the second metatarsal to the cutaneous surface as shown on image 15 series 5. Extensive marrow edema in the phalanges of the third digit and in the distal half of the third metatarsal compatible with active osteomyelitis. Abnormal edema in the head of the  fourth metatarsal and in the phalanges of the fourth toe compatible with active osteomyelitis. Mild abnormal edema in the first metatarsal head and in the adjacent sesamoids, probably degenerative and less likely from osteomyelitis. Substantial degenerative findings in the midfoot and along the Lisfranc joint with associated spurring and degenerative subcortical foci of edema. Low-level likely reactive marrow edema in the cuboid. Ligaments Lisfranc ligament intact Muscles and Tendons Edema tracks in the plantar musculature of the foot, suspicious for myositis. Flexor hallucis longus tendinopathy distally with fusiform expansion and irregularity of the distal tendon for example on image 33 series 5. Soft tissues Small amount of gas in the dorsal soft tissues of the third toe compatible with infection. Subcutaneous edema along the dorsum of the foot. Possible ulceration lateral to the fifth metatarsal head. Draining sinus tract from the second metatarsal head to the plantar surface of the foot. IMPRESSION: 1. Substantial osteomyelitis of the forefoot, most striking involving the second metatarsal and second toe where there is a draining sinus tract extending to the plantar cutaneous surface below the second metatarsal head, along with destructive findings in the second metatarsal head and proximal metaphysis. Active osteomyelitis of the phalanges of the third and fourth toes and distally in the third and fourth metatarsals. 2. Prior fifth ray amputation at the proximal metaphysis. Prior first digit amputation at the MTP joint. Low-grade edema along the first metatarsal head and adjacent sesamoids is probably due to arthropathy, less likely osteomyelitis. 3. Degenerative findings in the midfoot and Lisfranc joint. 4. Myositis involving the plantar musculature of the foot. 5. Small amount of gas dorsally in the third toe. 6. Flexor hallucis longus distal tendinopathy. Electronically Signed   By: Van Clines M.D.    On: 05/10/2021 04:58   DG Foot Complete Right  Result Date: 05/09/2021 CLINICAL DATA:  Foot infection EXAM: RIGHT FOOT COMPLETE - 3+ VIEW COMPARISON:  Toe radiograph 07/05/2020, foot MRI 07/09/2020 FINDINGS: Prior great toe amputation. There is a mildly displaced fracture of the distal second metatarsal neck. There is subluxation of the second MTP joint and dislocation of the third MTP joints. Prior fifth ray amputation. 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 Forefoot soft tissue swelling with plantar soft tissue ulcer. IMPRESSION: Mild displaced fracture of the distal second metatarsal neck, which could be pathologic from underlying infection/osteomyelitis. Forefoot soft tissue swelling with plantar soft tissue ulcer. Subluxation/dislocation of the second and third MTP joints. Prior great toe amputation and fifth ray amputations. Electronically Signed   By: Ileene Patrick.D.  On: 05/09/2021 15:38   DG MINI C-ARM IMAGE ONLY  Result Date: 05/10/2021 There is no interpretation for this exam.  This order is for images obtained during a surgical procedure.  Please See "Surgeries" Tab for more information regarding the procedure.        Scheduled Meds:  [MAR Hold] atorvastatin  40 mg Oral QHS   [MAR Hold]  HYDROmorphone (DILAUDID) injection  1 mg Intravenous Once   [MAR Hold] insulin aspart  0-15 Units Subcutaneous Q4H   [MAR Hold] mupirocin ointment  1 application Nasal BID   [MAR Hold] potassium chloride  40 mEq Oral Once   povidone-iodine  2 application Topical Once   Continuous Infusions:  [MAR Hold] piperacillin-tazobactam (ZOSYN)  IV 3.375 g (05/10/21 0605)   [MAR Hold] vancomycin 2,000 mg (05/10/21 0812)     LOS: 1 day    Time spent: 25 minutes    Sidney Ace, MD [facility] Pager [phone]  If 7PM-7AM, please contact night-coverage 05/10/2021, 11:08 AM

## 2021-05-10 NOTE — Anesthesia Procedure Notes (Signed)
Procedure Name: LMA Insertion Date/Time: 05/10/2021 10:55 AM Performed by: Aline Brochure, CRNA Pre-anesthesia Checklist: Patient identified, Patient being monitored, Timeout performed, Emergency Drugs available and Suction available Patient Re-evaluated:Patient Re-evaluated prior to induction Oxygen Delivery Method: Circle system utilized Preoxygenation: Pre-oxygenation with 100% oxygen Induction Type: IV induction Ventilation: Mask ventilation without difficulty LMA: LMA inserted LMA Size: 4.5 Tube type: Oral Number of attempts: 1 Placement Confirmation: positive ETCO2 and breath sounds checked- equal and bilateral Tube secured with: Tape Dental Injury: Teeth and Oropharynx as per pre-operative assessment

## 2021-05-11 LAB — BASIC METABOLIC PANEL
Anion gap: 4 — ABNORMAL LOW (ref 5–15)
BUN: 8 mg/dL (ref 6–20)
CO2: 27 mmol/L (ref 22–32)
Calcium: 11.8 mg/dL — ABNORMAL HIGH (ref 8.9–10.3)
Chloride: 102 mmol/L (ref 98–111)
Creatinine, Ser: 0.58 mg/dL — ABNORMAL LOW (ref 0.61–1.24)
GFR, Estimated: >60 mL/min (ref >60)
Glucose, Bld: 126 mg/dL — ABNORMAL HIGH (ref 70–99)
Potassium: 3.5 mmol/L (ref 3.5–5.1)
Sodium: 133 mmol/L — ABNORMAL LOW (ref 135–145)

## 2021-05-11 LAB — GLUCOSE, CAPILLARY
Glucose-Capillary: 117 mg/dL — ABNORMAL HIGH (ref 70–99)
Glucose-Capillary: 120 mg/dL — ABNORMAL HIGH (ref 70–99)
Glucose-Capillary: 151 mg/dL — ABNORMAL HIGH (ref 70–99)
Glucose-Capillary: 144 mg/dL — ABNORMAL HIGH (ref 70–99)
Glucose-Capillary: 168 mg/dL — ABNORMAL HIGH (ref 70–99)

## 2021-05-11 MED ORDER — MORPHINE SULFATE (PF) 2 MG/ML IV SOLN: 2.0000 mg | INTRAVENOUS | Status: DC | PRN

## 2021-05-11 MED ORDER — SODIUM CHLORIDE 0.9 % IV SOLN
INTRAVENOUS | Status: DC | PRN
  Administered 2021-05-11 – 2021-05-13 (×3): 1000 mL via INTRAVENOUS

## 2021-05-11 MED ORDER — INSULIN ASPART 100 UNIT/ML IJ SOLN
0.0000 [IU] | Freq: Three times a day (TID) | INTRAMUSCULAR | Status: DC
  Administered 2021-05-11: 2 [IU] via SUBCUTANEOUS
  Administered 2021-05-11: 3 [IU] via SUBCUTANEOUS
  Administered 2021-05-12: 2 [IU] via SUBCUTANEOUS
  Administered 2021-05-12 (×2): 3 [IU] via SUBCUTANEOUS
  Administered 2021-05-13: 2 [IU] via SUBCUTANEOUS
  Administered 2021-05-13: 3 [IU] via SUBCUTANEOUS
  Administered 2021-05-13: 2 [IU] via SUBCUTANEOUS
  Administered 2021-05-14: 3 [IU] via SUBCUTANEOUS
  Administered 2021-05-14: 2 [IU] via SUBCUTANEOUS
  Administered 2021-05-15: 5 [IU] via SUBCUTANEOUS
  Administered 2021-05-15: 3 [IU] via SUBCUTANEOUS
  Administered 2021-05-15: 5 [IU] via SUBCUTANEOUS
  Administered 2021-05-16: 3 [IU] via SUBCUTANEOUS
  Administered 2021-05-16: 5 [IU] via SUBCUTANEOUS
  Administered 2021-05-16: 3 [IU] via SUBCUTANEOUS
  Administered 2021-05-17: 5 [IU] via SUBCUTANEOUS
  Administered 2021-05-17: 3 [IU] via SUBCUTANEOUS
  Administered 2021-05-17 – 2021-05-18 (×3): 5 [IU] via SUBCUTANEOUS
  Administered 2021-05-18: 3 [IU] via SUBCUTANEOUS
  Administered 2021-05-19: 5 [IU] via SUBCUTANEOUS
  Administered 2021-05-19: 2 [IU] via SUBCUTANEOUS
  Administered 2021-05-19: 5 [IU] via SUBCUTANEOUS
  Administered 2021-05-20: 2 [IU] via SUBCUTANEOUS
  Administered 2021-05-20: 5 [IU] via SUBCUTANEOUS
  Administered 2021-05-20: 3 [IU] via SUBCUTANEOUS
  Administered 2021-05-21 (×2): 5 [IU] via SUBCUTANEOUS
  Administered 2021-05-21 – 2021-05-22 (×2): 3 [IU] via SUBCUTANEOUS
  Filled 2021-05-11 (×30): qty 1

## 2021-05-11 MED ORDER — OXYCODONE HCL 5 MG PO TABS
5.0000 mg | ORAL_TABLET | ORAL | Status: DC | PRN
  Administered 2021-05-21: 5 mg via ORAL
  Filled 2021-05-11 (×2): qty 1

## 2021-05-11 NOTE — Progress Notes (Signed)
Daily Progress Note   Subjective  - 1 Day Post-Op  Status post transmetatarsal amputation right foot.  Doing well.  No complaints of pain.  Objective Vitals:   05/10/21 1612 05/10/21 1956 05/11/21 0456 05/11/21 0734  BP: 118/76 128/85 120/79 120/79  Pulse: 72 81 73 78  Resp: '18 17 17 '$ (!) 24  Temp: (!) 97.5 F (36.4 C) 98.6 F (37 C) 98.4 F (36.9 C) 98 F (36.7 C)  TempSrc:      SpO2: 100% 100% 98% 93%  Weight:      Height:        Physical Exam: Incision is well coapted.  The area packing was removed.  Some clear drainage with mixed hemorrhagic drainage noted.  No purulence today.  No signs of foul odor at this point.  Laboratory CBC    Component Value Date/Time   WBC 9.3 05/10/2021 0500   HGB 12.7 (L) 05/10/2021 0500   HCT 35.6 (L) 05/10/2021 0500   PLT 222 05/10/2021 0500    BMET    Component Value Date/Time   NA 133 (L) 05/11/2021 0618   K 3.5 05/11/2021 0618   CL 102 05/11/2021 0618   CO2 27 05/11/2021 0618   GLUCOSE 126 (H) 05/11/2021 0618   BUN 8 05/11/2021 0618   CREATININE 0.58 (L) 05/11/2021 0618   CALCIUM 11.8 (H) 05/11/2021 0618   GFRNONAA >60 05/11/2021 0618   GFRAA >60 06/12/2019 1556    Assessment/Planning: Status post transmetatarsal amputation right foot for osteomyelitis and severe diabetic foot infection  Dressing changed today.  Patient should remain minimally weightbearing.  Only weight-bear to his heel if needed.  Likely for balance or transfer only.  Otherwise should try to remain nonweightbearing. Will order a postop shoe. Patient will need PICC line for long-term IV antibiotics.  Recommend ID consultation as well. Will follow-up tomorrow  Samara Deist A  05/11/2021, 9:22 AM

## 2021-05-11 NOTE — Evaluation (Signed)
Physical Therapy Evaluation Patient Details Name: [name] MRN: [id number] DOB: July 04, 1966 Today's Date: 05/11/2021   History of Present Illness  55 y.o. Caucasian male with medical history significant for type 2 diabetes mellitus, hypertension and dyslipidemia, who presented to the emergency room with acute onset of infected right foot wound.  Due to previous history of right foot wound and osteomyelitis he had a great toe and fifth toe amputated.  Swelling has been extending to his whole foot.  His wound was initially over the plantar aspect of his distal foot and later he developed a wound over the lateral aspect.  He now notices foul-smelling drainage and increasing pain that is becoming throbbing in graded 10/10 in severity, worsening with weightbearing.  The redness and pain has been significantly worsening despite wound care that was being provided by his friend who is a retired [employer] scientist. Surgery yesterday by Dr Vickki Muff -Transmetatarsal amputation right forefoot    Clinical Impression  Pt received in Semi-Fowler's position and agreeable to therapy.  Pt asked ahead of time of which AD he would prefer and he requested crutches over walker.  Pt attempted to utilize crutches with getting out of bed, however even with proper education, pt was not able to sequence events correctly and unable to abide by weight bearing restrictions.  Pt was impulsive throughout session and needed to switch to the walker for safer ambulation.  Pt did perform much better with tall RW, however still fatigues quickly.  Pt given verbal and visual cuing for curb navigation, and pt was able to attempt.  Pt required mod-maxA +2 for pt to come onto platform, however pt was unable to bring the walker up onto the platform.  Pt then transferred back to the room via chair.  Pt will benefit from skilled PT intervention to increase independence and safety with basic mobility in preparation for discharge to the venue listed below.  Pt  agreeable to this recommendation.     Follow Up Recommendations SNF    Equipment Recommendations  Rolling walker with 5" wheels;3in1 (PT);Other (comment) (Tall Walker required.)    Recommendations for Other Services       Precautions / Restrictions Precautions Precautions: Fall Restrictions Weight Bearing Restrictions: Yes RLE Weight Bearing: Non weight bearing Other Position/Activity Restrictions: minimal weight bearing thru heal with transfer and balance      Mobility  Bed Mobility Overal bed mobility: Modified Independent               Patient Response: Anxious;Impulsive;Flat affect  Transfers Overall transfer level: Needs assistance Equipment used: Crutches;Rolling walker (2 wheeled) Transfers: Sit to/from Stand Sit to Stand: Mod assist;Max assist         General transfer comment: Pt required mod-maxA to come upright with use of the crutches and maximal verbal cuing for hand placement.  Pt also needed same level of assistance for sitting from standing position.  Ambulation/Gait Ambulation/Gait assistance: Mod assist;Max assist Gait Distance (Feet): 20 Feet Assistive device: Rolling walker (2 wheeled);Crutches Gait Pattern/deviations:  (Hop-to pattern) Gait velocity: Decreased   General Gait Details: Pt very weak and clumsy with crutches.  Pt has little to no safety awareness.  Stairs            Wheelchair Mobility    Modified Rankin (Stroke Patients Only)       Balance Overall balance assessment: Needs assistance Sitting-balance support: Feet supported Sitting balance-Leahy Scale: Fair     Standing balance support: Bilateral upper extremity supported Standing balance-Leahy Scale: Poor  Standing balance comment: Pt requires maxA from therapist to remain upright even with B UE support through walker and crutches.                             Pertinent Vitals/Pain Pain Location: " [place]" - did not give pain scale     Home Living Family/patient expects to be discharged to:: Private residence Living Arrangements: Alone   Type of Home: Apartment Home Access: Ramped entrance     [place]: One level        Prior Function           Comments: does not work at [employer] anymore     Hand Dominance   Dominant Hand: Right    Extremity/Trunk Assessment   Upper Extremity Assessment Upper Extremity Assessment: Overall WFL for tasks assessed (but weak in upper back muscle for pushing thru walker)    Lower Extremity Assessment Lower Extremity Assessment: Overall WFL for tasks assessed       Communication   Communication: No difficulties  Cognition Arousal/Alertness: Awake/alert Behavior During Therapy: WFL for tasks assessed/performed                                   General Comments: impulsive , poor safety - not following directions      General Comments      Exercises Other Exercises Other Exercises: Pt Ind in supine to sit , but sit to supine stepping on R foot on railing to scoot back - after reminding again NWB Other Exercises: Impulsive and few near falls with crutches- caught and supported by PT, done better wtih RW -tall -but when fatigue- get anxious and poor safety - sitting down when far from chair and bed Other Exercises: UB ADl's Ind with setup and sitting, LB dressing - donn sock and shoe - supervision - BUT standing to pull up pants Max A unable to maintain balance in standing - poor standing balance   Assessment/Plan    PT Assessment Patient needs continued PT services  PT Problem List Decreased strength;Decreased activity tolerance;Decreased balance;Decreased mobility;Decreased coordination;Decreased knowledge of use of DME;Decreased safety awareness;Decreased knowledge of precautions       PT Treatment Interventions DME instruction;Gait training;Stair training;Functional mobility training;Therapeutic activities;Therapeutic exercise;Balance  training;Neuromuscular re-education;Patient/family education    PT Goals (Current goals can be found in the Care Plan section)  Acute Rehab PT Goals Patient Stated Goal: Want to be able to walk and get around safety PT Goal Formulation: With patient Time For Goal Achievement: 05/25/21 Potential to Achieve Goals: Fair    Frequency 7X/week   Barriers to discharge Inaccessible home environment;Decreased caregiver support Pt has a curb he will have to navigate and is unsafe to perform at this time.    Co-evaluation PT/OT/SLP Co-Evaluation/Treatment: Yes Reason for Co-Treatment: Complexity of the patient's impairments (multi-system involvement);Necessary to address cognition/behavior during functional activity;For patient/therapist safety;To address functional/ADL transfers PT goals addressed during session: Mobility/safety with mobility;Balance;Proper use of DME OT goals addressed during session: ADL's and self-care;Proper use of Adaptive equipment and DME;Strengthening/ROM       AM-PAC PT "6 Clicks" Mobility  Outcome Measure Help needed turning from your back to your side while in a flat bed without using bedrails?: None Help needed moving from lying on your back to sitting on the side of a flat bed without using bedrails?: None Help needed  moving to and from a bed to a chair (including a wheelchair)?: A Lot Help needed standing up from a chair using your arms (e.g., wheelchair or bedside chair)?: A Lot Help needed to walk in hospital room?: A Lot Help needed climbing 3-5 steps with a railing? : Total 6 Click Score: 15    End of Session Equipment Utilized During Treatment: Gait belt Activity Tolerance: Patient limited by fatigue Patient left: in bed;with call bell/phone within reach;with bed alarm set Nurse Communication: Mobility status PT Visit Diagnosis: Unsteadiness on feet (R26.81);Other abnormalities of gait and mobility (R26.89);Muscle weakness (generalized)  (M62.81);Difficulty in walking, not elsewhere classified (R26.2)    Time: 1445-1550 PT Time Calculation (min) (ACUTE ONLY): 65 min   Charges:   PT Evaluation $PT Eval Low Complexity: 1 Low PT Treatments $Gait Training: 53-67 mins        Gwenlyn Saran, PT, DPT 05/11/21, 5:14 PM   Christie Nottingham 05/11/2021, 5:03 PM

## 2021-05-11 NOTE — Progress Notes (Signed)
PROGRESS NOTE    [name]  QEF:[id number] DOB: 03-19-1966 DOA: 05/09/2021 PCP: Pcp, No    Brief Narrative:  55 y.o. Caucasian male with medical history significant for type 2 diabetes mellitus, hypertension and dyslipidemia, who presented to the emergency room with acute onset of infected right foot wound.  Due to previous history of right foot wound and osteomyelitis he had a great toe and fifth toe amputated.  Swelling has been extending to his whole foot.  His wound was initially over the plantar aspect of his distal foot and later he developed a wound over the lateral aspect.  He now notices foul-smelling drainage and increasing pain that is becoming throbbing in graded 10/10 in severity, worsening with weightbearing.  The redness and pain has been significantly worsening despite wound care that was being provided by his friend who is a retired Engineer, civil (consulting).   Patient seen in consultation by podiatry.  MRI pursued with significant osteomyelitis and tissue loss.  Status post transmetatarsal amputation right foot.  Doing well.  Pain well controlled.  Will need ID consultation for IV antibiotic therapy.  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  As needed pain control.  Monitor blood and wound cultures.  Will need ID consultation however ID not available over the weekend.  Will consult ID Monday 8/15.  Hypercalcemia Unclear etiology, possibly from bone breakdown Improved after hydration normal saline and 1 dose Lasix Plan: Monitor calcium levels At this time no indication for IV bisphosphonate  Type 2 diabetes mellitus Home sulfonylurea has been held Continue sliding-scale  coverage, 3 times daily before meals and at bedtime   Hyperlipidemia PTA statin   DVT prophylaxis: SCDs Code Status: Full Family Communication: None today Disposition Plan: Status is: Inpatient  Remains inpatient appropriate because:Inpatient level of care appropriate due to severity of illness  Dispo: The patient is from: Home              Anticipated d/c is to:  TBD              Patient currently is not medically stable to d/c.   Difficult to place patient No  Postop day #1 status post transmetatarsal amputation.  On IV antibiotics.  Will need infectious disease consultation on Monday.     Level of care: Med-Surg  Consultants:  Podiatry  Procedures:  Transmetatarsal amputation 8/12  Antimicrobials:   IV vancomycin Zosyn   Subjective: The examined.  Pain well controlled.  Sitting comfortably in bed.  No complaints  Objective: Vitals:   05/10/21 1612 05/10/21 1956 05/11/21 0456 05/11/21 0734  BP: 118/76 128/85 120/79 120/79  Pulse: 72 81 73 78  Resp: 18 17 17  (!) 24  Temp: (!) 97.5 F (36.4 C) 98.6 F (37 C) 98.4 F (36.9 C) 98 F (36.7 C)  TempSrc:      SpO2: 100% 100% 98% 93%  Weight:      Height:        Intake/Output Summary (Last 24 hours) at 05/11/2021 1027 Last data filed at 05/10/2021 1836 Gross per 24 hour  Intake 1060 ml  Output --  Net 1060 ml   Filed Weights   05/09/21 1416 05/10/21 0008  Weight: 97.5 kg 94.3 kg    Examination:  General exam:  No acute distress Respiratory system: Clear to auscultation. Respiratory effort normal. Cardiovascular system: S1-S2 heard, regular rate and rhythm, no murmurs, no pedal edema  gastrointestinal system: Soft, nontender,, normal bowel sounds Central nervous system: Alert and oriented. No focal neurological deficits. Extremities: Symmetric 5 x 5 power. Skin: Right foot in surgical dressings.  Dressing CDI. Psychiatry: Judgement and insight appear normal. Mood & affect appropriate.     Data  Reviewed: I have personally reviewed following labs and imaging studies  CBC: Recent Labs  Lab 05/09/21 1425 05/10/21 0500  WBC 12.8* 9.3  NEUTROABS 10.3*  --   HGB 15.0 12.7*  HCT 42.3 35.6*  MCV 93.8 94.4  PLT 244 259   Basic Metabolic Panel: Recent Labs  Lab 05/09/21 1425 05/10/21 0500 05/11/21 0618  NA 130* 134* 133*  K 3.4* 3.1* 3.5  CL 96* 102 102  CO2 _0 GLUCOSE 157* 138* 126*  BUN _1 CREATININE 0.70 0.66 0.58*  CALCIUM 13.5* 12.8* 11.8*   GFR: Estimated Creatinine Clearance: 124.7 mL/min (A) (by C-G formula based on SCr of 0.58 mg/dL (L)). Liver Function Tests: Recent Labs  Lab 05/09/21 1425  AST 52*  ALT 46*  ALKPHOS 114  BILITOT 1.3*  PROT 7.2  ALBUMIN 2.9*   No results for input(s): LIPASE, AMYLASE in the last 168 hours. No results for input(s): AMMONIA in the last 168 hours. Coagulation Profile: Recent Labs  Lab 05/09/21 1425 05/10/21 0500  INR 1.2 1.2   Cardiac Enzymes: No results for input(s): CKTOTAL, CKMB, CKMBINDEX, TROPONINI in the last 168 hours. BNP (last 3 results) No results for input(s): PROBNP in the last 8760 hours. HbA1C: Recent Labs    05/10/21 0500  HGBA1C 6.4*   CBG: Recent Labs  Lab 05/10/21 1633 05/10/21 1958 05/10/21 2345 05/11/21 0458 05/11/21 0726  GLUCAP 179* 149* 151* 117* 120*   Lipid Profile: No results for input(s): CHOL, HDL, LDLCALC, TRIG, CHOLHDL, LDLDIRECT in the last 72 hours. Thyroid Function Tests: No results for input(s): TSH, T4TOTAL, FREET4, T3FREE, THYROIDAB in the last 72 hours. Anemia Panel: No results for input(s): VITAMINB12, FOLATE, FERRITIN, TIBC, IRON, RETICCTPCT in the last 72 hours. Sepsis Labs: Recent Labs  Lab 05/09/21 1425 05/10/21 0500  PROCALCITON  --  3.18  LATICACIDVEN 1.9  --     Recent Results (from the past 240 hour(s))  Culture, blood (Routine x 2)     Status: None (Preliminary result)   Collection Time: 05/09/21  2:25 PM   Specimen: BLOOD  Result  Value Ref Range Status   Specimen Description BLOOD BLOOD RIGHT FOREARM  Final   Special Requests   Final    BOTTLES DRAWN AEROBIC AND ANAEROBIC Blood Culture adequate volume   Culture   Final    NO GROWTH 2 DAYS Performed at [facility], [address], [place], [place] [address]    Report Status PENDING  Incomplete  SARS CORONAVIRUS 2 (TAT 6-24 HRS) Nasopharyngeal Nasopharyngeal Swab     Status: None   Collection Time: 05/09/21 10:05 PM   Specimen: Nasopharyngeal Swab  Result Value Ref Range Status   SARS Coronavirus 2 NEGATIVE NEGATIVE Final    Comment: (NOTE) SARS-CoV-2 target nucleic acids are NOT DETECTED.  The SARS-CoV-2 RNA is generally detectable in upper and lower respiratory specimens during the acute phase of infection. Negative results do not preclude SARS-CoV-2 infection, do not rule out co-infections with other pathogens, and should not be used as the sole basis  for treatment or other patient management decisions. Negative results must be combined with clinical observations, patient history, and epidemiological information. The expected result is Negative.  Fact Sheet for Patients: SugarRoll.be  Fact Sheet for Healthcare Providers: https://www.woods-mathews.com/  This test is not yet approved or cleared by the [place] FDA and  has been authorized for detection and/or diagnosis of SARS-CoV-2 by FDA under an Emergency Use Authorization (EUA). This EUA will remain  in effect (meaning this test can be used) for the duration of the COVID-19 declaration under Se ction 564(b)(1) of the Act, 21 U.S.C. section 360bbb-3(b)(1), unless the authorization is terminated or revoked sooner.  Performed at [facility], [place] [address] [place], [place] [address]   Surgical PCR screen     Status: None   Collection Time: 05/10/21  2:29 AM   Specimen: Nasal Mucosa; Nasal Swab  Result Value Ref Range Status   MRSA,  PCR NEGATIVE NEGATIVE Final   Staphylococcus aureus NEGATIVE NEGATIVE Final    Comment: (NOTE) The Xpert SA Assay (FDA approved for NASAL specimens in patients 34 years of age and older), is one component of a comprehensive surveillance program. It is not intended to diagnose infection nor to guide or monitor treatment. Performed at [facility], [address] [place], [place] [address]   Culture, blood (Routine X 2) w Reflex to ID Panel     Status: None (Preliminary result)   Collection Time: 05/10/21  5:00 AM   Specimen: BLOOD  Result Value Ref Range Status   Specimen Description BLOOD BLOOD LEFT HAND  Final   Special Requests   Final    BOTTLES DRAWN AEROBIC AND ANAEROBIC Blood Culture adequate volume   Culture   Final    NO GROWTH < 24 HOURS Performed at [facility], [address], [place], [place] [address]    Report Status PENDING  Incomplete         Radiology Studies: MR FOOT RIGHT WO CONTRAST  Result Date: 05/10/2021 CLINICAL DATA:  Osteomyelitis of the foot.  Chronic foot wounds. EXAM: MRI OF THE RIGHT FOREFOOT WITHOUT CONTRAST TECHNIQUE: Multiplanar, multisequence MR imaging of the right forefoot was performed. No intravenous contrast was administered. COMPARISON:  05/09/2021 radiograph and prior MRI from 07/09/2020 FINDINGS: Bones/Joint/Cartilage Prior amputations of the fifth ray at the proximal metatarsal, and the first ray at the MTP joint. Fracture/bony destruction in the distal metaphysis of the second metatarsal with extensive edema in the metatarsal head and shaft favoring active osteomyelitis. Dorsal dislocation of the second proximal phalanx with respect to the metatarsal head. Extensive edema signal throughout the marrow of the phalanges of the second toe compatible with osteomyelitis. Plantar draining sinus tract from the head of the second metatarsal to the cutaneous surface as shown on image 15 series 5. Extensive marrow edema in the  phalanges of the third digit and in the distal half of the third metatarsal compatible with active osteomyelitis. Abnormal edema in the head of the fourth metatarsal and in the phalanges of the fourth toe compatible with active osteomyelitis. Mild abnormal edema in the first metatarsal head and in the adjacent sesamoids, probably degenerative and less likely from osteomyelitis. Substantial degenerative findings in the midfoot and along the Lisfranc joint with associated spurring and degenerative subcortical foci of edema. Low-level likely reactive marrow edema in the cuboid. Ligaments Lisfranc ligament intact Muscles and Tendons Edema tracks in the plantar musculature of the foot, suspicious for myositis. Flexor hallucis longus tendinopathy distally  with fusiform expansion and irregularity of the distal tendon for example on image 33 series 5. Soft tissues Small amount of gas in the dorsal soft tissues of the third toe compatible with infection. Subcutaneous edema along the dorsum of the foot. Possible ulceration lateral to the fifth metatarsal head. Draining sinus tract from the second metatarsal head to the plantar surface of the foot. IMPRESSION: 1. Substantial osteomyelitis of the forefoot, most striking involving the second metatarsal and second toe where there is a draining sinus tract extending to the plantar cutaneous surface below the second metatarsal head, along with destructive findings in the second metatarsal head and proximal metaphysis. Active osteomyelitis of the phalanges of the third and fourth toes and distally in the third and fourth metatarsals. 2. Prior fifth ray amputation at the proximal metaphysis. Prior first digit amputation at the MTP joint. Low-grade edema along the first metatarsal head and adjacent sesamoids is probably due to arthropathy, less likely osteomyelitis. 3. Degenerative findings in the midfoot and Lisfranc joint. 4. Myositis involving the plantar musculature of the foot.  5. Small amount of gas dorsally in the third toe. 6. Flexor hallucis longus distal tendinopathy. Electronically Signed   By: Van Clines M.D.   On: 05/10/2021 04:58   DG Foot Complete Right  Result Date: 05/09/2021 CLINICAL DATA:  Foot infection EXAM: RIGHT FOOT COMPLETE - 3+ VIEW COMPARISON:  Toe radiograph 07/05/2020, foot MRI 07/09/2020 FINDINGS: Prior great toe amputation. There is a mildly displaced fracture of the distal second metatarsal neck. There is subluxation of the second MTP joint and dislocation of the third MTP joints. Prior fifth ray amputation. 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 Forefoot soft tissue swelling with plantar soft tissue ulcer. IMPRESSION: Mild displaced fracture of the distal second metatarsal neck, which could be pathologic from underlying infection/osteomyelitis. Forefoot soft tissue swelling with plantar soft tissue ulcer. Subluxation/dislocation of the second and third MTP joints. Prior great toe amputation and fifth ray amputations. Electronically Signed   By: Maurine Simmering M.D.   On: 05/09/2021 15:38   DG MINI C-ARM IMAGE ONLY  Result Date: 05/10/2021 There is no interpretation for this exam.  This order is for images obtained during a surgical procedure.  Please See "Surgeries" Tab for more information regarding the procedure.        Scheduled Meds:  atorvastatin  40 mg Oral QHS   insulin aspart  0-15 Units Subcutaneous Q4H   mupirocin ointment  1 application Nasal BID   Continuous Infusions:  piperacillin-tazobactam (ZOSYN)  IV 3.375 g (05/11/21 9024)   vancomycin 2,000 mg (05/10/21 2341)     LOS: 2 days    Time spent: 25 minutes    Sidney Ace, MD [facility] Pager [phone]  If 7PM-7AM, please contact night-coverage 05/11/2021, 10:27 AM

## 2021-05-11 NOTE — Evaluation (Signed)
Occupational Therapy Evaluation Patient Details Name: [name] MRN: [id number] DOB: 1966/04/05 Today's Date: 05/11/2021    History of Present Illness 54 y.o. Caucasian male with medical history significant for type 2 diabetes mellitus, hypertension and dyslipidemia, who presented to the emergency room with acute onset of infected right foot wound.  Due to previous history of right foot wound and osteomyelitis he had a great toe and fifth toe amputated.  Swelling has been extending to his whole foot.  His wound was initially over the plantar aspect of his distal foot and later he developed a wound over the lateral aspect.  He now notices foul-smelling drainage and increasing pain that is becoming throbbing in graded 10/10 in severity, worsening with weightbearing.  The redness and pain has been significantly worsening despite wound care that was being provided by his friend who is a retired Marine scientist. Surgery yesterday by Dr Vickki Muff -Transmetatarsal amputation right forefoot   Clinical Impression   Pt in bed upon OT/PT arrival - willing to work with therapy. Pt report wanting to use crutches. Pt with poor safety and impulsive using crutches - had to bring w/c after few steps- Assess functional mobility with RW ( tall)  in clinic, pt better - but still show loss of balance, poor safety , impulsiveness with getting anxious when fatigue. Pt with decrease strength in UB for pushing thru walker- need max v/c for sequencing and to slow down. Pt Mod A for LB dressing, toilet clothing management. UB ADL's and grooming sitting with setup Supervision. Pt can benefit from skilled OT services to address above impairments - pt do not have help at home. Has curve to get up to get to ramp going into his house- pt in agreement at this time for STR/SNF.    Follow Up Recommendations  SNF    Equipment Recommendations  3 in 1 bedside commode;Tub/shower bench;Toilet rise with handles    Recommendations for Other Services        Precautions / Restrictions Precautions Precautions: Fall Restrictions RLE Weight Bearing: Non weight bearing Other Position/Activity Restrictions: minimal weight bearing thru heal with transfer and balance      Mobility Bed Mobility                    Transfers                      Balance                                           ADL either performed or assessed with clinical judgement   ADL                                               Vision         Perception     Praxis      Pertinent Vitals/Pain Pain Location: " shooting star" - did not give pain scale     Hand Dominance Right   Extremity/Trunk Assessment Upper Extremity Assessment Upper Extremity Assessment: Overall WFL for tasks assessed (but weak in upper back muscle for pushing thru walker)   Lower Extremity Assessment Lower Extremity Assessment: Defer to PT evaluation       Communication Communication Communication: No  difficulties   Cognition Arousal/Alertness: Awake/alert Behavior During Therapy: WFL for tasks assessed/performed                                   General Comments: impulsive , poor safety - not following directions   General Comments       Exercises Other Exercises Other Exercises: Pt Ind in supine to sit , but sit to supine stepping on R foot on railing to scoot back - after reminding again NWB Other Exercises: Impulsive and few near falls with crutches- caught and supported by PT, done better wtih RW -tall -but when fatigue- get anxious and poor safety - sitting down when far from chair and bed Other Exercises: UB ADl's Ind with setup and sitting, LB dressing - donn sock and shoe - supervision - BUT standing to pull up pants Max A unable to maintain balance in standing - poor standing balance   Shoulder Instructions      Home Living Family/patient expects to be discharged to:: Private  residence Living Arrangements: Alone   Type of Home: Apartment Home Access: Ramped entrance     Home Layout: One level     Bathroom Shower/Tub: Teacher, early years/pre: Standard                Prior Functioning/Environment          Comments: do nto work at [employer] anymore        OT Problem List: Decreased strength;Decreased knowledge of use of DME or AE;Decreased activity tolerance;Pain;Impaired UE functional use;Decreased knowledge of precautions      OT Treatment/Interventions: Self-care/ADL training;Therapeutic exercise;Patient/family education;Neuromuscular education    OT Goals(Current goals can be found in the care plan section) Acute Rehab OT Goals Patient Stated Goal: Want to be able to walk and get around safety OT Goal Formulation: With patient Time For Goal Achievement: 05/25/21 Potential to Achieve Goals: Good  OT Frequency: Min 2X/week   Barriers to D/C: Decreased caregiver support;Inaccessible home environment          Co-evaluation PT/OT/SLP Co-Evaluation/Treatment: Yes Reason for Co-Treatment: Complexity of the patient's impairments (multi-system involvement);Necessary to address cognition/behavior during functional activity;For patient/therapist safety;To address functional/ADL transfers   OT goals addressed during session: ADL's and self-care;Proper use of Adaptive equipment and DME;Strengthening/ROM      AM-PAC OT "6 Clicks" Daily Activity     Outcome Measure Help from another person eating meals?: A Little Help from another person taking care of personal grooming?: A Little Help from another person toileting, which includes using toliet, bedpan, or urinal?: A Lot Help from another person bathing (including washing, rinsing, drying)?: A Lot Help from another person to put on and taking off regular upper body clothing?: A Little Help from another person to put on and taking off regular lower body clothing?: A Lot 6 Click Score:  15   End of Session Equipment Utilized During Treatment: Gait belt Nurse Communication: Mobility status  Activity Tolerance: Patient tolerated treatment well;Other (comment) (poor safety and impulsive - anxious if fatigue) Patient left: in bed;with bed alarm set  OT Visit Diagnosis: Unsteadiness on feet (R26.81);Other abnormalities of gait and mobility (R26.89);Muscle weakness (generalized) (M62.81);Pain Pain - Right/Left: Right Pain - part of body: Ankle and joints of foot                Time: 1445-1515 OT Time Calculation (min): 30 min Charges:  OT  General Charges $OT Visit: 1 Visit OT Evaluation $OT Eval Low Complexity: 1 Low   [name] OTR/L,CLT 05/11/2021, 4:25 PM

## 2021-05-12 LAB — GLUCOSE, CAPILLARY
Glucose-Capillary: 171 mg/dL — ABNORMAL HIGH (ref 70–99)
Glucose-Capillary: 165 mg/dL — ABNORMAL HIGH (ref 70–99)
Glucose-Capillary: 124 mg/dL — ABNORMAL HIGH (ref 70–99)
Glucose-Capillary: 134 mg/dL — ABNORMAL HIGH (ref 70–99)

## 2021-05-12 LAB — VANCOMYCIN, RANDOM: Vancomycin Rm: 10

## 2021-05-12 NOTE — Progress Notes (Signed)
Physical Therapy Treatment Patient Details Name: [name] MRN: [id number] DOB: 1966/06/16 Today's Date: 05/12/2021    History of Present Illness 55 y.o. Caucasian male with medical history significant for type 2 diabetes mellitus, hypertension and dyslipidemia, who presented to the emergency room with acute onset of infected right foot wound.  Due to previous history of right foot wound and osteomyelitis he had a great toe and fifth toe amputated.  Swelling has been extending to his whole foot.  His wound was initially over the plantar aspect of his distal foot and later he developed a wound over the lateral aspect.  He now notices foul-smelling drainage and increasing pain that is becoming throbbing in graded 10/10 in severity, worsening with weightbearing.  The redness and pain has been significantly worsening despite wound care that was being provided by his friend who is a retired Marine scientist. Surgery yesterday by Dr Vickki Muff -Transmetatarsal amputation right forefoot    PT Comments    Bed mobility without assist.  Session focused on safety and technique of sit to stand tranfsers and bed <-> chair. Overal quality and safety improved significantly with practice but he still requires +1 for general safety.  He is able to progress to 2 gait trials to door and back with good ability to maintain NWB.  Walker is lowered and improves gait and decreased UE fatigue.  Remained in recliner after session.   Follow Up Recommendations  SNF     Equipment Recommendations  Rolling walker with 5" wheels;3in1 (PT);Other (comment) (Tall Walker required.)    Recommendations for Other Services       Precautions / Restrictions Precautions Precautions: Fall Restrictions RLE Weight Bearing: Non weight bearing Other Position/Activity Restrictions: minimal weight bearing thru heal with transfer and balance    Mobility  Bed Mobility Overal bed mobility: Modified Independent                   Transfers Overall transfer level: Needs assistance Equipment used: Rolling walker (2 wheeled) Transfers: Sit to/from Stand Sit to Stand: Min assist            Ambulation/Gait Ambulation/Gait assistance: Herbalist (Feet): 30 Feet Assistive device: Rolling walker (2 wheeled)   Gait velocity: Decreased   General Gait Details: hop to NWB gait to door and back x 2 with seated rest   Stairs             Wheelchair Mobility    Modified Rankin (Stroke Patients Only)       Balance Overall balance assessment: Needs assistance Sitting-balance support: Feet supported Sitting balance-Leahy Scale: Good     Standing balance support: Bilateral upper extremity supported Standing balance-Leahy Scale: Poor                              Cognition Arousal/Alertness: Awake/alert Behavior During Therapy: WFL for tasks assessed/performed Overall Cognitive Status: Within Functional Limits for tasks assessed                                        Exercises Other Exercises Other Exercises: multiple sit to stand and bed <-> recliner transfers with emphasis on safety and technique    General Comments        Pertinent Vitals/Pain Pain Assessment: No/denies pain    Home Living  Prior Function            PT Goals (current goals can now be found in the care plan section) Progress towards PT goals: Progressing toward goals    Frequency    7X/week      PT Plan      Co-evaluation              AM-PAC PT "6 Clicks" Mobility   Outcome Measure  Help needed turning from your back to your side while in a flat bed without using bedrails?: None Help needed moving from lying on your back to sitting on the side of a flat bed without using bedrails?: None Help needed moving to and from a bed to a chair (including a wheelchair)?: A Little Help needed standing up from a chair using your arms  (e.g., wheelchair or bedside chair)?: A Little Help needed to walk in hospital room?: A Little Help needed climbing 3-5 steps with a railing? : Total 6 Click Score: 18    End of Session Equipment Utilized During Treatment: Gait belt Activity Tolerance: Patient limited by fatigue Patient left: in bed;with call bell/phone within reach;with bed alarm set Nurse Communication: Mobility status PT Visit Diagnosis: Unsteadiness on feet (R26.81);Other abnormalities of gait and mobility (R26.89);Muscle weakness (generalized) (M62.81);Difficulty in walking, not elsewhere classified (R26.2)     Time: [id number] PT Time Calculation (min) (ACUTE ONLY): 39 min  Charges:  $Gait Training: 8-22 mins $Therapeutic Activity: 23-37 mins                    [name], PTA 05/12/21, 10:21 AM 05/12/2021, 10:19 AM

## 2021-05-12 NOTE — Progress Notes (Signed)
Daily Progress Note   Subjective  - 2 Days Post-Op  F/u TMA.  No complaints.  Objective Vitals:   05/11/21 1521 05/11/21 1933 05/12/21 0411 05/12/21 0741  BP: (!) 145/88 134/81 (!) 136/92 139/88  Pulse: 84 78 71 73  Resp: '16 20 20 16  '$ Temp: 98.5 F (36.9 C) 99.1 F (37.3 C) 97.9 F (36.6 C) 98.6 F (37 C)  TempSrc:      SpO2: 98% 98% 99% 100%  Weight:      Height:        Physical Exam: Incision copated.  Mild fluctuance with pressure.  Central wound opened and serous/bloody drainage expressed.  No pus.  Laboratory CBC    Component Value Date/Time   WBC 9.3 05/10/2021 0500   HGB 12.7 (L) 05/10/2021 0500   HCT 35.6 (L) 05/10/2021 0500   PLT 222 05/10/2021 0500    BMET    Component Value Date/Time   NA 133 (L) 05/11/2021 0618   K 3.5 05/11/2021 0618   CL 102 05/11/2021 0618   CO2 27 05/11/2021 0618   GLUCOSE 126 (H) 05/11/2021 0618   BUN 8 05/11/2021 0618   CREATININE 0.58 (L) 05/11/2021 0618   CALCIUM 11.8 (H) 05/11/2021 0618   GFRNONAA >60 05/11/2021 0618   GFRAA >60 06/12/2019 1556    Assessment/Planning: S/P TMA  Dressing changed.  Recommend ID consult.  Pt will need IV abx upon d/c for 4-6 weeks. Cx growing proteus.  Sensitivities pending. C/W NWB.   Samara Deist A  05/12/2021, 5:03 PM

## 2021-05-12 NOTE — Progress Notes (Signed)
PROGRESS NOTE    [name]  [id number] DOB: Nov 22, 1965 DOA: 05/09/2021 PCP: Pcp, No    Brief Narrative:  55 y.o. Caucasian male with medical history significant for type 2 diabetes mellitus, hypertension and dyslipidemia, who presented to the emergency room with acute onset of infected right foot wound.  Due to previous history of right foot wound and osteomyelitis he had a great toe and fifth toe amputated.  Swelling has been extending to his whole foot.  His wound was initially over the plantar aspect of his distal foot and later he developed a wound over the lateral aspect.  He now notices foul-smelling drainage and increasing pain that is becoming throbbing in graded 10/10 in severity, worsening with weightbearing.  The redness and pain has been significantly worsening despite wound care that was being provided by his friend who is a retired Marine scientist.   Patient seen in consultation by podiatry.  MRI pursued with significant osteomyelitis and tissue loss.  Status post transmetatarsal amputation right foot.  Doing well.  Pain well controlled.  Will need ID consultation for IV antibiotic therapy.  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 Plan: Continue vancomycin and Zosyn for now Monitor blood and wound cultures, no growth to date ID to be consulted 8/15  Hypercalcemia Unclear etiology, possibly from bone breakdown Improved after hydration normal saline and 1 dose Lasix Plan: Monitor calcium levels At this time no indication for IV bisphosphonate  Type 2 diabetes mellitus Home sulfonylurea has been held Continue sliding-scale coverage, 3 times daily before meals and at bedtime   Hyperlipidemia PTA  statin   DVT prophylaxis: SCDs Code Status: Full Family Communication: None today Disposition Plan: Status is: Inpatient  Remains inpatient appropriate because:Inpatient level of care appropriate due to severity of illness  Dispo: The patient is from: Home              Anticipated d/c is to:  TBD              Patient currently is not medically stable to d/c.   Difficult to place patient No  Postoperative day #2 status post transmetatarsal amputation.  Remains on IV antibiotics.  Infectious disease to be consulted 8/15 for IV antibiotic therapy.     Level of care: Med-Surg  Consultants:  Podiatry  Procedures:  Transmetatarsal amputation 8/12  Antimicrobials:   IV vancomycin Zosyn   Subjective: The examined.  Pain well controlled.  Sitting comfortably in bed.  No complaints  Objective: Vitals:   05/11/21 1521 05/11/21 1933 05/12/21 0411 05/12/21 0741  BP: (!) 145/88 134/81 (!) 136/92 139/88  Pulse: 84 78 71 73  Resp: '16 20 20 16  ' Temp: 98.5 F (36.9 C) 99.1 F (37.3 C) 97.9 F (36.6 C) 98.6 F (37 C)  TempSrc:      SpO2: 98% 98% 99% 100%  Weight:      Height:        Intake/Output Summary (Last 24 hours) at 05/12/2021 1006 Last data filed at 05/12/2021 0300 Gross per 24 hour  Intake 2749.99 ml  Output 2750 ml  Net -0.01 ml   Filed Weights   05/09/21 1416 05/10/21 0008  Weight: 97.5 kg 94.3 kg    Examination:  General exam: No acute distress Respiratory system: Clear to  auscultation. Respiratory effort normal. Cardiovascular system: S1-S2 heard, regular rate and rhythm, no murmurs, no pedal edema  gastrointestinal system: Soft, nontender,, normal bowel sounds Central nervous system: Alert and oriented. No focal neurological deficits. Extremities: Symmetric 5 x 5 power. Skin: Right foot in surgical dressings.  Dressing CDI. Psychiatry: Judgement and insight appear normal. Mood & affect appropriate.     Data Reviewed: I have personally reviewed  following labs and imaging studies  CBC: Recent Labs  Lab 05/09/21 1425 05/10/21 0500  WBC 12.8* 9.3  NEUTROABS 10.3*  --   HGB 15.0 12.7*  HCT 42.3 35.6*  MCV 93.8 94.4  PLT 244 381   Basic Metabolic Panel: Recent Labs  Lab 05/09/21 1425 05/10/21 0500 05/11/21 0618  NA 130* 134* 133*  K 3.4* 3.1* 3.5  CL 96* 102 102  CO2 '31 27 27  ' GLUCOSE 157* 138* 126*  BUN '12 9 8  ' CREATININE 0.70 0.66 0.58*  CALCIUM 13.5* 12.8* 11.8*   GFR: Estimated Creatinine Clearance: 124.7 mL/min (A) (by C-G formula based on SCr of 0.58 mg/dL (L)). Liver Function Tests: Recent Labs  Lab 05/09/21 1425  AST 52*  ALT 46*  ALKPHOS 114  BILITOT 1.3*  PROT 7.2  ALBUMIN 2.9*   No results for input(s): LIPASE, AMYLASE in the last 168 hours. No results for input(s): AMMONIA in the last 168 hours. Coagulation Profile: Recent Labs  Lab 05/09/21 1425 05/10/21 0500  INR 1.2 1.2   Cardiac Enzymes: No results for input(s): CKTOTAL, CKMB, CKMBINDEX, TROPONINI in the last 168 hours. BNP (last 3 results) No results for input(s): PROBNP in the last 8760 hours. HbA1C: Recent Labs    05/10/21 0500  HGBA1C 6.4*   CBG: Recent Labs  Lab 05/11/21 0726 05/11/21 1127 05/11/21 1623 05/11/21 1934 05/12/21 0822  GLUCAP 120* 151* 144* 168* 171*   Lipid Profile: No results for input(s): CHOL, HDL, LDLCALC, TRIG, CHOLHDL, LDLDIRECT in the last 72 hours. Thyroid Function Tests: No results for input(s): TSH, T4TOTAL, FREET4, T3FREE, THYROIDAB in the last 72 hours. Anemia Panel: No results for input(s): VITAMINB12, FOLATE, FERRITIN, TIBC, IRON, RETICCTPCT in the last 72 hours. Sepsis Labs: Recent Labs  Lab 05/09/21 1425 05/10/21 0500  PROCALCITON  --  3.18  LATICACIDVEN 1.9  --     Recent Results (from the past 240 hour(s))  Culture, blood (Routine x 2)     Status: None (Preliminary result)   Collection Time: 05/09/21  2:25 PM   Specimen: BLOOD  Result Value Ref Range Status   Specimen  Description BLOOD BLOOD RIGHT FOREARM  Final   Special Requests   Final    BOTTLES DRAWN AEROBIC AND ANAEROBIC Blood Culture adequate volume   Culture   Final    NO GROWTH 3 DAYS Performed at [facility], [address], [place], [place] [address]    Report Status PENDING  Incomplete  SARS CORONAVIRUS 2 (TAT 6-24 HRS) Nasopharyngeal Nasopharyngeal Swab     Status: None   Collection Time: 05/09/21 10:05 PM   Specimen: Nasopharyngeal Swab  Result Value Ref Range Status   SARS Coronavirus 2 NEGATIVE NEGATIVE Final    Comment: (NOTE) SARS-CoV-2 target nucleic acids are NOT DETECTED.  The SARS-CoV-2 RNA is generally detectable in upper and lower respiratory specimens during the acute phase of infection. Negative results do not preclude SARS-CoV-2 infection, do not rule out co-infections with other pathogens, and should not be used as the sole basis for treatment or other patient management decisions.  Negative results must be combined with clinical observations, patient history, and epidemiological information. The expected result is Negative.  Fact Sheet for Patients: SugarRoll.be  Fact Sheet for Healthcare Providers: https://www.woods-mathews.com/  This test is not yet approved or cleared by the [place] FDA and  has been authorized for detection and/or diagnosis of SARS-CoV-2 by FDA under an Emergency Use Authorization (EUA). This EUA will remain  in effect (meaning this test can be used) for the duration of the COVID-19 declaration under Se ction 564(b)(1) of the Act, 21 U.S.C. section 360bbb-3(b)(1), unless the authorization is terminated or revoked sooner.  Performed at [facility], [place] [address] [place], [place] [address]   Surgical PCR screen     Status: None   Collection Time: 05/10/21  2:29 AM   Specimen: Nasal Mucosa; Nasal Swab  Result Value Ref Range Status   MRSA, PCR NEGATIVE NEGATIVE Final    Staphylococcus aureus NEGATIVE NEGATIVE Final    Comment: (NOTE) The Xpert SA Assay (FDA approved for NASAL specimens in patients 75 years of age and older), is one component of a comprehensive surveillance program. It is not intended to diagnose infection nor to guide or monitor treatment. Performed at [facility], [address] [place], [place] [address]   Culture, blood (Routine X 2) w Reflex to ID Panel     Status: None (Preliminary result)   Collection Time: 05/10/21  5:00 AM   Specimen: BLOOD  Result Value Ref Range Status   Specimen Description BLOOD BLOOD LEFT HAND  Final   Special Requests   Final    BOTTLES DRAWN AEROBIC AND ANAEROBIC Blood Culture adequate volume   Culture   Final    NO GROWTH 2 DAYS Performed at [facility], [address], [place], [place] [address]    Report Status PENDING  Incomplete  Aerobic/Anaerobic Culture w Gram Stain (surgical/deep wound)     Status: None (Preliminary result)   Collection Time: 05/10/21 11:13 AM   Specimen: PATH Other; Tissue  Result Value Ref Range Status   Specimen Description   Final    WOUND Performed at [facility], [address], [place], [place] [address]    Special Requests   Final    RIGHT TOE Performed at [facility], [address] [place], [place] [address]    Gram Stain   Final    NO ORGANISMS SEEN MODERATE GRAM POSITIVE COCCI FEW GRAM NEGATIVE RODS    Culture   Final    CULTURE REINCUBATED FOR BETTER GROWTH Performed at [facility], [place] [address] [place], [place] [address]    Report Status PENDING  Incomplete         Radiology Studies: DG MINI C-ARM IMAGE ONLY  Result Date: 05/10/2021 There is no interpretation for this exam.  This order is for images obtained during a surgical procedure.  Please See "Surgeries" Tab for more information regarding the procedure.        Scheduled Meds:  atorvastatin  40 mg Oral QHS   insulin aspart   0-15 Units Subcutaneous TID WC   mupirocin ointment  1 application Nasal BID   Continuous Infusions:  sodium chloride Stopped (05/11/21 1325)   piperacillin-tazobactam (ZOSYN)  IV 3.375 g (05/12/21 0511)   vancomycin 2,000 mg (05/11/21 2214)     LOS: 3 days    Time spent: 15 minutes    Sidney Ace, MD [facility] Pager [phone]  If 7PM-7AM,  please contact night-coverage 05/12/2021, 10:06 AM

## 2021-05-12 NOTE — Plan of Care (Signed)
No acute events during the night. VSS. IV abx administered per orders. NWB to RLE. Pain well managed. Safety maintained. Bed low. Wheels locked. Denies needs at this time.  Problem: Education: Goal: Knowledge of General Education information will improve Description: Including pain rating scale, medication(s)/side effects and non-pharmacologic comfort measures Outcome: Progressing   Problem: Health Behavior/Discharge Planning: Goal: Ability to manage health-related needs will improve Outcome: Progressing   Problem: Clinical Measurements: Goal: Will remain free from infection Outcome: Progressing Goal: Diagnostic test results will improve Outcome: Progressing   Problem: Activity: Goal: Risk for activity intolerance will decrease Outcome: Progressing   Problem: Pain Managment: Goal: General experience of comfort will improve Outcome: Progressing   Problem: Safety: Goal: Ability to remain free from injury will improve Outcome: Progressing   Problem: Skin Integrity: Goal: Risk for impaired skin integrity will decrease Outcome: Progressing   Problem: Clinical Measurements: Goal: Ability to avoid or minimize complications of infection will improve Outcome: Progressing   Problem: Skin Integrity: Goal: Skin integrity will improve Outcome: Progressing

## 2021-05-13 ENCOUNTER — Encounter: Payer: Self-pay | Admitting: Podiatry

## 2021-05-13 DIAGNOSIS — L089 Local infection of the skin and subcutaneous tissue, unspecified: Secondary | ICD-10-CM

## 2021-05-13 DIAGNOSIS — E11628 Type 2 diabetes mellitus with other skin complications: Secondary | ICD-10-CM

## 2021-05-13 LAB — VANCOMYCIN, PEAK: Vancomycin Pk: 31 ug/mL (ref 30–40)

## 2021-05-13 LAB — CREATININE, SERUM
Creatinine, Ser: 0.73 mg/dL (ref 0.61–1.24)
GFR, Estimated: >60 mL/min (ref >60)

## 2021-05-13 LAB — GLUCOSE, CAPILLARY
Glucose-Capillary: 150 mg/dL — ABNORMAL HIGH (ref 70–99)
Glucose-Capillary: 160 mg/dL — ABNORMAL HIGH (ref 70–99)
Glucose-Capillary: 142 mg/dL — ABNORMAL HIGH (ref 70–99)

## 2021-05-13 LAB — VANCOMYCIN, TROUGH: Vancomycin Tr: 17 ug/mL (ref 15–20)

## 2021-05-13 NOTE — Progress Notes (Signed)
Occupational Therapy Treatment Patient Details Name: [name] MRN: [id number] DOB: [birth year] Today's Date: 05/13/2021    History of present illness 55 y.o. Caucasian male with medical history significant for type 2 diabetes mellitus, hypertension and dyslipidemia, who presented to the emergency room with acute onset of infected right foot wound.  Due to previous history of right foot wound and osteomyelitis he had a great toe and fifth toe amputated.  Swelling has been extending to his whole foot.  His wound was initially over the plantar aspect of his distal foot and later he developed a wound over the lateral aspect.  He now notices foul-smelling drainage and increasing pain that is becoming throbbing in graded 10/10 in severity, worsening with weightbearing.  The redness and pain has been significantly worsening despite wound care that was being provided by his friend who is a retired [employer] scientist. Surgery yesterday by Dr Vickki Muff -Transmetatarsal amputation right forefoot   OT comments  Pt seen for OT tx this date. Pt received in the recliner, agreeable to session. Pt performed STS transfer + RW and heavy BUE support requiring only CGA + increased time/effort. Pt negotiated obstacles in the room with BUE support on RW while maintaining NWBing through RLE to get from the recliner to the sink with no LOB, no VC for safety required, and CGA. Pt tolerated standing at sink within RW frame + VC for elbow WBing as needed to support balance while keeping RLE NWBing, pt able to wash his hands at the sink. Once returned to recliner, pt endorsed 6/10 percieved rate of exertion with handwashing at sink and the mobility aspect. Pt progressing well towards goals, demonstrates improvements this date in ADL and mobility performance. Discharge recommendations updated to reflect progress.   Patient suffers from transmet amputation on R foot which impairs his ability to perform daily activities like toileting, feeding,  dressing, grooming, bathing in the home. A cane, walker, crutch will not resolve the patient's issue with performing activities of daily living. A lightweight wheelchair and cushion is required/recommended and will allow patient to safely perform daily activities.   Patient can safely propel the wheelchair in the home or has a caregiver who can provide assistance.     Follow Up Recommendations  Home health OT    Equipment Recommendations  3 in 1 bedside commode;Tub/shower bench;Toilet rise with handles;Wheelchair cushion (measurements OT);Wheelchair (measurements OT)    Recommendations for Other Services      Precautions / Restrictions Precautions Precautions: Fall Restrictions Weight Bearing Restrictions: Yes RLE Weight Bearing: Non weight bearing Other Position/Activity Restrictions: minimal weight bearing thru heal with transfer and balance       Mobility Bed Mobility               General bed mobility comments: NT, received in recliner at start and left in recliner at end of session    Transfers Overall transfer level: Needs assistance Equipment used: Rolling walker (2 wheeled) Transfers: Sit to/from Stand Sit to Stand: Min guard         General transfer comment: CGA for STS from recliner with BUE support on arm rests of recliner before trnasferring to RW    Balance Overall balance assessment: Needs assistance Sitting-balance support: Feet supported Sitting balance-Leahy Scale: Good     Standing balance support: Bilateral upper extremity supported Standing balance-Leahy Scale: Poor Standing balance comment: reliant on RW through heavy BUE support on RW to maintain balance; UE support on counter top to wash hands  ADL either performed or assessed with clinical judgement   ADL Overall ADL's : Needs assistance/impaired     Grooming: Wash/dry hands;Standing;Min guard;Cueing for compensatory techniques Grooming Details  (indicate cue type and reason): Pt tolerated standing at sink within RW frame and + VC for elbow WBing as needed to support balance while keeping RLE NWBing, pt able to wash his hands at the sink.                                     Vision       Perception     Praxis      Cognition Arousal/Alertness: Awake/alert Behavior During Therapy: WFL for tasks assessed/performed Overall Cognitive Status: Within Functional Limits for tasks assessed                                          Exercises Other Exercises Other Exercises: Pt negotiated obstacles in the room with BUE support on RW while maintaining NWBing through RLE to get from the recliner to the sink with no LOB, no VC for safety required, and CGA. Pt endorsed 6/10 percieved rate of exertion with handwashing at sink and the mobility aspect   Shoulder Instructions       General Comments      Pertinent Vitals/ Pain       Pain Assessment: 0-10 Pain Score: 2  Pain Location: R foot Pain Descriptors / Indicators: Aching Pain Intervention(s): Limited activity within patient's tolerance;Monitored during session;Repositioned  Home Living                                          Prior Functioning/Environment              Frequency  Min 2X/week        Progress Toward Goals  OT Goals(current goals can now be found in the care plan section)  Progress towards OT goals: Progressing toward goals  Acute Rehab OT Goals Patient Stated Goal: Want to be able to walk and get around safety OT Goal Formulation: With patient Time For Goal Achievement: 05/25/21 Potential to Achieve Goals: Good  Plan Frequency remains appropriate;Discharge plan needs to be updated    Co-evaluation                 AM-PAC OT "6 Clicks" Daily Activity     Outcome Measure   Help from another person eating meals?: None Help from another person taking care of personal grooming?: A  Little Help from another person toileting, which includes using toliet, bedpan, or urinal?: A Little Help from another person bathing (including washing, rinsing, drying)?: A Little Help from another person to put on and taking off regular upper body clothing?: None Help from another person to put on and taking off regular lower body clothing?: A Little 6 Click Score: 20    End of Session Equipment Utilized During Treatment: Rolling walker  OT Visit Diagnosis: Unsteadiness on feet (R26.81);Other abnormalities of gait and mobility (R26.89);Muscle weakness (generalized) (M62.81);Pain Pain - Right/Left: Right Pain - part of body: Ankle and joints of foot   Activity Tolerance Patient tolerated treatment well   Patient Left in chair;with call bell/phone within reach;with chair alarm set;Other (  comment) (BLE elevated)   Nurse Communication          [id number] OT Time Calculation (min): 10 min  Charges: OT General Charges $OT Visit: 1 Visit OT Treatments $Self Care/Home Management : 8-22 mins  Hanley Hays, MPH, MS, OTR/L ascom [phone] 05/13/21, 3:23 PM

## 2021-05-13 NOTE — Progress Notes (Signed)
OT Cancellation Note  Patient Details Name: [name] MRN: [id number] DOB: April 18, 1966   Cancelled Treatment:    Reason Eval/Treat Not Completed: Other (comment). Upon attempt, pt with MD assessing incision/foot. Will re-attempt OT tx at later date/time as pt is available and medically appropriate.   Hanley Hays, MPH, MS, OTR/L ascom [phone] 05/13/21, 1:31 PM

## 2021-05-13 NOTE — Consult Note (Signed)
NAME: [name]  DOB: [birth year]  MRN: [id number]  Date/Time: 05/13/2021 1:12 PM  REQUESTING PROVIDER: Priscella Mann Subjective:  REASON FOR CONSULT: DFI ? [name] is a 55 y.o.male  with a history of diabetes mellitus, hypertension admitted with right foot infection on 05/09/21 Patient has a history of right fifth toe amputation due to infection in 2020.  And in October 2021 he was admitted with right great toe infection and underwent amputation.  Initially he was on Vanco and Unasyn and then was discharged on p.o. antibiotic which was Augmentin and Bactrim.  He had done well following that surgery and the wound had healed completely. 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  He did not take any antibiotics.  He had to come to the ED on 05/09/2021 as the infection was getting worse.  Vitals in the ED 124/77, temperature 97.8, heart rate 76, sats 96%. WBC 12.8, Hb 15, platelet 244 and creatinine 0.70.  Blood cultures were sent and he was started on vancomycin and Zosyn.  He was seen by podiatrist.  He was taken for transmetatarsal amputation on 05/10/2021. Am asked to see the patient for antibiotic management. Patient says he is feeling better    Past Medical History:  Diagnosis Date   Diabetes [facility])    Hypertension     Past Surgical History:  Procedure Laterality Date   AMPUTATION TOE Right 07/11/2020   Procedure: AMPUTATION TOE-Right Great Toe;  Surgeon: Samara Deist, DPM;  Location: [facility] ORS;  Service: Podiatry;  Laterality: Right;   IRRIGATION AND DEBRIDEMENT ABSCESS Right 10/31/2018   Procedure: IRRIGATION AND DEBRIDEMENT ABSCESS;  Surgeon: Samara Deist, DPM;  Location: [facility] ORS;  Service: Podiatry;  Laterality: Right;   IRRIGATION AND DEBRIDEMENT FOOT Right 11/02/2018   Procedure: IRRIGATION AND  DEBRIDEMENT FOOT;  Surgeon: Sharlotte Alamo, DPM;  Location: [facility] ORS;  Service: Podiatry;  Laterality: Right;   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   Procedure: TRANSMETATARSAL AMPUTATION;  Surgeon: Samara Deist, DPM;  Location: [facility] ORS;  Service: Podiatry;  Laterality: Right;    Social History   Socioeconomic History   Marital status: Single    Spouse name: Not on file   Number of children: Not on file   Years of education: Not on file   Highest education level: Not on file  Occupational History   Not on file  Tobacco Use   Smoking status: Light Smoker    Types: Cigars   Smokeless tobacco: Never  Vaping Use   Vaping Use: Never used  Substance and Sexual Activity   Alcohol use: Yes    Comment: "too much"    Drug use: Never   Sexual activity: Yes  Other Topics Concern   Not on file  Social History Narrative   Not on file   Social Determinants of Health   Financial Resource Strain: Not on file  Food Insecurity: Not on file  Transportation Needs: Not on file  Physical Activity: Not on file  Stress: Not on file  Social Connections: Not on file  Intimate Partner Violence: Not on file    Family History  Problem Relation Age of Onset   CAD Father    CAD Brother    No Known Allergies I? Current Facility-Administered Medications  Medication Dose Route Frequency Provider Last  Rate Last Admin   0.9 %  sodium chloride infusion   Intravenous PRN Ralene Muskrat B, MD 10 mL/hr at 05/13/21 1202 1,000 mL at 05/13/21 1202   acetaminophen (TYLENOL) tablet 650 mg  650 mg Oral Q6H PRN Samara Deist, DPM       Or   acetaminophen (TYLENOL) suppository 650 mg  650 mg Rectal Q6H PRN Samara Deist, DPM       atorvastatin (LIPITOR) tablet 40 mg  40 mg Oral QHS Samara Deist, DPM   40 mg at 05/12/21 2307   insulin aspart (novoLOG) injection 0-15 Units  0-15 Units Subcutaneous TID WC Ralene Muskrat B, MD   3 Units at 05/13/21 1159   magnesium hydroxide (MILK OF MAGNESIA)  suspension 30 mL  30 mL Oral Daily PRN Samara Deist, DPM   30 mL at 05/12/21 0831   morphine 2 MG/ML injection 2 mg  2 mg Intravenous Q4H PRN Ralene Muskrat B, MD       mupirocin ointment (BACTROBAN) 2 % 1 application  1 application Nasal BID Samara Deist, DPM   1 application at [id number]   ondansetron (ZOFRAN) tablet 4 mg  4 mg Oral Q6H PRN Samara Deist, DPM       Or   ondansetron [facility]) injection 4 mg  4 mg Intravenous Q6H PRN Samara Deist, DPM       oxyCODONE (Oxy IR/ROXICODONE) immediate release tablet 5 mg  5 mg Oral Q4H PRN Sreenath, Sudheer B, MD       piperacillin-tazobactam (ZOSYN) IVPB 3.375 g  3.375 g Intravenous Q8H Samara Deist, DPM 12.5 mL/hr at 05/13/21 0627 3.375 g at 05/13/21 [id number]   traZODone (DESYREL) tablet 25 mg  25 mg Oral QHS PRN Samara Deist, DPM       vancomycin Alcus Dad) IVPB 2000 mg/400 mL  2,000 mg Intravenous Q12H Rauer, Forde Dandy, RPH 200 mL/hr at 05/13/21 1202 Restarted at 05/13/21 1202     Abtx:  Anti-infectives (From admission, onward)    Start     Dose/Rate Route Frequency Ordered Stop   05/10/21 2300  vancomycin (VANCOREADY) IVPB 2000 mg/400 mL        2,000 mg 200 mL/hr over 120 Minutes Intravenous Every 12 hours 05/10/21 2041     05/10/21 1125  vancomycin (VANCOCIN) powder  Status:  Discontinued          As needed 05/10/21 1125 05/10/21 1152   05/10/21 0800  vancomycin (VANCOREADY) IVPB 2000 mg/400 mL  Status:  Discontinued        2,000 mg 200 mL/hr over 120 Minutes Intravenous Every 12 hours 05/09/21 2008 05/10/21 2041   05/10/21 0600  piperacillin-tazobactam (ZOSYN) IVPB 3.375 g        3.375 g 12.5 mL/hr over 240 Minutes Intravenous Every 8 hours 05/09/21 2000     05/09/21 2000  vancomycin (VANCOCIN) IVPB 1000 mg/200 mL premix  Status:  Discontinued        1,000 mg 200 mL/hr over 60 Minutes Intravenous  Once 05/09/21 1954 05/09/21 1959   05/09/21 2000  piperacillin-tazobactam (ZOSYN) IVPB 3.375 g  Status:  Discontinued         3.375 g 100 mL/hr over 30 Minutes Intravenous Every 6 hours 05/09/21 1954 05/09/21 1959   05/09/21 2000  vancomycin (VANCOCIN) IVPB 1000 mg/200 mL premix  Status:  Discontinued        1,000 mg 200 mL/hr over 60 Minutes Intravenous  Once 05/09/21 1955 05/09/21 1959   05/09/21 2000  piperacillin-tazobactam (ZOSYN)  IVPB 3.375 g  Status:  Discontinued        3.375 g 100 mL/hr over 30 Minutes Intravenous Every 6 hours 05/09/21 1955 05/09/21 1959   05/09/21 1800  piperacillin-tazobactam (ZOSYN) IVPB 3.375 g        3.375 g 100 mL/hr over 30 Minutes Intravenous  Once 05/09/21 1746 05/09/21 1843   05/09/21 1800  vancomycin (VANCOREADY) IVPB 2000 mg/400 mL        2,000 mg 200 mL/hr over 120 Minutes Intravenous  Once 05/09/21 1746 05/09/21 2131       REVIEW OF SYSTEMS:  Const: negative fever, negative chills, negative weight loss Eyes: negative diplopia or visual changes, negative eye pain ENT: negative coryza, negative sore throat Resp: negative cough, hemoptysis, dyspnea Cards: negative for chest pain, palpitations, lower extremity edema GU: negative for frequency, dysuria and hematuria GI: Negative for abdominal pain, diarrhea, bleeding, constipation Skin: negative for rash and pruritus Heme: negative for easy bruising and gum/nose bleeding MS: rt foot pain  Neurolo:negative for headaches, dizziness, vertigo, memory problems  Psych: negative for feelings of anxiety, depression  Endocrine:  diabetes Allergy/Immunology- negative for any medication or food allergies ? Objective:  VITALS:  BP 129/90 (BP Location: Left Arm)   Pulse 72   Temp 98.2 F (36.8 C)   Resp 15   Ht '6\' 3"'$  (1.905 m)   Wt 94.3 kg   SpO2 100%   BMI 25.98 kg/m  PHYSICAL EXAM:  General: Alert, cooperative, no distress, appears stated age.  Head: Normocephalic, without obvious abnormality, atraumatic. Eyes: Conjunctivae clear, anicteric sclerae. Pupils are equal ENT Nares normal. No drainage or sinus  tenderness. Lips, mucosa, and tongue normal. No Thrush Neck: Supple, symmetrical, no adenopathy, thyroid: non tender no carotid bruit and no JVD. Back: No CVA tenderness. Lungs: Clear to auscultation bilaterally. No Wheezing or Rhonchi. No rales. Heart: Regular rate and rhythm, no murmur, rub or gallop. Abdomen: Soft, non-tender,not distended. Bowel sounds normal. No masses Extremities:         Pictures reviewed.  Dressing not removed. Skin: No rashes or lesions. Or bruising Lymph: Cervical, supraclavicular normal. Neurologic: Grossly non-focal Pertinent Labs Lab Results CBC    Component Value Date/Time   WBC 9.3 05/10/2021 0500   RBC 3.77 (L) 05/10/2021 0500   HGB 12.7 (L) 05/10/2021 0500   HCT 35.6 (L) 05/10/2021 0500   PLT 222 05/10/2021 0500   MCV 94.4 05/10/2021 0500   MCH 33.7 05/10/2021 0500   MCHC 35.7 05/10/2021 0500   RDW 11.9 05/10/2021 0500   LYMPHSABS 1.3 05/09/2021 1425   MONOABS 1.0 05/09/2021 1425   EOSABS 0.0 05/09/2021 1425   BASOSABS 0.1 05/09/2021 1425    CMP Latest Ref Rng & Units 05/13/2021 05/11/2021 05/10/2021  Glucose 70 - 99 mg/dL - 126(H) 138(H)  BUN 6 - 20 mg/dL - 8 9  Creatinine 0.61 - 1.24 mg/dL 0.73 0.58(L) 0.66  Sodium 135 - 145 mmol/L - 133(L) 134(L)  Potassium 3.5 - 5.1 mmol/L - 3.5 3.1(L)  Chloride 98 - 111 mmol/L - 102 102  CO2 22 - 32 mmol/L - 27 27  Calcium 8.9 - 10.3 mg/dL - 11.8(H) 12.8(H)  Total Protein 6.5 - 8.1 g/dL - - -  Total Bilirubin 0.3 - 1.2 mg/dL - - -  Alkaline Phos 38 - 126 U/L - - -  AST 15 - 41 U/L - - -  ALT 0 - 44 U/L - - -      Microbiology: Recent Results (from  the past 240 hour(s))  Culture, blood (Routine x 2)     Status: None (Preliminary result)   Collection Time: 05/09/21  2:25 PM   Specimen: BLOOD  Result Value Ref Range Status   Specimen Description BLOOD BLOOD RIGHT FOREARM  Final   Special Requests   Final    BOTTLES DRAWN AEROBIC AND ANAEROBIC Blood Culture adequate volume   Culture    Final    NO GROWTH 3 DAYS Performed at [facility], [address], [place], [place] [address]    Report Status PENDING  Incomplete  SARS CORONAVIRUS 2 (TAT 6-24 HRS) Nasopharyngeal Nasopharyngeal Swab     Status: None   Collection Time: 05/09/21 10:05 PM   Specimen: Nasopharyngeal Swab  Result Value Ref Range Status   SARS Coronavirus 2 NEGATIVE NEGATIVE Final    Comment: (NOTE) SARS-CoV-2 target nucleic acids are NOT DETECTED.  The SARS-CoV-2 RNA is generally detectable in upper and lower respiratory specimens during the acute phase of infection. Negative results do not preclude SARS-CoV-2 infection, do not rule out co-infections with other pathogens, and should not be used as the sole basis for treatment or other patient management decisions. Negative results must be combined with clinical observations, patient history, and epidemiological information. The expected result is Negative.  Fact Sheet for Patients: SugarRoll.be  Fact Sheet for Healthcare Providers: https://www.woods-mathews.com/  This test is not yet approved or cleared by the [place] FDA and  has been authorized for detection and/or diagnosis of SARS-CoV-2 by FDA under an Emergency Use Authorization (EUA). This EUA will remain  in effect (meaning this test can be used) for the duration of the COVID-19 declaration under Se ction 564(b)(1) of the Act, 21 U.S.C. section 360bbb-3(b)(1), unless the authorization is terminated or revoked sooner.  Performed at [facility], [place] [address] [place], [place] [address]   Surgical PCR screen     Status: None   Collection Time: 05/10/21  2:29 AM   Specimen: Nasal Mucosa; Nasal Swab  Result Value Ref Range Status   MRSA, PCR NEGATIVE NEGATIVE Final   Staphylococcus aureus NEGATIVE NEGATIVE Final    Comment: (NOTE) The Xpert SA Assay (FDA approved for NASAL specimens in patients 16 years of age and older),  is one component of a comprehensive surveillance program. It is not intended to diagnose infection nor to guide or monitor treatment. Performed at [facility], [address] [place], [place] [address]   Culture, blood (Routine X 2) w Reflex to ID Panel     Status: None (Preliminary result)   Collection Time: 05/10/21  5:00 AM   Specimen: BLOOD  Result Value Ref Range Status   Specimen Description BLOOD BLOOD LEFT HAND  Final   Special Requests   Final    BOTTLES DRAWN AEROBIC AND ANAEROBIC Blood Culture adequate volume   Culture   Final    NO GROWTH 2 DAYS Performed at [facility], [address], [place], [place] [address]    Report Status PENDING  Incomplete  Aerobic/Anaerobic Culture w Gram Stain (surgical/deep wound)     Status: None (Preliminary result)   Collection Time: 05/10/21 11:13 AM   Specimen: PATH Other; Tissue  Result Value Ref Range Status   Specimen Description   Final    WOUND Performed at [facility], [address] [place],  [address]    Special Requests   Final    RIGHT TOE Performed at [facility], [place] [address] [place], [place] [address]    Gram Stain   Final    NO ORGANISMS SEEN MODERATE GRAM POSITIVE COCCI FEW GRAM NEGATIVE RODS    Culture   Final    RARE PROTEUS MIRABILIS WITH IN MIXED ORGANISMS Performed at [facility], [place] [address], [place], [place] [address]    Report Status PENDING  Incomplete   Organism ID, Bacteria PROTEUS MIRABILIS  Final      Susceptibility   Proteus mirabilis - MIC*    AMPICILLIN <=2 SENSITIVE Sensitive     CEFAZOLIN 8 SENSITIVE Sensitive     CEFEPIME <=0.12 SENSITIVE Sensitive     CEFTAZIDIME <=1 SENSITIVE Sensitive     CEFTRIAXONE <=0.25 SENSITIVE Sensitive     CIPROFLOXACIN <=0.25 SENSITIVE Sensitive     GENTAMICIN <=1 SENSITIVE Sensitive     IMIPENEM 2 SENSITIVE Sensitive     TRIMETH/SULFA <=20 SENSITIVE Sensitive     AMPICILLIN/SULBACTAM <=2  SENSITIVE Sensitive     PIP/TAZO <=4 SENSITIVE Sensitive     * RARE PROTEUS MIRABILIS    IMAGING RESULTS: MRI of the right foot showed substantial osteomyelitis of the forefoot most tracking involving the second metatarsal and second toe. I have personally reviewed the films ? Impression/Recommendation ? Diabetic foot infection of the right foot.  Previously had fifth toe removal and right great toe amputation.  Now developed a new wound and that progressed to osteomyelitis of the second toe and he underwent a transmetatarsal amputation on 05/10/2021.  Patient is currently on vancomycin and Zosyn.  The wound cultures are growing Proteus.  There could be other bacteria.  No MRSA seen.  So can discontinue vancomycin. Dr. Caryl Comes had seen the patient and he thinks he may need further washout.  He will discuss with Dr. Vickki Muff. Pathology still pending. Very likely this patient will need IV antibiotics.  Will discuss with lab to see whether there were any other organisms in the wound.  Diabetes mellitus.  On insulin   hyperlipidemia on atorvastatin. ? ? ___________________________________________________ Discussed with patient, Note:  This document was prepared using Dragon voice recognition software and may include unintentional dictation errors.

## 2021-05-13 NOTE — Plan of Care (Signed)
No acute events during the night. VSS. FSBS within normal limits. Denies pain on this shift. Dressing to RLE is clean, dry, and intact. IV abx administered. Safety maintained.  Problem: Education: Goal: Knowledge of General Education information will improve Description: Including pain rating scale, medication(s)/side effects and non-pharmacologic comfort measures Outcome: Progressing   Problem: Health Behavior/Discharge Planning: Goal: Ability to manage health-related needs will improve Outcome: Progressing   Problem: Clinical Measurements: Goal: Will remain free from infection Outcome: Progressing Goal: Diagnostic test results will improve Outcome: Progressing   Problem: Activity: Goal: Risk for activity intolerance will decrease Outcome: Progressing   Problem: Pain Managment: Goal: General experience of comfort will improve Outcome: Progressing   Problem: Safety: Goal: Ability to remain free from injury will improve Outcome: Progressing   Problem: Skin Integrity: Goal: Risk for impaired skin integrity will decrease Outcome: Progressing   Problem: Clinical Measurements: Goal: Ability to avoid or minimize complications of infection will improve Outcome: Progressing   Problem: Skin Integrity: Goal: Skin integrity will improve Outcome: Progressing

## 2021-05-13 NOTE — TOC Initial Note (Addendum)
Transition of Care [facility]) - Initial/Assessment Note    Patient Details  Name: [name] MRN: [id number] Date of Birth: [birth year]  [facility]) CM/SW Contact:    Candie Chroman, LCSW Phone Number: 05/13/2021, 9:42 AM  Clinical Narrative:  This CSW working remote today. Called patient in the room, introduced role, and explained that PT recommendations would be discussed. Patient confirmed he does not have insurance and is unable to pay privately for SNF placement. Will need to set up charity home health prior to discharge. Patient does not have a PCP. Will need up set him up with someone prior to discharge and ask physician advisors to cover home health orders until he can be seen. They usually require appt within 30 days of discharge. Called [facility] but they are not accepting new patients at this time. [facility] are accepting new patients but do not have appointments available until end of September. Sent in basket message to program coordinator at [facility], [place], explaining situation and to see if we can get him an appointment within 30 days.                12:54 pm: Rhona Raider, RN with [facility] heads up referral. No response from [facility] yet.  Expected Discharge Plan: [place] Barriers to Discharge: Inadequate or no insurance, Continued Medical Work up, Other (must enter comment) (No PCP)   Patient Goals and CMS Choice        Expected Discharge Plan and Services Expected Discharge Plan: [place] Choice: [place] arrangements for the past 2 months: Apartment                                      Prior Living Arrangements/Services Living arrangements for the past 2 months: Apartment Lives with:: Self Patient language and need for interpreter reviewed:: Yes Do you feel safe going back to the place where you live?: Yes      Need for Family  Participation in Patient Care: Yes (Comment) Care giver support system in place?: Yes (comment)   Criminal Activity/Legal Involvement Pertinent to Current Situation/Hospitalization: No - Comment as needed  Activities of Daily Living Home Assistive Devices/Equipment: Eyeglasses ADL Screening (condition at time of admission) Patient's cognitive ability adequate to safely complete daily activities?: Yes Is the patient deaf or have difficulty hearing?: No Does the patient have difficulty seeing, even when wearing glasses/contacts?: No Does the patient have difficulty concentrating, remembering, or making decisions?: No Patient able to express need for assistance with ADLs?: Yes Does the patient have difficulty dressing or bathing?: No Independently performs ADLs?: Yes (appropriate for developmental age) Does the patient have difficulty walking or climbing stairs?: No Weakness of Legs: Right Weakness of Arms/Hands: None  Permission Sought/Granted Permission sought to share information with : Facility Art therapist granted to share information with : Yes, Verbal Permission Granted     Permission granted to share info w AGENCY: Home health agencies, PCP offices        Emotional Assessment Appearance:: Appears stated age Attitude/Demeanor/Rapport: Engaged, Gracious Affect (typically observed): Accepting, Appropriate, Calm, Pleasant Orientation: : Oriented to Self, Oriented to Place, Oriented to  Time, Oriented to Situation Alcohol / Substance Use: Not Applicable Psych Involvement: No (comment)  Admission diagnosis:  Foot osteomyelitis, right (  [place]) [M86.9] Acute hematogenous osteomyelitis of right foot ([place]) [M86.071] Patient Active Problem List   Diagnosis Date Noted   Foot osteomyelitis, right ([place]) 05/09/2021   Hypercalcemia 07/10/2020   Type 2 diabetes mellitus with complication, without long-term current use of insulin ([place]) 07/09/2020   HTN (hypertension)  07/09/2020   Osteomyelitis of great toe of right foot ([place]) 10/30/2018   Dog bite 04/13/2018   PCP:  Pcp, No Pharmacy:   [id number] [place] [place] [place]Phone: [phone]: [phone]    Social Determinants of Health (SDOH) Interventions    Readmission Risk Interventions No flowsheet data found.

## 2021-05-13 NOTE — Progress Notes (Signed)
Physical Therapy Treatment Patient Details Name: [name] MRN: [id number] DOB: 04/16/1966 Today's Date: 05/13/2021    History of Present Illness 55 y.o. Caucasian male with medical history significant for type 2 diabetes mellitus, hypertension and dyslipidemia, who presented to the emergency room with acute onset of infected right foot wound.  Due to previous history of right foot wound and osteomyelitis he had a great toe and fifth toe amputated.  Swelling has been extending to his whole foot.  His wound was initially over the plantar aspect of his distal foot and later he developed a wound over the lateral aspect.  He now notices foul-smelling drainage and increasing pain that is becoming throbbing in graded 10/10 in severity, worsening with weightbearing.  The redness and pain has been significantly worsening despite wound care that was being provided by his friend who is a retired nurse. Surgery yesterday by Dr Fowler -Transmetatarsal amputation right forefoot    PT Comments    Stated he does not qualify for rehab due to no insurance and would be discharging home.  Session focused on wheelchair features,safety and use. He practices squat pivot transfers to/from wheelchair and also with RW.  Pt is much safer with squat pivot transfers especially when alone vs walker.  Education provided and voiced understanding.  He is able to walk to door and back NWB with RW and min guard/assist.  Encouraged +1 assist for gait at home for safety when friends/family/HHPT is in but no limit mobility at home to wheelchair level.  Voiced understanding.    While pt would benefit from SNF, he has demonstrated safe transfers and mobility at a wheelchair level.     Patient suffers trans met amputation/NWB which impairs his/her ability to perform daily activities like toileting, feeding, dressing, grooming, bathing in the home. A cane, walker, crutch will not resolve the patient's issue with performing activities of  daily living. A lightweight wheelchair and cushion is required/recommended and will allow patient to safely perform daily activities.   Patient can safely propel the wheelchair in the home or has a caregiver who can provide assistance.    Follow Up Recommendations  Home health PT     Equipment Recommendations  Wheelchair (measurements PT);Wheelchair cushion (measurements PT) (Tall Walker required.)    Recommendations for Other Services       Precautions / Restrictions Precautions Precautions: Fall Restrictions Weight Bearing Restrictions: Yes RLE Weight Bearing: Non weight bearing Other Position/Activity Restrictions: minimal weight bearing thru heal with transfer and balance    Mobility  Bed Mobility Overal bed mobility: Modified Independent                  Transfers Overall transfer level: Needs assistance Equipment used: Rolling walker (2 wheeled);None Transfers: Sit to/from Stand;Squat Pivot Transfers Sit to Stand: Min assist;Min guard   Squat pivot transfers: Supervision     General transfer comment: with RW still needs +1 for safety but does well squat pivot in and out of wheelchair  Ambulation/Gait Ambulation/Gait assistance: Min assist Gait Distance (Feet): 30 Feet Assistive device: Rolling walker (2 wheeled)   Gait velocity: Decreased   General Gait Details: hop to NWB gait to door and back   Stairs             Wheelchair Mobility Wheelchair Mobility Wheelchair mobility: Yes Wheelchair propulsion: Both upper extremities Wheelchair Assistance Details (indicate cue type and reason): education for wheelchair features, safety and mobility/use  Modified Rankin (Stroke Patients Only)         Balance Overall balance assessment: Needs assistance Sitting-balance support: Feet supported Sitting balance-Leahy Scale: Good     Standing balance support: Bilateral upper extremity supported Standing balance-Leahy Scale: Poor                               Cognition Arousal/Alertness: Awake/alert Behavior During Therapy: WFL for tasks assessed/performed Overall Cognitive Status: Within Functional Limits for tasks assessed                                        Exercises      General Comments        Pertinent Vitals/Pain Pain Assessment: No/denies pain    Home Living                      Prior Function            PT Goals (current goals can now be found in the care plan section) Progress towards PT goals: Progressing toward goals    Frequency    7X/week      PT Plan Discharge plan needs to be updated    Co-evaluation              AM-PAC PT "6 Clicks" Mobility   Outcome Measure  Help needed turning from your back to your side while in a flat bed without using bedrails?: None Help needed moving from lying on your back to sitting on the side of a flat bed without using bedrails?: None Help needed moving to and from a bed to a chair (including a wheelchair)?: A Little Help needed standing up from a chair using your arms (e.g., wheelchair or bedside chair)?: A Little Help needed to walk in hospital room?: A Little Help needed climbing 3-5 steps with a railing? : Total 6 Click Score: 18    End of Session Equipment Utilized During Treatment: Gait belt Activity Tolerance: Patient limited by fatigue Patient left: in bed;with call bell/phone within reach;with bed alarm set Nurse Communication: Mobility status PT Visit Diagnosis: Unsteadiness on feet (R26.81);Other abnormalities of gait and mobility (R26.89);Muscle weakness (generalized) (M62.81);Difficulty in walking, not elsewhere classified (R26.2)     Time: 0846-0910 PT Time Calculation (min) (ACUTE ONLY): 24 min  Charges:  $Gait Training: 8-22 mins $Therapeutic Activity: 8-22 mins                    Sarah Congdon, PTA 05/13/21, 11:00 AM , 10:56 AM 

## 2021-05-13 NOTE — Anesthesia Postprocedure Evaluation (Signed)
Anesthesia Post Note  Patient: [name]  Procedure(s) Performed: TRANSMETATARSAL AMPUTATION (Right: Toe)  Patient location during evaluation: PACU Anesthesia Type: General Level of consciousness: awake and alert Pain management: pain level controlled Vital Signs Assessment: post-procedure vital signs reviewed and stable Respiratory status: spontaneous breathing, nonlabored ventilation, respiratory function stable and patient connected to nasal cannula oxygen Cardiovascular status: blood pressure returned to baseline and stable Postop Assessment: no apparent nausea or vomiting Anesthetic complications: no   No notable events documented.   Last Vitals:  Vitals:   05/13/21 1138 05/13/21 1540  BP: 129/90 (!) 143/90  Pulse: 72 74  Resp: 15 15  Temp: 36.8 C 36.9 C  SpO2: 100% 100%    Last Pain:  Vitals:   05/13/21 1033  TempSrc:   PainSc: 0-No pain                 Molli Barrows

## 2021-05-13 NOTE — Progress Notes (Signed)
3 Days Post-Op   Subjective/Chief Complaint: Patient seen.  In general no complaints.   Objective: Vital signs in last 24 hours: Temp:  [97.9 F (36.6 C)-98.6 F (37 C)] 98.2 F (36.8 C) (08/15 1138) Pulse Rate:  [64-75] 72 (08/15 1138) Resp:  [15-18] 15 (08/15 1138) BP: (129-149)/(84-90) 129/90 (08/15 1138) SpO2:  [99 %-100 %] 100 % (08/15 1138) Last BM Date: 05/13/21  Intake/Output from previous day: 08/14 0701 - 08/15 0700 In: 600 [P.O.:600] Out: 1700 [Urine:1700] Intake/Output this shift: Total I/O In: 240 [P.O.:240] Out: 200 [Urine:200]  Some moderate drainage is noted on the bandaging.  Upon removal the distal incision is well coapted but there is a significant amount of bloody and purulent drainage expressed from the open plantar portion of the wound.   Lab Results:  No results for input(s): WBC, HGB, HCT, PLT in the last 72 hours. BMET Recent Labs    05/11/21 0618 05/13/21 0148  NA 133*  --   K 3.5  --   CL 102  --   CO2 27  --   GLUCOSE 126*  --   BUN 8  --   CREATININE 0.58* 0.73  CALCIUM 11.8*  --    PT/INR No results for input(s): LABPROT, INR in the last 72 hours. ABG No results for input(s): PHART, HCO3 in the last 72 hours.  Invalid input(s): PCO2, PO2  Studies/Results: No results found.  Anti-infectives: Anti-infectives (From admission, onward)    Start     Dose/Rate Route Frequency Ordered Stop   05/10/21 2300  vancomycin (VANCOREADY) IVPB 2000 mg/400 mL        2,000 mg 200 mL/hr over 120 Minutes Intravenous Every 12 hours 05/10/21 2041     05/10/21 1125  vancomycin (VANCOCIN) powder  Status:  Discontinued          As needed 05/10/21 1125 05/10/21 1152   05/10/21 0800  vancomycin (VANCOREADY) IVPB 2000 mg/400 mL  Status:  Discontinued        2,000 mg 200 mL/hr over 120 Minutes Intravenous Every 12 hours 05/09/21 2008 05/10/21 2041   05/10/21 0600  piperacillin-tazobactam (ZOSYN) IVPB 3.375 g        3.375 g 12.5 mL/hr over 240  Minutes Intravenous Every 8 hours 05/09/21 2000     05/09/21 2000  vancomycin (VANCOCIN) IVPB 1000 mg/200 mL premix  Status:  Discontinued        1,000 mg 200 mL/hr over 60 Minutes Intravenous  Once 05/09/21 1954 05/09/21 1959   05/09/21 2000  piperacillin-tazobactam (ZOSYN) IVPB 3.375 g  Status:  Discontinued        3.375 g 100 mL/hr over 30 Minutes Intravenous Every 6 hours 05/09/21 1954 05/09/21 1959   05/09/21 2000  vancomycin (VANCOCIN) IVPB 1000 mg/200 mL premix  Status:  Discontinued        1,000 mg 200 mL/hr over 60 Minutes Intravenous  Once 05/09/21 1955 05/09/21 1959   05/09/21 2000  piperacillin-tazobactam (ZOSYN) IVPB 3.375 g  Status:  Discontinued        3.375 g 100 mL/hr over 30 Minutes Intravenous Every 6 hours 05/09/21 1955 05/09/21 1959   05/09/21 1800  piperacillin-tazobactam (ZOSYN) IVPB 3.375 g        3.375 g 100 mL/hr over 30 Minutes Intravenous  Once 05/09/21 1746 05/09/21 1843   05/09/21 1800  vancomycin (VANCOREADY) IVPB 2000 mg/400 mL        2,000 mg 200 mL/hr over 120 Minutes Intravenous  Once 05/09/21 1746  05/09/21 2131       Assessment/Plan: s/p Procedure(s): TRANSMETATARSAL AMPUTATION (Right) Assessment: Status post transmetatarsal amputation right foot  Plan: Betadine applied to the incision area with saline wet-to-dry gauze packed within the wound followed by a bulky bandage.  I will speak with Dr. Vickki Muff about treatment plan as he may need another debridement to flush the wound out.  Waiting on pathology at this point.  LOS: 4 days    [name] 05/13/2021

## 2021-05-13 NOTE — Progress Notes (Signed)
PROGRESS NOTE    [name]  UDJ:[id number] DOB: 1966/02/04 DOA: 05/09/2021 PCP: Pcp, No    Brief Narrative:  55 y.o. Caucasian male with medical history significant for type 2 diabetes mellitus, hypertension and dyslipidemia, who presented to the emergency room with acute onset of infected right foot wound.  Due to previous history of right foot wound and osteomyelitis he had a great toe and fifth toe amputated.  Swelling has been extending to his whole foot.  His wound was initially over the plantar aspect of his distal foot and later he developed a wound over the lateral aspect.  He now notices foul-smelling drainage and increasing pain that is becoming throbbing in graded 10/10 in severity, worsening with weightbearing.  The redness and pain has been significantly worsening despite wound care that was being provided by his friend who is a retired [employer] scientist.   Patient seen in consultation by podiatry.  MRI pursued with significant osteomyelitis and tissue loss.  Status post transmetatarsal amputation right foot.  Doing well.  Pain well controlled.  Will need ID consultation for IV antibiotic therapy.  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 Wound culture with Proteus Plan: On broad-spectrum antibiotics.  Expect de-escalation today.  Infectious disease consulted.  Message sent to Dr. Delaine Lame. Patient is without insurance this does not have a PCP.  As such skilled nursing facility is not a possibility.  Discussed with TOC.  Will need to establish PCP appointment within 30 days and until that time physician advisor will need to cover home health orders.  Hypercalcemia Unclear etiology, possibly from bone breakdown Improved  after hydration normal saline and 1 dose Lasix Plan: Monitor calcium levels At this time no indication for IV bisphosphonate  Type 2 diabetes mellitus Home sulfonylurea has been held Adequate glycemic control Continue sliding-scale coverage, 3 times daily before meals and at bedtime   Hyperlipidemia PTA statin   DVT prophylaxis: SCDs Code Status: Full Family Communication: None today Disposition Plan: Status is: Inpatient  Remains inpatient appropriate because:Inpatient level of care appropriate due to severity of illness  Dispo: The patient is from: Home              Anticipated d/c is to: Home with home health              Patient currently is not medically stable to d/c.   Difficult to place patient No  Postoperative day #3 status post transmetatarsal amputation for right foot osteomyelitis.  Remains on IV antibiotics.  Infectious disease on consult.  Pending recommendations.     Level of care: Med-Surg  Consultants:  Podiatry  Procedures:  Transmetatarsal amputation 8/12  Antimicrobials:   IV vancomycin Zosyn   Subjective: Patient seen and examined.  Sitting comfortably in bed.  Pain well controlled.  No apparent distress  Objective: Vitals:   05/12/21 1722 05/12/21 1943 05/13/21 0556 05/13/21 0717  BP: (!) 149/90 136/89 134/87 129/84  Pulse: 75 72 68 64  Resp: _0 Temp: 98.3 F (36.8 C) 98.4 F (36.9 C) 97.9 F (36.6 C) 98.6 F (37 C)  TempSrc:  Oral    SpO2: 100% 100% 99% 100%  Weight:      Height:        Intake/Output Summary (Last 24  hours) at 05/13/2021 1110 Last data filed at 05/13/2021 1032 Gross per 24 hour  Intake 600 ml  Output 1500 ml  Net -900 ml   Filed Weights   05/09/21 1416 05/10/21 0008  Weight: 97.5 kg 94.3 kg    Examination:  General exam: Sitting comfortably in bed.  Pain well controlled Respiratory system: Clear to auscultation. Respiratory effort normal. Cardiovascular system: S1-S2 heard, regular rate  and rhythm, no murmurs, no pedal edema  gastrointestinal system: Soft, nontender,, normal bowel sounds Central nervous system: Alert and oriented. No focal neurological deficits. Extremities: Symmetric 5 x 5 power. Skin: Right foot in surgical shoe and dressings.  Dressing CDI Psychiatry: Judgement and insight appear normal. Mood & affect appropriate.     Data Reviewed: I have personally reviewed following labs and imaging studies  CBC: Recent Labs  Lab 05/09/21 1425 05/10/21 0500  WBC 12.8* 9.3  NEUTROABS 10.3*  --   HGB 15.0 12.7*  HCT 42.3 35.6*  MCV 93.8 94.4  PLT 244 048   Basic Metabolic Panel: Recent Labs  Lab 05/09/21 1425 05/10/21 0500 05/11/21 0618 05/13/21 0148  NA 130* 134* 133*  --   K 3.4* 3.1* 3.5  --   CL 96* 102 102  --   CO2 _0 --   GLUCOSE 157* 138* 126*  --   BUN _1 --   CREATININE 0.70 0.66 0.58* 0.73  CALCIUM 13.5* 12.8* 11.8*  --    GFR: Estimated Creatinine Clearance: 124.7 mL/min (by C-G formula based on SCr of 0.73 mg/dL). Liver Function Tests: Recent Labs  Lab 05/09/21 1425  AST 52*  ALT 46*  ALKPHOS 114  BILITOT 1.3*  PROT 7.2  ALBUMIN 2.9*   No results for input(s): LIPASE, AMYLASE in the last 168 hours. No results for input(s): AMMONIA in the last 168 hours. Coagulation Profile: Recent Labs  Lab 05/09/21 1425 05/10/21 0500  INR 1.2 1.2   Cardiac Enzymes: No results for input(s): CKTOTAL, CKMB, CKMBINDEX, TROPONINI in the last 168 hours. BNP (last 3 results) No results for input(s): PROBNP in the last 8760 hours. HbA1C: No results for input(s): HGBA1C in the last 72 hours.  CBG: Recent Labs  Lab 05/12/21 0822 05/12/21 1203 05/12/21 1650 05/12/21 2107 05/13/21 0719  GLUCAP 171* 165* 124* 134* 150*   Lipid Profile: No results for input(s): CHOL, HDL, LDLCALC, TRIG, CHOLHDL, LDLDIRECT in the last 72 hours. Thyroid Function Tests: No results for input(s): TSH, T4TOTAL, FREET4, T3FREE, THYROIDAB in  the last 72 hours. Anemia Panel: No results for input(s): VITAMINB12, FOLATE, FERRITIN, TIBC, IRON, RETICCTPCT in the last 72 hours. Sepsis Labs: Recent Labs  Lab 05/09/21 1425 05/10/21 0500  PROCALCITON  --  3.18  LATICACIDVEN 1.9  --     Recent Results (from the past 240 hour(s))  Culture, blood (Routine x 2)     Status: None (Preliminary result)   Collection Time: 05/09/21  2:25 PM   Specimen: BLOOD  Result Value Ref Range Status   Specimen Description BLOOD BLOOD RIGHT FOREARM  Final   Special Requests   Final    BOTTLES DRAWN AEROBIC AND ANAEROBIC Blood Culture adequate volume   Culture   Final    NO GROWTH 3 DAYS Performed at [facility], [address] [place], [place] [address]    Report Status PENDING  Incomplete  SARS CORONAVIRUS 2 (TAT 6-24 HRS) Nasopharyngeal Nasopharyngeal Swab     Status: None   Collection Time:  05/09/21 10:05 PM   Specimen: Nasopharyngeal Swab  Result Value Ref Range Status   SARS Coronavirus 2 NEGATIVE NEGATIVE Final    Comment: (NOTE) SARS-CoV-2 target nucleic acids are NOT DETECTED.  The SARS-CoV-2 RNA is generally detectable in upper and lower respiratory specimens during the acute phase of infection. Negative results do not preclude SARS-CoV-2 infection, do not rule out co-infections with other pathogens, and should not be used as the sole basis for treatment or other patient management decisions. Negative results must be combined with clinical observations, patient history, and epidemiological information. The expected result is Negative.  Fact Sheet for Patients: SugarRoll.be  Fact Sheet for Healthcare Providers: https://www.woods-mathews.com/  This test is not yet approved or cleared by the [place] FDA and  has been authorized for detection and/or diagnosis of SARS-CoV-2 by FDA under an Emergency Use Authorization (EUA). This EUA will remain  in effect (meaning this  test can be used) for the duration of the COVID-19 declaration under Se ction 564(b)(1) of the Act, 21 U.S.C. section 360bbb-3(b)(1), unless the authorization is terminated or revoked sooner.  Performed at [facility], [place] [address], [place], [place] [address]   Surgical PCR screen     Status: None   Collection Time: 05/10/21  2:29 AM   Specimen: Nasal Mucosa; Nasal Swab  Result Value Ref Range Status   MRSA, PCR NEGATIVE NEGATIVE Final   Staphylococcus aureus NEGATIVE NEGATIVE Final    Comment: (NOTE) The Xpert SA Assay (FDA approved for NASAL specimens in patients 19 years of age and older), is one component of a comprehensive surveillance program. It is not intended to diagnose infection nor to guide or monitor treatment. Performed at [facility], [address] [place], [place] [address]   Culture, blood (Routine X 2) w Reflex to ID Panel     Status: None (Preliminary result)   Collection Time: 05/10/21  5:00 AM   Specimen: BLOOD  Result Value Ref Range Status   Specimen Description BLOOD BLOOD LEFT HAND  Final   Special Requests   Final    BOTTLES DRAWN AEROBIC AND ANAEROBIC Blood Culture adequate volume   Culture   Final    NO GROWTH 2 DAYS Performed at [facility], [address], [place], [place] [address]    Report Status PENDING  Incomplete  Aerobic/Anaerobic Culture w Gram Stain (surgical/deep wound)     Status: None (Preliminary result)   Collection Time: 05/10/21 11:13 AM   Specimen: PATH Other; Tissue  Result Value Ref Range Status   Specimen Description   Final    WOUND Performed at [facility], [address], [place], [place] [address]    Special Requests   Final    RIGHT TOE Performed at [facility], [address] [place], [place] [address]    Gram Stain   Final    NO ORGANISMS SEEN MODERATE GRAM POSITIVE COCCI FEW GRAM NEGATIVE RODS    Culture   Final    RARE PROTEUS  MIRABILIS SUSCEPTIBILITIES TO FOLLOW Performed at [facility], [place] [address] [place], [place] [address]    Report Status PENDING  Incomplete         Radiology Studies: No results found.      Scheduled Meds:  atorvastatin  40 mg Oral QHS   insulin aspart  0-15 Units Subcutaneous TID WC   mupirocin ointment  1 application Nasal BID   Continuous Infusions:  sodium chloride 1,000  mL (05/13/21 1057)   piperacillin-tazobactam (ZOSYN)  IV 3.375 g (05/13/21 4827)   vancomycin 2,000 mg (05/13/21 1058)     LOS: 4 days    Time spent: 25 minutes    Sidney Ace, MD [facility] Pager [phone]  If 7PM-7AM, please contact night-coverage 05/13/2021, 11:10 AM

## 2021-05-14 ENCOUNTER — Encounter: Payer: Self-pay | Admitting: Family Medicine

## 2021-05-14 ENCOUNTER — Other Ambulatory Visit: Payer: Self-pay

## 2021-05-14 ENCOUNTER — Inpatient Hospital Stay: Payer: Self-pay | Admitting: Anesthesiology

## 2021-05-14 ENCOUNTER — Encounter
Admission: EM | Disposition: A | Discharge status: Home Health | Payer: Self-pay | Source: Home / Self Care | Attending: Internal Medicine

## 2021-05-14 HISTORY — PX: I & D EXTREMITY: SHX5045

## 2021-05-14 LAB — GLUCOSE, CAPILLARY
Glucose-Capillary: 172 mg/dL — ABNORMAL HIGH (ref 70–99)
Glucose-Capillary: 145 mg/dL — ABNORMAL HIGH (ref 70–99)
Glucose-Capillary: 154 mg/dL — ABNORMAL HIGH (ref 70–99)
Glucose-Capillary: 168 mg/dL — ABNORMAL HIGH (ref 70–99)
Glucose-Capillary: 190 mg/dL — ABNORMAL HIGH (ref 70–99)
Glucose-Capillary: 188 mg/dL — ABNORMAL HIGH (ref 70–99)

## 2021-05-14 LAB — CULTURE, BLOOD (ROUTINE X 2)
Culture: NO GROWTH
Special Requests: ADEQUATE

## 2021-05-14 LAB — CREATININE, SERUM
Creatinine, Ser: 0.73 mg/dL (ref 0.61–1.24)
GFR, Estimated: >60 mL/min (ref >60)

## 2021-05-14 LAB — SURGICAL PATHOLOGY

## 2021-05-14 SURGERY — IRRIGATION AND DEBRIDEMENT EXTREMITY
Anesthesia: General | Laterality: Right

## 2021-05-14 MED ORDER — ONDANSETRON HCL 4 MG/2ML IJ SOLN
INTRAMUSCULAR | Status: AC
  Filled 2021-05-14: qty 2

## 2021-05-14 MED ORDER — 0.9 % SODIUM CHLORIDE (POUR BTL) OPTIME
TOPICAL | Status: DC | PRN
  Administered 2021-05-14: 3000 mL

## 2021-05-14 MED ORDER — LIDOCAINE HCL (CARDIAC) PF 100 MG/5ML IV SOSY
PREFILLED_SYRINGE | INTRAVENOUS | Status: DC | PRN
  Administered 2021-05-14: 100 mg via INTRAVENOUS

## 2021-05-14 MED ORDER — LACTATED RINGERS IV SOLN: INTRAVENOUS | Status: DC | PRN

## 2021-05-14 MED ORDER — PIPERACILLIN-TAZOBACTAM 3.375 G IVPB
INTRAVENOUS | Status: AC
  Filled 2021-05-14: qty 50

## 2021-05-14 MED ORDER — EPHEDRINE SULFATE 50 MG/ML IJ SOLN
INTRAMUSCULAR | Status: DC | PRN
  Administered 2021-05-14: 5 mg via INTRAVENOUS

## 2021-05-14 MED ORDER — FENTANYL CITRATE (PF) 100 MCG/2ML IJ SOLN: 25.0000 ug | INTRAMUSCULAR | Status: DC | PRN

## 2021-05-14 MED ORDER — DEXAMETHASONE SODIUM PHOSPHATE 10 MG/ML IJ SOLN
INTRAMUSCULAR | Status: AC
  Filled 2021-05-14: qty 1

## 2021-05-14 MED ORDER — CHLORHEXIDINE GLUCONATE 4 % EX LIQD
60.0000 mL | Freq: Once | CUTANEOUS | Status: AC
  Administered 2021-05-14: 4 via TOPICAL

## 2021-05-14 MED ORDER — MIDAZOLAM HCL 2 MG/2ML IJ SOLN
INTRAMUSCULAR | Status: DC | PRN
  Administered 2021-05-14: 2 mg via INTRAVENOUS

## 2021-05-14 MED ORDER — MEPERIDINE HCL 25 MG/ML IJ SOLN: 6.2500 mg | INTRAMUSCULAR | Status: DC | PRN

## 2021-05-14 MED ORDER — MIDAZOLAM HCL 2 MG/2ML IJ SOLN
INTRAMUSCULAR | Status: AC
  Filled 2021-05-14: qty 2

## 2021-05-14 MED ORDER — PROPOFOL 10 MG/ML IV BOLUS
INTRAVENOUS | Status: DC | PRN
  Administered 2021-05-14: 200 mg via INTRAVENOUS

## 2021-05-14 MED ORDER — BUPIVACAINE HCL (PF) 0.5 % IJ SOLN
INTRAMUSCULAR | Status: AC
  Filled 2021-05-14: qty 30

## 2021-05-14 MED ORDER — FENTANYL CITRATE (PF) 100 MCG/2ML IJ SOLN
INTRAMUSCULAR | Status: AC
  Filled 2021-05-14: qty 2

## 2021-05-14 MED ORDER — PROPOFOL 10 MG/ML IV BOLUS
INTRAVENOUS | Status: AC
  Filled 2021-05-14: qty 40

## 2021-05-14 MED ORDER — ONDANSETRON HCL 4 MG/2ML IJ SOLN
INTRAMUSCULAR | Status: DC | PRN
  Administered 2021-05-14: 4 mg via INTRAVENOUS

## 2021-05-14 MED ORDER — FENTANYL CITRATE (PF) 100 MCG/2ML IJ SOLN
INTRAMUSCULAR | Status: DC | PRN
  Administered 2021-05-14 (×2): 25 ug via INTRAVENOUS

## 2021-05-14 MED ORDER — POVIDONE-IODINE 10 % EX SWAB: 2.0000 "application " | Freq: Once | CUTANEOUS | Status: DC

## 2021-05-14 MED ORDER — BUPIVACAINE HCL 0.5 % IJ SOLN
INTRAMUSCULAR | Status: DC | PRN
  Administered 2021-05-14: 6 mL

## 2021-05-14 MED ORDER — LIDOCAINE HCL (PF) 1 % IJ SOLN
INTRAMUSCULAR | Status: AC
  Filled 2021-05-14: qty 30

## 2021-05-14 MED ORDER — CHLORHEXIDINE GLUCONATE 0.12 % MT SOLN
OROMUCOSAL | Status: AC
  Filled 2021-05-14: qty 15

## 2021-05-14 MED ORDER — LIDOCAINE-EPINEPHRINE 1 %-1:100000 IJ SOLN
INTRAMUSCULAR | Status: AC
  Filled 2021-05-14: qty 1

## 2021-05-14 MED ORDER — SODIUM CHLORIDE 0.9 % IV SOLN
3.0000 g | Freq: Four times a day (QID) | INTRAVENOUS | Status: AC
Start: 2021-05-14 — End: 2021-05-15
  Administered 2021-05-14 – 2021-05-15 (×5): 3 g via INTRAVENOUS
  Filled 2021-05-14 (×7): qty 8
  Filled 2021-05-14: qty 3

## 2021-05-14 MED ORDER — PHENYLEPHRINE HCL (PRESSORS) 10 MG/ML IV SOLN
INTRAVENOUS | Status: DC | PRN
  Administered 2021-05-14: 100 ug via INTRAVENOUS
  Administered 2021-05-14: 50 ug via INTRAVENOUS
  Administered 2021-05-14: 100 ug via INTRAVENOUS

## 2021-05-14 SURGICAL SUPPLY — 59 items
BLADE OSC/SAGITTAL MD 5.5X18 (BLADE) IMPLANT
BLADE OSCILLATING/SAGITTAL (BLADE)
BLADE SW THK.38XMED LNG THN (BLADE) IMPLANT
BNDG COHESIVE 4X5 TAN ST LF (GAUZE/BANDAGES/DRESSINGS) ×2 IMPLANT
BNDG COHESIVE 6X5 TAN ST LF (GAUZE/BANDAGES/DRESSINGS) ×2 IMPLANT
BNDG CONFORM 2 STRL LF (GAUZE/BANDAGES/DRESSINGS) ×2 IMPLANT
BNDG CONFORM 3 STRL LF (GAUZE/BANDAGES/DRESSINGS) ×2 IMPLANT
BNDG ELASTIC 4X5.8 VLCR STR LF (GAUZE/BANDAGES/DRESSINGS) ×2 IMPLANT
BNDG ESMARK 4X12 TAN STRL LF (GAUZE/BANDAGES/DRESSINGS) ×2 IMPLANT
BNDG GAUZE ELAST 4 BULKY (GAUZE/BANDAGES/DRESSINGS) ×2 IMPLANT
CANISTER SUCT 1200ML W/VALVE (MISCELLANEOUS) ×2 IMPLANT
CUFF TOURN SGL QUICK 12 (TOURNIQUET CUFF) IMPLANT
CUFF TOURN SGL QUICK 18X4 (TOURNIQUET CUFF) IMPLANT
DRAPE FLUOR MINI C-ARM 54X84 (DRAPES) IMPLANT
DRAPE XRAY CASSETTE 23X24 (DRAPES) IMPLANT
DRSG MEPILEX FLEX 3X3 (GAUZE/BANDAGES/DRESSINGS) IMPLANT
DURAPREP 26ML APPLICATOR (WOUND CARE) ×2 IMPLANT
ELECT REM PT RETURN 9FT ADLT (ELECTROSURGICAL) ×2
ELECTRODE REM PT RTRN 9FT ADLT (ELECTROSURGICAL) ×1 IMPLANT
GAUZE 4X4 16PLY ~~LOC~~+RFID DBL (SPONGE) ×2 IMPLANT
GAUZE PACKING 1/4 X5 YD (GAUZE/BANDAGES/DRESSINGS) ×2 IMPLANT
GAUZE PACKING IODOFORM 1/2 (PACKING) ×2 IMPLANT
GAUZE PACKING IODOFORM 1X5 (PACKING) ×2 IMPLANT
GAUZE SPONGE 4X4 12PLY STRL (GAUZE/BANDAGES/DRESSINGS) ×2 IMPLANT
GAUZE XEROFORM 1X8 LF (GAUZE/BANDAGES/DRESSINGS) ×2 IMPLANT
GLOVE SURG ENC MOIS LTX SZ7.5 (GLOVE) ×2 IMPLANT
GLOVE SURG UNDER LTX SZ8 (GLOVE) ×2 IMPLANT
GOWN STRL REUS W/ TWL XL LVL3 (GOWN DISPOSABLE) ×2 IMPLANT
GOWN STRL REUS W/TWL MED LVL3 (GOWN DISPOSABLE) ×4 IMPLANT
GOWN STRL REUS W/TWL XL LVL3 (GOWN DISPOSABLE) ×2
HANDPIECE VERSAJET DEBRIDEMENT (MISCELLANEOUS) IMPLANT
IV NS 1000ML (IV SOLUTION) ×1
IV NS 1000ML BAXH (IV SOLUTION) ×1 IMPLANT
IV NS IRRIG 3000ML ARTHROMATIC (IV SOLUTION) ×2 IMPLANT
KIT TURNOVER KIT A (KITS) ×2 IMPLANT
LABEL OR SOLS (LABEL) ×2 IMPLANT
MANIFOLD NEPTUNE II (INSTRUMENTS) ×2 IMPLANT
NEEDLE FILTER BLUNT 18X 1/2SAF (NEEDLE) ×1
NEEDLE FILTER BLUNT 18X1 1/2 (NEEDLE) ×1 IMPLANT
NEEDLE HYPO 25X1 1.5 SAFETY (NEEDLE) ×2 IMPLANT
NS IRRIG 500ML POUR BTL (IV SOLUTION) ×2 IMPLANT
PACK EXTREMITY ARMC (MISCELLANEOUS) ×2 IMPLANT
PAD ABD DERMACEA PRESS 5X9 (GAUZE/BANDAGES/DRESSINGS) ×2 IMPLANT
PULSAVAC PLUS IRRIG FAN TIP (DISPOSABLE) ×2
RASP SM TEAR CROSS CUT (RASP) IMPLANT
SHIELD FULL FACE ANTIFOG 7M (MISCELLANEOUS) ×2 IMPLANT
SOL PREP PVP 2OZ (MISCELLANEOUS) ×2
SOLUTION PREP PVP 2OZ (MISCELLANEOUS) ×1 IMPLANT
STOCKINETTE IMPERVIOUS 9X36 MD (GAUZE/BANDAGES/DRESSINGS) ×2 IMPLANT
SUT ETHILON 2 0 FS 18 (SUTURE) ×6 IMPLANT
SUT ETHILON 4-0 (SUTURE) ×1
SUT ETHILON 4-0 FS2 18XMFL BLK (SUTURE) ×1
SUT VIC AB 3-0 SH 27 (SUTURE) ×1
SUT VIC AB 3-0 SH 27X BRD (SUTURE) ×1 IMPLANT
SUT VIC AB 4-0 FS2 27 (SUTURE) ×2 IMPLANT
SUTURE ETHLN 4-0 FS2 18XMF BLK (SUTURE) ×1 IMPLANT
SWAB CULTURE AMIES ANAERIB BLU (MISCELLANEOUS) IMPLANT
SYR 10ML LL (SYRINGE) ×4 IMPLANT
TIP FAN IRRIG PULSAVAC PLUS (DISPOSABLE) ×1 IMPLANT

## 2021-05-14 NOTE — Anesthesia Procedure Notes (Signed)
Procedure Name: LMA Insertion Date/Time: 05/14/2021 12:40 PM Performed by: Fredderick Phenix, CRNA Pre-anesthesia Checklist: Patient identified, Emergency Drugs available, Suction available and Patient being monitored Patient Re-evaluated:Patient Re-evaluated prior to induction Oxygen Delivery Method: Circle system utilized Preoxygenation: Pre-oxygenation with 100% oxygen Induction Type: IV induction Ventilation: Mask ventilation without difficulty LMA: LMA inserted LMA Size: 5.0 Tube type: Oral Number of attempts: 1 Placement Confirmation: breath sounds checked- equal and bilateral, CO2 detector and positive ETCO2 Tube secured with: Tape Dental Injury: Teeth and Oropharynx as per pre-operative assessment

## 2021-05-14 NOTE — Progress Notes (Signed)
PROGRESS NOTE    [name]  FKC:[id number] DOB: 11-01-1965 DOA: 05/09/2021 PCP: Pcp, No    Brief Narrative:  55 y.o. Caucasian male with medical history significant for type 2 diabetes mellitus, hypertension and dyslipidemia, who presented to the emergency room with acute onset of infected right foot wound.  Due to previous history of right foot wound and osteomyelitis he had a great toe and fifth toe amputated.  Swelling has been extending to his whole foot.  His wound was initially over the plantar aspect of his distal foot and later he developed a wound over the lateral aspect.  He now notices foul-smelling drainage and increasing pain that is becoming throbbing in graded 10/10 in severity, worsening with weightbearing.  The redness and pain has been significantly worsening despite wound care that was being provided by his friend who is a retired [employer] scientist.   Patient seen in consultation by podiatry.  MRI pursued with significant osteomyelitis and tissue loss.  Status post transmetatarsal amputation right foot.  Doing well.  Pain well controlled.  ID consulted for antibiotic recommendations.  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 Wound culture with Proteus Plan: ID consulted for antibiotic recommendations Patient may need PICC line and home antibiotics To return to OR today with podiatry 8/16 Podiatry and infectious disease follow-up  Hypercalcemia Unclear etiology, possibly from bone breakdown Improved after hydration normal saline and 1 dose Lasix Plan: Monitor calcium levels At this time no indication for IV bisphosphonate  Type 2 diabetes mellitus Home  sulfonylurea has been held Adequate glycemic control Continue sliding-scale coverage, 3 times daily before meals and at bedtime   Hyperlipidemia PTA statin   DVT prophylaxis: SCDs Code Status: Full Family Communication: None today Disposition Plan: Status is: Inpatient  Remains inpatient appropriate because:Inpatient level of care appropriate due to severity of illness  Dispo: The patient is from: Home              Anticipated d/c is to: Home with home health              Patient currently is not medically stable to d/c.   Difficult to place patient No  Postoperative day #4 status post transmetatarsal amputation right foot osteomyelitis.  Remains on antibiotics.  Infectious disease on consult.  Returning to the [facility] today with podiatry for I&D.     Level of care: Med-Surg  Consultants:  Podiatry  Procedures:  Transmetatarsal amputation 8/12  Antimicrobials:   IV vancomycin Zosyn   Subjective: Patient seen and examined.  Sitting comfortably in bed.  Pain well controlled.  No apparent distress  Objective: Vitals:   05/13/21 1540 05/13/21 1921 05/14/21 0355 05/14/21 0716  BP: (!) 143/90 125/76 127/82 134/87  Pulse: 74 75 64 66  Resp: _0 Temp: 98.4 F (36.9 C) 99.1 F (37.3 C) 98.3 F (36.8 C) 98.1 F (36.7 C)  TempSrc:  Oral Oral Oral  SpO2: 100% 100% 99% 99%  Weight:      Height:        Intake/Output Summary (Last 24 hours) at 05/14/2021 1056 Last data filed at 05/14/2021 0603 Gross per 24 hour  Intake 240 ml  Output 2550 ml  Net -2310  ml   Filed Weights   05/09/21 1416 05/10/21 0008  Weight: 97.5 kg 94.3 kg    Examination:  General exam: No acute distress Respiratory system: Clear to auscultation. Respiratory effort normal. Cardiovascular system: S1-S2 heard, regular rate and rhythm, no murmurs, no pedal edema  gastrointestinal system: Soft, nontender,, normal bowel sounds Central nervous system: Alert and oriented. No focal  neurological deficits. Extremities: Symmetric 5 x 5 power. Skin: Right foot surgical shoe.  Dressing CDI Psychiatry: Judgement and insight appear normal. Mood & affect appropriate.     Data Reviewed: I have personally reviewed following labs and imaging studies  CBC: Recent Labs  Lab 05/09/21 1425 05/10/21 0500  WBC 12.8* 9.3  NEUTROABS 10.3*  --   HGB 15.0 12.7*  HCT 42.3 35.6*  MCV 93.8 94.4  PLT 244 179   Basic Metabolic Panel: Recent Labs  Lab 05/09/21 1425 05/10/21 0500 05/11/21 0618 05/13/21 0148 05/14/21 0159  NA 130* 134* 133*  --   --   K 3.4* 3.1* 3.5  --   --   CL 96* 102 102  --   --   CO2 _0 --   --   GLUCOSE 157* 138* 126*  --   --   BUN _1 --   --   CREATININE 0.70 0.66 0.58* 0.73 0.73  CALCIUM 13.5* 12.8* 11.8*  --   --    GFR: Estimated Creatinine Clearance: 124.7 mL/min (by C-G formula based on SCr of 0.73 mg/dL). Liver Function Tests: Recent Labs  Lab 05/09/21 1425  AST 52*  ALT 46*  ALKPHOS 114  BILITOT 1.3*  PROT 7.2  ALBUMIN 2.9*   No results for input(s): LIPASE, AMYLASE in the last 168 hours. No results for input(s): AMMONIA in the last 168 hours. Coagulation Profile: Recent Labs  Lab 05/09/21 1425 05/10/21 0500  INR 1.2 1.2   Cardiac Enzymes: No results for input(s): CKTOTAL, CKMB, CKMBINDEX, TROPONINI in the last 168 hours. BNP (last 3 results) No results for input(s): PROBNP in the last 8760 hours. HbA1C: No results for input(s): HGBA1C in the last 72 hours.  CBG: Recent Labs  Lab 05/13/21 0719 05/13/21 1142 05/13/21 1643 05/13/21 2100 05/14/21 0717  GLUCAP 150* 160* 142* 172* 145*   Lipid Profile: No results for input(s): CHOL, HDL, LDLCALC, TRIG, CHOLHDL, LDLDIRECT in the last 72 hours. Thyroid Function Tests: No results for input(s): TSH, T4TOTAL, FREET4, T3FREE, THYROIDAB in the last 72 hours. Anemia Panel: No results for input(s): VITAMINB12, FOLATE, FERRITIN, TIBC, IRON, RETICCTPCT in the  last 72 hours. Sepsis Labs: Recent Labs  Lab 05/09/21 1425 05/10/21 0500  PROCALCITON  --  3.18  LATICACIDVEN 1.9  --     Recent Results (from the past 240 hour(s))  Culture, blood (Routine x 2)     Status: None   Collection Time: 05/09/21  2:25 PM   Specimen: BLOOD  Result Value Ref Range Status   Specimen Description BLOOD BLOOD RIGHT FOREARM  Final   Special Requests   Final    BOTTLES DRAWN AEROBIC AND ANAEROBIC Blood Culture adequate volume   Culture   Final    NO GROWTH 5 DAYS Performed at [facility], [address], [place], [place] [address]    Report Status 05/14/2021 FINAL  Final  SARS CORONAVIRUS 2 (TAT 6-24 HRS) Nasopharyngeal Nasopharyngeal Swab     Status: None   Collection Time: 05/09/21 10:05 PM   Specimen: Nasopharyngeal Swab  Result  Value Ref Range Status   SARS Coronavirus 2 NEGATIVE NEGATIVE Final    Comment: (NOTE) SARS-CoV-2 target nucleic acids are NOT DETECTED.  The SARS-CoV-2 RNA is generally detectable in upper and lower respiratory specimens during the acute phase of infection. Negative results do not preclude SARS-CoV-2 infection, do not rule out co-infections with other pathogens, and should not be used as the sole basis for treatment or other patient management decisions. Negative results must be combined with clinical observations, patient history, and epidemiological information. The expected result is Negative.  Fact Sheet for Patients: SugarRoll.be  Fact Sheet for Healthcare Providers: https://www.woods-mathews.com/  This test is not yet approved or cleared by the [place] FDA and  has been authorized for detection and/or diagnosis of SARS-CoV-2 by FDA under an Emergency Use Authorization (EUA). This EUA will remain  in effect (meaning this test can be used) for the duration of the COVID-19 declaration under Se ction 564(b)(1) of the Act, 21 U.S.C. section 360bbb-3(b)(1),  unless the authorization is terminated or revoked sooner.  Performed at [facility], [place] [address] [place], [place] [address]   Surgical PCR screen     Status: None   Collection Time: 05/10/21  2:29 AM   Specimen: Nasal Mucosa; Nasal Swab  Result Value Ref Range Status   MRSA, PCR NEGATIVE NEGATIVE Final   Staphylococcus aureus NEGATIVE NEGATIVE Final    Comment: (NOTE) The Xpert SA Assay (FDA approved for NASAL specimens in patients 59 years of age and older), is one component of a comprehensive surveillance program. It is not intended to diagnose infection nor to guide or monitor treatment. Performed at [facility], [address] [place], [place] [address]   Culture, blood (Routine X 2) w Reflex to ID Panel     Status: None (Preliminary result)   Collection Time: 05/10/21  5:00 AM   Specimen: BLOOD  Result Value Ref Range Status   Specimen Description BLOOD BLOOD LEFT HAND  Final   Special Requests   Final    BOTTLES DRAWN AEROBIC AND ANAEROBIC Blood Culture adequate volume   Culture   Final    NO GROWTH 4 DAYS Performed at [facility], [address] [place], [place] [address]    Report Status PENDING  Incomplete  Aerobic/Anaerobic Culture w Gram Stain (surgical/deep wound)     Status: None (Preliminary result)   Collection Time: 05/10/21 11:13 AM   Specimen: PATH Other; Tissue  Result Value Ref Range Status   Specimen Description   Final    WOUND Performed at [facility], [address], [place], [place] [address]    Special Requests   Final    RIGHT TOE Performed at [facility], [place], [place] [address]    Gram Stain   Final    NO ORGANISMS SEEN MODERATE GRAM POSITIVE COCCI FEW GRAM NEGATIVE RODS    Culture   Final    RARE PROTEUS MIRABILIS WITH IN MIXED ORGANISMS HOLDING FOR POSSIBLE ANAEROBE Performed at [facility], [place] [address], [place],  [address]    Report Status  PENDING  Incomplete   Organism ID, Bacteria PROTEUS MIRABILIS  Final      Susceptibility   Proteus mirabilis - MIC*    AMPICILLIN <=2 SENSITIVE Sensitive     CEFAZOLIN 8 SENSITIVE Sensitive     CEFEPIME <=0.12 SENSITIVE Sensitive     CEFTAZIDIME <=1 SENSITIVE Sensitive     CEFTRIAXONE <=0.25 SENSITIVE  Sensitive     CIPROFLOXACIN <=0.25 SENSITIVE Sensitive     GENTAMICIN <=1 SENSITIVE Sensitive     IMIPENEM 2 SENSITIVE Sensitive     TRIMETH/SULFA <=20 SENSITIVE Sensitive     AMPICILLIN/SULBACTAM <=2 SENSITIVE Sensitive     PIP/TAZO <=4 SENSITIVE Sensitive     * RARE PROTEUS MIRABILIS         Radiology Studies: No results found.      Scheduled Meds:  atorvastatin  40 mg Oral QHS   insulin aspart  0-15 Units Subcutaneous TID WC   Continuous Infusions:  sodium chloride Stopped (05/13/21 1434)   piperacillin-tazobactam (ZOSYN)  IV 3.375 g (05/14/21 0603)     LOS: 5 days    Time spent: 25 minutes    Sidney Ace, MD [facility] Pager [phone]  If 7PM-7AM, please contact night-coverage 05/14/2021, 10:56 AM

## 2021-05-14 NOTE — Anesthesia Preprocedure Evaluation (Signed)
Anesthesia Evaluation  Patient identified by MRN, date of birth, ID band Patient awake    Reviewed: Allergy & Precautions, H&P , NPO status , Patient's Chart, lab work & pertinent test results, reviewed documented beta blocker date and time   Airway Mallampati: II  TM Distance: >3 FB Neck ROM: full    Dental  (+) Teeth Intact   Pulmonary neg pulmonary ROS, Current Smoker and Patient abstained from smoking.,    Pulmonary exam normal        Cardiovascular Exercise Tolerance: Poor hypertension, On Medications negative cardio ROS Normal cardiovascular exam Rate:Normal     Neuro/Psych negative neurological ROS  negative psych ROS   GI/Hepatic negative GI ROS, Neg liver ROS,   Endo/Other  negative endocrine ROSdiabetes, Poorly Controlled, Type 2, Oral Hypoglycemic Agents  Renal/GU negative Renal ROS  negative genitourinary   Musculoskeletal   Abdominal   Peds  Hematology negative hematology ROS (+)   Anesthesia Other Findings . Diabetes ([place])  . Hypertension  -Dyslipidemia - History of right foot osteomyelitis    Reproductive/Obstetrics negative OB ROS                             Anesthesia Physical  Anesthesia Plan  ASA: 3  Anesthesia Plan: General LMA and General   Post-op Pain Management:    Induction: Intravenous  PONV Risk Score and Plan: 2 and Propofol infusion and Ondansetron  Airway Management Planned: LMA  Additional Equipment:   Intra-op Plan:   Post-operative Plan: Extubation in OR  Informed Consent: I have reviewed the patients History and Physical, chart, labs and discussed the procedure including the risks, benefits and alternatives for the proposed anesthesia with the patient or authorized representative who has indicated his/her understanding and acceptance.       Plan Discussed with: CRNA, Anesthesiologist and Surgeon  Anesthesia Plan Comments:         Anesthesia Quick Evaluation

## 2021-05-14 NOTE — Progress Notes (Signed)
OT Cancellation Note  Patient Details Name: Hai Monfils MRN: [id number] DOB: [birth year]   Cancelled Treatment:    Reason Eval/Treat Not Completed: Other (comment). Pt pending procedure, will re-attempt OT tx at later date/time as pt is agreeable and appropriate.   Hanley Hays, MPH, MS, OTR/L ascom [phone] 05/14/21, 10:11 AM

## 2021-05-14 NOTE — Progress Notes (Signed)
PT Cancellation Note  Patient Details Name: [name] MRN: [id number] DOB: 1966-08-21   Cancelled Treatment:    Reason Eval/Treat Not Completed: Other (comment)  Pt in bed, awaiting procedure.  Stated he is unsure of time.  Pt stated he wished to wait for procedure.  Will continue at a later time/date.  Pt will need wheelchair prior to discharge.   [name] 05/14/2021, 8:40 AM

## 2021-05-14 NOTE — Op Note (Signed)
Operative note   Surgeon:Roshard Rezabek    Assistant:None    Preop diagnosis: Abscess status post transmetatarsal amputation right foot    Postop diagnosis: Same    Procedure: Incision and drainage right foot    EBL: Minimal    Anesthesia:local and general.  Local consisted of 6 cc of 0.5% bupivacaine    Hemostasis: None    Specimen: None    Complications: None    Operative indications:[name] is an 55 y.o. that presents today for surgical intervention.  The risks/benefits/alternatives/complications have been discussed and consent has been given.    Procedure:  Patient was brought into the OR and placed on the operating table in thesupine position. After anesthesia was obtained theright lower extremity was prepped and draped in usual sterile fashion.  Attention was directed to the distal aspect of the right foot at the transmetatarsal amputation site.  The vertical incision was noted and opened and all sutures removed.  This was then bluntly evaluated.  At this time hemorrhagic and scant purulent drainage was noted.  The wound was flushed initially with a bulb syringe and then 3 L of sterile saline with a pulse lavage was used to evacuate all the residual hematoma and infectious drainage.  Compression was applied.  No further expression of purulence was noted in the area.  The incision site was closed proximal and distal with a 2-0 nylon with the central aspect left open and packed with half-inch Nu Gauze packing with iodoform.  A large bulky sterile dressing was applied.    Patient tolerated the procedure and anesthesia well.  Was transported from the OR to the PACU with all vital signs stable and vascular status intact. To be discharged per routine protocol.  Marland Kitchen

## 2021-05-14 NOTE — Anesthesia Postprocedure Evaluation (Signed)
Anesthesia Post Note  Patient: [name]  Procedure(s) Performed: IRRIGATION AND DEBRIDEMENT EXTREMITY- RIGHT FOOT (Right)  Patient location during evaluation: PACU Anesthesia Type: General Level of consciousness: awake and alert, awake and oriented Pain management: pain level controlled Vital Signs Assessment: post-procedure vital signs reviewed and stable Respiratory status: spontaneous breathing, nonlabored ventilation and respiratory function stable Cardiovascular status: blood pressure returned to baseline and stable Postop Assessment: no apparent nausea or vomiting Anesthetic complications: no   No notable events documented.   Last Vitals:  Vitals:   05/14/21 1341 05/14/21 1457  BP:  (!) 136/92  Pulse:  62  Resp: 18 15  Temp: (!) 36.3 C 36.5 C  SpO2:  100%    Last Pain:  Vitals:   05/14/21 1457  TempSrc: Oral  PainSc:                  Phill Mutter

## 2021-05-14 NOTE — Transfer of Care (Signed)
Immediate Anesthesia Transfer of Care Note  Patient: [name]  Procedure(s) Performed: IRRIGATION AND DEBRIDEMENT EXTREMITY- RIGHT FOOT (Right)  Patient Location: PACU  Anesthesia Type:General  Level of Consciousness: awake, alert  and oriented  Airway & Oxygen Therapy: Patient Spontanous Breathing and Patient connected to face mask oxygen  Post-op Assessment: Report given to RN and Post -op Vital signs reviewed and stable  Post vital signs: Reviewed and stable  Last Vitals:  Vitals Value Taken Time  BP 99/69 05/14/21 1311  Temp 36 C 05/14/21 1311  Pulse 68 05/14/21 1314  Resp 17 05/14/21 1314  SpO2 100 % 05/14/21 1314  Vitals shown include unvalidated device data.  Last Pain:  Vitals:   05/14/21 1142  TempSrc: Oral  PainSc: 0-No pain         Complications: No notable events documented.

## 2021-05-14 NOTE — Plan of Care (Signed)
Pt's pain well- controlled this shift. Only tylenol administered this shift. Problem: Education: Goal: Knowledge of General Education information will improve Description: Including pain rating scale, medication(s)/side effects and non-pharmacologic comfort measures Outcome: Progressing   Problem: Health Behavior/Discharge Planning: Goal: Ability to manage health-related needs will improve Outcome: Progressing   Problem: Clinical Measurements: Goal: Will remain free from infection Outcome: Progressing Goal: Diagnostic test results will improve Outcome: Progressing   Problem: Activity: Goal: Risk for activity intolerance will decrease Outcome: Progressing   Problem: Pain Managment: Goal: General experience of comfort will improve Outcome: Progressing   Problem: Safety: Goal: Ability to remain free from injury will improve Outcome: Progressing   Problem: Skin Integrity: Goal: Risk for impaired skin integrity will decrease Outcome: Progressing   Problem: Clinical Measurements: Goal: Ability to avoid or minimize complications of infection will improve Outcome: Progressing   Problem: Skin Integrity: Goal: Skin integrity will improve Outcome: Progressing

## 2021-05-14 NOTE — Progress Notes (Signed)
ID Patient underwent incision and drainage of the abscess on the right foot amputation site. Doing well No fever No pain  On examination Awake and alert Patient Vitals for the past 24 hrs:  BP Temp Temp src Pulse Resp SpO2  05/14/21 1945 138/87 97.7 F (36.5 C) Oral 72 20 100 %  05/14/21 1457 (!) 136/92 97.7 F (36.5 C) Oral 62 15 100 %  05/14/21 1341 -- (!) 97.3 F (36.3 C) -- -- 18 --  05/14/21 1330 116/72 -- -- 73 17 100 %  05/14/21 1315 101/71 -- -- 67 18 100 %  05/14/21 1311 99/69 (!) 96.8 F (36 C) -- 71 13 100 %  05/14/21 1142 (!) 154/93 97.9 F (36.6 C) Oral 72 16 100 %  05/14/21 1111 (!) 142/93 98.4 F (36.9 C) -- 68 15 99 %  05/14/21 0716 134/87 98.1 F (36.7 C) Oral 66 -- 99 %  05/14/21 0355 127/82 98.3 F (36.8 C) Oral 64 20 99 %    Chest bilateral air entry Heart sound S1-S2 Right foot dressing not removed  Labs  CBC Latest Ref Rng & Units 05/10/2021 05/09/2021 07/13/2020  WBC 4.0 - 10.5 K/uL 9.3 12.8(H) 5.9  Hemoglobin 13.0 - 17.0 g/dL 12.7(L) 15.0 13.6  Hematocrit 39.0 - 52.0 % 35.6(L) 42.3 40.9  Platelets 150 - 400 K/uL 222 244 202    CMP Latest Ref Rng & Units 05/14/2021 05/13/2021 05/11/2021  Glucose 70 - 99 mg/dL - - 126(H)  BUN 6 - 20 mg/dL - - 8  Creatinine 0.61 - 1.24 mg/dL 0.73 0.73 0.58(L)  Sodium 135 - 145 mmol/L - - 133(L)  Potassium 3.5 - 5.1 mmol/L - - 3.5  Chloride 98 - 111 mmol/L - - 102  CO2 22 - 32 mmol/L - - 27  Calcium 8.9 - 10.3 mg/dL - - 11.8(H)  Total Protein 6.5 - 8.1 g/dL - - -  Total Bilirubin 0.3 - 1.2 mg/dL - - -  Alkaline Phos 38 - 126 U/L - - -  AST 15 - 41 U/L - - -  ALT 0 - 44 U/L - - -     Micro Culture from the wound sent during surgery has Proteus and anaerobes  Impression/recommendation Diabetic foot infection of the right foot.  Previously had undergone fifth toe removal and right great toe amputation.  Then developed a new wound.  This admission he had transmetatarsal amputation of the right foot.  This was  done on 05/10/2021. 9 today she he was taken for I&D of the residual abscess. Patient currently on Zosyn. Will change to Unasyn as cultures have shown Proteus and anaerobes. You will need at least 4 weeks of IV antibiotics as the proximal margin of the amputated bone has osteomyelitis On discharge we will give him ceftriaxone IV plus p.o. Flagyl  Diabetes mellitus on insulin  Hyperlipidemia on atorvastatin  Discussed the management with the patient in detail.

## 2021-05-14 NOTE — Progress Notes (Signed)
Pt evaluated yesterday by podiatry.  Noted recurrent signs of infections.  D/W pt and will plan for I & D today.  Orders already in place. Pt consents to procedure.

## 2021-05-15 ENCOUNTER — Inpatient Hospital Stay: Payer: Self-pay

## 2021-05-15 ENCOUNTER — Encounter: Payer: Self-pay | Admitting: Podiatry

## 2021-05-15 LAB — CULTURE, BLOOD (ROUTINE X 2)
Culture: NO GROWTH
Special Requests: ADEQUATE

## 2021-05-15 LAB — AEROBIC/ANAEROBIC CULTURE W GRAM STAIN (SURGICAL/DEEP WOUND): Gram Stain: NONE SEEN

## 2021-05-15 LAB — GLUCOSE, CAPILLARY
Glucose-Capillary: 227 mg/dL — ABNORMAL HIGH (ref 70–99)
Glucose-Capillary: 211 mg/dL — ABNORMAL HIGH (ref 70–99)
Glucose-Capillary: 152 mg/dL — ABNORMAL HIGH (ref 70–99)

## 2021-05-15 MED ORDER — SODIUM CHLORIDE 0.9% FLUSH: 10.0000 mL | INTRAVENOUS | Status: DC | PRN

## 2021-05-15 MED ORDER — METRONIDAZOLE 500 MG PO TABS
500.0000 mg | ORAL_TABLET | Freq: Three times a day (TID) | ORAL | Status: DC
  Administered 2021-05-16 – 2021-05-22 (×19): 500 mg via ORAL
  Filled 2021-05-15 (×19): qty 1

## 2021-05-15 MED ORDER — SODIUM CHLORIDE 0.9 % IV SOLN
2.0000 g | INTRAVENOUS | Status: DC
  Administered 2021-05-16 – 2021-05-22 (×7): 2 g via INTRAVENOUS
  Filled 2021-05-15: qty 2
  Filled 2021-05-15 (×2): qty 20
  Filled 2021-05-15: qty 2
  Filled 2021-05-15 (×3): qty 20

## 2021-05-15 MED ORDER — CHLORHEXIDINE GLUCONATE CLOTH 2 % EX PADS
6.0000 | MEDICATED_PAD | Freq: Every day | CUTANEOUS | Status: DC
  Administered 2021-05-15 – 2021-05-22 (×8): 6 via TOPICAL

## 2021-05-15 MED ORDER — SODIUM CHLORIDE 0.9% FLUSH
10.0000 mL | Freq: Two times a day (BID) | INTRAVENOUS | Status: DC
  Administered 2021-05-15 – 2021-05-22 (×13): 10 mL

## 2021-05-15 NOTE — Progress Notes (Signed)
Physical Therapy Treatment Patient Details Name: [name] MRN: [id number] DOB: Jul 11, 1966 Today's Date: 05/15/2021    History of Present Illness 55 y.o. Caucasian male with medical history significant for type 2 diabetes mellitus, hypertension and dyslipidemia, who presented to the emergency room with acute onset of infected right foot wound.  Due to previous history of right foot wound and osteomyelitis he had a great toe and fifth toe amputated.  Swelling has been extending to his whole foot.  His wound was initially over the plantar aspect of his distal foot and later he developed a wound over the lateral aspect.  He now notices foul-smelling drainage and increasing pain that is becoming throbbing in graded 10/10 in severity, worsening with weightbearing.  The redness and pain has been significantly worsening despite wound care that was being provided by his friend who is a retired [employer] scientist. 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    PT Comments    Patient s/p irrigation and debridgement of R LE on previous date; remains NWB to R foot. Minimal/no change to functional status and overall performance.  Goals and POC from initial evaluation remain appropriate for patient. Session emphasis on functional transfers, sit/stand, standing balance and overall safety awareness/insight.  Completes all activities with cga/close sup this date; voices understanding of functional implications of seating choices on functional performance and safety within home environment. Indep dons/doffs R offloading shoe and indep voices wearing schedule, R LE WBing precautions.    Follow Up Recommendations  Home health PT     Equipment Recommendations  Wheelchair (measurements PT);Wheelchair cushion (measurements PT);3in1 (PT) (tall RW)    Recommendations for Other Services       Precautions / Restrictions Precautions Precautions: Fall Restrictions Weight  Bearing Restrictions: Yes RLE Weight Bearing: Non weight bearing Other Position/Activity Restrictions: minimal weight bearing thru heal with transfer and balance    Mobility  Bed Mobility Overal bed mobility: Modified Independent Bed Mobility: Supine to Sit     Supine to sit: Supervision;HOB elevated     General bed mobility comments: increased time, use of bed rails    Transfers Overall transfer level: Needs assistance Equipment used: Rolling walker (2 wheeled) Transfers: Sit to/from Stand Sit to Stand: Supervision         General transfer comment: initially performing with one hand on seating surface, one on RW; progressed to bilat hands pushing from seating surface with improved safety/efficiency noted  Ambulation/Gait Ambulation/Gait assistance: Min guard Gait Distance (Feet):  (30' x1, 15' x2) Assistive device: Rolling walker (2 wheeled)       General Gait Details: hop-to gait pattern, fair/good UB strength and ability to maintain NWB R LE.  Improved balance and control of RW.  Did discuss use of shoe to L LE for optimal balance/stabilization and overall protection of L foot with gait efforts. Patient voiced understanding and agreement.   Stairs             Wheelchair Mobility    Modified Rankin (Stroke Patients Only)       Balance Overall balance assessment: Needs assistance Sitting-balance support: Feet supported;No upper extremity supported Sitting balance-Leahy Scale: Good     Standing balance support: Bilateral upper extremity supported Standing balance-Leahy Scale: Fair Standing balance comment: F static standing with UEs on RW, support bilaterally.                            Cognition  Arousal/Alertness: Awake/alert Behavior During Therapy: WFL for tasks assessed/performed Overall Cognitive Status: Within Functional Limits for tasks assessed                                 General Comments: appropriate,  pleasant      Exercises Other Exercises Other Exercises: Discussed DME for home use-agreeable to tall RW, BSC and manual WC.  TOC informed/aware of recommendations.  Did discuss use/benefit of TTB for shower/bathing routine; patient voiced understanding. Other Exercises: Sit/stand from multiple seating surfaces (bed, recliner, chair with and without arm rests) to assess ability to effectively manage various surface heights and chair components.  Performs with cga/close sup from higher surfaces with armrests; min assist from lower surfaces without armrests.  Continues to optimal safety with pushing with bilat UEs from seating surface for sit/stand; does prefer to reach back with single UE for stand to sit.  Reviewed functional implications of seating choices on overall safety/indep with mobiltiy in home environment.  Patient voiced understanding of information. Other Exercises: Indep dons/doffs R offloading shoe and indep voices wearing schedule, R LE WBing precautions.    General Comments        Pertinent Vitals/Pain Pain Assessment: No/denies pain Pain Score: 2  Pain Location: R foot Pain Descriptors / Indicators: Tender Pain Intervention(s): Limited activity within patient's tolerance;Monitored during session    Home Living                      Prior Function            PT Goals (current goals can now be found in the care plan section) Acute Rehab PT Goals Patient Stated Goal: Want to be able to walk and get around safely PT Goal Formulation: With patient Time For Goal Achievement: 05/25/21 Potential to Achieve Goals: Fair Progress towards PT goals: Progressing toward goals    Frequency    7X/week      PT Plan Current plan remains appropriate    Co-evaluation              AM-PAC PT "6 Clicks" Mobility   Outcome Measure  Help needed turning from your back to your side while in a flat bed without using bedrails?: None Help needed moving from lying on  your back to sitting on the side of a flat bed without using bedrails?: None Help needed moving to and from a bed to a chair (including a wheelchair)?: A Little Help needed standing up from a chair using your arms (e.g., wheelchair or bedside chair)?: A Little Help needed to walk in hospital room?: A Little Help needed climbing 3-5 steps with a railing? : A Little 6 Click Score: 20    End of Session Equipment Utilized During Treatment: Gait belt Activity Tolerance: Patient tolerated treatment well Patient left: with call bell/phone within reach;with bed alarm set Nurse Communication: Mobility status PT Visit Diagnosis: Unsteadiness on feet (R26.81);Other abnormalities of gait and mobility (R26.89);Muscle weakness (generalized) (M62.81);Difficulty in walking, not elsewhere classified (R26.2)     Time: [id number] PT Time Calculation (min) (ACUTE ONLY): 24 min  Charges:  $Gait Training: 8-22 mins $Therapeutic Activity: 8-22 mins                     Alija Riano H. Owens Shark, PT, DPT, NCS 05/15/21, 4:16 PM [phone]

## 2021-05-15 NOTE — Treatment Plan (Signed)
Diagnosis: Diabetic foot infection Baseline Creatinine < 1  Culture Result: proteus and bacteroides  No Known Allergies  OPAT Orders Discharge antibiotics: Ceftriaxone 2 grams IV every 24 hours  + PO flagyl 552m TID  4 weeks End Date: 06/13/21  [facility]:including placement of biopatch  Labs weekly on Monday while on IV antibiotics: __X CBC with differential  __X CMP  Once every 2 weeks  _X_ CRP _X_ ESR   _X_ Please pull PIC at completion of IV antibiotics _   Fax weekly labs to Dr.Suheyla [phone] Clinic Follow Up Appt:06/06/21 at 9.45AM   Call [phone] any questions

## 2021-05-15 NOTE — Progress Notes (Signed)
1 Day Post-Op   Subjective/Chief Complaint: Patient seen.  In general no complaints.  Patient is status post 1 day I&D due to abscess present.  Patient has kept dressings clean, dry, and intact since yesterday and has been ambulating with heel contact in a surgical shoe with use of walker.   Objective: Vital signs in last 24 hours: Temp:  [96.8 F (36 C)-98.3 F (36.8 C)] 98.1 F (36.7 C) (08/17 1114) Pulse Rate:  [62-79] 79 (08/17 1114) Resp:  [13-20] 15 (08/17 1114) BP: (99-152)/(69-98) 146/96 (08/17 1114) SpO2:  [98 %-100 %] 100 % (08/17 1114) Last BM Date: 05/13/21  Intake/Output from previous day: 08/16 0701 - 08/17 0700 In: 1080 [P.O.:480; I.V.:400; IV Piggyback:200] Out: 1602 [Urine:1600; Blood:2] Intake/Output this shift: Total I/O In: 240 [P.O.:240] Out: 750 [Urine:750]  Right foot examined.  Incision line for the most part appears to be well coapted with sutures intact.  Area of packing removed today to the right distal aspect of the flap where the T incision meets the other portion of the incision dorsally.  Revealed some serosanguineous drainage, no purulence, no odor, no erythema, mild edema.  Lab Results:  No results for input(s): WBC, HGB, HCT, PLT in the last 72 hours. BMET Recent Labs    05/13/21 0148 05/14/21 0159  CREATININE 0.73 0.73    PT/INR No results for input(s): LABPROT, INR in the last 72 hours. ABG No results for input(s): PHART, HCO3 in the last 72 hours.  Invalid input(s): PCO2, PO2  Studies/Results: [place] EKG SITE RITE  Result Date: 05/15/2021 If Site Rite image not attached, placement could not be confirmed due to current cardiac rhythm.   Anti-infectives: Anti-infectives (From admission, onward)    Start     Dose/Rate Route Frequency Ordered Stop   05/14/21 1800  Ampicillin-Sulbactam (UNASYN) 3 g in sodium chloride 0.9 % 100 mL IVPB        3 g 200 mL/hr over 30 Minutes Intravenous Every 6 hours 05/14/21 1630     05/14/21 1152   piperacillin-tazobactam (ZOSYN) 3.375 GM/50ML IVPB       Note to Pharmacy: Trudie Reed   : cabinet override      05/14/21 1152 05/14/21 1244   05/10/21 2300  vancomycin (VANCOREADY) IVPB 2000 mg/400 mL  Status:  Discontinued        2,000 mg 200 mL/hr over 120 Minutes Intravenous Every 12 hours 05/10/21 2041 05/13/21 1313   05/10/21 1125  vancomycin (VANCOCIN) powder  Status:  Discontinued          As needed 05/10/21 1125 05/10/21 1152   05/10/21 0800  vancomycin (VANCOREADY) IVPB 2000 mg/400 mL  Status:  Discontinued        2,000 mg 200 mL/hr over 120 Minutes Intravenous Every 12 hours 05/09/21 2008 05/10/21 2041   05/10/21 0600  piperacillin-tazobactam (ZOSYN) IVPB 3.375 g  Status:  Discontinued        3.375 g 12.5 mL/hr over 240 Minutes Intravenous Every 8 hours 05/09/21 2000 05/14/21 1630   05/09/21 2000  vancomycin (VANCOCIN) IVPB 1000 mg/200 mL premix  Status:  Discontinued        1,000 mg 200 mL/hr over 60 Minutes Intravenous  Once 05/09/21 1954 05/09/21 1959   05/09/21 2000  piperacillin-tazobactam (ZOSYN) IVPB 3.375 g  Status:  Discontinued        3.375 g 100 mL/hr over 30 Minutes Intravenous Every 6 hours 05/09/21 1954 05/09/21 1959   05/09/21 2000  vancomycin (VANCOCIN) IVPB 1000 mg/200  mL premix  Status:  Discontinued        1,000 mg 200 mL/hr over 60 Minutes Intravenous  Once 05/09/21 1955 05/09/21 1959   05/09/21 2000  piperacillin-tazobactam (ZOSYN) IVPB 3.375 g  Status:  Discontinued        3.375 g 100 mL/hr over 30 Minutes Intravenous Every 6 hours 05/09/21 1955 05/09/21 1959   05/09/21 1800  piperacillin-tazobactam (ZOSYN) IVPB 3.375 g        3.375 g 100 mL/hr over 30 Minutes Intravenous  Once 05/09/21 1746 05/09/21 1843   05/09/21 1800  vancomycin (VANCOREADY) IVPB 2000 mg/400 mL        2,000 mg 200 mL/hr over 120 Minutes Intravenous  Once 05/09/21 1746 05/09/21 2131       Assessment/Plan: s/p Procedure(s): IRRIGATION AND DEBRIDEMENT EXTREMITY- RIGHT FOOT  (Right) Assessment: Status post transmetatarsal amputation right foot  Plan:  -Patient seen and examined -Applied Xeroform to the incision lines followed by 4 x 4 gauze, ABD, Kerlix, Ace wrap. -Patient should have dressing changes every 2 to 3 days consisting of cleansing the foot with warm soapy water normal sterile saline then applying Xeroform to the incision line, Betadine paint to any areas of maceration, 4 x 4 gauze, ABD, Kerlix, Ace wrap with mild compression. -Patient should still try to stay off the right foot as much possible and walk with heel contact with use of walker and surgical shoe for only short distances. -Appreciate infectious disease recommendations.  Patient will need IV antibiotic therapy due to positive closing bone culture/path specimen. -We will likely have Dr. Vickki Muff see the patient 1 more time tomorrow if patient is still in house for further discussion of care.  At this time patient does appear to be medically stable at this time and once PICC line is placed and IV antibiotics are set up may likely be able to discharge from podiatry perspective.   LOS: 6 days    Caroline More 05/15/2021

## 2021-05-15 NOTE — Progress Notes (Addendum)
Occupational Therapy Treatment Patient Details Name: [name] MRN: [id number] DOB: 06-Mar-1966 Today's Date: 05/15/2021    History of present illness 55 y.o. Caucasian male with medical history significant for type 2 diabetes mellitus, hypertension and dyslipidemia, who presented to the emergency room with acute onset of infected right foot wound.  Due to previous history of right foot wound and osteomyelitis he had a great toe and fifth toe amputated.  Swelling has been extending to his whole foot.  His wound was initially over the plantar aspect of his distal foot and later he developed a wound over the lateral aspect.  He now notices foul-smelling drainage and increasing pain that is becoming throbbing in graded 10/10 in severity, worsening with weightbearing.  The redness and pain has been significantly worsening despite wound care that was being provided by his friend who is a retired [employer] scientist. 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   OT comments  Pt seen for OT tx this date to f/u re: safety with ADLs/ADL mobility. Pt able to come to EOB Sitting with MOD I and demos G static sitting balance. Pt requires cues for modified cross-leg technique to sit EOB and don post-op shoe. Pt requires one cue for WB precautions and one cue to sequence use of RW for STS from EOB sitting. Pt demos good carryover of safety and sequencing cues. Requires SUPV on 3 trials from EOB sitting with FWW. Pt left in EOB sitting to eat lunch. All needs met and in reach. Will continue to follow acutely. Continue to recommend [place] f/u for safety with ADLs in the natural environment.    Follow Up Recommendations  Home health OT    Equipment Recommendations  3 in 1 bedside commode;Tub/shower bench;Toilet rise with handles;Wheelchair cushion (measurements OT);Wheelchair (measurements OT)    Recommendations for Other Services      Precautions / Restrictions  Precautions Precautions: Fall Restrictions Weight Bearing Restrictions: Yes RLE Weight Bearing: Non weight bearing Other Position/Activity Restrictions: minimal weight bearing thru heal with transfer and balance       Mobility Bed Mobility Overal bed mobility: Modified Independent Bed Mobility: Supine to Sit     Supine to sit: Supervision;HOB elevated     General bed mobility comments: increased time, use of bed rails    Transfers Overall transfer level: Needs assistance Equipment used: Rolling walker (2 wheeled) Transfers: Sit to/from Stand Sit to Stand: Supervision         General transfer comment: initially performing with one hand on seating surface, one on RW; progressed to bilat hands pushing from seating surface with improved safety/efficiency noted    Balance Overall balance assessment: Needs assistance Sitting-balance support: Feet supported;No upper extremity supported Sitting balance-Leahy Scale: Good     Standing balance support: Bilateral upper extremity supported Standing balance-Leahy Scale: Fair Standing balance comment: F static standing with UEs on RW, support bilaterally.                           ADL either performed or assessed with clinical judgement   ADL Overall ADL's : Needs assistance/impaired                     Lower Body Dressing: Set up;Sitting/lateral leans Lower Body Dressing Details (indicate cue type and reason): to don post-op shoe  Vision Baseline Vision/History: Wears glasses Patient Visual Report: No change from baseline     Perception     Praxis      Cognition Arousal/Alertness: Awake/alert Behavior During Therapy: WFL for tasks assessed/performed Overall Cognitive Status: Within Functional Limits for tasks assessed                                 General Comments: appropriate, pleasant        Exercises Other Exercises Other Exercises: Discussed  DME for home use-agreeable to tall RW, BSC and manual WC.  TOC informed/aware of recommendations.  Did discuss use/benefit of TTB for shower/bathing routine; patient voiced understanding. Other Exercises: Sit/stand from multiple seating surfaces (bed, recliner, chair with and without arm rests) to assess ability to effectively manage various surface heights and chair components.  Performs with cga/close sup from higher surfaces with armrests; min assist from lower surfaces without armrests.  Continues to optimal safety with pushing with bilat UEs from seating surface for sit/stand; does prefer to reach back with single UE for stand to sit.  Reviewed functional implications of seating choices on overall safety/indep with mobiltiy in home environment.  Patient voiced understanding of information. Other Exercises: Indep dons/doffs R offloading shoe and indep voices wearing schedule, R LE WBing precautions.   Shoulder Instructions       General Comments      Pertinent Vitals/ Pain       Pain Assessment: No/denies pain Pain Score: 2  Pain Location: R foot Pain Descriptors / Indicators: Tender Pain Intervention(s): Limited activity within patient's tolerance;Monitored during session  Home Living                                          Prior Functioning/Environment              Frequency  Min 2X/week        Progress Toward Goals  OT Goals(current goals can now be found in the care plan section)  Progress towards OT goals: Progressing toward goals  Acute Rehab OT Goals Patient Stated Goal: Want to be able to walk and get around safely OT Goal Formulation: With patient Time For Goal Achievement: 05/25/21 Potential to Achieve Goals: Good  Plan Frequency remains appropriate;Discharge plan needs to be updated    Co-evaluation                 AM-PAC OT "6 Clicks" Daily Activity     Outcome Measure   Help from another person eating meals?: None Help  from another person taking care of personal grooming?: A Little Help from another person toileting, which includes using toliet, bedpan, or urinal?: A Little Help from another person bathing (including washing, rinsing, drying)?: A Little Help from another person to put on and taking off regular upper body clothing?: None Help from another person to put on and taking off regular lower body clothing?: A Little 6 Click Score: 20    End of Session Equipment Utilized During Treatment: Rolling walker  OT Visit Diagnosis: Unsteadiness on feet (R26.81);Other abnormalities of gait and mobility (R26.89);Muscle weakness (generalized) (M62.81);Pain Pain - Right/Left: Right Pain - part of body: Ankle and joints of foot   Activity Tolerance Patient tolerated treatment well   Patient Left with call bell/phone within reach;Other (comment) (seated EOB, bed alarm off  while eating lunch. RN aware)   Nurse Communication Mobility status;Other (comment) (notified pt seated EOB)        Time: 0932-3557 OT Time Calculation (min): 20 min  Charges: OT General Charges $OT Visit: 1 Visit OT Treatments $Self Care/Home Management : 8-22 mins  Gerrianne Scale, MS, OTR/L ascom [phone] 05/15/21, 4:20 PM

## 2021-05-15 NOTE — Progress Notes (Signed)
PHARMACY CONSULT NOTE FOR:  OUTPATIENT  PARENTERAL ANTIBIOTIC THERAPY (OPAT)  Indication: diabetic foot infection with osteomyelitis Regimen: ceftriaxone 2gm IV q24h (also metronidazole '500mg'$  PO TID) End date: 06/13/2021  IV antibiotic discharge orders are pended. To discharging provider:  please sign these orders via discharge navigator,  Select New Orders & click on the button choice - Manage This Unsigned Work.     Thank you for allowing pharmacy to be a part of this patient's care.  Doreene Eland, PharmD, BCPS.   Work Cell: [phone] 05/15/2021 12:38 PM

## 2021-05-15 NOTE — Progress Notes (Signed)
PROGRESS NOTE    [name]  WPV:[id number] DOB: Jun 20, 1966 DOA: 05/09/2021 PCP: Pcp, No    Brief Narrative:  55 y.o. Caucasian male with medical history significant for type 2 diabetes mellitus, hypertension and dyslipidemia, who presented to the emergency room with acute onset of infected right foot wound.  Due to previous history of right foot wound and osteomyelitis he had a great toe and fifth toe amputated.  Swelling has been extending to his whole foot.  His wound was initially over the plantar aspect of his distal foot and later he developed a wound over the lateral aspect.  He now notices foul-smelling drainage and increasing pain that is becoming throbbing in graded 10/10 in severity, worsening with weightbearing.  The redness and pain has been significantly worsening despite wound care that was being provided by his friend who is a retired [employer] scientist.   Patient seen in consultation by podiatry.  MRI pursued with significant osteomyelitis and tissue loss.  Status post transmetatarsal amputation right foot.  Doing well.  Pain well controlled.  ID consulted for antibiotic recommendations.  8/17- went to OR yesterday for I&D of right foot.  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 Flagyl 500 mg 3 times daily x4 weeks, end date 06/13/2021. Will need PICC placement. Dressing changed today by podiatry Patient should still to stay off right foot is much as possible and walk with heel contact with use of walker and surgical shoe for only short  distances     Hypercalcemia Unclear etiology, possibly from bone breakdown Improved after hydration normal saline and 1 dose Lasix 8/17 last ca level improved.  Continue to monitor periodically   Type 2 diabetes mellitus Home sulfonylurea has been held Adequate glycemic control 8/17 continue R-ISS     Hyperlipidemia Continue statins   DVT prophylaxis: SCDs Code Status: Full Family Communication: None today Disposition Plan: Status is: Inpatient  Remains inpatient appropriate because:Inpatient level of care appropriate due to severity of illness  Dispo: The patient is from: Home              Anticipated d/c is to: Home with home health              Patient currently is not medically stable to d/c.   Difficult to place patient No  The PICC line placement when cleared by ID     Level of care: Med-Surg  Consultants:  Podiatry, ID  Procedures:  Transmetatarsal amputation 8/12  Antimicrobials:   IV vancomycin Zosyn-dc'd 8/16 unasyn   Subjective: No pain, sob, chills.   Objective: Vitals:   05/14/21 1945 05/14/21 2350 05/15/21 0436 05/15/21 0808  BP: 138/87 (!) 152/98 139/85 (!) 141/85  Pulse: 72 75 71 70  Resp: '20 20 20 18  ' Temp: 97.7 F (36.5 C) 98.3 F (36.8 C) 97.8 F (36.6 C) 97.7 F (36.5 C)  TempSrc: Oral Oral Oral Oral  SpO2: 100% 100% 100% 98%  Weight:      Height:        Intake/Output Summary (Last 24 hours) at 05/15/2021 0831 Last data  filed at 05/15/2021 0459 Gross per 24 hour  Intake 1080 ml  Output 1602 ml  Net -522 ml   Filed Weights   05/09/21 1416 05/10/21 0008  Weight: 97.5 kg 94.3 kg    Examination: NAD, calm CTA no wheeze rales rhonchi Regular S1-S2 no gallops Soft benign positive bowel sounds Right foot dressing in place, left lower extremity no edema Aaxox4.   Data Reviewed: I have personally reviewed following labs and imaging studies  CBC: Recent Labs  Lab 05/09/21 1425 05/10/21 0500  WBC 12.8* 9.3   NEUTROABS 10.3*  --   HGB 15.0 12.7*  HCT 42.3 35.6*  MCV 93.8 94.4  PLT 244 736   Basic Metabolic Panel: Recent Labs  Lab 05/09/21 1425 05/10/21 0500 05/11/21 0618 05/13/21 0148 05/14/21 0159  NA 130* 134* 133*  --   --   K 3.4* 3.1* 3.5  --   --   CL 96* 102 102  --   --   CO2 '31 27 27  ' --   --   GLUCOSE 157* 138* 126*  --   --   BUN '12 9 8  ' --   --   CREATININE 0.70 0.66 0.58* 0.73 0.73  CALCIUM 13.5* 12.8* 11.8*  --   --    GFR: Estimated Creatinine Clearance: 124.7 mL/min (by C-G formula based on SCr of 0.73 mg/dL). Liver Function Tests: Recent Labs  Lab 05/09/21 1425  AST 52*  ALT 46*  ALKPHOS 114  BILITOT 1.3*  PROT 7.2  ALBUMIN 2.9*   No results for input(s): LIPASE, AMYLASE in the last 168 hours. No results for input(s): AMMONIA in the last 168 hours. Coagulation Profile: Recent Labs  Lab 05/09/21 1425 05/10/21 0500  INR 1.2 1.2   Cardiac Enzymes: No results for input(s): CKTOTAL, CKMB, CKMBINDEX, TROPONINI in the last 168 hours. BNP (last 3 results) No results for input(s): PROBNP in the last 8760 hours. HbA1C: No results for input(s): HGBA1C in the last 72 hours.  CBG: Recent Labs  Lab 05/14/21 1124 05/14/21 1319 05/14/21 1642 05/14/21 2048 05/15/21 0800  GLUCAP 154* 168* 190* 188* 227*   Lipid Profile: No results for input(s): CHOL, HDL, LDLCALC, TRIG, CHOLHDL, LDLDIRECT in the last 72 hours. Thyroid Function Tests: No results for input(s): TSH, T4TOTAL, FREET4, T3FREE, THYROIDAB in the last 72 hours. Anemia Panel: No results for input(s): VITAMINB12, FOLATE, FERRITIN, TIBC, IRON, RETICCTPCT in the last 72 hours. Sepsis Labs: Recent Labs  Lab 05/09/21 1425 05/10/21 0500  PROCALCITON  --  3.18  LATICACIDVEN 1.9  --     Recent Results (from the past 240 hour(s))  Culture, blood (Routine x 2)     Status: None   Collection Time: 05/09/21  2:25 PM   Specimen: BLOOD  Result Value Ref Range Status   Specimen Description BLOOD  BLOOD RIGHT FOREARM  Final   Special Requests   Final    BOTTLES DRAWN AEROBIC AND ANAEROBIC Blood Culture adequate volume   Culture   Final    NO GROWTH 5 DAYS Performed at [facility], [address] [place], [place] [address]    Report Status 05/14/2021 FINAL  Final  SARS CORONAVIRUS 2 (TAT 6-24 HRS) Nasopharyngeal Nasopharyngeal Swab     Status: None   Collection Time: 05/09/21 10:05 PM   Specimen: Nasopharyngeal Swab  Result Value Ref Range Status   SARS Coronavirus 2 NEGATIVE NEGATIVE Final    Comment: (NOTE) SARS-CoV-2 target nucleic acids are NOT DETECTED.  The SARS-CoV-2 RNA is generally detectable in upper and lower respiratory specimens during the acute phase of infection. Negative results do not preclude SARS-CoV-2 infection, do not rule out co-infections with other pathogens, and should not be used as the sole basis for treatment or other patient management decisions. Negative results must be combined with clinical observations, patient history, and epidemiological information. The expected result is Negative.  Fact Sheet for Patients: SugarRoll.be  Fact Sheet for Healthcare Providers: https://www.woods-mathews.com/  This test is not yet approved or cleared by the [place] FDA and  has been authorized for detection and/or diagnosis of SARS-CoV-2 by FDA under an Emergency Use Authorization (EUA). This EUA will remain  in effect (meaning this test can be used) for the duration of the COVID-19 declaration under Se ction 564(b)(1) of the Act, 21 U.S.C. section 360bbb-3(b)(1), unless the authorization is terminated or revoked sooner.  Performed at [facility], [place] [address] [place], [place] [address]   Surgical PCR screen     Status: None   Collection Time: 05/10/21  2:29 AM   Specimen: Nasal Mucosa; Nasal Swab  Result Value Ref Range Status   MRSA, PCR NEGATIVE NEGATIVE Final   Staphylococcus  aureus NEGATIVE NEGATIVE Final    Comment: (NOTE) The Xpert SA Assay (FDA approved for NASAL specimens in patients 38 years of age and older), is one component of a comprehensive surveillance program. It is not intended to diagnose infection nor to guide or monitor treatment. Performed at [facility], [address] [place], [place] [address]   Culture, blood (Routine X 2) w Reflex to ID Panel     Status: None   Collection Time: 05/10/21  5:00 AM   Specimen: BLOOD  Result Value Ref Range Status   Specimen Description BLOOD BLOOD LEFT HAND  Final   Special Requests   Final    BOTTLES DRAWN AEROBIC AND ANAEROBIC Blood Culture adequate volume   Culture   Final    NO GROWTH 5 DAYS Performed at [facility], [address] [place], [place] [address]    Report Status 05/15/2021 FINAL  Final  Aerobic/Anaerobic Culture w Gram Stain (surgical/deep wound)     Status: None (Preliminary result)   Collection Time: 05/10/21 11:13 AM   Specimen: PATH Other; Tissue  Result Value Ref Range Status   Specimen Description   Final    WOUND Performed at [facility], [address], [place], [place] [address]    Special Requests   Final    RIGHT TOE Performed at [facility], [place], [place] [address]    Gram Stain   Final    NO ORGANISMS SEEN MODERATE GRAM POSITIVE COCCI FEW GRAM NEGATIVE RODS    Culture   Final    RARE PROTEUS MIRABILIS WITH IN MIXED ORGANISMS HOLDING FOR POSSIBLE ANAEROBE Performed at [facility], [place] [address], [place], [place] [address]    Report Status PENDING  Incomplete   Organism ID, Bacteria PROTEUS MIRABILIS  Final      Susceptibility   Proteus mirabilis - MIC*    AMPICILLIN <=2 SENSITIVE Sensitive     CEFAZOLIN 8 SENSITIVE Sensitive     CEFEPIME <=0.12 SENSITIVE Sensitive     CEFTAZIDIME <=1 SENSITIVE Sensitive     CEFTRIAXONE <=0.25 SENSITIVE Sensitive     CIPROFLOXACIN <=0.25 SENSITIVE  Sensitive     GENTAMICIN <=1 SENSITIVE Sensitive     IMIPENEM 2 SENSITIVE Sensitive  TRIMETH/SULFA <=20 SENSITIVE Sensitive     AMPICILLIN/SULBACTAM <=2 SENSITIVE Sensitive     PIP/TAZO <=4 SENSITIVE Sensitive     * RARE PROTEUS MIRABILIS         Radiology Studies: No results found.      Scheduled Meds:  atorvastatin  40 mg Oral QHS   insulin aspart  0-15 Units Subcutaneous TID WC   Continuous Infusions:  sodium chloride Stopped (05/13/21 1434)   ampicillin-sulbactam (UNASYN) IV 3 g (05/15/21 0535)     LOS: 6 days    Time spent: 35 minutes with more than 50% on COC    Nolberto Hanlon, MD [facility] Pager [phone]  If 7PM-7AM, please contact night-coverage 05/15/2021, 8:31 AM

## 2021-05-15 NOTE — TOC Progression Note (Signed)
Transition of Care [facility]) - Progression Note    Patient Details  Name: [name] MRN: [id number] Date of Birth: [birth year]  Transition of Care [facility]) CM/SW [place], LCSW Phone Number: 05/15/2021, 12:37 PM  Clinical Narrative: Explained PCP issues with Dr. Wynelle Cleveland, physician advisor. She said they will cover PT/OT/wound care orders at discharge. Dr. Delaine Lame willing to cover IV abx orders at discharge. [facility] is charity home health agency this week. Left voicemail for their representative regarding referral.   Expected Discharge Plan: [place] Barriers to Discharge: Inadequate or no insurance, Continued Medical Work up, Other (must enter comment) (No PCP)  Expected Discharge Plan and Services Expected Discharge Plan: [place] Choice: [place] arrangements for the past 2 months: Apartment                                       Social Determinants of Health (SDOH) Interventions    Readmission Risk Interventions No flowsheet data found.

## 2021-05-15 NOTE — Progress Notes (Signed)
Peripherally Inserted Central Catheter Placement  The IV Nurse has discussed with the patient and/or persons authorized to consent for the patient, the purpose of this procedure and the potential benefits and risks involved with this procedure.  The benefits include less needle sticks, lab draws from the catheter, and the patient may be discharged home with the catheter. Risks include, but not limited to, infection, bleeding, blood clot (thrombus formation), and puncture of an artery; nerve damage and irregular heartbeat and possibility to perform a PICC exchange if needed/ordered by physician.  Alternatives to this procedure were also discussed.  Bard Power PICC patient education guide, fact sheet on infection prevention and patient information card has been provided to patient /or left at bedside.    PICC Placement Documentation  PICC Single Lumen 0000000 Right Basilic 42 cm 1 cm (Active)  Indication for Insertion or Continuance of Line Home intravenous therapies (PICC only) 05/15/21 1444  Exposed Catheter (cm) 1 cm 05/15/21 1444  Site Assessment Clean;Dry;Intact 05/15/21 1444  Line Status Flushed;Blood return noted;Saline locked 05/15/21 1444  Dressing Type Transparent 05/15/21 1444  Dressing Status Clean;Dry;Intact 05/15/21 1444  Antimicrobial disc in place? Yes 05/15/21 1444  Dressing Intervention New dressing;Other (Comment) 05/15/21 1444  Dressing Change Due 05/22/21 05/15/21 1444       Christella Noa Albarece 05/15/2021, 2:45 PM

## 2021-05-16 LAB — GLUCOSE, CAPILLARY
Glucose-Capillary: 197 mg/dL — ABNORMAL HIGH (ref 70–99)
Glucose-Capillary: 195 mg/dL — ABNORMAL HIGH (ref 70–99)
Glucose-Capillary: 238 mg/dL — ABNORMAL HIGH (ref 70–99)
Glucose-Capillary: 172 mg/dL — ABNORMAL HIGH (ref 70–99)

## 2021-05-16 NOTE — TOC Progression Note (Signed)
Transition of Care [facility]) - Progression Note    Patient Details  Name: [name] MRN: [id number] Date of Birth: [birth year]  Transition of Care [facility]) CM/SW [place], RN Phone Number: 05/16/2021, 3:51 PM  Clinical Narrative:   Jeannene Patella from [place] states she is able to accommodate home infusions, is reaching out to [employer] for [facility] nurse, as Jackquline Denmark was unable to provide this, awaiting response from [facility].  Hospitalist team will cover [place] orders until PCP is established.    Patient requires wheelchair and wheelchair cushion measurements, tall rolling walker, 3 n 1, tub shower bench.  [facility] notified, Thedore Mins confirms they will accommodate.  TOC contact information given, TOC will follow until discharge home, which is anticipated tomorrow if all is in place.    Expected Discharge Plan: [place] Barriers to Discharge: Inadequate or no insurance, Continued Medical Work up, Other (must enter comment) (No PCP)  Expected Discharge Plan and Services Expected Discharge Plan: [place] Choice: [place] arrangements for the past 2 months: Apartment                                       Social Determinants of Health (SDOH) Interventions    Readmission Risk Interventions No flowsheet data found.

## 2021-05-16 NOTE — Progress Notes (Signed)
PROGRESS NOTE    [name]  YZJ:[id number] DOB: 1966-05-17 DOA: 05/09/2021 PCP: Pcp, No    Brief Narrative:  55 y.o. Caucasian male with medical history significant for type 2 diabetes mellitus, hypertension and dyslipidemia, who presented to the emergency room with acute onset of infected right foot wound.  Due to previous history of right foot wound and osteomyelitis he had a great toe and fifth toe amputated.  Swelling has been extending to his whole foot.  His wound was initially over the plantar aspect of his distal foot and later he developed a wound over the lateral aspect.  He now notices foul-smelling drainage and increasing pain that is becoming throbbing in graded 10/10 in severity, worsening with weightbearing.  The redness and pain has been significantly worsening despite wound care that was being provided by his friend who is a retired [employer] scientist.   Patient seen in consultation by podiatry.  MRI pursued with significant osteomyelitis and tissue loss.  Status post transmetatarsal amputation right foot.  Doing well.  Pain well controlled.  ID consulted for antibiotic recommendations.  8/17- went to OR yesterday for I&D of right foot.  No overnight issues 8/18-no overnight issues.  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 Flagyl 500 mg 3 times daily x4 weeks, end date 06/13/2021. 8/18 PICC was placed Dressing changed by podiatry yesterday Patient should still stay off right foot as much as possible and walk with heel contact with use of walker and  surgical shoe for only short distances Continue IV antibiotics     Hypercalcemia Unclear etiology, possibly from bone breakdown Improved after hydration normal saline and 1 dose Lasix C/18 last calcium level improved Continue to monitor periodically     Type 2 diabetes mellitus Home sulfonylurea has been held Adequate glycemic control 8/18 continue R-ISS    Hyperlipidemia Continue statin   DVT prophylaxis: SCDs Code Status: Full Family Communication: None today Disposition Plan: Status is: Inpatient  Remains inpatient appropriate because:Inpatient level of care appropriate due to severity of illness  Dispo: The patient is from: Home              Anticipated d/c is to: Home with home health              Patient currently is not medically stable to d/c.   Difficult to place patient No Continue IV antibiotics.  Needs home health set up prior to discharge as patient is getting infusion of antibiotics for 4 weeks.     Level of care: Med-Surg  Consultants:  Podiatry, ID  Procedures:  Transmetatarsal amputation 8/12  Antimicrobials:   IV vancomycin Zosyn-dc'd 8/16 unasyn   Subjective: No pain, no shortness of breath or chills  Objective: Vitals:   05/15/21 1959 05/16/21 0449 05/16/21 0700 05/16/21 1111  BP: (!) 137/94 (!) 144/93 (!) 158/90 (!) 147/89  Pulse: 66 66 67 72  Resp: '18 16 14 15  ' Temp: 98.7 F (37.1 C) 98.2 F (36.8 C) 98.5 F (36.9 C) 98.5 F (36.9 C)  TempSrc:  Oral    SpO2: 99% 100% 100% 100%  Weight:  Height:        Intake/Output Summary (Last 24 hours) at 05/16/2021 1339 Last data filed at 05/16/2021 1058 Gross per 24 hour  Intake --  Output 3510 ml  Net -3510 ml   Filed Weights   05/09/21 1416 05/10/21 0008  Weight: 97.5 kg 94.3 kg    Examination: Aaxox4 NAD, calm CTA no wheeze rales rhonchi's Regular S1-S2 no gallops Soft benign positive bowel sounds Right foot in dressing, left lower extremity no  edema   Data Reviewed: I have personally reviewed following labs and imaging studies  CBC: Recent Labs  Lab 05/09/21 1425 05/10/21 0500  WBC 12.8* 9.3  NEUTROABS 10.3*  --   HGB 15.0 12.7*  HCT 42.3 35.6*  MCV 93.8 94.4  PLT 244 944   Basic Metabolic Panel: Recent Labs  Lab 05/09/21 1425 05/10/21 0500 05/11/21 0618 05/13/21 0148 05/14/21 0159  NA 130* 134* 133*  --   --   K 3.4* 3.1* 3.5  --   --   CL 96* 102 102  --   --   CO2 '31 27 27  ' --   --   GLUCOSE 157* 138* 126*  --   --   BUN '12 9 8  ' --   --   CREATININE 0.70 0.66 0.58* 0.73 0.73  CALCIUM 13.5* 12.8* 11.8*  --   --    GFR: Estimated Creatinine Clearance: 124.7 mL/min (by C-G formula based on SCr of 0.73 mg/dL). Liver Function Tests: Recent Labs  Lab 05/09/21 1425  AST 52*  ALT 46*  ALKPHOS 114  BILITOT 1.3*  PROT 7.2  ALBUMIN 2.9*   No results for input(s): LIPASE, AMYLASE in the last 168 hours. No results for input(s): AMMONIA in the last 168 hours. Coagulation Profile: Recent Labs  Lab 05/09/21 1425 05/10/21 0500  INR 1.2 1.2   Cardiac Enzymes: No results for input(s): CKTOTAL, CKMB, CKMBINDEX, TROPONINI in the last 168 hours. BNP (last 3 results) No results for input(s): PROBNP in the last 8760 hours. HbA1C: No results for input(s): HGBA1C in the last 72 hours.  CBG: Recent Labs  Lab 05/15/21 0800 05/15/21 1116 05/15/21 1657 05/16/21 0720 05/16/21 1110  GLUCAP 227* 211* 152* 197* 195*   Lipid Profile: No results for input(s): CHOL, HDL, LDLCALC, TRIG, CHOLHDL, LDLDIRECT in the last 72 hours. Thyroid Function Tests: No results for input(s): TSH, T4TOTAL, FREET4, T3FREE, THYROIDAB in the last 72 hours. Anemia Panel: No results for input(s): VITAMINB12, FOLATE, FERRITIN, TIBC, IRON, RETICCTPCT in the last 72 hours. Sepsis Labs: Recent Labs  Lab 05/09/21 1425 05/10/21 0500  PROCALCITON  --  3.18  LATICACIDVEN 1.9  --     Recent Results (from the past 240 hour(s))   Culture, blood (Routine x 2)     Status: None   Collection Time: 05/09/21  2:25 PM   Specimen: BLOOD  Result Value Ref Range Status   Specimen Description BLOOD BLOOD RIGHT FOREARM  Final   Special Requests   Final    BOTTLES DRAWN AEROBIC AND ANAEROBIC Blood Culture adequate volume   Culture   Final    NO GROWTH 5 DAYS Performed at [facility], [address] [place], [place] [address]    Report Status 05/14/2021 FINAL  Final  SARS CORONAVIRUS 2 (TAT 6-24 HRS) Nasopharyngeal Nasopharyngeal Swab     Status: None   Collection Time: 05/09/21 10:05 PM   Specimen: Nasopharyngeal Swab  Result Value Ref Range Status   SARS Coronavirus 2  NEGATIVE NEGATIVE Final    Comment: (NOTE) SARS-CoV-2 target nucleic acids are NOT DETECTED.  The SARS-CoV-2 RNA is generally detectable in upper and lower respiratory specimens during the acute phase of infection. Negative results do not preclude SARS-CoV-2 infection, do not rule out co-infections with other pathogens, and should not be used as the sole basis for treatment or other patient management decisions. Negative results must be combined with clinical observations, patient history, and epidemiological information. The expected result is Negative.  Fact Sheet for Patients: SugarRoll.be  Fact Sheet for Healthcare Providers: https://www.woods-mathews.com/  This test is not yet approved or cleared by the [place] FDA and  has been authorized for detection and/or diagnosis of SARS-CoV-2 by FDA under an Emergency Use Authorization (EUA). This EUA will remain  in effect (meaning this test can be used) for the duration of the COVID-19 declaration under Se ction 564(b)(1) of the Act, 21 U.S.C. section 360bbb-3(b)(1), unless the authorization is terminated or revoked sooner.  Performed at [facility], [place] [address] [place] [address], [place], [place] [address]   Surgical PCR screen     Status: None    Collection Time: 05/10/21  2:29 AM   Specimen: Nasal Mucosa; Nasal Swab  Result Value Ref Range Status   MRSA, PCR NEGATIVE NEGATIVE Final   Staphylococcus aureus NEGATIVE NEGATIVE Final    Comment: (NOTE) The Xpert SA Assay (FDA approved for NASAL specimens in patients 86 years of age and older), is one component of a comprehensive surveillance program. It is not intended to diagnose infection nor to guide or monitor treatment. Performed at [facility], [address] [place], [place] [address]   Culture, blood (Routine X 2) w Reflex to ID Panel     Status: None   Collection Time: 05/10/21  5:00 AM   Specimen: BLOOD  Result Value Ref Range Status   Specimen Description BLOOD BLOOD LEFT HAND  Final   Special Requests   Final    BOTTLES DRAWN AEROBIC AND ANAEROBIC Blood Culture adequate volume   Culture   Final    NO GROWTH 5 DAYS Performed at [facility], [address] [place], [place] [address]    Report Status 05/15/2021 FINAL  Final  Aerobic/Anaerobic Culture w Gram Stain (surgical/deep wound)     Status: None   Collection Time: 05/10/21 11:13 AM   Specimen: PATH Other; Tissue  Result Value Ref Range Status   Specimen Description   Final    WOUND Performed at [facility], [address] [place], [place] [address]    Special Requests   Final    RIGHT TOE Performed at [facility], [address] [place], [place] [address]    Gram Stain   Final    NO ORGANISMS SEEN MODERATE GRAM POSITIVE COCCI FEW GRAM NEGATIVE RODS    Culture   Final    RARE PROTEUS MIRABILIS WITH IN MIXED ORGANISMS ABUNDANT BACTEROIDES SPECIES BETA LACTAMASE NEGATIVE Performed at [facility], [place] [address] [place], [place] [address]    Report Status 05/15/2021 FINAL  Final   Organism ID, Bacteria PROTEUS MIRABILIS  Final      Susceptibility   Proteus mirabilis - MIC*    AMPICILLIN <=2 SENSITIVE Sensitive     CEFAZOLIN 8 SENSITIVE  Sensitive     CEFEPIME <=0.12 SENSITIVE Sensitive     CEFTAZIDIME <=1 SENSITIVE Sensitive     CEFTRIAXONE <=0.25 SENSITIVE Sensitive     CIPROFLOXACIN <=0.25 SENSITIVE Sensitive  GENTAMICIN <=1 SENSITIVE Sensitive     IMIPENEM 2 SENSITIVE Sensitive     TRIMETH/SULFA <=20 SENSITIVE Sensitive     AMPICILLIN/SULBACTAM <=2 SENSITIVE Sensitive     PIP/TAZO <=4 SENSITIVE Sensitive     * RARE PROTEUS MIRABILIS         Radiology Studies: [place] EKG SITE RITE  Result Date: 05/15/2021 If Site Rite image not attached, placement could not be confirmed due to current cardiac rhythm.       Scheduled Meds:  atorvastatin  40 mg Oral QHS   Chlorhexidine Gluconate Cloth  6 each Topical Daily   insulin aspart  0-15 Units Subcutaneous TID WC   metroNIDAZOLE  500 mg Oral Q8H   sodium chloride flush  10-40 mL Intracatheter Q12H   Continuous Infusions:  sodium chloride Stopped (05/13/21 1434)   cefTRIAXone (ROCEPHIN)  IV 2 g (05/16/21 0840)     LOS: 7 days    Time spent: 35 minutes with more than 50% on COC    Nolberto Hanlon, MD [facility] Pager [phone]  If 7PM-7AM, please contact night-coverage 05/16/2021, 1:39 PM

## 2021-05-16 NOTE — Progress Notes (Signed)
Dressing change performed today.  Distal plantar portion of the incision was reopened.  Serosanguineous and bloody drainage was noted.  No purulence today. No erythema to the foot at this time.  Recommend nonweightbearing to the foot.  Only weightbearing to the heel as needed for transfer.  Dressing changes 3 times a week.  Cleanse wound with saline.  Paint areas with Betadine as needed for maceration.  Cover with bulky sterile bandage.  Follow-up with podiatry in 2 weeks.  From podiatry standpoint patient stable for discharge.

## 2021-05-16 NOTE — Progress Notes (Signed)
Physical Therapy Treatment Patient Details Name: [name] MRN: [id number] DOB: 1966/09/13 Today's Date: 05/16/2021    History of Present Illness 55 y.o. Caucasian male with medical history significant for type 2 diabetes mellitus, hypertension and dyslipidemia, who presented to the emergency room with acute onset of infected right foot wound.  Due to previous history of right foot wound and osteomyelitis he had a great toe and fifth toe amputated.  Swelling has been extending to his whole foot.  His wound was initially over the plantar aspect of his distal foot and later he developed a wound over the lateral aspect.  He now notices foul-smelling drainage and increasing pain that is becoming throbbing in graded 10/10 in severity, worsening with weightbearing.  The redness and pain has been significantly worsening despite wound care that was being provided by his friend who is a retired [employer] scientist. 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    PT Comments    Pt was long sitting in bed upon arriving. He is alert and oriented and cooperative throughout. Pt was easily able to exit R side of bed, stand to RW, and ambulate ("hop" ) ~ 50 ft without safety concern. Was able to adhere to proper wt bearing throughout. Safely demonstrated ability to ascend/descend step 2 x without safety concern. Acute PT recommends HHPT at DC to address deficits while maximizing independence with ADLs.    Follow Up Recommendations  Home health PT     Equipment Recommendations  Wheelchair (measurements PT);Wheelchair cushion (measurements PT);3in1 (PT)       Precautions / Restrictions Precautions Precautions: Fall Restrictions Weight Bearing Restrictions: Yes RLE Weight Bearing: Non weight bearing    Mobility  Bed Mobility Overal bed mobility: Modified Independent Bed Mobility: Supine to Sit     Supine to sit: Supervision          Transfers Overall  transfer level: Needs assistance Equipment used: Rolling walker (2 wheeled) Transfers: Sit to/from Stand Sit to Stand: Supervision            Ambulation/Gait Ambulation/Gait assistance: Supervision Gait Distance (Feet): 50 Feet Assistive device: Rolling walker (2 wheeled) Gait Pattern/deviations:  (" hop to.") Gait velocity: Decreased   General Gait Details: pt was able to "hop" ~ 50 ft without LOB or safety concern did take 2 standing rest but overall demonstrates safe steady gait kinemtaics.   Stairs Stairs: Yes Stairs assistance: Min guard Stair Management: No rails;With walker (" hop to.") Number of Stairs: 1 General stair comments: " pt was able to hop up/down curb step without difficulty      Balance Overall balance assessment: Needs assistance Sitting-balance support: Feet supported;No upper extremity supported Sitting balance-Leahy Scale: Good     Standing balance support: Bilateral upper extremity supported;During functional activity Standing balance-Leahy Scale: Good        Cognition Arousal/Alertness: Awake/alert Behavior During Therapy: WFL for tasks assessed/performed Overall Cognitive Status: Within Functional Limits for tasks assessed               Pertinent Vitals/Pain Pain Assessment: No/denies pain Pain Score: 0-No pain Pain Location: R foot Pain Descriptors / Indicators: Tender Pain Intervention(s): Limited activity within patient's tolerance;Monitored during session;Premedicated before session;Repositioned     PT Goals (current goals can now be found in the care plan section) Acute Rehab PT Goals Patient Stated Goal: go home and heal up Progress towards PT goals: Progressing toward goals    Frequency    7X/week  PT Plan Current plan remains appropriate       AM-PAC PT "6 Clicks" Mobility   Outcome Measure  Help needed turning from your back to your side while in a flat bed without using bedrails?: None Help needed  moving from lying on your back to sitting on the side of a flat bed without using bedrails?: None Help needed moving to and from a bed to a chair (including a wheelchair)?: A Little Help needed standing up from a chair using your arms (e.g., wheelchair or bedside chair)?: A Little Help needed to walk in hospital room?: A Little Help needed climbing 3-5 steps with a railing? : A Little 6 Click Score: (P) 20    End of Session Equipment Utilized During Treatment: Gait belt Activity Tolerance: Patient tolerated treatment well Patient left: with call bell/phone within reach;in bed Nurse Communication: Mobility status PT Visit Diagnosis: Unsteadiness on feet (R26.81);Other abnormalities of gait and mobility (R26.89);Muscle weakness (generalized) (M62.81);Difficulty in walking, not elsewhere classified (R26.2)     Time: 1040-1105 PT Time Calculation (min) (ACUTE ONLY): 25 min  Charges:  $Gait Training: 23-37 mins                    Julaine Fusi PTA 05/16/21, 4:30 PM

## 2021-05-16 NOTE — Plan of Care (Signed)
No acute events overnight. Problem: Education: Goal: Knowledge of General Education information will improve Description: Including pain rating scale, medication(s)/side effects and non-pharmacologic comfort measures Outcome: Progressing   Problem: Health Behavior/Discharge Planning: Goal: Ability to manage health-related needs will improve Outcome: Progressing   Problem: Clinical Measurements: Goal: Will remain free from infection Outcome: Progressing Goal: Diagnostic test results will improve Outcome: Progressing   Problem: Activity: Goal: Risk for activity intolerance will decrease Outcome: Progressing   Problem: Pain Managment: Goal: General experience of comfort will improve Outcome: Progressing   Problem: Safety: Goal: Ability to remain free from injury will improve Outcome: Progressing   Problem: Skin Integrity: Goal: Risk for impaired skin integrity will decrease Outcome: Progressing   Problem: Clinical Measurements: Goal: Ability to avoid or minimize complications of infection will improve Outcome: Progressing   Problem: Skin Integrity: Goal: Skin integrity will improve Outcome: Progressing

## 2021-05-17 DIAGNOSIS — E1165 Type 2 diabetes mellitus with hyperglycemia: Secondary | ICD-10-CM

## 2021-05-17 DIAGNOSIS — I10 Essential (primary) hypertension: Secondary | ICD-10-CM

## 2021-05-17 LAB — GLUCOSE, CAPILLARY
Glucose-Capillary: 185 mg/dL — ABNORMAL HIGH (ref 70–99)
Glucose-Capillary: 236 mg/dL — ABNORMAL HIGH (ref 70–99)
Glucose-Capillary: 244 mg/dL — ABNORMAL HIGH (ref 70–99)
Glucose-Capillary: 151 mg/dL — ABNORMAL HIGH (ref 70–99)

## 2021-05-17 NOTE — Progress Notes (Signed)
PROGRESS NOTE    [name]  TKP:[id number] DOB: 1966-08-22 DOA: 05/09/2021 PCP: Pcp, No    Brief Narrative:  55 y.o. Caucasian male with medical history significant for type 2 diabetes mellitus, hypertension and dyslipidemia, who presented to the emergency room with acute onset of infected right foot wound.  Due to previous history of right foot wound and osteomyelitis he had a great toe and fifth toe amputated.  Swelling has been extending to his whole foot.  His wound was initially over the plantar aspect of his distal foot and later he developed a wound over the lateral aspect.  He now notices foul-smelling drainage and increasing pain that is becoming throbbing in graded 10/10 in severity, worsening with weightbearing.  The redness and pain has been significantly worsening despite wound care that was being provided by his friend who is a retired [employer] scientist.   Patient seen in consultation by podiatry.  MRI pursued with significant osteomyelitis and tissue loss.  Status post transmetatarsal amputation right foot.  Doing well.  Pain well controlled.  ID consulted for antibiotic recommendations.  8/17- went to OR yesterday for I&D of right foot.  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 Flagyl 500 mg 3 times daily x4 weeks, end date 06/13/2021. PICC was placed 8/19 podiatry following.  Dressing changed today.  No purulence noted. Podiatry recommended nonweightbearing to the foot.  Only  weightbearing to the heel as needed for transfer.  Dressing changes 3 times a week.  Cleanse wound with saline.  Paint areas with Betadine as needed for maceration.  Cover with bulky sterile bandage. Follow-up with podiatry in 2 weeks Stable for discharge from podiatry standpoint Continue IV antibiotics   HTN- bp consistently elevated Will start on amlodipine 38m qd   Hypercalcemia Unclear etiology, possibly from bone breakdown Improved after hydration normal saline and 1 dose Lasix 8/19 last calcium level had improved.  Continue to monitor periodically    Type 2 diabetes mellitus Home sulfonylurea has been held 8/19 BG overall stable at times it becomes elevated Continue R-ISS    Hyperlipidemia Continue statins   DVT prophylaxis: SCDs Code Status: Full Family Communication: None today Disposition Plan: Status is: Inpatient  Remains inpatient appropriate because:Inpatient level of care appropriate due to severity of illness  Dispo: The patient is from: Home              Anticipated d/c is to: Home with home health              Patient currently is not medically stable to d/c.   Difficult to place patient No Continue IV antibiotics.  Needs home health set up prior to discharge as patient is getting infusion of antibiotics for 4 weeks.  Case management working on this     Level of care: Med-Surg  Consultants:  Podiatry, ID  Procedures:  Transmetatarsal amputation 8/12  Antimicrobials:   IV vancomycin Zosyn-dc'd 8/16 unasyn   Subjective: Has no pain, shortness of breath, or chills findings  Objective: Vitals:   05/16/21 1517 05/16/21 2005 05/17/21 0344 05/17/21 0731  BP: (!) 139/95 (!) 144/89 (!) 144/92 (!) 143/92  Pulse: 72 67 62 66  Resp: '15 16 15 15  ' Temp: 98.5 F (36.9 C) 98.4 F (36.9 C) 99 F (37.2 C) 98.2 F (36.8 C)  TempSrc:  Oral    SpO2: 98% 100% 100% 98%  Weight:      Height:        Intake/Output Summary (Last 24 hours) at  05/17/2021 1152 Last data filed at 05/17/2021 0900 Gross per 24 hour  Intake 720 ml  Output 2635 ml  Net -1915 ml   Filed Weights   05/09/21 1416 05/10/21 0008  Weight: 97.5 kg 94.3 kg    Examination: Aaxoxo4 NAD, calm CTA no wheeze rales rhonchi's Regular S1-S2 no gallops Soft benign positive bowel sounds Right foot dressing in place, left lower extremity without pain or edema  Data Reviewed: I have personally reviewed following labs and imaging studies  CBC: No results for input(s): WBC, NEUTROABS, HGB, HCT, MCV, PLT in the last 168 hours.  Basic Metabolic Panel: Recent Labs  Lab 05/11/21 0618 05/13/21 0148 05/14/21 0159  NA 133*  --   --   K 3.5  --   --   CL 102  --   --   CO2 27  --   --   GLUCOSE 126*  --   --   BUN 8  --   --   CREATININE 0.58* 0.73 0.73  CALCIUM 11.8*  --   --    GFR: Estimated Creatinine Clearance: 124.7 mL/min (by C-G formula based on SCr of 0.73 mg/dL). Liver Function Tests: No results for input(s): AST, ALT, ALKPHOS, BILITOT, PROT, ALBUMIN in the last 168 hours.  No results for input(s): LIPASE, AMYLASE in the last 168 hours. No results for input(s): AMMONIA in the last 168 hours. Coagulation Profile: No results for input(s): INR, PROTIME in the last 168 hours.  Cardiac Enzymes: No results for input(s): CKTOTAL, CKMB, CKMBINDEX, TROPONINI in the last 168 hours. BNP (last 3 results) No results for input(s): PROBNP in the last 8760 hours. HbA1C: No results for input(s): HGBA1C in the last 72 hours.  CBG: Recent Labs  Lab 05/16/21 0720 05/16/21 1110 05/16/21 1634 05/16/21 2207 05/17/21 0730  GLUCAP 197* 195* 238* 172* 185*   Lipid Profile: No results for input(s): CHOL, HDL, LDLCALC, TRIG, CHOLHDL, LDLDIRECT in the last 72 hours. Thyroid Function Tests: No results for input(s): TSH, T4TOTAL, FREET4, T3FREE, THYROIDAB in the last 72 hours. Anemia Panel: No results for input(s): VITAMINB12, FOLATE, FERRITIN, TIBC, IRON,  RETICCTPCT in the last 72 hours. Sepsis Labs: No results for input(s): PROCALCITON, LATICACIDVEN in the last 168 hours.   Recent Results (from the past 240 hour(s))  Culture, blood (Routine x 2)     Status: None   Collection Time: 05/09/21  2:25 PM   Specimen: BLOOD  Result Value Ref Range Status   Specimen Description BLOOD BLOOD RIGHT FOREARM  Final   Special Requests   Final    BOTTLES DRAWN AEROBIC AND ANAEROBIC Blood Culture adequate volume   Culture   Final    NO GROWTH 5 DAYS Performed at [facility], [address], [place], [place] [address]    Report Status 05/14/2021 FINAL  Final  SARS CORONAVIRUS 2 (TAT 6-24  HRS) Nasopharyngeal Nasopharyngeal Swab     Status: None   Collection Time: 05/09/21 10:05 PM   Specimen: Nasopharyngeal Swab  Result Value Ref Range Status   SARS Coronavirus 2 NEGATIVE NEGATIVE Final    Comment: (NOTE) SARS-CoV-2 target nucleic acids are NOT DETECTED.  The SARS-CoV-2 RNA is generally detectable in upper and lower respiratory specimens during the acute phase of infection. Negative results do not preclude SARS-CoV-2 infection, do not rule out co-infections with other pathogens, and should not be used as the sole basis for treatment or other patient management decisions. Negative results must be combined with clinical observations, patient history, and epidemiological information. The expected result is Negative.  Fact Sheet for Patients: SugarRoll.be  Fact Sheet for Healthcare Providers: https://www.woods-mathews.com/  This test is not yet approved or cleared by the [place] FDA and  has been authorized for detection and/or diagnosis of SARS-CoV-2 by FDA under an Emergency Use Authorization (EUA). This EUA will remain  in effect (meaning this test can be used) for the duration of the COVID-19 declaration under Se ction 564(b)(1) of the Act, 21 U.S.C. section 360bbb-3(b)(1), unless  the authorization is terminated or revoked sooner.  Performed at [facility], [place] [address], [place], [place] [address]   Surgical PCR screen     Status: None   Collection Time: 05/10/21  2:29 AM   Specimen: Nasal Mucosa; Nasal Swab  Result Value Ref Range Status   MRSA, PCR NEGATIVE NEGATIVE Final   Staphylococcus aureus NEGATIVE NEGATIVE Final    Comment: (NOTE) The Xpert SA Assay (FDA approved for NASAL specimens in patients 34 years of age and older), is one component of a comprehensive surveillance program. It is not intended to diagnose infection nor to guide or monitor treatment. Performed at [facility], [address] [place], [place] [address]   Culture, blood (Routine X 2) w Reflex to ID Panel     Status: None   Collection Time: 05/10/21  5:00 AM   Specimen: BLOOD  Result Value Ref Range Status   Specimen Description BLOOD BLOOD LEFT HAND  Final   Special Requests   Final    BOTTLES DRAWN AEROBIC AND ANAEROBIC Blood Culture adequate volume   Culture   Final    NO GROWTH 5 DAYS Performed at [facility], [address], [place], [place] [address]    Report Status 05/15/2021 FINAL  Final  Aerobic/Anaerobic Culture w Gram Stain (surgical/deep wound)     Status: None   Collection Time: 05/10/21 11:13 AM   Specimen: PATH Other; Tissue  Result Value Ref Range Status   Specimen Description   Final    WOUND Performed at [facility], [address] [place], [place] [address]    Special Requests   Final    RIGHT TOE Performed at [facility], [address] [place], [place] [address]    Gram Stain   Final    NO ORGANISMS SEEN MODERATE GRAM POSITIVE COCCI FEW GRAM NEGATIVE RODS    Culture   Final    RARE PROTEUS MIRABILIS WITH IN MIXED ORGANISMS ABUNDANT BACTEROIDES SPECIES BETA LACTAMASE NEGATIVE Performed at [facility], [place] [address], [place], [place] [address]    Report Status 05/15/2021 FINAL   Final   Organism ID, Bacteria PROTEUS MIRABILIS  Final      Susceptibility   Proteus mirabilis - MIC*    AMPICILLIN <=2 SENSITIVE Sensitive     CEFAZOLIN 8 SENSITIVE  Sensitive     CEFEPIME <=0.12 SENSITIVE Sensitive     CEFTAZIDIME <=1 SENSITIVE Sensitive     CEFTRIAXONE <=0.25 SENSITIVE Sensitive     CIPROFLOXACIN <=0.25 SENSITIVE Sensitive     GENTAMICIN <=1 SENSITIVE Sensitive     IMIPENEM 2 SENSITIVE Sensitive     TRIMETH/SULFA <=20 SENSITIVE Sensitive     AMPICILLIN/SULBACTAM <=2 SENSITIVE Sensitive     PIP/TAZO <=4 SENSITIVE Sensitive     * RARE PROTEUS MIRABILIS         Radiology Studies: [place] EKG SITE RITE  Result Date: 05/15/2021 If Site Rite image not attached, placement could not be confirmed due to current cardiac rhythm.       Scheduled Meds:  atorvastatin  40 mg Oral QHS   Chlorhexidine Gluconate Cloth  6 each Topical Daily   insulin aspart  0-15 Units Subcutaneous TID WC   metroNIDAZOLE  500 mg Oral Q8H   sodium chloride flush  10-40 mL Intracatheter Q12H   Continuous Infusions:  sodium chloride Stopped (05/13/21 1434)   cefTRIAXone (ROCEPHIN)  IV 2 g (05/17/21 0822)     LOS: 8 days    Time spent: 35 minutes with more than 50% on [place], MD [facility] Pager [phone]  If 7PM-7AM, please contact night-coverage 05/17/2021, 11:52 AM

## 2021-05-17 NOTE — Progress Notes (Signed)
Physical Therapy Treatment Patient Details Name: [name] MRN: [id number] DOB: 05-19-1966 Today's Date: 05/17/2021    History of Present Illness 55 y.o. Caucasian male with medical history significant for type 2 diabetes mellitus, hypertension and dyslipidemia, who presented to the emergency room with acute onset of infected right foot wound.  Due to previous history of right foot wound and osteomyelitis he had a great toe and fifth toe amputated.  Swelling has been extending to his whole foot.  His wound was initially over the plantar aspect of his distal foot and later he developed a wound over the lateral aspect.  He now notices foul-smelling drainage and increasing pain that is becoming throbbing in graded 10/10 in severity, worsening with weightbearing.  The redness and pain has been significantly worsening despite wound care that was being provided by his friend who is a retired [employer] scientist. 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    PT Comments    Pt received side lying in bed, agreeable to therapy. He performed single LE ambulation (LLE only) using RW via "hop" to method. He was able to increase ambulation distance from 50 to 37f today. He demo fatigue midway requiring multiple standing rest breaks and decreased foot clearance during hop. He reports fatigue in BUE. He demo mild instability in standing, especially with fatigue, and decreased functional endurance. Would benefit from skilled PT to address above deficits and promote optimal return to PLOF.     Follow Up Recommendations  Home health PT     Equipment Recommendations  Wheelchair (measurements PT);Wheelchair cushion (measurements PT);3in1 (PT)    Recommendations for Other Services       Precautions / Restrictions Precautions Precautions: Fall Restrictions Weight Bearing Restrictions: Yes RLE Weight Bearing: Non weight bearing    Mobility  Bed Mobility Overal bed  mobility: Modified Independent Bed Mobility: Supine to Sit;Sit to Supine           General bed mobility comments: increased time, use of bed rails    Transfers Overall transfer level: Needs assistance Equipment used: Rolling walker (2 wheeled) Transfers: Sit to/from Stand Sit to Stand: Supervision            Ambulation/Gait Ambulation/Gait assistance: Supervision Gait Distance (Feet): 80 Feet Assistive device: Rolling walker (2 wheeled)   Gait velocity: Decreased   General Gait Details: "hop" on LLE using RW; took multiple standing rest breaks; hop became shorter with decreased clearance as pt fatigued. No LOB, mild unsteadiness.   Stairs             Wheelchair Mobility    Modified Rankin (Stroke Patients Only)       Balance Overall balance assessment: Needs assistance Sitting-balance support: Feet supported;No upper extremity supported Sitting balance-Leahy Scale: Good     Standing balance support: Bilateral upper extremity supported;During functional activity Standing balance-Leahy Scale: Fair Standing balance comment: F static and dynamic standing with BUE on RW - occasional mild decrease in steadiness, pt able to self correct                            Cognition Arousal/Alertness: Awake/alert Behavior During Therapy: WFL for tasks assessed/performed Overall Cognitive Status: Within Functional Limits for tasks assessed  Exercises      General Comments        Pertinent Vitals/Pain Pain Assessment: No/denies pain    Home Living                      Prior Function            PT Goals (current goals can now be found in the care plan section) Acute Rehab PT Goals Patient Stated Goal: go home and heal up    Frequency    7X/week      PT Plan      Co-evaluation              AM-PAC PT "6 Clicks" Mobility   Outcome Measure  Help needed turning  from your back to your side while in a flat bed without using bedrails?: None Help needed moving from lying on your back to sitting on the side of a flat bed without using bedrails?: None Help needed moving to and from a bed to a chair (including a wheelchair)?: A Little Help needed standing up from a chair using your arms (e.g., wheelchair or bedside chair)?: A Little Help needed to walk in hospital room?: A Little Help needed climbing 3-5 steps with a railing? : A Little 6 Click Score: 20    End of Session Equipment Utilized During Treatment: Gait belt Activity Tolerance: Patient tolerated treatment well;Patient limited by fatigue Patient left: with call bell/phone within reach;in bed   PT Visit Diagnosis: Unsteadiness on feet (R26.81);Other abnormalities of gait and mobility (R26.89);Muscle weakness (generalized) (M62.81);Difficulty in walking, not elsewhere classified (R26.2)     Time: [id number] PT Time Calculation (min) (ACUTE ONLY): 10 min  Charges:  $Therapeutic Activity: 8-22 mins                     Patrina Levering PT, DPT 05/17/21 1:57 PM [phone]

## 2021-05-17 NOTE — Plan of Care (Signed)
Pt denies pain this shift. No acute events, anticipates discharge 05/17/2021. Problem: Education: Goal: Knowledge of General Education information will improve Description: Including pain rating scale, medication(s)/side effects and non-pharmacologic comfort measures Outcome: Progressing   Problem: Health Behavior/Discharge Planning: Goal: Ability to manage health-related needs will improve Outcome: Progressing   Problem: Clinical Measurements: Goal: Will remain free from infection Outcome: Progressing Goal: Diagnostic test results will improve Outcome: Progressing   Problem: Activity: Goal: Risk for activity intolerance will decrease Outcome: Progressing   Problem: Pain Managment: Goal: General experience of comfort will improve Outcome: Progressing   Problem: Safety: Goal: Ability to remain free from injury will improve Outcome: Progressing   Problem: Skin Integrity: Goal: Risk for impaired skin integrity will decrease Outcome: Progressing   Problem: Clinical Measurements: Goal: Ability to avoid or minimize complications of infection will improve Outcome: Progressing   Problem: Skin Integrity: Goal: Skin integrity will improve Outcome: Progressing

## 2021-05-17 NOTE — Plan of Care (Signed)
  Problem: Education: Goal: Knowledge of General Education information will improve Description: Including pain rating scale, medication(s)/side effects and non-pharmacologic comfort measures Outcome: Progressing   Problem: Health Behavior/Discharge Planning: Goal: Ability to manage health-related needs will improve Outcome: Progressing   Problem: Clinical Measurements: Goal: Will remain free from infection Outcome: Progressing Goal: Diagnostic test results will improve Outcome: Progressing   Problem: Activity: Goal: Risk for activity intolerance will decrease Outcome: Progressing   Problem: Pain Managment: Goal: General experience of comfort will improve Outcome: Progressing   Problem: Safety: Goal: Ability to remain free from injury will improve Outcome: Progressing   Problem: Skin Integrity: Goal: Risk for impaired skin integrity will decrease Outcome: Progressing   Problem: Clinical Measurements: Goal: Ability to avoid or minimize complications of infection will improve Outcome: Progressing   Problem: Skin Integrity: Goal: Skin integrity will improve Outcome: Progressing

## 2021-05-18 LAB — GLUCOSE, CAPILLARY
Glucose-Capillary: 219 mg/dL — ABNORMAL HIGH (ref 70–99)
Glucose-Capillary: 202 mg/dL — ABNORMAL HIGH (ref 70–99)
Glucose-Capillary: 184 mg/dL — ABNORMAL HIGH (ref 70–99)
Glucose-Capillary: 227 mg/dL — ABNORMAL HIGH (ref 70–99)

## 2021-05-18 NOTE — Progress Notes (Signed)
Occupational Therapy Treatment Patient Details Name: [name] MRN: [id number] DOB: Dec 26, 1965 Today's Date: 05/18/2021    History of present illness 55 y.o. Caucasian male with medical history significant for type 2 diabetes mellitus, hypertension and dyslipidemia, who presented to the emergency room with acute onset of infected right foot wound.  Due to previous history of right foot wound and osteomyelitis he had a great toe and fifth toe amputated.  Swelling has been extending to his whole foot.  His wound was initially over the plantar aspect of his distal foot and later he developed a wound over the lateral aspect.  He now notices foul-smelling drainage and increasing pain that is becoming throbbing in graded 10/10 in severity, worsening with weightbearing.  The redness and pain has been significantly worsening despite wound care that was being provided by his friend who is a retired Marine scientist. 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   OT comments  Pt. performed BUE strengthening using blue theraband for bilateral shoulder flexion, horizontal abduction, diagonal abduction,external rotation, elbow flexion, and extension following a HEP. Pt. education was provided about form, technique, and pace. Reviewed pt. Home routines, and anticipated needs upon discharge. Pt. Continues to benefit from OT services for ADL training, A/E training, and pt. Education about UE there. Ex, home modification, and DME.    Follow Up Recommendations  Home health OT    Equipment Recommendations  3 in 1 bedside commode;Tub/shower bench;Toilet rise with handles;Wheelchair cushion (measurements OT);Wheelchair (measurements OT)    Recommendations for Other Services      Precautions / Restrictions Precautions Precautions: Fall Restrictions Weight Bearing Restrictions: Yes RLE Weight Bearing: Non weight bearing       Mobility Bed Mobility Overal bed mobility:  Modified Independent       Supine to sit: Independent          Transfers Overall transfer level: Needs assistance Equipment used: Rolling walker (2 wheeled) Transfers: Sit to/from Stand Sit to Stand: Supervision   Squat pivot transfers: Supervision          Balance Overall balance assessment: Needs assistance Sitting-balance support: Feet supported;No upper extremity supported Sitting balance-Leahy Scale: Good     Standing balance support: Bilateral upper extremity supported;During functional activity Standing balance-Leahy Scale: Good                             ADL either performed or assessed with clinical judgement   ADL Overall ADL's : Needs assistance/impaired                 Upper Body Dressing : Independent                           Vision Baseline Vision/History: Wears glasses     Perception     Praxis      Cognition Arousal/Alertness: Awake/alert Behavior During Therapy: WFL for tasks assessed/performed Overall Cognitive Status: Within Functional Limits for tasks assessed                                 General Comments: appropriate, pleasant        Exercises     Shoulder Instructions       General Comments      Pertinent Vitals/ Pain       Pain Assessment: No/denies pain Pain  Score: 0-No pain  Home Living                                          Prior Functioning/Environment              Frequency  Min 2X/week        Progress Toward Goals  OT Goals(current goals can now be found in the care plan section)  Progress towards OT goals: Progressing toward goals  Acute Rehab OT Goals Patient Stated Goal: Return home OT Goal Formulation: With patient Potential to Achieve Goals: Good  Plan Frequency remains appropriate;Discharge plan needs to be updated    Co-evaluation        PT goals addressed during session: Mobility/safety with  mobility;Balance;Strengthening/ROM;Proper use of DME        AM-PAC OT "6 Clicks" Daily Activity     Outcome Measure   Help from another person eating meals?: None Help from another person taking care of personal grooming?: A Little Help from another person toileting, which includes using toliet, bedpan, or urinal?: A Little Help from another person bathing (including washing, rinsing, drying)?: A Little Help from another person to put on and taking off regular upper body clothing?: None Help from another person to put on and taking off regular lower body clothing?: A Little 6 Click Score: 20    End of Session    OT Visit Diagnosis: Unsteadiness on feet (R26.81);Other abnormalities of gait and mobility (R26.89);Muscle weakness (generalized) (M62.81);Pain Pain - Right/Left: Right Pain - part of body: Ankle and joints of foot   Activity Tolerance Patient tolerated treatment well   Patient Left with call bell/phone within reach;Other (comment)   Nurse Communication Mobility status;Other (comment)        [id number] OT Time Calculation (min): 15 min  Charges: OT General Charges $OT Visit: 1 Visit OT Treatments $Self Care/Home Management : 8-22 mins  Harrel Carina, MS, OTR/L   Harrel Carina 05/18/2021, 2:33 PM

## 2021-05-18 NOTE — Progress Notes (Signed)
Physical Therapy Treatment Patient Details Name: [name] MRN: [id number] DOB: 1966/07/05 Today's Date: 05/18/2021    History of Present Illness 55 y.o. Caucasian male with medical history significant for type 2 diabetes mellitus, hypertension and dyslipidemia, who presented to the emergency room with acute onset of infected right foot wound.  Due to previous history of right foot wound and osteomyelitis he had a great toe and fifth toe amputated.  Swelling has been extending to his whole foot.  His wound was initially over the plantar aspect of his distal foot and later he developed a wound over the lateral aspect.  He now notices foul-smelling drainage and increasing pain that is becoming throbbing in graded 10/10 in severity, worsening with weightbearing.  The redness and pain has been significantly worsening despite wound care that was being provided by his friend who is a retired Marine scientist. 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    PT Comments    Pt was supine in bed upon arriving. He is A and O x 4. " I'm still waiting for a RN to be able to do my medicine at home." He was easily able to exit bed, stand, and "hop" ~ 80 ft with RW. Continues to demonstrate improving endurance and safety. Pt did have post op shoe donned throughout with personal shoe on opposite LE. Recommend HHPT to continue to progress pt to PLOF.   Follow Up Recommendations  Home health PT     Equipment Recommendations  Wheelchair (measurements PT);Wheelchair cushion (measurements PT);3in1 (PT) (w/c for longer distances)       Precautions / Restrictions Precautions Precautions: Fall Restrictions Weight Bearing Restrictions: Yes RLE Weight Bearing: Non weight bearing    Mobility  Bed Mobility Overal bed mobility: Modified Independent   Transfers Overall transfer level: Needs assistance Equipment used: Rolling walker (2 wheeled) Transfers: Sit to/from  Stand Sit to Stand: Supervision   Squat pivot transfers: Supervision        Ambulation/Gait Ambulation/Gait assistance: Supervision Gait Distance (Feet): 80 Feet Assistive device: Rolling walker (2 wheeled) Gait Pattern/deviations:  (" hop to.") Gait velocity: Decreased   General Gait Details: Pt was able to ambulate 80 ft with RW while maintianing proper wt bearing throughout     Balance Overall balance assessment: Needs assistance Sitting-balance support: Feet supported;No upper extremity supported Sitting balance-Leahy Scale: Good     Standing balance support: Bilateral upper extremity supported;During functional activity Standing balance-Leahy Scale: Good      Cognition Arousal/Alertness: Awake/alert Behavior During Therapy: WFL for tasks assessed/performed Overall Cognitive Status: Within Functional Limits for tasks assessed        General Comments: appropriate, pleasant             Pertinent Vitals/Pain Pain Assessment: No/denies pain Pain Score: 0-No pain     PT Goals (current goals can now be found in the care plan section) Acute Rehab PT Goals Patient Stated Goal: go home and heal up Progress towards PT goals: Progressing toward goals    Frequency    7X/week      PT Plan Current plan remains appropriate    Co-evaluation     PT goals addressed during session: Mobility/safety with mobility;Balance;Strengthening/ROM;Proper use of DME        AM-PAC PT "6 Clicks" Mobility   Outcome Measure  Help needed turning from your back to your side while in a flat bed without using bedrails?: None Help needed moving from lying on your back to  sitting on the side of a flat bed without using bedrails?: None Help needed moving to and from a bed to a chair (including a wheelchair)?: A Little Help needed standing up from a chair using your arms (e.g., wheelchair or bedside chair)?: A Little Help needed to walk in hospital room?: A Little Help needed  climbing 3-5 steps with a railing? : A Little 6 Click Score: 20    End of Session Equipment Utilized During Treatment: Gait belt Activity Tolerance: Patient tolerated treatment well;Patient limited by fatigue Patient left: with call bell/phone within reach;in bed;Other (comment) (OT entered room as PTA left) Nurse Communication: Mobility status PT Visit Diagnosis: Unsteadiness on feet (R26.81);Other abnormalities of gait and mobility (R26.89);Muscle weakness (generalized) (M62.81);Difficulty in walking, not elsewhere classified (R26.2)     Time: [id number] PT Time Calculation (min) (ACUTE ONLY): 10 min  Charges:  $Gait Training: 8-22 mins                     Julaine Fusi PTA 05/18/21, 2:22 PM

## 2021-05-18 NOTE — Progress Notes (Signed)
PROGRESS NOTE    [name]  [id number] DOB: 10-Nov-1965 DOA: 05/09/2021 PCP: Pcp, No    Brief Narrative:  55 y.o. Caucasian male with medical history significant for type 2 diabetes mellitus, hypertension and dyslipidemia, who presented to the emergency room with acute onset of infected right foot wound.  Due to previous history of right foot wound and osteomyelitis he had a great toe and fifth toe amputated.  Swelling has been extending to his whole foot.  His wound was initially over the plantar aspect of his distal foot and later he developed a wound over the lateral aspect.  He now notices foul-smelling drainage and increasing pain that is becoming throbbing in graded 10/10 in severity, worsening with weightbearing.  The redness and pain has been significantly worsening despite wound care that was being provided by his friend who is a retired [employer] scientist.   Patient seen in consultation by podiatry.  MRI pursued with significant osteomyelitis and tissue loss.  Status post transmetatarsal amputation right foot.  Doing well.  Pain well controlled.  ID consulted for antibiotic recommendations.  8/17- went to OR yesterday for I&D of right foot.  No overnight issues 8/19- no overnight issues 8/20-no overnight issues.  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 Flagyl 500 mg 3 times daily x4 weeks, end date 06/13/2021. PICC was placed 8/19 podiatry following.  Dressing changed today.  No purulence noted. Podiatry recommended  nonweightbearing to the foot.  Only weightbearing to the heel as needed for transfer.  Dressing changes 3 times a week.  Cleanse wound with saline.  Paint areas with Betadine as needed for maceration.  Cover with bulky sterile bandage. I/20 follow-up with podiatry in 2 weeks  Continue IV antibiotics  Home health  Awaiting home health set up    HTN-continue amlodipine if needed will increase dose.   Hypercalcemia Unclear etiology, possibly from bone breakdown Improved after hydration normal saline and 1 dose Lasix 8/20 last calcium level had improved.   Continue to monitor periodically      Type 2 diabetes mellitus Home sulfonylurea has been held 8/20 BG labile overall stable  Continue ISS      Hyperlipidemia Continue statins   DVT prophylaxis: SCDs Code Status: Full Family Communication: None today Disposition Plan: Status is: Inpatient  Remains inpatient appropriate because:Inpatient level of care appropriate due to severity of illness  Dispo: The patient is from: Home              Anticipated d/c is to: Home with home health              Patient currently is not medically stable to d/c.   Difficult to place patient No Continue IV antibiotics.  Needs home health set up prior to discharge as patient is getting infusion of antibiotics for 4 weeks.  Case management working on this     Level of care: Med-Surg  Consultants:  Podiatry,  ID  Procedures:  Transmetatarsal amputation 8/12  Antimicrobials:   IV vancomycin Zosyn-dc'd 8/16 unasyn   Subjective: No pain, chest pain, fever or chills.  Overall doing well  Objective: Vitals:   05/17/21 1944 05/17/21 2323 05/18/21 0354 05/18/21 0803  BP: (!) 148/90 135/87 133/82 (!) 144/94  Pulse: 73 69 68 66  Resp: _0 Temp: 98.4 F (36.9 C) 98.1 F (36.7 C) 98.6 F (37 C) 98 F (36.7 C)  TempSrc:   Oral   SpO2: 100% 99% 99% 100%  Weight:      Height:        Intake/Output Summary (Last 24  hours) at 05/18/2021 0817 Last data filed at 05/18/2021 0605 Gross per 24 hour  Intake 580 ml  Output 1975 ml  Net -1395 ml   Filed Weights   05/09/21 1416 05/10/21 0008  Weight: 97.5 kg 94.3 kg    Examination: Calm, NAD CTA no wheeze rales rhonchi's Regular S1-S2 no gallops Soft benign positive bowel sounds No edema, right lower extremity wrapped in dressing  Data Reviewed: I have personally reviewed following labs and imaging studies  CBC: No results for input(s): WBC, NEUTROABS, HGB, HCT, MCV, PLT in the last 168 hours.  Basic Metabolic Panel: Recent Labs  Lab 05/13/21 0148 05/14/21 0159  CREATININE 0.73 0.73   GFR: Estimated Creatinine Clearance: 124.7 mL/min (by C-G formula based on SCr of 0.73 mg/dL). Liver Function Tests: No results for input(s): AST, ALT, ALKPHOS, BILITOT, PROT, ALBUMIN in the last 168 hours.  No results for input(s): LIPASE, AMYLASE in the last 168 hours. No results for input(s): AMMONIA in the last 168 hours. Coagulation Profile: No results for input(s): INR, PROTIME in the last 168 hours.  Cardiac Enzymes: No results for input(s): CKTOTAL, CKMB, CKMBINDEX, TROPONINI in the last 168 hours. BNP (last 3 results) No results for input(s): PROBNP in the last 8760 hours. HbA1C: No results for input(s): HGBA1C in the last 72 hours.  CBG: Recent Labs  Lab 05/17/21 0730 05/17/21 1218 05/17/21 1628 05/17/21 2046 05/18/21 0730  GLUCAP 185* 236* 244* 151* 219*   Lipid Profile: No results for input(s): CHOL, HDL, LDLCALC, TRIG, CHOLHDL, LDLDIRECT in the last 72 hours. Thyroid Function Tests: No results for input(s): TSH, T4TOTAL, FREET4, T3FREE, THYROIDAB in the last 72 hours. Anemia Panel: No results for input(s): VITAMINB12, FOLATE, FERRITIN, TIBC, IRON, RETICCTPCT in the last 72 hours. Sepsis Labs: No results for input(s): PROCALCITON, LATICACIDVEN in the last 168 hours.   Recent Results (from the past 240 hour(s))  Culture, blood  (Routine x 2)     Status: None   Collection Time: 05/09/21  2:25 PM   Specimen: BLOOD  Result Value Ref Range Status   Specimen Description BLOOD BLOOD RIGHT FOREARM  Final   Special Requests   Final    BOTTLES DRAWN AEROBIC AND ANAEROBIC Blood Culture adequate volume   Culture   Final    NO GROWTH 5 DAYS Performed at [facility], [name], [place], [place] [address]    Report Status 05/14/2021 FINAL  Final  SARS CORONAVIRUS 2 (TAT 6-24 HRS) Nasopharyngeal Nasopharyngeal Swab     Status: None   Collection Time: 05/09/21 10:05 PM   Specimen: Nasopharyngeal Swab  Result Value Ref Range Status   SARS Coronavirus 2 NEGATIVE NEGATIVE Final    Comment: (NOTE) SARS-CoV-2 target nucleic acids are NOT DETECTED.  The SARS-CoV-2 RNA is generally detectable in upper and lower respiratory specimens during the  acute phase of infection. Negative results do not preclude SARS-CoV-2 infection, do not rule out co-infections with other pathogens, and should not be used as the sole basis for treatment or other patient management decisions. Negative results must be combined with clinical observations, patient history, and epidemiological information. The expected result is Negative.  Fact Sheet for Patients: SugarRoll.be  Fact Sheet for Healthcare Providers: https://www.woods-mathews.com/  This test is not yet approved or cleared by the [place] FDA and  has been authorized for detection and/or diagnosis of SARS-CoV-2 by FDA under an Emergency Use Authorization (EUA). This EUA will remain  in effect (meaning this test can be used) for the duration of the COVID-19 declaration under Se ction 564(b)(1) of the Act, 21 U.S.C. section 360bbb-3(b)(1), unless the authorization is terminated or revoked sooner.  Performed at [facility], [place] [address] [place], [place] [address]   Surgical PCR screen     Status: None   Collection  Time: 05/10/21  2:29 AM   Specimen: Nasal Mucosa; Nasal Swab  Result Value Ref Range Status   MRSA, PCR NEGATIVE NEGATIVE Final   Staphylococcus aureus NEGATIVE NEGATIVE Final    Comment: (NOTE) The Xpert SA Assay (FDA approved for NASAL specimens in patients 12 years of age and older), is one component of a comprehensive surveillance program. It is not intended to diagnose infection nor to guide or monitor treatment. Performed at [facility], [address] [place], [place] [address]   Culture, blood (Routine X 2) w Reflex to ID Panel     Status: None   Collection Time: 05/10/21  5:00 AM   Specimen: BLOOD  Result Value Ref Range Status   Specimen Description BLOOD BLOOD LEFT HAND  Final   Special Requests   Final    BOTTLES DRAWN AEROBIC AND ANAEROBIC Blood Culture adequate volume   Culture   Final    NO GROWTH 5 DAYS Performed at [facility], [address] [place], [place] [address]    Report Status 05/15/2021 FINAL  Final  Aerobic/Anaerobic Culture w Gram Stain (surgical/deep wound)     Status: None   Collection Time: 05/10/21 11:13 AM   Specimen: PATH Other; Tissue  Result Value Ref Range Status   Specimen Description   Final    WOUND Performed at [facility], [address], [place], [place] [address]    Special Requests   Final    RIGHT TOE Performed at [facility], [address] [place], [place] [address]    Gram Stain   Final    NO ORGANISMS SEEN MODERATE GRAM POSITIVE COCCI FEW GRAM NEGATIVE RODS    Culture   Final    RARE PROTEUS MIRABILIS WITH IN MIXED ORGANISMS ABUNDANT BACTEROIDES SPECIES BETA LACTAMASE NEGATIVE Performed at [facility], [place] [address] [place], [place] [address]    Report Status 05/15/2021 FINAL  Final   Organism ID, Bacteria PROTEUS MIRABILIS  Final      Susceptibility   Proteus mirabilis - MIC*    AMPICILLIN <=2 SENSITIVE Sensitive     CEFAZOLIN 8 SENSITIVE Sensitive      CEFEPIME <=0.12 SENSITIVE Sensitive     CEFTAZIDIME <=1 SENSITIVE Sensitive     CEFTRIAXONE <=0.25 SENSITIVE Sensitive     CIPROFLOXACIN <=0.25 SENSITIVE Sensitive     GENTAMICIN <=1 SENSITIVE Sensitive     IMIPENEM 2 SENSITIVE Sensitive     TRIMETH/SULFA <=20 SENSITIVE Sensitive     AMPICILLIN/SULBACTAM <=2  SENSITIVE Sensitive     PIP/TAZO <=4 SENSITIVE Sensitive     * RARE PROTEUS MIRABILIS         Radiology Studies: No results found.      Scheduled Meds:  atorvastatin  40 mg Oral QHS   Chlorhexidine Gluconate Cloth  6 each Topical Daily   insulin aspart  0-15 Units Subcutaneous TID WC   metroNIDAZOLE  500 mg Oral Q8H   sodium chloride flush  10-40 mL Intracatheter Q12H   Continuous Infusions:  sodium chloride Stopped (05/13/21 1434)   cefTRIAXone (ROCEPHIN)  IV Stopped (05/17/21 0853)     LOS: 9 days    Time spent: 35 minutes with more than 50% on [place], MD [facility] Pager [phone]  If 7PM-7AM, please contact night-coverage 05/18/2021, 8:17 AM

## 2021-05-18 NOTE — TOC Progression Note (Signed)
Transition of Care [facility]) - Progression Note    Patient Details  Name: Jometh Manaois MRN: [id number] Date of Birth: 10-10-1965  Transition of Care [facility]) CM/SW [place], RN Phone Number: 05/18/2021, 4:11 PM  Clinical Narrative:   RNCM notified Corene Cornea at [facility]: nurse/home health for patient.  Corene Cornea is looking into this, TOC will reach out to [place] tomorrow for update.    Expected Discharge Plan: [place] Barriers to Discharge: Inadequate or no insurance, Continued Medical Work up, Other (must enter comment) (No PCP)  Expected Discharge Plan and Services Expected Discharge Plan: [place] Choice: [place] arrangements for the past 2 months: Apartment                                       Social Determinants of Health (SDOH) Interventions    Readmission Risk Interventions No flowsheet data found.

## 2021-05-19 DIAGNOSIS — M869 Osteomyelitis, unspecified: Secondary | ICD-10-CM

## 2021-05-19 LAB — GLUCOSE, CAPILLARY
Glucose-Capillary: 143 mg/dL — ABNORMAL HIGH (ref 70–99)
Glucose-Capillary: 205 mg/dL — ABNORMAL HIGH (ref 70–99)
Glucose-Capillary: 204 mg/dL — ABNORMAL HIGH (ref 70–99)
Glucose-Capillary: 108 mg/dL — ABNORMAL HIGH (ref 70–99)

## 2021-05-19 LAB — BASIC METABOLIC PANEL
Anion gap: 4 — ABNORMAL LOW (ref 5–15)
BUN: 11 mg/dL (ref 6–20)
CO2: 27 mmol/L (ref 22–32)
Calcium: 13.2 mg/dL (ref 8.9–10.3)
Chloride: 106 mmol/L (ref 98–111)
Creatinine, Ser: 0.58 mg/dL — ABNORMAL LOW (ref 0.61–1.24)
GFR, Estimated: >60 mL/min (ref >60)
Glucose, Bld: 164 mg/dL — ABNORMAL HIGH (ref 70–99)
Potassium: 3.5 mmol/L (ref 3.5–5.1)
Sodium: 137 mmol/L (ref 135–145)

## 2021-05-19 MED ORDER — LACTATED RINGERS IV SOLN: INTRAVENOUS | Status: DC

## 2021-05-19 MED ORDER — INSULIN GLARGINE-YFGN 100 UNIT/ML ~~LOC~~ SOLN
5.0000 [IU] | Freq: Every day | SUBCUTANEOUS | Status: DC
  Administered 2021-05-19 – 2021-05-22 (×4): 5 [IU] via SUBCUTANEOUS
  Filled 2021-05-19 (×4): qty 0.05

## 2021-05-19 NOTE — Progress Notes (Signed)
Calcium 13.2 per lab, MD H. Duncan notified. Will continue to monitor to ensure comfort and safety.

## 2021-05-19 NOTE — Progress Notes (Signed)
PROGRESS NOTE    [name]  PNT:[id number] DOB: 04-06-1966 DOA: 05/09/2021 PCP: Pcp, No    Brief Narrative:  55 y.o. Caucasian male with medical history significant for type 2 diabetes mellitus, hypertension and dyslipidemia, who presented to the emergency room with acute onset of infected right foot wound.  Due to previous history of right foot wound and osteomyelitis he had a great toe and fifth toe amputated.  Swelling has been extending to his whole foot.  His wound was initially over the plantar aspect of his distal foot and later he developed a wound over the lateral aspect.  He now notices foul-smelling drainage and increasing pain that is becoming throbbing in graded 10/10 in severity, worsening with weightbearing.  The redness and pain has been significantly worsening despite wound care that was being provided by his friend who is a retired [employer] scientist.   Patient seen in consultation by podiatry.  MRI pursued with significant osteomyelitis and tissue loss.  Status post transmetatarsal amputation right foot.  Doing well.  Pain well controlled.  ID consulted for antibiotic recommendations.  8/17- went to OR yesterday for I&D of right foot.  No overnight issues 8/19- no overnight issues 8/20-no overnight issues.  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 Flagyl 500 mg 3 times daily x4 weeks, end date 06/13/2021. PICC was placed 8/19 podiatry following.  Dressing changed today.  No purulence noted. Podiatry recommended  nonweightbearing to the foot.  Only weightbearing to the heel as needed for transfer.  Dressing changes 3 times a week.  Cleanse wound with saline.  Paint areas with Betadine as needed for maceration.  Cover with bulky sterile bandage. 8/21-f/u 2 weeks with podiatry  Continue iv antibiotics Awaiting HH setup-charity    HTN-continue amlodipine    Hypercalcemia Unclear etiology, possibly from bone breakdown Improved after hydration normal saline and 1 dose Lasix 8/21 calcium level had improved but now up to 13. Will check ionized calcium Restart on LR for hydration      Type 2 diabetes mellitus Home sulfonylurea has been held 8/21 BG labile. Will give lantus 5 units dailly Continue RISS    Hyperlipidemia Continue statins   DVT prophylaxis: SCDs Code Status: Full Family Communication: None today Disposition Plan: Status is: Inpatient  Remains inpatient appropriate because:Inpatient level of care appropriate due to severity of illness  Dispo: The patient is from: Home              Anticipated d/c is to: Home with home health              Patient currently is not medically stable to d/c.   Difficult to place patient No Continue IV antibiotics.  Needs home health set up prior to discharge as patient is getting infusion of antibiotics for 4 weeks.  Case management working on this     Level of care: Med-Surg  Consultants:  Podiatry, ID  Procedures:  Transmetatarsal amputation 8/12  Antimicrobials:   IV vancomycin Zosyn-dc'd 8/16 unasyn   Subjective: No chest pain, shortness of breath, dizziness or headaches  Objective: Vitals:   05/18/21 1534 05/18/21 1952 05/19/21 0454 05/19/21 0812  BP: (!) 133/91 133/87 115/85 130/84  Pulse: 74 70 69 71  Resp: _0 Temp: 97.6 F (36.4 C) 98.3 F (36.8 C) 98.1 F (36.7 C) 97.8 F (36.6 C)  TempSrc:      SpO2: 100% 100% 98% 100%  Weight:      Height:        Intake/Output Summary (Last 24 hours) at  05/19/2021 0837 Last data filed at 05/19/2021 0500 Gross per 24 hour  Intake 94.75 ml  Output 2200 ml  Net -2105.25 ml   Filed Weights   05/09/21 1416 05/10/21 0008  Weight: 97.5 kg 94.3 kg    Examination: NAD, calm CTA no wheeze rales Regular S1-S2 no gallops Soft benign positive bowel sounds aaxox4  Data Reviewed: I have personally reviewed following labs and imaging studies  CBC: No results for input(s): WBC, NEUTROABS, HGB, HCT, MCV, PLT in the last 168 hours.  Basic Metabolic Panel: Recent Labs  Lab 05/13/21 0148 05/14/21 0159 05/19/21 0416  NA  --   --  137  K  --   --  3.5  CL  --   --  106  CO2  --   --  27  GLUCOSE  --   --  164*  BUN  --   --  11  CREATININE 0.73 0.73 0.58*  CALCIUM  --   --  13.2*   GFR: Estimated Creatinine Clearance: 124.7 mL/min (A) (by C-G formula based on SCr of 0.58 mg/dL (L)). Liver Function Tests: No results for input(s): AST, ALT, ALKPHOS, BILITOT, PROT, ALBUMIN in the last 168 hours.  No results for input(s): LIPASE, AMYLASE in the last 168 hours. No results for input(s): AMMONIA in the last 168 hours. Coagulation Profile: No results for input(s): INR, PROTIME in the last 168 hours.  Cardiac Enzymes: No results for input(s): CKTOTAL, CKMB, CKMBINDEX, TROPONINI in the last 168 hours. BNP (last 3 results) No results for input(s): PROBNP in the last 8760 hours. HbA1C: No results for input(s): HGBA1C in the last 72 hours.  CBG: Recent Labs  Lab 05/18/21 0730 05/18/21 1242 05/18/21 1633 05/18/21 2121 05/19/21 0814  GLUCAP 219* 202* 184* 227* 143*   Lipid Profile: No results for input(s): CHOL, HDL, LDLCALC, TRIG, CHOLHDL, LDLDIRECT in the last 72 hours. Thyroid Function Tests: No results for input(s): TSH, T4TOTAL, FREET4, T3FREE, THYROIDAB in the last 72 hours. Anemia Panel: No results for input(s): VITAMINB12, FOLATE, FERRITIN, TIBC, IRON, RETICCTPCT in the last 72 hours. Sepsis Labs: No results for input(s):  PROCALCITON, LATICACIDVEN in the last 168 hours.   Recent Results (from the past 240 hour(s))  Culture, blood (Routine x 2)     Status: None   Collection Time: 05/09/21  2:25 PM   Specimen: BLOOD  Result Value Ref Range Status   Specimen Description BLOOD BLOOD RIGHT FOREARM  Final   Special Requests   Final    BOTTLES DRAWN AEROBIC AND ANAEROBIC Blood Culture adequate volume   Culture   Final    NO GROWTH 5 DAYS Performed at [facility], [address], [place], [place] [address]    Report Status 05/14/2021 FINAL  Final  SARS CORONAVIRUS 2 (TAT 6-24 HRS) Nasopharyngeal Nasopharyngeal Swab     Status: None  Collection Time: 05/09/21 10:05 PM   Specimen: Nasopharyngeal Swab  Result Value Ref Range Status   SARS Coronavirus 2 NEGATIVE NEGATIVE Final    Comment: (NOTE) SARS-CoV-2 target nucleic acids are NOT DETECTED.  The SARS-CoV-2 RNA is generally detectable in upper and lower respiratory specimens during the acute phase of infection. Negative results do not preclude SARS-CoV-2 infection, do not rule out co-infections with other pathogens, and should not be used as the sole basis for treatment or other patient management decisions. Negative results must be combined with clinical observations, patient history, and epidemiological information. The expected result is Negative.  Fact Sheet for Patients: SugarRoll.be  Fact Sheet for Healthcare Providers: https://www.woods-mathews.com/  This test is not yet approved or cleared by the [place] FDA and  has been authorized for detection and/or diagnosis of SARS-CoV-2 by FDA under an Emergency Use Authorization (EUA). This EUA will remain  in effect (meaning this test can be used) for the duration of the COVID-19 declaration under Se ction 564(b)(1) of the Act, 21 U.S.C. section 360bbb-3(b)(1), unless the authorization is terminated or revoked sooner.  Performed at [facility], [place] [address], [place], [place] [address]   Surgical PCR screen     Status: None   Collection Time: 05/10/21  2:29 AM   Specimen: Nasal Mucosa; Nasal Swab  Result Value Ref Range Status   MRSA, PCR NEGATIVE NEGATIVE Final   Staphylococcus aureus NEGATIVE NEGATIVE Final    Comment: (NOTE) The Xpert SA Assay (FDA approved for NASAL specimens in patients 11 years of age and older), is one component of a comprehensive surveillance program. It is not intended to diagnose infection nor to guide or monitor treatment. Performed at [facility], [address] [place], [place] [address]   Culture, blood (Routine X 2) w Reflex to ID Panel     Status: None   Collection Time: 05/10/21  5:00 AM   Specimen: BLOOD  Result Value Ref Range Status   Specimen Description BLOOD BLOOD LEFT HAND  Final   Special Requests   Final    BOTTLES DRAWN AEROBIC AND ANAEROBIC Blood Culture adequate volume   Culture   Final    NO GROWTH 5 DAYS Performed at [facility], [address], [place], [place] [address]    Report Status 05/15/2021 FINAL  Final  Aerobic/Anaerobic Culture w Gram Stain (surgical/deep wound)     Status: None   Collection Time: 05/10/21 11:13 AM   Specimen: PATH Other; Tissue  Result Value Ref Range Status   Specimen Description   Final    WOUND Performed at [facility], [address], [place], [place] [address]    Special Requests   Final    RIGHT TOE Performed at [facility], [address] [place], [place] [address]    Gram Stain   Final    NO ORGANISMS SEEN MODERATE GRAM POSITIVE COCCI FEW GRAM NEGATIVE RODS    Culture   Final    RARE PROTEUS MIRABILIS WITH IN MIXED ORGANISMS ABUNDANT BACTEROIDES SPECIES BETA LACTAMASE NEGATIVE Performed at [facility], [place] [address], [place], [place] [address]    Report Status 05/15/2021 FINAL  Final   Organism ID, Bacteria PROTEUS MIRABILIS  Final      Susceptibility    Proteus mirabilis - MIC*    AMPICILLIN <=2 SENSITIVE Sensitive     CEFAZOLIN 8 SENSITIVE Sensitive     CEFEPIME <=0.12 SENSITIVE Sensitive  CEFTAZIDIME <=1 SENSITIVE Sensitive     CEFTRIAXONE <=0.25 SENSITIVE Sensitive     CIPROFLOXACIN <=0.25 SENSITIVE Sensitive     GENTAMICIN <=1 SENSITIVE Sensitive     IMIPENEM 2 SENSITIVE Sensitive     TRIMETH/SULFA <=20 SENSITIVE Sensitive     AMPICILLIN/SULBACTAM <=2 SENSITIVE Sensitive     PIP/TAZO <=4 SENSITIVE Sensitive     * RARE PROTEUS MIRABILIS         Radiology Studies: No results found.      Scheduled Meds:  atorvastatin  40 mg Oral QHS   Chlorhexidine Gluconate Cloth  6 each Topical Daily   insulin aspart  0-15 Units Subcutaneous TID WC   metroNIDAZOLE  500 mg Oral Q8H   sodium chloride flush  10-40 mL Intracatheter Q12H   Continuous Infusions:  sodium chloride Stopped (05/13/21 1434)   cefTRIAXone (ROCEPHIN)  IV 2 g (05/18/21 0911)     LOS: 10 days    Time spent: 35 minutes with more than 50% on COC    Nolberto Hanlon, MD [facility] Pager [phone]  If 7PM-7AM, please contact night-coverage 05/19/2021, 8:37 AM

## 2021-05-19 NOTE — Progress Notes (Signed)
PT Cancellation Note  Patient Details Name: [name] MRN: [id number] DOB: May 12, 1966   Cancelled Treatment:    Reason Eval/Treat Not Completed: Medical issues which prohibited therapy  Chart reviewed.  Calcium 13.2 this am.  Will hold PT at this time as levels are outside of safe PT parameters per [facility] guidelines.  Will monitor and continue as appropriate at a later time/date.  [name] 05/19/2021, 9:29 AM

## 2021-05-19 NOTE — TOC Progression Note (Signed)
Transition of Care [facility]) - Progression Note    Patient Details  Name: Nyjel Gabino MRN: [id number] Date of Birth: [birth year]  [facility] CM/SW Contact  Izola Price, RN Phone Number: 05/19/2021, 9:28 AM  Clinical Narrative:  Checked in with Corene Cornea per prior CM notes regarding [place] picking up patient for home infusion HH. Other prior notes indicated Dr. Wynelle Cleveland was consulted for PCP needs on discharge with [facility] IV Abx infusions, PT/OT, and any wound care on discharge. Dr. Delaine Lame willing to cover IV ABx orders at discharge per CM. Corene Cornea from [facility] said it would be at least Tuesday or Wednesday before charity reset and this patient can be evaluated for acceptance. Simmie Davies RN CM     Expected Discharge Plan: [place] Barriers to Discharge: Inadequate or no insurance, Continued Medical Work up, Other (must enter comment) (No PCP)  Expected Discharge Plan and Services Expected Discharge Plan: [place] Choice: [place] arrangements for the past 2 months: Apartment                                       Social Determinants of Health (SDOH) Interventions    Readmission Risk Interventions No flowsheet data found.

## 2021-05-20 LAB — GLUCOSE, CAPILLARY
Glucose-Capillary: 149 mg/dL — ABNORMAL HIGH (ref 70–99)
Glucose-Capillary: 149 mg/dL — ABNORMAL HIGH (ref 70–99)
Glucose-Capillary: 201 mg/dL — ABNORMAL HIGH (ref 70–99)
Glucose-Capillary: 155 mg/dL — ABNORMAL HIGH (ref 70–99)
Glucose-Capillary: 177 mg/dL — ABNORMAL HIGH (ref 70–99)

## 2021-05-20 LAB — CALCIUM, IONIZED: Calcium, Ionized, Serum: 7.5 mg/dL — ABNORMAL HIGH (ref 4.5–5.6)

## 2021-05-20 NOTE — Progress Notes (Signed)
Physical Therapy Treatment Patient Details Name: [name] MRN: [id number] DOB: 05/02/1966 Today's Date: 05/20/2021    History of Present Illness 55 y.o. Caucasian male with medical history significant for type 2 diabetes mellitus, hypertension and dyslipidemia, who presented to the emergency room with acute onset of infected right foot wound.  Due to previous history of right foot wound and osteomyelitis he had a great toe and fifth toe amputated.  Swelling has been extending to his whole foot.  His wound was initially over the plantar aspect of his distal foot and later he developed a wound over the lateral aspect.  He now notices foul-smelling drainage and increasing pain that is becoming throbbing in graded 10/10 in severity, worsening with weightbearing.  The redness and pain has been significantly worsening despite wound care that was being provided by his friend who is a retired Marine scientist. 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    PT Comments    No blood draw to re-check calcium at time of session.  OK by Dr. Kurtis Bushman to proceed with session.  OOB without assist.  He is able to stand and hop 80' maintianing NWB with RW.  He does fatigue and is cued to take several standing rest breaks as gait quality decreased with fatigue. Pt self selected gait distance based on prior distance.  Education for awareness and limitations.   Follow Up Recommendations  Home health PT     Equipment Recommendations  Wheelchair (measurements PT);Wheelchair cushion (measurements PT);3in1 (PT)    Recommendations for Other Services       Precautions / Restrictions Precautions Precautions: Fall Restrictions Weight Bearing Restrictions: Yes RLE Weight Bearing: Non weight bearing    Mobility  Bed Mobility Overal bed mobility: Modified Independent                  Transfers Overall transfer level: Needs assistance Equipment used: Rolling walker  (2 wheeled) Transfers: Sit to/from Stand Sit to Stand: Supervision            Ambulation/Gait Ambulation/Gait assistance: Supervision;Min guard Gait Distance (Feet): 80 Feet Assistive device: Rolling walker (2 wheeled)   Gait velocity: Decreased   General Gait Details: Pt was able to ambulate 80 ft with RW while maintianing proper wt bearing throughout   Stairs             Wheelchair Mobility    Modified Rankin (Stroke Patients Only)       Balance Overall balance assessment: Needs assistance Sitting-balance support: Feet supported;No upper extremity supported Sitting balance-Leahy Scale: Good     Standing balance support: Bilateral upper extremity supported;During functional activity Standing balance-Leahy Scale: Good                              Cognition Arousal/Alertness: Awake/alert Behavior During Therapy: WFL for tasks assessed/performed Overall Cognitive Status: Within Functional Limits for tasks assessed                                        Exercises      General Comments        Pertinent Vitals/Pain Pain Assessment: No/denies pain    Home Living                      Prior Function  PT Goals (current goals can now be found in the care plan section) Progress towards PT goals: Progressing toward goals    Frequency    7X/week      PT Plan Current plan remains appropriate    Co-evaluation              AM-PAC PT "6 Clicks" Mobility   Outcome Measure  Help needed turning from your back to your side while in a flat bed without using bedrails?: None Help needed moving from lying on your back to sitting on the side of a flat bed without using bedrails?: None Help needed moving to and from a bed to a chair (including a wheelchair)?: A Little Help needed standing up from a chair using your arms (e.g., wheelchair or bedside chair)?: A Little Help needed to walk in hospital  room?: A Little Help needed climbing 3-5 steps with a railing? : A Little 6 Click Score: 20    End of Session Equipment Utilized During Treatment: Gait belt Activity Tolerance: Patient tolerated treatment well;Patient limited by fatigue Patient left: with call bell/phone within reach;Other (comment);in chair Nurse Communication: Mobility status PT Visit Diagnosis: Unsteadiness on feet (R26.81);Other abnormalities of gait and mobility (R26.89);Muscle weakness (generalized) (M62.81);Difficulty in walking, not elsewhere classified (R26.2)     Time: 0200-0212 PT Time Calculation (min) (ACUTE ONLY): 12 min  Charges:  $Gait Training: 8-22 mins                    [name], PTA 05/20/21, 2:23 PM , 2:21 PM

## 2021-05-20 NOTE — Progress Notes (Signed)
PROGRESS NOTE    [name]  HOZ:[id number] DOB: 14-Oct-1965 DOA: 05/09/2021 PCP: Pcp, No    Brief Narrative:  55 y.o. Caucasian male with medical history significant for type 2 diabetes mellitus, hypertension and dyslipidemia, who presented to the emergency room with acute onset of infected right foot wound.  Due to previous history of right foot wound and osteomyelitis he had a great toe and fifth toe amputated.  Swelling has been extending to his whole foot.  His wound was initially over the plantar aspect of his distal foot and later he developed a wound over the lateral aspect.  He now notices foul-smelling drainage and increasing pain that is becoming throbbing in graded 10/10 in severity, worsening with weightbearing.  The redness and pain has been significantly worsening despite wound care that was being provided by his friend who is a retired Marine scientist.   Patient seen in consultation by podiatry.  MRI pursued with significant osteomyelitis and tissue loss.  Status post transmetatarsal amputation right foot.  Doing well.  Pain well controlled.  ID consulted for antibiotic recommendations.  8/17- went to OR yesterday for I&D of right foot.  No overnight issues 8/19- no overnight issues 8/20-no overnight issues. 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 Flagyl 500 mg 3 times daily x4 weeks, end date 06/13/2021. PICC was placed 8/19 podiatry following.  Dressing changed today.  No purulence noted. Podiatry  recommended nonweightbearing to the foot.  Only weightbearing to the heel as needed for transfer.  Dressing changes 3 times a week.  Cleanse wound with saline.  Paint areas with Betadine as needed for maceration.  Cover with bulky sterile bandage. 8/22 follow-up 2 weeks with podiatry Continue IV antibiotics Follow-up with ID Awaiting HH set up [facility]   HTN-continue amlodipine   Hypercalcemia Unclear etiology, possibly from bone breakdown Improved after hydration normal saline and 1 dose Lasix 8/21 calcium level had improved but now up to 13. 8/22 labs pending continue IV fluids      Type 2 diabetes mellitus Home sulfonylurea has been held BG stable Continue Lantus and R-ISS     Hyperlipidemia Continue statins   DVT prophylaxis: SCDs Code Status: Full Family Communication: None today Disposition Plan: Status is: Inpatient  Remains inpatient appropriate because:Inpatient level of care appropriate due to severity of illness  Dispo: The patient is from: Home              Anticipated d/c is to: Home with home health              Patient currently is not medically stable to d/c.   Difficult to place patient No Continue IV antibiotics.  Needs home health set up prior to discharge as patient is getting infusion of antibiotics for 4 weeks.  Case management working on this     Level of care: Med-Surg  Consultants:  Podiatry, ID  Procedures:  Transmetatarsal amputation 8/12  Antimicrobials:   IV vancomycin Zosyn-dc'd 8/16 unasyn   Subjective: Has no headache, chest pain, shortness of breath or pain in the leg  Objective: Vitals:   05/19/21 1100 05/19/21 1544 05/19/21 1941 05/20/21 0514  BP: 127/75 125/71 (!) 147/93 138/90  Pulse: 68 70 71 64  Resp: _0 Temp: 97.9 F (36.6 C) 97.7 F (36.5 C) 97.7 F (36.5 C) 97.7 F (36.5 C)  TempSrc: Oral     SpO2: 100% 100% 100% 100%  Weight:      Height:        Intake/Output Summary (Last 24 hours)  at 05/20/2021 0828 Last data filed at 05/20/2021 0535 Gross per 24 hour  Intake 2089.85 ml  Output 2700 ml  Net -610.15 ml   Filed Weights   05/09/21 1416 05/10/21 0008  Weight: 97.5 kg 94.3 kg    Examination: NAD, calm CTA no wheeze rales rhonchi's  regular S1-S2 no gallops Soft benign positive bowel sounds No edema   Data Reviewed: I have personally reviewed following labs and imaging studies  CBC: No results for input(s): WBC, NEUTROABS, HGB, HCT, MCV, PLT in the last 168 hours.  Basic Metabolic Panel: Recent Labs  Lab 05/14/21 0159 05/19/21 0416  NA  --  137  K  --  3.5  CL  --  106  CO2  --  27  GLUCOSE  --  164*  BUN  --  11  CREATININE 0.73 0.58*  CALCIUM  --  13.2*   GFR: Estimated Creatinine Clearance: 124.7 mL/min (A) (by C-G formula based on SCr of 0.58 mg/dL (L)). Liver Function Tests: No results for input(s): AST, ALT, ALKPHOS, BILITOT, PROT, ALBUMIN in the last 168 hours.  No results for input(s): LIPASE, AMYLASE in the last 168 hours. No results for input(s): AMMONIA in the last 168 hours. Coagulation Profile: No results for input(s): INR, PROTIME in the last 168 hours.  Cardiac Enzymes: No results for input(s): CKTOTAL, CKMB, CKMBINDEX, TROPONINI in the last 168 hours. BNP (last 3 results) No results for input(s): PROBNP in the last 8760 hours. HbA1C: No results for input(s): HGBA1C in the last 72 hours.  CBG: Recent Labs  Lab 05/18/21 2121 05/19/21 0814 05/19/21 1139 05/19/21 1642 05/19/21 2159  GLUCAP 227* 143* 205* 204* 108*   Lipid Profile: No results for input(s): CHOL, HDL, LDLCALC, TRIG, CHOLHDL, LDLDIRECT in the last 72 hours. Thyroid Function Tests: No results for input(s): TSH, T4TOTAL, FREET4, T3FREE, THYROIDAB in the last 72 hours. Anemia Panel: No results for input(s): VITAMINB12, FOLATE, FERRITIN, TIBC, IRON, RETICCTPCT in the last 72 hours. Sepsis Labs: No results for input(s): PROCALCITON, LATICACIDVEN in the last  168 hours.   Recent Results (from the past 240 hour(s))  Aerobic/Anaerobic Culture w Gram Stain (surgical/deep wound)     Status: None   Collection Time: 05/10/21 11:13 AM   Specimen: PATH Other; Tissue  Result Value Ref Range Status   Specimen Description   Final    WOUND Performed at [facility], [address], [place],  [address]    Special Requests   Final    RIGHT TOE Performed at [facility], [address] [place], [place] [address]    Gram Stain   Final    NO ORGANISMS SEEN MODERATE GRAM POSITIVE COCCI FEW GRAM NEGATIVE RODS    Culture   Final    RARE PROTEUS MIRABILIS WITH IN MIXED ORGANISMS ABUNDANT BACTEROIDES SPECIES BETA LACTAMASE  NEGATIVE Performed at [facility], [place] [address], [place], [place] [address]    Report Status 05/15/2021 FINAL  Final   Organism ID, Bacteria PROTEUS MIRABILIS  Final      Susceptibility   Proteus mirabilis - MIC*    AMPICILLIN <=2 SENSITIVE Sensitive     CEFAZOLIN 8 SENSITIVE Sensitive     CEFEPIME <=0.12 SENSITIVE Sensitive     CEFTAZIDIME <=1 SENSITIVE Sensitive     CEFTRIAXONE <=0.25 SENSITIVE Sensitive     CIPROFLOXACIN <=0.25 SENSITIVE Sensitive     GENTAMICIN <=1 SENSITIVE Sensitive     IMIPENEM 2 SENSITIVE Sensitive     TRIMETH/SULFA <=20 SENSITIVE Sensitive     AMPICILLIN/SULBACTAM <=2 SENSITIVE Sensitive     PIP/TAZO <=4 SENSITIVE Sensitive     * RARE PROTEUS MIRABILIS         Radiology Studies: No results found.      Scheduled Meds:  atorvastatin  40 mg Oral QHS   Chlorhexidine Gluconate Cloth  6 each Topical Daily   insulin aspart  0-15 Units Subcutaneous TID WC   insulin glargine-yfgn  5 Units Subcutaneous Daily   metroNIDAZOLE  500 mg Oral Q8H   sodium chloride flush  10-40 mL Intracatheter Q12H   Continuous Infusions:  sodium chloride Stopped (05/13/21 1434)   cefTRIAXone (ROCEPHIN)  IV Stopped (05/19/21 1007)   lactated ringers 125 mL/hr at 05/20/21 0446      LOS: 11 days    Time spent: 35 minutes with more than 50% on COC    Nolberto Hanlon, MD [facility] Pager [phone]  If 7PM-7AM, please contact night-coverage 05/20/2021, 8:28 AM

## 2021-05-20 NOTE — TOC Progression Note (Signed)
Transition of Care [facility]) - Progression Note    Patient Details  Name: [name] MRN: [id number] Date of Birth: 1965-11-13  Transition of Care [facility]) CM/SW [place], RN Phone Number: 05/20/2021, 3:54 PM  Clinical Narrative:   Continuing to await response about Home Health.  Carolynn Sayers from [facility] aware of plan, care team aware.    Expected Discharge Plan: [place] Barriers to Discharge: Inadequate or no insurance, Continued Medical Work up, Other (must enter comment) (No PCP)  Expected Discharge Plan and Services Expected Discharge Plan: [place] Choice: [place] arrangements for the past 2 months: Apartment                                       Social Determinants of Health (SDOH) Interventions    Readmission Risk Interventions No flowsheet data found.

## 2021-05-20 NOTE — Progress Notes (Signed)
Occupational Therapy Treatment Patient Details Name: [name] MRN: [id number] DOB: 02-Jan-1966 Today's Date: 05/20/2021    History of present illness 55 y.o. Caucasian male with medical history significant for type 2 diabetes mellitus, hypertension and dyslipidemia, who presented to the emergency room with acute onset of infected right foot wound.  Due to previous history of right foot wound and osteomyelitis he had a great toe and fifth toe amputated.  Swelling has been extending to his whole foot.  His wound was initially over the plantar aspect of his distal foot and later he developed a wound over the lateral aspect.  He now notices foul-smelling drainage and increasing pain that is becoming throbbing in graded 10/10 in severity, worsening with weightbearing.  The redness and pain has been significantly worsening despite wound care that was being provided by his friend who is a retired Marine scientist. 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   OT comments  [name] demonstrated good continued progress towards goals. Today he worked on [employer] dressing, including donning/doffing pants and underclothing, figuring out which parts of tasks he could perform in standing, which he needed to do in sitting, as well as determining when in the LB-dressing cycle he needed to don and doff post-op boot. He displayed good awareness of WB status and when it was appropriate for him to put limited weight through heel. He performed UE therex in sitting, demonstrating good understanding of how to use therapeutic bands. Pt left in recliner, chair alarm activated, and call bell within reach. Changing OT frequency from 2x/wk to 1x/wk, given pt's progress towards goals and increasing ability to perform fxl mobility tasks INDly.    Follow Up Recommendations  Home health OT    Equipment Recommendations  3 in 1 bedside commode;Tub/shower bench;Toilet rise with handles;Wheelchair  cushion (measurements OT);Wheelchair (measurements OT)    Recommendations for Other Services      Precautions / Restrictions Precautions Precautions: Fall Precaution Comments: Mod fall risk Restrictions Weight Bearing Restrictions: Yes RLE Weight Bearing: Non weight bearing Other Position/Activity Restrictions: Pt okayed for minimal weight bearing thru heel with transfers and balance       Mobility Bed Mobility Overal bed mobility: Modified Independent                  Transfers Overall transfer level: Needs assistance Equipment used: Rolling walker (2 wheeled) Transfers: Sit to/from Stand Sit to Stand: Supervision              Balance Overall balance assessment: Needs assistance Sitting-balance support: Feet supported;No upper extremity supported Sitting balance-Leahy Scale: Good Sitting balance - Comments: maintains good sitting balance while engaging in LB dressing   Standing balance support: Bilateral upper extremity supported;During functional activity Standing balance-Leahy Scale: Good Standing balance comment: occassional minimal unsteadiness in standing during fxl mobility, pt able to self-correct                           ADL either performed or assessed with clinical judgement   ADL Overall ADL's : Needs assistance/impaired                 Upper Body Dressing : Independent   Lower Body Dressing: Supervision/safety;Set up;Independent Lower Body Dressing Details (indicate cue type and reason): IND for donning, doffing post-op shoe, SUPV for donning/doffing underwear, pants  Vision Patient Visual Report: No change from baseline     Perception     Praxis      Cognition Arousal/Alertness: Awake/alert Behavior During Therapy: WFL for tasks assessed/performed Overall Cognitive Status: Within Functional Limits for tasks assessed                                 General Comments:  appropriate, pleasant        Exercises Other Exercises Other Exercises: Performed UB therex with therabands in sitting   Shoulder Instructions       General Comments      Pertinent Vitals/ Pain       Pain Assessment: No/denies pain  Home Living                                          Prior Functioning/Environment              Frequency  Min 1X/week        Progress Toward Goals  OT Goals(current goals can now be found in the care plan section)  Progress towards OT goals: Progressing toward goals  Acute Rehab OT Goals Patient Stated Goal: Return home OT Goal Formulation: With patient Time For Goal Achievement: 05/25/21 Potential to Achieve Goals: Good  Plan Discharge plan remains appropriate;Frequency needs to be updated    Co-evaluation                 AM-PAC OT "6 Clicks" Daily Activity     Outcome Measure   Help from another person eating meals?: None Help from another person taking care of personal grooming?: None Help from another person toileting, which includes using toliet, bedpan, or urinal?: A Little Help from another person bathing (including washing, rinsing, drying)?: A Little Help from another person to put on and taking off regular upper body clothing?: None Help from another person to put on and taking off regular lower body clothing?: A Little 6 Click Score: 21    End of Session Equipment Utilized During Treatment: Rolling walker  OT Visit Diagnosis: Unsteadiness on feet (R26.81);Other abnormalities of gait and mobility (R26.89);Muscle weakness (generalized) (M62.81);Pain   Activity Tolerance Patient tolerated treatment well   Patient Left with call bell/phone within reach;in chair;with chair alarm set   Nurse Communication          Time: [id number] OT Time Calculation (min): 27 min  Charges: OT General Charges $OT Visit: 1 Visit OT Treatments $Self Care/Home Management : 23-37 mins  Josiah Lobo, PhD, MS, OTR/L 05/20/21, 4:10 PM

## 2021-05-21 LAB — BASIC METABOLIC PANEL
Anion gap: 3 — ABNORMAL LOW (ref 5–15)
BUN: 10 mg/dL (ref 6–20)
CO2: 31 mmol/L (ref 22–32)
Calcium: 13.1 mg/dL (ref 8.9–10.3)
Chloride: 103 mmol/L (ref 98–111)
Creatinine, Ser: 0.68 mg/dL (ref 0.61–1.24)
GFR, Estimated: >60 mL/min (ref >60)
Glucose, Bld: 161 mg/dL — ABNORMAL HIGH (ref 70–99)
Potassium: 3.6 mmol/L (ref 3.5–5.1)
Sodium: 137 mmol/L (ref 135–145)

## 2021-05-21 LAB — CBC
HCT: 42.1 % (ref 39.0–52.0)
Hemoglobin: 14.7 g/dL (ref 13.0–17.0)
MCH: 32.8 pg (ref 26.0–34.0)
MCHC: 34.9 g/dL (ref 30.0–36.0)
MCV: 94 fL (ref 80.0–100.0)
Platelets: 199 10*3/uL (ref 150–400)
RBC: 4.48 MIL/uL (ref 4.22–5.81)
RDW: 11.7 % (ref 11.5–15.5)
WBC: 5.5 10*3/uL (ref 4.0–10.5)
nRBC: 0 % (ref 0.0–0.2)

## 2021-05-21 LAB — GLUCOSE, CAPILLARY
Glucose-Capillary: 197 mg/dL — ABNORMAL HIGH (ref 70–99)
Glucose-Capillary: 244 mg/dL — ABNORMAL HIGH (ref 70–99)
Glucose-Capillary: 239 mg/dL — ABNORMAL HIGH (ref 70–99)
Glucose-Capillary: 175 mg/dL — ABNORMAL HIGH (ref 70–99)

## 2021-05-21 LAB — CALCIUM, IONIZED: Calcium, Ionized, Serum: 7.9 mg/dL — ABNORMAL HIGH (ref 4.5–5.6)

## 2021-05-21 MED ORDER — SODIUM CHLORIDE 0.9 % IV SOLN: INTRAVENOUS | Status: DC

## 2021-05-21 MED ORDER — FUROSEMIDE 10 MG/ML IJ SOLN
20.0000 mg | Freq: Once | INTRAMUSCULAR | Status: AC
  Administered 2021-05-21: 20 mg via INTRAVENOUS
  Filled 2021-05-21: qty 4

## 2021-05-21 MED ORDER — ENOXAPARIN SODIUM 40 MG/0.4ML IJ SOSY
40.0000 mg | PREFILLED_SYRINGE | INTRAMUSCULAR | Status: DC
  Administered 2021-05-21: 40 mg via SUBCUTANEOUS
  Filled 2021-05-21: qty 0.4

## 2021-05-21 NOTE — Progress Notes (Signed)
Physical Therapy Treatment Patient Details Name: [name] MRN: [id number] DOB: 06-16-1966 Today's Date: 05/21/2021    History of Present Illness 55 y.o. Caucasian male with medical history significant for type 2 diabetes mellitus, hypertension and dyslipidemia, who presented to the emergency room with acute onset of infected right foot wound.  Due to previous history of right foot wound and osteomyelitis he had a great toe and fifth toe amputated.  Swelling has been extending to his whole foot.  His wound was initially over the plantar aspect of his distal foot and later he developed a wound over the lateral aspect.  He now notices foul-smelling drainage and increasing pain that is becoming throbbing in graded 10/10 in severity, worsening with weightbearing.  The redness and pain has been significantly worsening despite wound care that was being provided by his friend who is a retired Marine scientist. 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    PT Comments    Pt was long sitting in bed upon arriving. He agrees to PT session and is cooperative and motivated throughout. Easily able to exit R side of bed, stand to RW, and ambulate(hop) ~ 100 ft to rehab gym. Throughout session, pt demonstrates great ability to maintain NWB RLE in post op shoe.  Several standing rest breaks due to fatigue however no LOB.  Reviewed and then pt performed, "hopping" up 1 step to simulate home environment( pt has curb step to get into his house). Also does have ramp but would have to go out of his way to get to his front door. He demonstrates safe abilities to perform all desired task. Will continue to follow until DC home. Recommend HHPT to follow post hospitalization to continue to improve activity    Follow Up Recommendations  Home health PT     Equipment Recommendations  Wheelchair (measurements PT);Wheelchair cushion (measurements PT);3in1 (PT);Other (comment) (for  longer distances)       Precautions / Restrictions Precautions Precautions: Fall Restrictions Weight Bearing Restrictions: Yes RLE Weight Bearing: Non weight bearing    Mobility  Bed Mobility Overal bed mobility: Modified Independent     Transfers Overall transfer level: Needs assistance Equipment used: Rolling walker (2 wheeled) Transfers: Sit to/from Stand Sit to Stand: Supervision         General transfer comment: no physical assistance required  Ambulation/Gait Ambulation/Gait assistance: Supervision;Min guard Gait Distance (Feet): 100 Feet Assistive device: Rolling walker (2 wheeled) Gait Pattern/deviations:  (" hop to.") Gait velocity: Decreased   General Gait Details: Pt was able to ambulate 100 ft with RW while maintianing proper wt bearing throughout   Stairs Stairs: Yes Stairs assistance: Min guard Stair Management: No rails;With walker;Backwards;Step to pattern ("hop up") Number of Stairs: 1 General stair comments: pt demonstrated safe abilityt o "hop" up 1 step to simulate curb entry like home environment    Balance Overall balance assessment: Needs assistance Sitting-balance support: Feet supported;No upper extremity supported Sitting balance-Leahy Scale: Good     Standing balance support: Bilateral upper extremity supported;During functional activity Standing balance-Leahy Scale: Good      Cognition Arousal/Alertness: Awake/alert Behavior During Therapy: WFL for tasks assessed/performed Overall Cognitive Status: Within Functional Limits for tasks assessed      General Comments: appropriate, pleasant             Pertinent Vitals/Pain Pain Assessment: No/denies pain Pain Score: 0-No pain     PT Goals (current goals can now be found in the care plan  section) Acute Rehab PT Goals Patient Stated Goal: Return home Progress towards PT goals: Progressing toward goals    Frequency    7X/week      PT Plan Current plan remains  appropriate    Co-evaluation     PT goals addressed during session: Mobility/safety with mobility;Balance;Proper use of DME;Strengthening/ROM        AM-PAC PT "6 Clicks" Mobility   Outcome Measure  Help needed turning from your back to your side while in a flat bed without using bedrails?: None Help needed moving from lying on your back to sitting on the side of a flat bed without using bedrails?: None Help needed moving to and from a bed to a chair (including a wheelchair)?: A Little Help needed standing up from a chair using your arms (e.g., wheelchair or bedside chair)?: A Little Help needed to walk in hospital room?: A Little Help needed climbing 3-5 steps with a railing? : A Little 6 Click Score: 20    End of Session Equipment Utilized During Treatment: Gait belt Activity Tolerance: Patient tolerated treatment well;Patient limited by fatigue Patient left: in bed;with call bell/phone within reach;with bed alarm set Nurse Communication: Mobility status PT Visit Diagnosis: Unsteadiness on feet (R26.81);Other abnormalities of gait and mobility (R26.89);Muscle weakness (generalized) (M62.81);Difficulty in walking, not elsewhere classified (R26.2)     Time: 1405-1430 PT Time Calculation (min) (ACUTE ONLY): 25 min  Charges:  $Gait Training: 23-37 mins                    Julaine Fusi PTA 05/21/21, 3:25 PM

## 2021-05-21 NOTE — Progress Notes (Signed)
Dressing change performed. Minimal drainage.  Skin well coapted. No s/s residual infection.  Awaiting home health. Dressings 2-3 x's per week.  F/U with podiatry 2 weeks.

## 2021-05-21 NOTE — Plan of Care (Signed)
Patient alert and oriented x 4, denies pain with assess. Labs drawn from PICC, critical values Calcium levels received 13.1, Hospitalist notified and new orders were provided. Right upper extremity PICC remains clean, dry, intact and patent. Vitals stable and no respiratory distress on room air. Scant drainage noted to right foot dressing. Stable condition at end of shift, will continue to monitor.  Problem: Education: Goal: Knowledge of General Education information will improve Description: Including pain rating scale, medication(s)/side effects and non-pharmacologic comfort measures Outcome: Progressing   Problem: Health Behavior/Discharge Planning: Goal: Ability to manage health-related needs will improve Outcome: Progressing   Problem: Clinical Measurements: Goal: Will remain free from infection Outcome: Progressing Goal: Diagnostic test results will improve Outcome: Progressing   Problem: Activity: Goal: Risk for activity intolerance will decrease Outcome: Progressing   Problem: Safety: Goal: Ability to remain free from injury will improve Outcome: Progressing   Problem: Clinical Measurements: Goal: Ability to avoid or minimize complications of infection will improve Outcome: Progressing   Problem: Skin Integrity: Goal: Risk for impaired skin integrity will decrease Outcome: Progressing   Problem: Skin Integrity: Goal: Skin integrity will improve Outcome: Progressing

## 2021-05-21 NOTE — TOC Progression Note (Signed)
Transition of Care [facility]) - Progression Note    Patient Details  Name: [name] MRN: [id number] Date of Birth: 03-11-1966  Transition of Care [facility]) CM/SW [place], RN Phone Number: 05/21/2021, 2:08 PM  Clinical Narrative:   [facility].  Anticipate discharge tomorrow.  Pam from [employer] aware.    Expected Discharge Plan: [place] Barriers to Discharge: Inadequate or no insurance, Continued Medical Work up, Other (must enter comment) (No PCP)  Expected Discharge Plan and Services Expected Discharge Plan: [place] Choice: [employer] arrangements for the past 2 months: Apartment                                       Social Determinants of Health (SDOH) Interventions    Readmission Risk Interventions No flowsheet data found.

## 2021-05-21 NOTE — Progress Notes (Signed)
PROGRESS NOTE    [name]  [id number] DOB: 04-24-1966 DOA: 05/09/2021 PCP: Pcp, No    Brief Narrative:  55 y.o. Caucasian male with medical history significant for type 2 diabetes mellitus, hypertension and dyslipidemia, who presented to the emergency room with acute onset of infected right foot wound.  Due to previous history of right foot wound and osteomyelitis he had a great toe and fifth toe amputated.  Swelling has been extending to his whole foot.  His wound was initially over the plantar aspect of his distal foot and later he developed a wound over the lateral aspect.  He now notices foul-smelling drainage and increasing pain that is becoming throbbing in graded 10/10 in severity, worsening with weightbearing.  The redness and pain has been significantly worsening despite wound care that was being provided by his friend who is a retired Marine scientist.   Patient seen in consultation by podiatry.  MRI pursued with significant osteomyelitis and tissue loss.  Status post transmetatarsal amputation right foot.  Doing well.  Pain well controlled.  ID consulted for antibiotic recommendations.   Week to OR 8/16 for I&D of right foot.  Since then has been waiting for home health charity.  His calcium level has been elevated awaiting ionized calcium and has been treated with Lasix and IV fluids.  Assessment & Plan:   Active Problems:   Foot osteomyelitis, right (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 Flagyl 500 mg 3 times daily x4 weeks, end date 06/13/2021. PICC was placed 8/19 podiatry following.   Dressing changed today.  No purulence noted. Podiatry recommended nonweightbearing to the foot.  Only weightbearing to the heel as needed for transfer.  Dressing changes 3 times a week.  Cleanse wound with saline.  Paint areas with Betadine as needed for maceration.  Cover with bulky sterile bandage. 8/23 follow-up in 2 weeks with podiatry  Follow-up with ID  Continue IV antibiotics as above  Awaiting home health charity to be setup by case mx.     HTN-stable continue amlodipine   Hypercalcemia Unclear etiology, possibly from bone breakdown Improved after hydration normal saline and 1 dose Lasix 8/23 calcium 13 point  We will give a dose of Lasix and continue IV fluids  Will check ionized calcium        Type 2 diabetes mellitus Home sulfonylurea has been held 8/23 BG stable Continue Lantus and R-ISS    Hyperlipidemia Continue statins   DVT prophylaxis: SCDs Code Status: Full Family Communication: None today Disposition Plan: Status is: Inpatient  Remains inpatient appropriate because:Inpatient level of care appropriate due to severity of illness  Dispo: The patient is from: Home              Anticipated d/c is to: Home with home health              Patient currently is not medically stable to d/c.   Difficult to place patient No Continue IV antibiotics.  Needs home health set up  prior to discharge as patient is getting infusion of antibiotics for 4 weeks.  Case management working on this     Level of care: Med-Surg  Consultants:  Podiatry, ID  Procedures:  Transmetatarsal amputation 8/12  Antimicrobials:   IV vancomycin Zosyn-dc'd 8/16 unasyn   Subjective: Has no headache, chest pain, shortness of breath or pain in the leg  Objective: Vitals:   05/20/21 1608 05/20/21 2048 05/21/21 0418 05/21/21 0827  BP: (!) 153/99 (!) 140/91 (!) 143/85 (!) 153/93  Pulse: 79 69 66 66  Resp: '17 16 18 15  ' Temp: 98 F (36.7 C) 98.5 F (36.9 C) 98.4 F (36.9  C) 97.9 F (36.6 C)  TempSrc:  Oral Oral   SpO2: 100% 100% 100% 99%  Weight:      Height:        Intake/Output Summary (Last 24 hours) at 05/21/2021 0828 Last data filed at 05/21/2021 0600 Gross per 24 hour  Intake 2707.97 ml  Output 3025 ml  Net -317.03 ml   Filed Weights   05/09/21 1416 05/10/21 0008  Weight: 97.5 kg 94.3 kg    Examination: NAD, calm CTA no wheeze rales rhonchi's  regular S1-S2 no gallops Soft benign positive bowel sounds No edema   Data Reviewed: I have personally reviewed following labs and imaging studies  CBC: No results for input(s): WBC, NEUTROABS, HGB, HCT, MCV, PLT in the last 168 hours.  Basic Metabolic Panel: Recent Labs  Lab 05/19/21 0416 05/21/21 0540  NA 137 137  K 3.5 3.6  CL 106 103  CO2 27 31  GLUCOSE 164* 161*  BUN 11 10  CREATININE 0.58* 0.68  CALCIUM 13.2* 13.1*   GFR: Estimated Creatinine Clearance: 124.7 mL/min (by C-G formula based on SCr of 0.68 mg/dL). Liver Function Tests: No results for input(s): AST, ALT, ALKPHOS, BILITOT, PROT, ALBUMIN in the last 168 hours.  No results for input(s): LIPASE, AMYLASE in the last 168 hours. No results for input(s): AMMONIA in the last 168 hours. Coagulation Profile: No results for input(s): INR, PROTIME in the last 168 hours.  Cardiac Enzymes: No results for input(s): CKTOTAL, CKMB, CKMBINDEX, TROPONINI in the last 168 hours. BNP (last 3 results) No results for input(s): PROBNP in the last 8760 hours. HbA1C: No results for input(s): HGBA1C in the last 72 hours.  CBG: Recent Labs  Lab 05/20/21 0840 05/20/21 0904 05/20/21 1145 05/20/21 1605 05/20/21 2147  GLUCAP 149* 149* 201* 155* 177*   Lipid Profile: No results for input(s): CHOL, HDL, LDLCALC, TRIG, CHOLHDL, LDLDIRECT in the last 72 hours. Thyroid Function Tests: No results for input(s): TSH, T4TOTAL, FREET4, T3FREE, THYROIDAB in the last 72 hours. Anemia Panel: No results for input(s): VITAMINB12, FOLATE,  FERRITIN, TIBC, IRON, RETICCTPCT in the last 72 hours. Sepsis Labs: No results for input(s): PROCALCITON, LATICACIDVEN in the last 168 hours.   No results found for this or any previous visit (from the past 240 hour(s)).        Radiology Studies: No results found.      Scheduled Meds:  atorvastatin  40 mg Oral QHS   Chlorhexidine Gluconate Cloth  6 each Topical Daily   insulin aspart  0-15 Units Subcutaneous TID WC   insulin glargine-yfgn  5 Units Subcutaneous Daily   metroNIDAZOLE  500 mg Oral Q8H   sodium chloride flush  10-40 mL Intracatheter Q12H   Continuous Infusions:  sodium chloride Stopped (05/13/21 1434)   sodium chloride 75 mL/hr at 05/21/21 0647   cefTRIAXone (ROCEPHIN)  IV 2 g (05/20/21 0855)     LOS: 12 days    Time spent: 35 minutes with more than 50% on [place], MD [facility] Pager [phone]  If 7PM-7AM, please contact night-coverage 05/21/2021, 8:28 AM

## 2021-05-22 DIAGNOSIS — A419 Sepsis, unspecified organism: Secondary | ICD-10-CM

## 2021-05-22 DIAGNOSIS — E118 Type 2 diabetes mellitus with unspecified complications: Secondary | ICD-10-CM

## 2021-05-22 LAB — PARATHYROID HORMONE, INTACT (NO CA): PTH: 240 pg/mL — ABNORMAL HIGH (ref 15–65)

## 2021-05-22 LAB — BASIC METABOLIC PANEL
Anion gap: 6 (ref 5–15)
BUN: 10 mg/dL (ref 6–20)
CO2: 27 mmol/L (ref 22–32)
Calcium: 12.7 mg/dL — ABNORMAL HIGH (ref 8.9–10.3)
Chloride: 104 mmol/L (ref 98–111)
Creatinine, Ser: 0.56 mg/dL — ABNORMAL LOW (ref 0.61–1.24)
GFR, Estimated: >60 mL/min (ref >60)
Glucose, Bld: 184 mg/dL — ABNORMAL HIGH (ref 70–99)
Potassium: 3.4 mmol/L — ABNORMAL LOW (ref 3.5–5.1)
Sodium: 137 mmol/L (ref 135–145)

## 2021-05-22 LAB — CALCIUM, IONIZED: Calcium, Ionized, Serum: 7.4 mg/dL — ABNORMAL HIGH (ref 4.5–5.6)

## 2021-05-22 LAB — GLUCOSE, CAPILLARY: Glucose-Capillary: 175 mg/dL — ABNORMAL HIGH (ref 70–99)

## 2021-05-22 MED ORDER — OXYCODONE HCL 5 MG PO TABS: 5.0000 mg | ORAL_TABLET | Freq: Four times a day (QID) | ORAL | 0 refills | Status: AC | PRN

## 2021-05-22 MED ORDER — METRONIDAZOLE 500 MG PO TABS: 500.0000 mg | ORAL_TABLET | Freq: Three times a day (TID) | ORAL | 0 refills | Status: DC

## 2021-05-22 MED ORDER — CEFTRIAXONE IV (FOR PTA / DISCHARGE USE ONLY): 2.0000 g | INTRAVENOUS | 0 refills | Status: DC

## 2021-05-22 MED ORDER — ACETAMINOPHEN 325 MG PO TABS: 650.0000 mg | ORAL_TABLET | Freq: Four times a day (QID) | ORAL | Status: AC | PRN

## 2021-05-22 NOTE — Discharge Summary (Addendum)
Physician Discharge Summary  [name] QZE:[id number] DOB: 1965-12-31 DOA: 05/09/2021  PCP: Pcp, No  Admit date: 05/09/2021 Discharge date: 05/22/2021  Admitted From: Home  Disposition:  home   Recommendations for Outpatient Follow-up and new medication changes:  Follow up with Primary care in 7 to 10 days.  Nonweightbearing of the right foot.  Only weightbearing to the heel as needed for transfer.  Dressing changes 3 times weekly.  Cleanse wound with saline.  Paint areas with with Betadine as needed for maceration.  Cover with bulky sterile bandage. Follow-up with podiatry as scheduled  Follow up as outpatient for primary hyperparathyroidism.  Continue IV antibiotic therapy, end date 06/13/2021.  Ceftriaxone/metronidazole.  Discharge antibiotics: Ceftriaxone 2 grams IV every 24 hours  + PO flagyl 531m TID   4 weeks End Date: 06/13/21   [facility]:including placement of biopatch   Labs weekly on Monday while on IV antibiotics: CBC with differential  CMP   Once every 2 weeks  CRP ESR  Please pull PIC at completion of IV antibiotics Fax weekly labs to Dr.[phone]  Clinic Follow Up Appt:06/06/21 at 9.45AM  Home Health: yes   Equipment/Devices: NA    Discharge Condition: stable  CODE STATUS: full  Diet recommendation:  heart healthy and diabetic prudent.   Brief/Interim Summary: [name] admitted the hospital with the working diagnosis of right foot osteomyelitis / cellulitis, infected diabetic foot, complicated with sepsis.   55yo male with the past medical history of T2DM, HTN, and dyslipidemia who presented with right foot wound.  At home he had worsening right foot wound, initially over the plantar aspect distal then progressed to the lateral aspect.  Positive purulent drainage, associated with worsening pain.  Persistent symptoms despite wound care at home.  On his initial physical examination his blood pressure was 140/90, heart rate  78-93, temperature 97.8, respiratory rate 18, oxygen saturation 99%.  His lungs are clear to auscultation, heart S1-S2, present, rhythmic, soft abdomen, his right foot had prior amputation of the first and fifth metatarsals, positive oval-shaped wound, medial distal plantar and distal lateral, associated edema and erythema. (See picture in the medical record.)  Sodium 130, potassium 3.4, chloride 96, bicarb 31, glucose 157, BUN 12, creatinine 0.7, CRP 6.6, white count 12.8, hemoglobin 15.0, hematocrit 42.3, platelets 244. SARS COVID-19 negative.  Urinalysis specific gravity 1.014, 6-10 white cells, 0-5 red cells.  Right foot radiograph with mild displaced fracture of the distal second metatarsal neck which could be pathologic from underlying infection/osteomyelitis.  Forefoot soft tissue swelling and plantar soft tissue ulcer.  Subluxation/dislocation of second and third MTP joints.  Prior great toe amputation and fifth ray amputations.  Right foot MRI with substantial osteomyelitis of the forefoot, most striking involving the second metatarsal and second toe where there is a draining sinus tract extending to the plantar cutaneous surface below the second metatarsal head, along with destructive findings in the second metatarsal head and proximal metaphysis.  Active osteomyelitis of the phalanges of the third and fourth toes and distally in the third and fourth metatarsals.  Myositis involving the plantar musculature of the foot.  Small amount of callus dorsally in the third toe.  Flexor hallucis longus distal tendinopathy.  Patient was placed on broad-spectrum antibiotic therapy.  Underwent transmetatarsal amputation 8/12, with I&D of residual abscess of right foot 8/16.  PICC line was placed for outpatient IV antibiotic therapy.  Patient clinically improving, will continue outpatient follow-up with home health services,  IV antibiotic therapy and podiatry.  Discharge Diagnoses:  Principal Problem:    Foot osteomyelitis, right ([place]) Active Problems:   Type 2 diabetes mellitus with complication, without long-term current use of insulin ([facility])   HTN (hypertension)   Hypercalcemia   Sepsis ([place])  Right foot osteomyelitis, diabetic foot infection, cellulitis, complicated by sepsis, present on admission. Patient has responded well to medical and surgical therapy.  His surgical culture was positive for rare Proteus mirabilis.  Abundant Bacteroides species.  Beta-lactamase negative. Infectious disease was consulted.  Recommendations to continue antibiotic therapy until 06/13/2021. Follow-up with ID clinic 06/06/2021.  Home health services have been arranged.  2.  Hypertension.  His blood pressure remained well controlled with amlodipine.  His hemoglobin A1c is 6.4  3.  Type 2 diabetes mellitus/ dyslipidemia.  His fasting glucose at discharge 184. Continue with atorvastatin.  At discharger to resume glipizide   4.  Primary hyperparathyroidism.  PTH 240, discharge serum calcium is 12.7.  We will recommend further follow-up as an outpatient once infection has been controlled. Patient was informed about follow up.   Discharge Instructions   Allergies as of 05/22/2021   No Known Allergies      Medication List     TAKE these medications    acetaminophen 325 MG tablet Commonly known as: TYLENOL Take 2 tablets (650 mg total) by mouth every 6 (six) hours as needed for mild pain (or Fever >/= 101).   atorvastatin 40 MG tablet Commonly known as: LIPITOR Take 40 mg by mouth daily.   cefTRIAXone  IVPB Commonly known as: ROCEPHIN Inject 2 g into the vein daily for 28 days. Indication: Diabetic foot infection with osteomyelitis First Dose: Yes Last Day of Therapy:  06/13/2021 Labs - Once weekly on Mondays:  CBC/D and CMP, Labs - Every other week:  ESR and CRP Remove PICC with completion of antibiotic Fax weekly labs to Dr.Ravishankar [phone] Method of administration: IV  Push Method of administration may be changed at the discretion of home infusion pharmacist based upon assessment of the patient and/or caregiver's ability to self-administer the medication ordered.   glipiZIDE 10 MG 24 hr tablet Commonly known as: GLUCOTROL XL Take 20 mg by mouth daily with breakfast.   metroNIDAZOLE 500 MG tablet Commonly known as: FLAGYL Take 1 tablet (500 mg total) by mouth every 8 (eight) hours for 22 days.   oxyCODONE 5 MG immediate release tablet Commonly known as: Oxy IR/ROXICODONE Take 1 tablet (5 mg total) by mouth every 6 (six) hours as needed for moderate pain.               Discharge Care Instructions  (From admission, onward)           Start     Ordered   05/22/21 0000  Change dressing on IV access line weekly and PRN  (Home infusion instructions - [facility] )        05/22/21 1011   05/22/21 0000  Discharge wound care:       Comments: Nonweightbearing of the right foot.  Only weightbearing to the heel as needed for transfer.  Dressing changes 3 times weekly.  Cleanse wound with saline.  Paint areas with with Betadine as needed for maceration.  Cover with bulky sterile bandage.   05/22/21 1011            No Known Allergies  Consultations: ID Orthopedics    Procedures/Studies: MR FOOT RIGHT WO CONTRAST  Result Date: 05/10/2021  CLINICAL DATA:  Osteomyelitis of the foot.  Chronic foot wounds. EXAM: MRI OF THE RIGHT FOREFOOT WITHOUT CONTRAST TECHNIQUE: Multiplanar, multisequence MR imaging of the right forefoot was performed. No intravenous contrast was administered. COMPARISON:  05/09/2021 radiograph and prior MRI from 07/09/2020 FINDINGS: Bones/Joint/Cartilage Prior amputations of the fifth ray at the proximal metatarsal, and the first ray at the MTP joint. Fracture/bony destruction in the distal metaphysis of the second metatarsal with extensive edema in the metatarsal head and shaft favoring active osteomyelitis. Dorsal  dislocation of the second proximal phalanx with respect to the metatarsal head. Extensive edema signal throughout the marrow of the phalanges of the second toe compatible with osteomyelitis. Plantar draining sinus tract from the head of the second metatarsal to the cutaneous surface as shown on image 15 series 5. Extensive marrow edema in the phalanges of the third digit and in the distal half of the third metatarsal compatible with active osteomyelitis. Abnormal edema in the head of the fourth metatarsal and in the phalanges of the fourth toe compatible with active osteomyelitis. Mild abnormal edema in the first metatarsal head and in the adjacent sesamoids, probably degenerative and less likely from osteomyelitis. Substantial degenerative findings in the midfoot and along the Lisfranc joint with associated spurring and degenerative subcortical foci of edema. Low-level likely reactive marrow edema in the cuboid. Ligaments Lisfranc ligament intact Muscles and Tendons Edema tracks in the plantar musculature of the foot, suspicious for myositis. Flexor hallucis longus tendinopathy distally with fusiform expansion and irregularity of the distal tendon for example on image 33 series 5. Soft tissues Small amount of gas in the dorsal soft tissues of the third toe compatible with infection. Subcutaneous edema along the dorsum of the foot. Possible ulceration lateral to the fifth metatarsal head. Draining sinus tract from the second metatarsal head to the plantar surface of the foot. IMPRESSION: 1. Substantial osteomyelitis of the forefoot, most striking involving the second metatarsal and second toe where there is a draining sinus tract extending to the plantar cutaneous surface below the second metatarsal head, along with destructive findings in the second metatarsal head and proximal metaphysis. Active osteomyelitis of the phalanges of the third and fourth toes and distally in the third and fourth metatarsals. 2. Prior  fifth ray amputation at the proximal metaphysis. Prior first digit amputation at the MTP joint. Low-grade edema along the first metatarsal head and adjacent sesamoids is probably due to arthropathy, less likely osteomyelitis. 3. Degenerative findings in the midfoot and Lisfranc joint. 4. Myositis involving the plantar musculature of the foot. 5. Small amount of gas dorsally in the third toe. 6. Flexor hallucis longus distal tendinopathy. Electronically Signed   By: Van Clines M.D.   On: 05/10/2021 04:58   DG Foot Complete Right  Result Date: 05/09/2021 CLINICAL DATA:  Foot infection EXAM: RIGHT FOOT COMPLETE - 3+ VIEW COMPARISON:  Toe radiograph 07/05/2020, foot MRI 07/09/2020 FINDINGS: Prior great toe amputation. There is a mildly displaced fracture of the distal second metatarsal neck. There is subluxation of the second MTP joint and dislocation of the third MTP joints. Prior fifth ray amputation. 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 Forefoot soft tissue swelling with plantar soft tissue ulcer. IMPRESSION: Mild displaced fracture of the distal second metatarsal neck, which could be pathologic from underlying infection/osteomyelitis. Forefoot soft tissue swelling with plantar soft tissue ulcer. Subluxation/dislocation of the second and third MTP joints. Prior great toe amputation and fifth ray amputations. Electronically Signed   By: Edison Nasuti  Chancy Milroy M.D.   On: 05/09/2021 15:38   DG MINI C-ARM IMAGE ONLY  Result Date: 05/10/2021 There is no interpretation for this exam.  This order is for images obtained during a surgical procedure.  Please See "Surgeries" Tab for more information regarding the procedure.   [place] EKG SITE RITE  Result Date: 05/15/2021 If Site Rite image not attached, placement could not be confirmed due to current cardiac rhythm.    Procedures:  Right foot transmetatarsal amputation and I&D right foot abscess.   Subjective: Patient is  feeling well, his right foot pain is controlled, no nausea or vomiting, no chest pain or dyspnea.   Discharge Exam: Vitals:   05/22/21 0529 05/22/21 0728  BP: (!) 144/89 136/86  Pulse: 66 63  Resp: 17 16  Temp: 97.8 F (36.6 C) 98.1 F (36.7 C)  SpO2: 99% 100%   Vitals:   05/21/21 1944 05/21/21 2339 05/22/21 0529 05/22/21 0728  BP: 118/86 134/84 (!) 144/89 136/86  Pulse: 73 66 66 63  Resp: _0 Temp: 98 F (36.7 C) 98.2 F (36.8 C) 97.8 F (36.6 C) 98.1 F (36.7 C)  TempSrc:      SpO2: 100% 100% 99% 100%  Weight:      Height:        General: Not in pain or dyspnea.  Neurology: Awake and alert, non focal  E ENT: no pallor, no icterus, oral mucosa moist Cardiovascular: No JVD. S1-S2 present, rhythmic, no gallops, rubs, or murmurs. No lower extremity edema. Pulmonary: vesicular breath sounds bilaterally, adequate air movement, no wheezing, rhonchi or rales. Gastrointestinal. Abdomen soft and non tender Skin. No rashes Musculoskeletal: right foot with dressing in place.   The results of significant diagnostics from this hospitalization (including imaging, microbiology, ancillary and laboratory) are listed below for reference.     Microbiology: No results found for this or any previous visit (from the past 240 hour(s)).   Labs: BNP (last 3 results) No results for input(s): BNP in the last 8760 hours. Basic Metabolic Panel: Recent Labs  Lab 05/19/21 0416 05/21/21 0540 05/22/21 0743  NA 137 137 137  K 3.5 3.6 3.4*  CL 106 103 104  CO2 _1 GLUCOSE 164* 161* 184*  BUN _2 CREATININE 0.58* 0.68 0.56*  CALCIUM 13.2* 13.1* 12.7*   Liver Function Tests: No results for input(s): AST, ALT, ALKPHOS, BILITOT, PROT, ALBUMIN in the last 168 hours. No results for input(s): LIPASE, AMYLASE in the last 168 hours. No results for input(s): AMMONIA in the last 168 hours. CBC: Recent Labs  Lab 05/21/21 1259  WBC 5.5  HGB 14.7  HCT 42.1  MCV 94.0   PLT 199   Cardiac Enzymes: No results for input(s): CKTOTAL, CKMB, CKMBINDEX, TROPONINI in the last 168 hours. BNP: Invalid input(s): POCBNP CBG: Recent Labs  Lab 05/21/21 0828 05/21/21 1132 05/21/21 1553 05/21/21 2120 05/22/21 0729  GLUCAP 197* 244* 239* 175* 175*   D-Dimer No results for input(s): DDIMER in the last 72 hours. Hgb A1c No results for input(s): HGBA1C in the last 72 hours. Lipid Profile No results for input(s): CHOL, HDL, LDLCALC, TRIG, CHOLHDL, LDLDIRECT in the last 72 hours. Thyroid function studies No results for input(s): TSH, T4TOTAL, T3FREE, THYROIDAB in the last 72 hours.  Invalid input(s): FREET3 Anemia work up No results for input(s): VITAMINB12, FOLATE, FERRITIN, TIBC, IRON, RETICCTPCT in the last 72 hours. Urinalysis    Component Value Date/Time   COLORURINE YELLOW (  A) 05/09/2021 1425   APPEARANCEUR CLEAR (A) 05/09/2021 1425   LABSPEC 1.014 05/09/2021 1425   PHURINE 5.0 05/09/2021 1425   GLUCOSEU NEGATIVE 05/09/2021 1425   HGBUR NEGATIVE 05/09/2021 [place] 05/09/2021 [place] 05/09/2021 1425   PROTEINUR NEGATIVE 05/09/2021 1425   NITRITE NEGATIVE 05/09/2021 1425   LEUKOCYTESUR NEGATIVE 05/09/2021 1425   Sepsis Labs Invalid input(s): PROCALCITONIN,  WBC,  LACTICIDVEN Microbiology No results found for this or any previous visit (from the past 240 hour(s)).   Time coordinating discharge: 45 minutes  SIGNED:   Tawni Millers, MD  [facility] 05/22/2021, 9:30 AM

## 2021-05-22 NOTE — Progress Notes (Signed)
Pt given discharge instructions. Pt verbalized understanding. Pt waiting on ride.

## 2021-05-22 NOTE — Plan of Care (Signed)
No acute events overnight. Problem: Education: Goal: Knowledge of General Education information will improve Description: Including pain rating scale, medication(s)/side effects and non-pharmacologic comfort measures Outcome: Progressing   Problem: Health Behavior/Discharge Planning: Goal: Ability to manage health-related needs will improve Outcome: Progressing   Problem: Clinical Measurements: Goal: Will remain free from infection Outcome: Progressing Goal: Diagnostic test results will improve Outcome: Progressing   Problem: Activity: Goal: Risk for activity intolerance will decrease Outcome: Progressing   Problem: Pain Managment: Goal: General experience of comfort will improve Outcome: Progressing   Problem: Safety: Goal: Ability to remain free from injury will improve Outcome: Progressing   Problem: Skin Integrity: Goal: Risk for impaired skin integrity will decrease Outcome: Progressing   Problem: Clinical Measurements: Goal: Ability to avoid or minimize complications of infection will improve Outcome: Progressing   Problem: Skin Integrity: Goal: Skin integrity will improve Outcome: Progressing

## 2021-05-22 NOTE — Plan of Care (Signed)
Patient discharged home per MD orders at this time.All discharge instructions,education and medication reviewed with patient.Pt expressed understanding and will comply with dc instructions.follow up appointments also communicated to patient.no verbal c/o or any ssx of distress.Pt discharged home with HH/nursing services,wound care and IV Abx therapy per order.patient was transported home by friend in a private car.

## 2021-05-22 NOTE — TOC Progression Note (Signed)
Transition of Care [facility]) - Progression Note    Patient Details  Name: [name] MRN: [id number] Date of Birth: 07-04-1966  Transition [facility]) CM/SW [place], RN Phone Number: 05/22/2021, 11:12 AM  Clinical Narrative:   Patient can be discharged home to day [facility] Cornea at [facility] Carolynn Sayers at [facility] notified.  Patient states he has 3 n 1 and walker at home. Patient would like wheelchair, Rhonda at [employer] notified.  Patient states he feels comfortable going home.   Expected Discharge Plan: [place] Barriers to Discharge: Inadequate or no insurance, Continued Medical Work up, Other (must enter comment) (No PCP)  Expected Discharge Plan and Services Expected Discharge Plan: [place] Choice: [place] arrangements for the past 2 months: Apartment Expected Discharge Date: 05/22/21                                     Social Determinants of Health (SDOH) Interventions    Readmission Risk Interventions No flowsheet data found.

## 2021-06-06 ENCOUNTER — Inpatient Hospital Stay: Payer: Self-pay | Admitting: Infectious Diseases

## 2021-06-13 ENCOUNTER — Ambulatory Visit: Payer: Self-pay | Attending: Infectious Diseases | Admitting: Infectious Diseases

## 2021-06-13 ENCOUNTER — Other Ambulatory Visit: Payer: Self-pay

## 2021-06-13 ENCOUNTER — Encounter: Payer: Self-pay | Admitting: Infectious Diseases

## 2021-06-13 ENCOUNTER — Telehealth: Payer: Self-pay | Admitting: Gerontology

## 2021-06-13 VITALS — BP 135/89 | HR 116 | Temp 98.0°F | Resp 16 | Ht 75.0 in | Wt 206.0 lb

## 2021-06-13 DIAGNOSIS — E210 Primary hyperparathyroidism: Secondary | ICD-10-CM | POA: Insufficient documentation

## 2021-06-13 DIAGNOSIS — E11628 Type 2 diabetes mellitus with other skin complications: Secondary | ICD-10-CM | POA: Insufficient documentation

## 2021-06-13 DIAGNOSIS — M86171 Other acute osteomyelitis, right ankle and foot: Secondary | ICD-10-CM | POA: Insufficient documentation

## 2021-06-13 DIAGNOSIS — Z792 Long term (current) use of antibiotics: Secondary | ICD-10-CM | POA: Insufficient documentation

## 2021-06-13 DIAGNOSIS — Z79899 Other long term (current) drug therapy: Secondary | ICD-10-CM | POA: Insufficient documentation

## 2021-06-13 DIAGNOSIS — M609 Myositis, unspecified: Secondary | ICD-10-CM | POA: Insufficient documentation

## 2021-06-13 DIAGNOSIS — Z89411 Acquired absence of right great toe: Secondary | ICD-10-CM | POA: Insufficient documentation

## 2021-06-13 DIAGNOSIS — F17290 Nicotine dependence, other tobacco product, uncomplicated: Secondary | ICD-10-CM | POA: Insufficient documentation

## 2021-06-13 DIAGNOSIS — Z7984 Long term (current) use of oral hypoglycemic drugs: Secondary | ICD-10-CM | POA: Insufficient documentation

## 2021-06-13 DIAGNOSIS — L089 Local infection of the skin and subcutaneous tissue, unspecified: Secondary | ICD-10-CM

## 2021-06-13 DIAGNOSIS — E1169 Type 2 diabetes mellitus with other specified complication: Secondary | ICD-10-CM | POA: Insufficient documentation

## 2021-06-13 DIAGNOSIS — Z89429 Acquired absence of other toe(s), unspecified side: Secondary | ICD-10-CM | POA: Insufficient documentation

## 2021-06-13 MED ORDER — AMOXICILLIN-POT CLAVULANATE 875-125 MG PO TABS
1.0000 | ORAL_TABLET | Freq: Two times a day (BID) | ORAL | 0 refills | Status: AC
  Filled 2021-06-13: qty 28, 14d supply, fill #0

## 2021-06-13 NOTE — Telephone Encounter (Signed)
Informed, just found another provider

## 2021-06-13 NOTE — Patient Instructions (Signed)
You are here for follow up of rt foot infection which looks very good- I have removed your piCc as you have completed IV- sent augmentin 875 mg PO BID for 2 weeks- you can collect it from the medication management clinic

## 2021-06-13 NOTE — Progress Notes (Signed)
NAME: [name]  DOB: 09-17-1966  MRN: [id number]  Date/Time: 06/13/2021 9:17 AM  Subjective:  Follow up visit after recent hospital discharge ? [name] is a 55 y.o. male with a history of DM,  diabetic foot infection , h/o rt 5th toe amputation in 2020, rt great toe amputation in oct 2021, was hospitalized between 05/09/21-05/22/21 with rt foot infection.  Right foot MRI with substantial osteomyelitis of the forefoot, most striking involving the second metatarsal and second toe where there is a draining sinus tract extending to the plantar cutaneous surface below the second metatarsal head, along with destructive findings in the second metatarsal head and proximal metaphysis.  Active osteomyelitis of the phalanges of the third and fourth toes and distally in the third and fourth metatarsals.  Myositis involving the plantar musculature of the foot.  Small amount of callus dorsally in the third toe.  Flexor hallucis longus distal tendinopathy.   Patient was placed on broad-spectrum antibiotic therapy.  Underwent transmetatarsal amputation 8/12, with I&D of residual abscess of right foot 8/16. Culture was proteus and bacteroides. 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- He was discharged home to complete 4 weeks of IV ceftriaxone + flagyl and then to follow by PO Augmentin if needed He is here for follow up and doing much better He has no discharge, pain or swelling rt foot. He said his dog licked the surgical wound one time .  He has no fveer or diarrhea- he has been taking Iv antibiotic every day and has completed the treatment.  Past Medical History:  Diagnosis Date   Diabetes [facility]    Hypertension     Past Surgical History:  Procedure Laterality Date   AMPUTATION TOE Right 07/11/2020   Procedure: AMPUTATION TOE-Right Great Toe;  Surgeon: Samara Deist, DPM;  Location: [facility] ORS;  Service: Podiatry;  Laterality: Right;   I & D EXTREMITY Right 05/14/2021   Procedure:  IRRIGATION AND DEBRIDEMENT EXTREMITY- RIGHT FOOT;  Surgeon: Samara Deist, DPM;  Location: [facility];  Service: Podiatry;  Laterality: Right;   IRRIGATION AND DEBRIDEMENT ABSCESS Right 10/31/2018   Procedure: IRRIGATION AND DEBRIDEMENT ABSCESS;  Surgeon: Samara Deist, DPM;  Location: [facility];  Service: Podiatry;  Laterality: Right;   IRRIGATION AND DEBRIDEMENT FOOT Right 11/02/2018   Procedure: IRRIGATION AND DEBRIDEMENT FOOT;  Surgeon: Sharlotte Alamo, DPM;  Location: [facility];  Service: Podiatry;  Laterality: Right;   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   Procedure: TRANSMETATARSAL AMPUTATION;  Surgeon: Samara Deist, DPM;  Location: [facility];  Service: Podiatry;  Laterality: Right;    Social History   Socioeconomic History   Marital status: Single    Spouse name: Not on file   Number of children: Not on file   Years of education: Not on file   Highest education level: Not on file  Occupational History   Not on file  Tobacco Use   Smoking status: Light Smoker    Types: Cigars   Smokeless tobacco: Never  Vaping Use   Vaping Use: Never used  Substance and Sexual Activity   Alcohol use: Yes    Comment: "too much"    Drug use: Never   Sexual activity: Yes  Other Topics Concern   Not on file  Social History Narrative   Not on file   Social Determinants of Health   Financial Resource Strain: Not on file  Food Insecurity: Not on file  Transportation Needs: Not on file  Physical Activity: Not on file  Stress:  Not on file  Social Connections: Not on file  Intimate Partner Violence: Not on file    Family History  Problem Relation Age of Onset   CAD Father    CAD Brother    No Known Allergies I? Current Outpatient Medications  Medication Sig Dispense Refill   acetaminophen (TYLENOL) 325 MG tablet Take 2 tablets (650 mg total) by mouth every 6 (six) hours as needed for mild pain (or Fever >/= 101).     atorvastatin (LIPITOR) 40 MG tablet Take 40 mg by mouth daily.      glipiZIDE (GLUCOTROL XL) 10 MG 24 hr tablet Take 20 mg by mouth daily with breakfast.     cefTRIAXone (ROCEPHIN) IVPB Inject 2 g into the vein daily for 28 days. Indication: Diabetic foot infection with osteomyelitis First Dose: Yes Last Day of Therapy:  06/13/2021 Labs - Once weekly on Mondays:  CBC/D and CMP, Labs - Every other week:  ESR and CRP Remove PICC with completion of antibiotic Fax weekly labs to Dr.Revella Shelton [phone] Method of administration: IV Push Method of administration may be changed at the discretion of home infusion pharmacist based upon assessment of the patient and/or caregiver's ability to self-administer the medication ordered. (Patient not taking: Reported on 06/13/2021) 29 Units 0   metroNIDAZOLE (FLAGYL) 500 MG tablet Take 1 tablet (500 mg total) by mouth every 8 (eight) hours for 22 days. (Patient not taking: Reported on 06/13/2021) 66 tablet 0   oxyCODONE (OXY IR/ROXICODONE) 5 MG immediate release tablet Take 1 tablet (5 mg total) by mouth every 6 (six) hours as needed for moderate pain. (Patient not taking: Reported on 06/13/2021) 10 tablet 0   No current facility-administered medications for this visit.     Abtx:  Anti-infectives (From admission, onward)    None       REVIEW OF SYSTEMS:  Const: negative fever, negative chills, positive weight loss Eyes: negative diplopia or visual changes, negative eye pain ENT: negative coryza, negative sore throat Resp: negative cough, hemoptysis, dyspnea Cards: negative for chest pain, palpitations, lower extremity edema GU: negative for frequency, dysuria and hematuria GI: Negative for abdominal pain, diarrhea, bleeding, constipation Skin: negative for rash and pruritus Heme: negative for easy bruising and gum/nose bleeding MS: negative for myalgias, arthralgias, back pain and muscle weakness Neurolo:negative for headaches, dizziness, vertigo, memory problems  Psych: negative for feelings of anxiety,  depression  Endocrine: , diabetes- says sugar 90-120 fasting Allergy/Immunology- negative for any medication or food allergies  Objective:  VITALS:  BP 135/89   Pulse (!) 116   Temp 98 F (36.7 C) (Oral)   Resp 16   Ht '6\' 3"'  (1.905 m)   Wt 206 lb (93.4 kg)   SpO2 98%   BMI 25.75 kg/m  PHYSICAL EXAM:  General: Alert, cooperative, no distress, appears stated age.  Lungs: Clear to auscultation bilaterally. No Wheezing or Rhonchi. No rales. Heart: Regular rate and rhythm, no murmur, rub or gallop. Abdomen: Soft, non-tender,not distended. Bowel sounds normal. No masses Extremities: rt foot- TMA- site has healed well- sutures in place- no erythema or discharge  06/12/21     05/09/21       Skin: No rashes or lesions. Or bruising Lymph: Cervical, supraclavicular normal. Neurologic: Grossly non-focal Pertinent Labs Lab Results CBC 06/10/21 Cr 0.56 Ca 12.1 Plt 144 Wbc 4.2 Hb 16.2 ? Impression/Recommendation ?Diabetic foot infection Rt- s/p Transmetatarsal amputation on 05/10/21- residual osteomyelitis in the proximal margin. Proteus and bacteroides in culture. On ceftriaxone  and flagyl - completed 30 days of treatment today-  Foot looks great. He has an appt with Dr.Fowler to remove sutures today PICC line removed 42 cms intact Pt may  go on Augmentin for 2 weeks- will discuss with Dr.Fowler  DM-on glipizide  Hyperlipidemia on atorvastatin  ?Hypercacemia with primary hyperparathyroidism- to follow up with endocrinologist ? __discussed with patient in detail No follow up needed

## 2021-06-18 ENCOUNTER — Telehealth: Payer: Self-pay

## 2021-06-18 NOTE — Telephone Encounter (Signed)
Patient is feeling better. Dr Vickki Muff said he is healing well and he removed sutures. He will see him again 07/09/21. Patient has not gone to endocrinology as it was his understanding that calcium was high due to all the medications he was given while in hospital. He will ask Dr Vickki Muff if he can recheck calcium at next visit. Also was given information on PCP close to his area([facility]). He will schedule PCP appt.

## 2021-09-07 ENCOUNTER — Inpatient Hospital Stay: Payer: Medicaid Other

## 2021-09-07 ENCOUNTER — Inpatient Hospital Stay
Admission: EM | Admit: 2021-09-07 | Discharge: 2021-09-29 | DRG: 853 | Disposition: E | Payer: Medicaid Other | Attending: Internal Medicine | Admitting: Internal Medicine

## 2021-09-07 ENCOUNTER — Emergency Department: Payer: Medicaid Other

## 2021-09-07 ENCOUNTER — Other Ambulatory Visit: Payer: Self-pay

## 2021-09-07 ENCOUNTER — Encounter: Payer: Self-pay | Admitting: Internal Medicine

## 2021-09-07 DIAGNOSIS — I952 Hypotension due to drugs: Secondary | ICD-10-CM | POA: Diagnosis not present

## 2021-09-07 DIAGNOSIS — E1169 Type 2 diabetes mellitus with other specified complication: Secondary | ICD-10-CM | POA: Diagnosis present

## 2021-09-07 DIAGNOSIS — Z91138 Patient's unintentional underdosing of medication regimen for other reason: Secondary | ICD-10-CM

## 2021-09-07 DIAGNOSIS — I214 Non-ST elevation (NSTEMI) myocardial infarction: Secondary | ICD-10-CM | POA: Diagnosis present

## 2021-09-07 DIAGNOSIS — E875 Hyperkalemia: Secondary | ICD-10-CM | POA: Diagnosis not present

## 2021-09-07 DIAGNOSIS — Z6825 Body mass index (BMI) 25.0-25.9, adult: Secondary | ICD-10-CM

## 2021-09-07 DIAGNOSIS — E1152 Type 2 diabetes mellitus with diabetic peripheral angiopathy with gangrene: Secondary | ICD-10-CM | POA: Diagnosis present

## 2021-09-07 DIAGNOSIS — Z8249 Family history of ischemic heart disease and other diseases of the circulatory system: Secondary | ICD-10-CM

## 2021-09-07 DIAGNOSIS — A419 Sepsis, unspecified organism: Secondary | ICD-10-CM | POA: Diagnosis present

## 2021-09-07 DIAGNOSIS — G4733 Obstructive sleep apnea (adult) (pediatric): Secondary | ICD-10-CM | POA: Diagnosis present

## 2021-09-07 DIAGNOSIS — A4101 Sepsis due to Methicillin susceptible Staphylococcus aureus: Principal | ICD-10-CM | POA: Diagnosis present

## 2021-09-07 DIAGNOSIS — J9601 Acute respiratory failure with hypoxia: Secondary | ICD-10-CM | POA: Diagnosis not present

## 2021-09-07 DIAGNOSIS — Z79899 Other long term (current) drug therapy: Secondary | ICD-10-CM

## 2021-09-07 DIAGNOSIS — E440 Moderate protein-calorie malnutrition: Secondary | ICD-10-CM | POA: Diagnosis present

## 2021-09-07 DIAGNOSIS — Z20822 Contact with and (suspected) exposure to covid-19: Secondary | ICD-10-CM | POA: Diagnosis present

## 2021-09-07 DIAGNOSIS — Z89431 Acquired absence of right foot: Secondary | ICD-10-CM

## 2021-09-07 DIAGNOSIS — F17290 Nicotine dependence, other tobacco product, uncomplicated: Secondary | ICD-10-CM | POA: Diagnosis present

## 2021-09-07 DIAGNOSIS — D351 Benign neoplasm of parathyroid gland: Secondary | ICD-10-CM | POA: Diagnosis present

## 2021-09-07 DIAGNOSIS — Y835 Amputation of limb(s) as the cause of abnormal reaction of the patient, or of later complication, without mention of misadventure at the time of the procedure: Secondary | ICD-10-CM | POA: Diagnosis not present

## 2021-09-07 DIAGNOSIS — F1020 Alcohol dependence, uncomplicated: Secondary | ICD-10-CM | POA: Diagnosis present

## 2021-09-07 DIAGNOSIS — Z7984 Long term (current) use of oral hypoglycemic drugs: Secondary | ICD-10-CM

## 2021-09-07 DIAGNOSIS — R6520 Severe sepsis without septic shock: Secondary | ICD-10-CM | POA: Diagnosis present

## 2021-09-07 DIAGNOSIS — Z515 Encounter for palliative care: Secondary | ICD-10-CM | POA: Diagnosis not present

## 2021-09-07 DIAGNOSIS — R5383 Other fatigue: Secondary | ICD-10-CM | POA: Diagnosis present

## 2021-09-07 DIAGNOSIS — I5021 Acute systolic (congestive) heart failure: Secondary | ICD-10-CM | POA: Diagnosis present

## 2021-09-07 DIAGNOSIS — Z23 Encounter for immunization: Secondary | ICD-10-CM | POA: Diagnosis not present

## 2021-09-07 DIAGNOSIS — M86171 Other acute osteomyelitis, right ankle and foot: Secondary | ICD-10-CM | POA: Diagnosis present

## 2021-09-07 DIAGNOSIS — L97514 Non-pressure chronic ulcer of other part of right foot with necrosis of bone: Secondary | ICD-10-CM

## 2021-09-07 DIAGNOSIS — I110 Hypertensive heart disease with heart failure: Secondary | ICD-10-CM | POA: Diagnosis present

## 2021-09-07 DIAGNOSIS — G9341 Metabolic encephalopathy: Secondary | ICD-10-CM | POA: Diagnosis present

## 2021-09-07 DIAGNOSIS — E785 Hyperlipidemia, unspecified: Secondary | ICD-10-CM | POA: Diagnosis present

## 2021-09-07 DIAGNOSIS — F10231 Alcohol dependence with withdrawal delirium: Secondary | ICD-10-CM | POA: Diagnosis not present

## 2021-09-07 DIAGNOSIS — E1165 Type 2 diabetes mellitus with hyperglycemia: Secondary | ICD-10-CM | POA: Diagnosis present

## 2021-09-07 DIAGNOSIS — Z01818 Encounter for other preprocedural examination: Secondary | ICD-10-CM

## 2021-09-07 DIAGNOSIS — I5020 Unspecified systolic (congestive) heart failure: Secondary | ICD-10-CM

## 2021-09-07 DIAGNOSIS — L97516 Non-pressure chronic ulcer of other part of right foot with bone involvement without evidence of necrosis: Secondary | ICD-10-CM | POA: Diagnosis present

## 2021-09-07 DIAGNOSIS — L97509 Non-pressure chronic ulcer of other part of unspecified foot with unspecified severity: Secondary | ICD-10-CM | POA: Diagnosis present

## 2021-09-07 DIAGNOSIS — T8743 Infection of amputation stump, right lower extremity: Secondary | ICD-10-CM | POA: Diagnosis not present

## 2021-09-07 DIAGNOSIS — E210 Primary hyperparathyroidism: Secondary | ICD-10-CM | POA: Diagnosis present

## 2021-09-07 DIAGNOSIS — T402X5A Adverse effect of other opioids, initial encounter: Secondary | ICD-10-CM | POA: Diagnosis not present

## 2021-09-07 DIAGNOSIS — Z66 Do not resuscitate: Secondary | ICD-10-CM | POA: Diagnosis not present

## 2021-09-07 DIAGNOSIS — F17200 Nicotine dependence, unspecified, uncomplicated: Secondary | ICD-10-CM | POA: Diagnosis present

## 2021-09-07 DIAGNOSIS — E11628 Type 2 diabetes mellitus with other skin complications: Secondary | ICD-10-CM | POA: Diagnosis present

## 2021-09-07 DIAGNOSIS — E11621 Type 2 diabetes mellitus with foot ulcer: Secondary | ICD-10-CM | POA: Diagnosis present

## 2021-09-07 DIAGNOSIS — E876 Hypokalemia: Secondary | ICD-10-CM | POA: Diagnosis not present

## 2021-09-07 DIAGNOSIS — T383X6A Underdosing of insulin and oral hypoglycemic [antidiabetic] drugs, initial encounter: Secondary | ICD-10-CM | POA: Diagnosis present

## 2021-09-07 DIAGNOSIS — Z4659 Encounter for fitting and adjustment of other gastrointestinal appliance and device: Secondary | ICD-10-CM

## 2021-09-07 DIAGNOSIS — L97519 Non-pressure chronic ulcer of other part of right foot with unspecified severity: Secondary | ICD-10-CM

## 2021-09-07 DIAGNOSIS — I25118 Atherosclerotic heart disease of native coronary artery with other forms of angina pectoris: Secondary | ICD-10-CM | POA: Diagnosis present

## 2021-09-07 DIAGNOSIS — R0600 Dyspnea, unspecified: Secondary | ICD-10-CM

## 2021-09-07 DIAGNOSIS — I480 Paroxysmal atrial fibrillation: Secondary | ICD-10-CM | POA: Diagnosis present

## 2021-09-07 DIAGNOSIS — E871 Hypo-osmolality and hyponatremia: Secondary | ICD-10-CM | POA: Diagnosis present

## 2021-09-07 LAB — URINALYSIS, ROUTINE W REFLEX MICROSCOPIC
Bacteria, UA: NONE SEEN
Bilirubin Urine: NEGATIVE
Glucose, UA: >=500 mg/dL — AB
Ketones, ur: 20 mg/dL — AB
Leukocytes,Ua: NEGATIVE
Nitrite: NEGATIVE
Protein, ur: NEGATIVE mg/dL
Specific Gravity, Urine: 1.023 (ref 1.005–1.030)
pH: 6 (ref 5.0–8.0)

## 2021-09-07 LAB — APTT: aPTT: 38 seconds — ABNORMAL HIGH (ref 24–36)

## 2021-09-07 LAB — LACTIC ACID, PLASMA
Lactic Acid, Venous: 2.9 mmol/L (ref 0.5–1.9)
Lactic Acid, Venous: 2.9 mmol/L (ref 0.5–1.9)
Lactic Acid, Venous: 2.8 mmol/L (ref 0.5–1.9)
Lactic Acid, Venous: 2.8 mmol/L (ref 0.5–1.9)

## 2021-09-07 LAB — COMPREHENSIVE METABOLIC PANEL
ALT: 23 U/L (ref 0–44)
AST: 33 U/L (ref 15–41)
Albumin: 2.6 g/dL — ABNORMAL LOW (ref 3.5–5.0)
Alkaline Phosphatase: 94 U/L (ref 38–126)
Anion gap: 14 (ref 5–15)
BUN: 18 mg/dL (ref 6–20)
CO2: 27 mmol/L (ref 22–32)
Calcium: 13.6 mg/dL (ref 8.9–10.3)
Chloride: 87 mmol/L — ABNORMAL LOW (ref 98–111)
Creatinine, Ser: 0.9 mg/dL (ref 0.61–1.24)
GFR, Estimated: >60 mL/min (ref >60)
Glucose, Bld: 317 mg/dL — ABNORMAL HIGH (ref 70–99)
Potassium: 4 mmol/L (ref 3.5–5.1)
Sodium: 128 mmol/L — ABNORMAL LOW (ref 135–145)
Total Bilirubin: 3.1 mg/dL — ABNORMAL HIGH (ref 0.3–1.2)
Total Protein: 6.4 g/dL — ABNORMAL LOW (ref 6.5–8.1)

## 2021-09-07 LAB — RESP PANEL BY RT-PCR (FLU A&B, COVID) ARPGX2
Influenza A by PCR: NEGATIVE
Influenza B by PCR: NEGATIVE
SARS Coronavirus 2 by RT PCR: NEGATIVE

## 2021-09-07 LAB — TROPONIN I (HIGH SENSITIVITY)
Troponin I (High Sensitivity): 5943 ng/L (ref <18)
Troponin I (High Sensitivity): 5878 ng/L (ref <18)
Troponin I (High Sensitivity): 4796 ng/L (ref <18)
Troponin I (High Sensitivity): 4189 ng/L (ref <18)

## 2021-09-07 LAB — CBC WITH DIFFERENTIAL/PLATELET
Abs Immature Granulocytes: 0.04 10*3/uL (ref 0.00–0.07)
Basophils Absolute: 0 10*3/uL (ref 0.0–0.1)
Basophils Relative: 0 %
Eosinophils Absolute: 0 10*3/uL (ref 0.0–0.5)
Eosinophils Relative: 0 %
HCT: 41.1 % (ref 39.0–52.0)
Hemoglobin: 14.4 g/dL (ref 13.0–17.0)
Immature Granulocytes: 0 %
Lymphocytes Relative: 3 %
Lymphs Abs: 0.3 10*3/uL — ABNORMAL LOW (ref 0.7–4.0)
MCH: 31 pg (ref 26.0–34.0)
MCHC: 35 g/dL (ref 30.0–36.0)
MCV: 88.6 fL (ref 80.0–100.0)
Monocytes Absolute: 0.7 10*3/uL (ref 0.1–1.0)
Monocytes Relative: 6 %
Neutro Abs: 10.6 10*3/uL — ABNORMAL HIGH (ref 1.7–7.7)
Neutrophils Relative %: 91 %
Platelets: 185 10*3/uL (ref 150–400)
RBC: 4.64 MIL/uL (ref 4.22–5.81)
RDW: 11.9 % (ref 11.5–15.5)
Smear Review: NORMAL
WBC: 11.2 10*3/uL — ABNORMAL HIGH (ref 4.0–10.5)
nRBC: 0 % (ref 0.0–0.2)

## 2021-09-07 LAB — SEDIMENTATION RATE: Sed Rate: 64 mm/hr — ABNORMAL HIGH (ref 0–20)

## 2021-09-07 LAB — HEPARIN LEVEL (UNFRACTIONATED): Heparin Unfractionated: <0.1 IU/mL — ABNORMAL LOW (ref 0.30–0.70)

## 2021-09-07 LAB — PROCALCITONIN: Procalcitonin: 15.8 ng/mL

## 2021-09-07 LAB — CBG MONITORING, ED
Glucose-Capillary: 309 mg/dL — ABNORMAL HIGH (ref 70–99)
Glucose-Capillary: 211 mg/dL — ABNORMAL HIGH (ref 70–99)

## 2021-09-07 LAB — PROTIME-INR
INR: 1.2 (ref 0.8–1.2)
Prothrombin Time: 15.6 seconds — ABNORMAL HIGH (ref 11.4–15.2)

## 2021-09-07 LAB — GLUCOSE, CAPILLARY: Glucose-Capillary: 140 mg/dL — ABNORMAL HIGH (ref 70–99)

## 2021-09-07 LAB — MAGNESIUM: Magnesium: 1 mg/dL — ABNORMAL LOW (ref 1.7–2.4)

## 2021-09-07 MED ORDER — ACETAMINOPHEN 325 MG PO TABS: 650.0000 mg | ORAL_TABLET | Freq: Four times a day (QID) | ORAL | Status: DC | PRN

## 2021-09-07 MED ORDER — PNEUMOCOCCAL VAC POLYVALENT 25 MCG/0.5ML IJ INJ
0.5000 mL | INJECTION | INTRAMUSCULAR | Status: AC
  Administered 2021-09-08: 0.5 mL via INTRAMUSCULAR
  Filled 2021-09-07: qty 0.5

## 2021-09-07 MED ORDER — HEPARIN BOLUS VIA INFUSION
4000.0000 [IU] | Freq: Once | INTRAVENOUS | Status: AC
Start: 2021-09-07 — End: 2021-09-07
  Administered 2021-09-07: 4000 [IU] via INTRAVENOUS
  Filled 2021-09-07: qty 4000

## 2021-09-07 MED ORDER — ASPIRIN 81 MG PO CHEW
324.0000 mg | CHEWABLE_TABLET | Freq: Once | ORAL | Status: AC
  Administered 2021-09-07: 324 mg via ORAL
  Filled 2021-09-07: qty 4

## 2021-09-07 MED ORDER — HEPARIN (PORCINE) 25000 UT/250ML-% IV SOLN
2750.0000 [IU]/h | INTRAVENOUS | Status: DC
  Administered 2021-09-07: 1200 [IU]/h via INTRAVENOUS
  Administered 2021-09-07: 21:00:00 1500 [IU]/h via INTRAVENOUS
  Administered 2021-09-08: 08:00:00 1800 [IU]/h via INTRAVENOUS
  Administered 2021-09-08: 2150 [IU]/h via INTRAVENOUS
  Administered 2021-09-09: 2750 [IU]/h via INTRAVENOUS
  Filled 2021-09-07 (×5): qty 250

## 2021-09-07 MED ORDER — HEPARIN BOLUS VIA INFUSION
2800.0000 [IU] | Freq: Once | INTRAVENOUS | Status: AC
  Administered 2021-09-07: 2800 [IU] via INTRAVENOUS
  Filled 2021-09-07: qty 2800

## 2021-09-07 MED ORDER — ONDANSETRON HCL 4 MG PO TABS: 4.0000 mg | ORAL_TABLET | Freq: Four times a day (QID) | ORAL | Status: DC | PRN

## 2021-09-07 MED ORDER — ACETAMINOPHEN 500 MG PO TABS
1000.0000 mg | ORAL_TABLET | Freq: Once | ORAL | Status: AC
  Administered 2021-09-07: 1000 mg via ORAL
  Filled 2021-09-07: qty 2

## 2021-09-07 MED ORDER — SODIUM CHLORIDE 0.9 % IV SOLN
2.0000 g | Freq: Three times a day (TID) | INTRAVENOUS | Status: DC
  Administered 2021-09-07 – 2021-09-09 (×8): 2 g via INTRAVENOUS
  Filled 2021-09-07 (×11): qty 2

## 2021-09-07 MED ORDER — INSULIN ASPART 100 UNIT/ML IJ SOLN
0.0000 [IU] | Freq: Three times a day (TID) | INTRAMUSCULAR | Status: DC
  Administered 2021-09-07: 11 [IU] via SUBCUTANEOUS
  Administered 2021-09-07: 17:00:00 5 [IU] via SUBCUTANEOUS
  Administered 2021-09-08: 09:00:00 2 [IU] via SUBCUTANEOUS
  Filled 2021-09-07 (×3): qty 1

## 2021-09-07 MED ORDER — FENTANYL CITRATE PF 50 MCG/ML IJ SOSY
50.0000 ug | PREFILLED_SYRINGE | Freq: Once | INTRAMUSCULAR | Status: AC
  Administered 2021-09-07: 50 ug via INTRAVENOUS
  Filled 2021-09-07: qty 1

## 2021-09-07 MED ORDER — OXYCODONE HCL 5 MG PO TABS
5.0000 mg | ORAL_TABLET | Freq: Four times a day (QID) | ORAL | Status: DC | PRN
  Administered 2021-09-07: 5 mg via ORAL
  Filled 2021-09-07: qty 1

## 2021-09-07 MED ORDER — LACTATED RINGERS IV SOLN: INTRAVENOUS | Status: AC

## 2021-09-07 MED ORDER — INSULIN DETEMIR 100 UNIT/ML ~~LOC~~ SOLN
20.0000 [IU] | Freq: Every day | SUBCUTANEOUS | Status: DC
  Administered 2021-09-07 – 2021-09-08 (×2): 20 [IU] via SUBCUTANEOUS
  Filled 2021-09-07 (×3): qty 0.2

## 2021-09-07 MED ORDER — SODIUM CHLORIDE 0.9 % IV SOLN
2.0000 g | Freq: Once | INTRAVENOUS | Status: AC
  Administered 2021-09-07: 2 g via INTRAVENOUS
  Filled 2021-09-07: qty 2

## 2021-09-07 MED ORDER — METOPROLOL TARTRATE 25 MG PO TABS
12.5000 mg | ORAL_TABLET | Freq: Two times a day (BID) | ORAL | Status: DC
  Administered 2021-09-08: 12.5 mg via ORAL
  Filled 2021-09-07 (×2): qty 1

## 2021-09-07 MED ORDER — VANCOMYCIN HCL IN DEXTROSE 1-5 GM/200ML-% IV SOLN: 1000.0000 mg | Freq: Once | INTRAVENOUS | Status: DC

## 2021-09-07 MED ORDER — LACTATED RINGERS IV BOLUS (SEPSIS)
1000.0000 mL | Freq: Once | INTRAVENOUS | Status: AC
  Administered 2021-09-07: 1000 mL via INTRAVENOUS

## 2021-09-07 MED ORDER — METRONIDAZOLE 500 MG/100ML IV SOLN
500.0000 mg | Freq: Once | INTRAVENOUS | Status: AC
  Administered 2021-09-07: 500 mg via INTRAVENOUS
  Filled 2021-09-07: qty 100

## 2021-09-07 MED ORDER — VANCOMYCIN HCL 2000 MG/400ML IV SOLN
2000.0000 mg | Freq: Once | INTRAVENOUS | Status: AC
  Administered 2021-09-07: 2000 mg via INTRAVENOUS
  Filled 2021-09-07: qty 400

## 2021-09-07 MED ORDER — CINACALCET HCL 30 MG PO TABS
30.0000 mg | ORAL_TABLET | Freq: Every day | ORAL | Status: DC
  Administered 2021-09-07: 17:00:00 30 mg via ORAL
  Filled 2021-09-07 (×7): qty 1

## 2021-09-07 MED ORDER — ONDANSETRON HCL 4 MG/2ML IJ SOLN: 4.0000 mg | Freq: Four times a day (QID) | INTRAMUSCULAR | Status: DC | PRN

## 2021-09-07 MED ORDER — ASPIRIN EC 81 MG PO TBEC
81.0000 mg | DELAYED_RELEASE_TABLET | Freq: Every day | ORAL | Status: DC
  Administered 2021-09-08 – 2021-09-10 (×2): 81 mg via ORAL
  Filled 2021-09-07 (×4): qty 1

## 2021-09-07 MED ORDER — ONDANSETRON HCL 4 MG/2ML IJ SOLN
4.0000 mg | Freq: Once | INTRAMUSCULAR | Status: AC
  Administered 2021-09-07: 4 mg via INTRAVENOUS
  Filled 2021-09-07: qty 2

## 2021-09-07 MED ORDER — INFLUENZA VAC SPLIT QUAD 0.5 ML IM SUSY
0.5000 mL | PREFILLED_SYRINGE | INTRAMUSCULAR | Status: AC
  Administered 2021-09-08: 0.5 mL via INTRAMUSCULAR
  Filled 2021-09-07: qty 0.5

## 2021-09-07 MED ORDER — VANCOMYCIN HCL 1500 MG/300ML IV SOLN
1500.0000 mg | Freq: Two times a day (BID) | INTRAVENOUS | Status: DC
  Administered 2021-09-07 – 2021-09-08 (×2): 1500 mg via INTRAVENOUS
  Filled 2021-09-07 (×4): qty 300

## 2021-09-07 MED ORDER — METRONIDAZOLE 500 MG/100ML IV SOLN
500.0000 mg | Freq: Two times a day (BID) | INTRAVENOUS | Status: DC
  Administered 2021-09-07 – 2021-09-09 (×4): 500 mg via INTRAVENOUS
  Filled 2021-09-07 (×6): qty 100

## 2021-09-07 MED ORDER — ATORVASTATIN CALCIUM 20 MG PO TABS
40.0000 mg | ORAL_TABLET | Freq: Every evening | ORAL | Status: DC
  Administered 2021-09-07: 17:00:00 40 mg via ORAL
  Filled 2021-09-07: qty 2

## 2021-09-07 NOTE — Progress Notes (Signed)
[facility] following for sepsis protocol. 

## 2021-09-07 NOTE — TOC Initial Note (Addendum)
Transition of Care [facility]) - Initial/Assessment Note    Patient Details  Name: [name] MRN: [id number] Date of Birth: 06-Jul-1966  [facility]) CM/SW Contact:    Alberteen Sam, LCSW Phone Number: 09/08/2021, 10:48 AM  Clinical Narrative:                  CSW notes podiatry following for potential surgical intervention of right foot.   Per last admission August 2022 at discharge patient [facility] and [facility]ynn Sayers with [facility].   Patient has 3 n 1 and walker at home. Wheelchair was ordered via [facility] from last admission.   At time of discharge medications can be sent to medication management clinic to provide dc meds at no cost to patient.   TOC will follow for potential discharge planning needs.     Expected Discharge Plan: [place] Barriers to Discharge: Continued Medical Work up   Patient Goals and CMS Choice   CMS Medicare.gov Compare Post Acute Care list provided to:: Patient Choice offered to / list presented to : Patient  Expected Discharge Plan and Services Expected Discharge Plan: [place]                                              Prior Living Arrangements/Services   Lives with:: Self                   Activities of Daily Living      Permission Sought/Granted                  Emotional Assessment       Orientation: : Oriented to Self, Oriented to Place, Oriented to  Time, Oriented to Situation Alcohol / Substance Use: Not Applicable Psych Involvement: No (comment)  Admission diagnosis:  Sepsis ([place]) [A41.9] Patient Active Problem List   Diagnosis Date Noted   NSTEMI (non-ST elevated myocardial infarction) ([place]) 09/11/2021   Nicotine dependence 09/24/2021   Alcohol dependence ([place]) 09/04/2021   Diabetic foot ulcer ([place]) 09/28/2021   Hyponatremia 09/06/2021   Sepsis ([place]) 05/22/2021   Foot  osteomyelitis, right ([place]) 05/09/2021   Hypercalcemia 07/10/2020   Diabetes mellitus with hyperglycemia ([place]) 07/09/2020   HTN (hypertension) 07/09/2020   Osteomyelitis of great toe of right foot ([place]) 10/30/2018   Dog bite 04/13/2018   PCP:  Pcp, No Pharmacy:   [id number] [place], [address] [place] [address] Phone: [phone] Fax: [phone]  [facility] [address], [place] [address] Phone: [phone] Fax: [phone]     Social Determinants of Health (SDOH) Interventions    Readmission Risk Interventions No flowsheet data found.

## 2021-09-07 NOTE — ED Notes (Signed)
Patient given warm blanket and resting comfortably at this time. No needs expressed to RN.

## 2021-09-07 NOTE — ED Provider Notes (Addendum)
[facility] Emergency Department Provider Note  ____________________________________________   Event Date/Time   First MD Initiated Contact with Patient 09/24/2021 [phone]     (approximate)  I have reviewed the triage vital signs and the nursing notes.   HISTORY  Chief Complaint No chief complaint on file.    HPI [name] is a 55 y.o. male with history of hypertension, diabetes, dyslipidemia who presents to the emergency department with complaints of feeling tired over the past day.  Patient had an abscess and had a transmetatarsal amputation of the right foot by Dr. Vickki Muff on 05/14/2021.  He reports over the past 2 to 3 weeks he developed a wound to the lateral aspect of his foot and has had foul-smelling drainage.  He states he thinks he has been on antibiotics but is unable to tell me the name of the antibiotics or when he finished them.  Patient is a very poor historian.  He denies any known fevers.  He denies any headache, neck pain or neck stiffness, chest pain or shortness of breath, abdominal pain, vomiting or diarrhea, dysuria or hematuria.     On review of his records, it appears he was last seen by podiatry on 07/09/2021 and at that time it was documented that he had a wound to the lateral aspect of his foot at the base of the fifth metatarsal.  At that time it was a 2 x 2 full-thickness ulcer without signs of infection per podiatry note.  Past Medical History:  Diagnosis Date   Diabetes [facility])    Hypertension     Patient Active Problem List   Diagnosis Date Noted   Sepsis ([place]) 05/22/2021   Foot osteomyelitis, right ([place]) 05/09/2021   Hypercalcemia 07/10/2020   Type 2 diabetes mellitus with complication, without long-term current use of insulin ([place]) 07/09/2020   HTN (hypertension) 07/09/2020   Osteomyelitis of great toe of right foot ([place]) 10/30/2018   Dog bite 04/13/2018    Past Surgical History:  Procedure Laterality Date   AMPUTATION  TOE Right 07/11/2020   Procedure: AMPUTATION TOE-Right Great Toe;  Surgeon: Samara Deist, DPM;  Location: [facility] ORS;  Service: Podiatry;  Laterality: Right;   I & D EXTREMITY Right 05/14/2021   Procedure: IRRIGATION AND DEBRIDEMENT EXTREMITY- RIGHT FOOT;  Surgeon: Samara Deist, DPM;  Location: [facility] ORS;  Service: Podiatry;  Laterality: Right;   IRRIGATION AND DEBRIDEMENT ABSCESS Right 10/31/2018   Procedure: IRRIGATION AND DEBRIDEMENT ABSCESS;  Surgeon: Samara Deist, DPM;  Location: [facility] ORS;  Service: Podiatry;  Laterality: Right;   IRRIGATION AND DEBRIDEMENT FOOT Right 11/02/2018   Procedure: IRRIGATION AND DEBRIDEMENT FOOT;  Surgeon: Sharlotte Alamo, DPM;  Location: [facility] ORS;  Service: Podiatry;  Laterality: Right;   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   Procedure: TRANSMETATARSAL AMPUTATION;  Surgeon: Samara Deist, DPM;  Location: [facility] ORS;  Service: Podiatry;  Laterality: Right;    Prior to Admission medications   Medication Sig Start Date End Date Taking? Authorizing Provider  acetaminophen (TYLENOL) 325 MG tablet Take 2 tablets (650 mg total) by mouth every 6 (six) hours as needed for mild pain (or Fever >/= 101). 05/22/21   Arrien, Jimmy Picket, MD  amoxicillin-clavulanate (AUGMENTIN) 875-125 MG tablet Take 1 tablet by mouth 2 (two) times daily  for 14 days. 06/13/21   Tsosie Billing, MD  atorvastatin (LIPITOR) 40 MG tablet Take 40 mg by mouth daily.    [provider]  glipiZIDE (GLUCOTROL XL) 10 MG 24 hr tablet Take  20 mg by mouth daily with breakfast.    [provider]  oxyCODONE (OXY IR/ROXICODONE) 5 MG immediate release tablet Take 1 tablet (5 mg total) by mouth every 6 (six) hours as needed for moderate pain. Patient not taking: Reported on 06/13/2021 05/22/21   Arrien, Jimmy Picket, MD    Allergies Patient has no known allergies.  Family History  Problem Relation Age of Onset   CAD Father    CAD Brother     Social History Social History    Tobacco Use   Smoking status: Light Smoker    Types: Cigars   Smokeless tobacco: Never  Vaping Use   Vaping Use: Never used  Substance Use Topics   Alcohol use: Yes    Comment: "too much"    Drug use: Never    Review of Systems Constitutional: No fever. Eyes: No visual changes. ENT: No sore throat. Cardiovascular: Denies chest pain. Respiratory: Denies shortness of breath. Gastrointestinal: No nausea, vomiting, diarrhea. Genitourinary: Negative for dysuria. Musculoskeletal: Negative for back pain. Skin: Negative for rash. Neurological: Negative for focal weakness or numbness.  ____________________________________________   PHYSICAL EXAM:  VITAL SIGNS: ED Triage Vitals  Enc Vitals Group     BP 09/28/2021 0647 132/86     Pulse Rate 08/31/2021 0647 (!) 140     Resp 09/12/2021 0647 (!) 22     Temp 09/01/2021 0647 98.3 F (36.8 C)     Temp Source 09/19/2021 0647 Oral     SpO2 09/19/2021 0647 97 %     Weight 09/20/2021 0648 205 lb (93 kg)     Height 09/27/2021 0648 6\' 3"  (1.905 m)     Head Circumference --      Peak Flow --      Pain Score 09/02/2021 0653 10     Pain Loc --      Pain Edu? --      Excl. in [place]? --    CONSTITUTIONAL: Alert and oriented and responds appropriately to questions.  Chronically ill-appearing HEAD: Normocephalic EYES: Conjunctivae clear, pupils appear equal, EOM appear intact ENT: normal nose; moist mucous membranes NECK: Supple, normal ROM CARD: Regular and tachycardic; S1 and S2 appreciated; no murmurs, no clicks, no rubs, no gallops RESP: Normal chest excursion without splinting, mild tachypnea, breath sounds clear and equal bilaterally; no wheezes, no rhonchi, no rales, no hypoxia or respiratory distress, speaking full sentences ABD/GI: Normal bowel sounds; non-distended; soft, non-tender, no rebound, no guarding, no peritoneal signs, no hepatosplenomegaly BACK: The back appears normal EXT: Patient is status post transmetatarsal amputation of the right  foot.  He has an approximately 3 x 3 cm area of ulceration with surrounding redness, warmth and induration to the right lateral foot.  There is foul-smelling purulent drainage coming from this wound.  He has a 2+ DP pulse in this foot.  He has mild nonpitting edema noted in the lower extremity but no calf tenderness, calf swelling.  Compartments are soft. SKIN: Normal color for age and race; warm; no rash on exposed skin NEURO: Moves all extremities equally PSYCH: The patient's mood and manner are appropriate.    RIGHT foot   Patient gave verbal permission to utilize photo for medical documentation only. The image was not stored on any personal device.  ____________________________________________   LABS (all labs ordered are listed, but only abnormal results are displayed)  Labs Reviewed  LACTIC ACID, PLASMA - Abnormal; Notable for the following components:      Result Value  Lactic Acid, Venous 2.9 (*)    All other components within normal limits  CBC WITH DIFFERENTIAL/PLATELET - Abnormal; Notable for the following components:   WBC 11.2 (*)    Neutro Abs 10.6 (*)    Lymphs Abs 0.3 (*)    All other components within normal limits  COMPREHENSIVE METABOLIC PANEL - Abnormal; Notable for the following components:   Sodium 128 (*)    Chloride 87 (*)    Glucose, Bld 317 (*)    Calcium 13.6 (*)    Total Protein 6.4 (*)    Albumin 2.6 (*)    Total Bilirubin 3.1 (*)    All other components within normal limits  TROPONIN I (HIGH SENSITIVITY) - Abnormal; Notable for the following components:   Troponin I (High Sensitivity) 5,943 (*)    All other components within normal limits  CULTURE, BLOOD (ROUTINE X 2)  CULTURE, BLOOD (ROUTINE X 2)  URINE CULTURE  RESP PANEL BY RT-PCR (FLU A&B, COVID) ARPGX2  AEROBIC/ANAEROBIC CULTURE W GRAM STAIN (SURGICAL/DEEP WOUND)  PROCALCITONIN  LACTIC ACID, PLASMA  URINALYSIS, ROUTINE W REFLEX MICROSCOPIC  MAGNESIUM    ____________________________________________  EKG   EKG Interpretation  Date/Time:  Saturday September 07 2021 06:44:17 EST Ventricular Rate:  149 PR Interval:    QRS Duration: 105 QT Interval:  298 QTC Calculation: 470 R Axis:   61 Text Interpretation: Atrial fibrillation versus sinus tach with PACs Lateral infarct, old Probable anteroseptal infarct, recent No old tracing to compare Confirmed by Hoby Kawai, Cyril Mourning [phone]) on 09/02/2021 7:53:38 AM         EKG Interpretation  Date/Time:  Saturday September 07 2021 08:10:02 EST Ventricular Rate:  95 PR Interval:  156 QRS Duration: 105 QT Interval:  364 QTC Calculation: 458 R Axis:   73 Text Interpretation: Sinus rhythm Ventricular premature complex Probable left atrial enlargement Probable lateral infarct, old Probable anteroseptal infarct, recent Confirmed by Pryor Curia [phone]) on 09/06/2021 8:19:10 AM        ____________________________________________  RADIOLOGY Jessie Foot Hailynn Slovacek, personally viewed and evaluated these images (plain radiographs) as part of my medical decision making, as well as reviewing the written report by the radiologist.  ED MD interpretation: Chest x-ray shows no acute abnormality.  X-ray of the foot shows possible postsurgical changes versus osteomyelitis.  Official radiology report(s): DG Chest 1 View  Result Date: 09/08/2021 CLINICAL DATA:  Sepsis. EXAM: CHEST  1 VIEW COMPARISON:  None. FINDINGS: The heart size and mediastinal contours are within normal limits. Both lungs are clear. The visualized skeletal structures are unremarkable. IMPRESSION: No active disease. Electronically Signed   By: Marijo Conception M.D.   On: 09/11/2021 07:50   DG Foot Complete Right  Result Date: 09/27/2021 CLINICAL DATA:  Right foot infection.  Open wound. EXAM: RIGHT FOOT COMPLETE - 3+ VIEW COMPARISON:  May 09, 2021. FINDINGS: Status post transmetatarsal amputation of the foot. Large soft tissue ulceration is  seen overlying the residual portion of the proximal fifth metatarsal. There is irregularity involving this bone, but it is uncertain if it represents postsurgical change or possibly osteomyelitis. IMPRESSION: Large soft tissue ulceration is seen overlying the residual portion of proximal fifth metatarsal. There is noted irregularity involving this bone, but it is uncertain if it represents postsurgical change or possibly osteomyelitis. Electronically Signed   By: Marijo Conception M.D.   On: 09/27/2021 07:53    ____________________________________________   PROCEDURES  Procedure(s) performed (including Critical Care):  Procedures  CRITICAL CARE Performed by:  Sebron Mcmahill   Total critical care time: 65 minutes  Critical care time was exclusive of separately billable procedures and treating other patients.  Critical care was necessary to treat or prevent imminent or life-threatening deterioration.  Critical care was time spent personally by me on the following activities: development of treatment plan with patient and/or surrogate as well as nursing, discussions with consultants, evaluation of patient's response to treatment, examination of patient, obtaining history from patient or surrogate, ordering and performing treatments and interventions, ordering and review of laboratory studies, ordering and review of radiographic studies, pulse oximetry and re-evaluation of patient's condition.  ____________________________________________   INITIAL IMPRESSION / ASSESSMENT AND PLAN / ED COURSE  As part of my medical decision making, I reviewed the following data within the [facility] notes reviewed and incorporated, Labs reviewed , EKG interpreted , Old chart reviewed, Radiograph reviewed , Discussed with admitting physician , A consult was requested and obtained from this/these consultant(s) podiatry, and Notes from prior ED visits         Patient here with  complaints of feeling tired.  He also has a large wound to his right foot with surrounding cellulitis and purulent drainage.  He is tachycardic and tachypneic here but afebrile.  I am concerned for possible sepsis.  We will give 30 mL/kg IV fluid bolus and start broad-spectrum antibiotics.  We will send blood cultures and a wound culture of the foot.  Will obtain chest x-ray, foot x-ray to evaluate for osteomyelitis.  Will give pain medication.  EKG shows sinus tachycardia with PACs versus atrial fibrillation.  We will continue to closely monitor after IV fluids.  ED PROGRESS  Patient's heart rate has improved.  His troponin has come back elevated at almost 6000.  He again denies any chest pain or shortness of breath.  This could be from demand ischemia.  Will give aspirin.  Will likely need to be started on heparin as well.  X-ray of the foot concerning for surgical changes versus osteomyelitis.  We will consult Dr. Vickki Muff on-call for podiatry.  Lactic minimally elevated.  He is receiving IV fluids.  Will recheck after fluid bolus.  Labs also show a hypercalcemia which is chronic for patient.  He is receiving IV fluids.  Will discuss with medicine for admission for sepsis due to right foot wound, cellulitis and possible osteomyelitis.   8:18 AM Discussed patient's case with hospitalist, Dr. Francine Graven.  I have recommended admission and patient (and family if present) agree with this plan. Admitting physician will place admission orders.   I reviewed all nursing notes, vitals, pertinent previous records and reviewed/interpreted all EKGs, lab and urine results, imaging (as available).   Patient's repeat EKG shows a sinus rhythm with a rate of 95 and no ischemia.  He still denying chest pain or shortness of breath.  Discussed this with hospitalist and given his troponin is quite elevated would like to start heparin at this time.  He has received a full dose aspirin.   8:54 AM  Secure chat message  sent to Dr. Vickki Muff with podiatry. ____________________________________________   FINAL CLINICAL IMPRESSION(S) / ED DIAGNOSES  Final diagnoses:  Ulcer of right foot, unspecified ulcer stage ([place])  Acute sepsis ([place])  NSTEMI (non-ST elevated myocardial infarction) [facility])     ED Discharge Orders     None       *Please note:  [name] was evaluated in Emergency Department on 09/26/2021 for the symptoms described in  the history of present illness. He was evaluated in the context of the global COVID-19 pandemic, which necessitated consideration that the patient might be at risk for infection with the SARS-CoV-2 virus that causes COVID-19. Institutional protocols and algorithms that pertain to the evaluation of patients at risk for COVID-19 are in a state of rapid change based on information released by regulatory bodies including the CDC and federal and state organizations. These policies and algorithms were followed during the patient's care in the ED.  Some ED evaluations and interventions may be delayed as a result of limited staffing during and the pandemic.*   Note:  This document was prepared using Dragon voice recognition software and may include unintentional dictation errors.    Shaquanta Harkless, Delice Bison, DO 09/27/2021 [place], Delice Bison, DO 08/29/2021 [phone]

## 2021-09-07 NOTE — ED Notes (Signed)
Lab called reported needing blue culture swab as well to perform full aerobic and anaerobic culture on foot wound.

## 2021-09-07 NOTE — Consult Note (Signed)
ANTICOAGULATION CONSULT NOTE - Initial Consult  Pharmacy Consult for Heparin infusion Indication: chest pain/ACS  No Known Allergies  Patient Measurements: Height: 6\' 3"  (190.5 cm) Weight: 93 kg (205 lb) IBW/kg (Calculated) : 84.5 Heparin Dosing Weight: 93 kg  Vital Signs: Temp: 97.8 F (36.6 C) (12/10 0734) Temp Source: Rectal (12/10 0734) BP: 97/70 (12/10 0900) Pulse Rate: 137 (12/10 0900)  Labs: Recent Labs    08/31/2021 0658 09/17/2021 0912  HGB 14.4  --   HCT 41.1  --   PLT 185  --   CREATININE 0.90  --   TROPONINIHS 5,943* 5,878*    Estimated Creatinine Clearance: 110.8 mL/min (by C-G formula based on SCr of 0.9 mg/dL).   Medical History: Past Medical History:  Diagnosis Date   Diabetes ([place])    Hypertension     Medications:  No prior history of chronic anticoagulation PTA medications  Assessment: Pharmacy has been consulted to initiate heparin infusion in 55yo patient presenting to the ED with right foot pain. Patient denies any chest pain but has troponin levels of 5,943 and 5,878, respectively. Likely demand ischemia d/t sepsis. Baseline labs have been ordered and are pending.  Goal of Therapy:  Heparin level 0.3-0.7 units/ml Monitor platelets by anticoagulation protocol: Yes   Plan:  Give 4000 units bolus x 1 Start heparin infusion at 1200 units/hr Check anti-Xa level in 6 hours and daily while on heparin Continue to monitor H&H and platelets  Craigory Toste A Dracen Reigle 09/28/2021,10:25 AM

## 2021-09-07 NOTE — ED Triage Notes (Signed)
Patient presents for infection/pain left foot via [facility]

## 2021-09-07 NOTE — H&P (Addendum)
History and Physical    [name] IOE:[id number] DOB: [birth year] DOA: 09/03/2021  PCP: Pcp, No   Patient coming from: Home  I have personally briefly reviewed patient's old medical records in [place]  Chief Complaint: Rt Foot pain  HPI: [name] is a 55 y.o. 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 Patient states that postsurgery he followed-up with the podiatrist once but has not gone back since then.  Chart review showed he followed up with the podiatrist on 07/09/2021 and at that time he had a 2 x 2 full-thickness ulcer without signs of infection. He denies having any fever, no chills, no chest pain, no shortness of breath, no nausea, no vomiting, no dizziness, no lightheadedness, no headache, no abdominal pain, no changes in his bowel habits, no urinary symptoms, no leg swelling, no palpitations, no diaphoresis, no blurred vision, no focal deficit. Sodium 128, potassium 4.0, chloride 87, bicarb 27, glucose 317, BUN 18, creatinine 0.90, calcium 13.6, alkaline phosphatase 94, albumin 2.6, AST 33, ALT 23, total protein 6.4, total bili 3.1, troponin 5943, lactic acid 2.9, procalcitonin 15.8, white count 11.2, hemoglobin 14.4, hematocrit 41.1, MCV 88.6, RDW 11.9, platelet count 185 Respiratory viral panel is negative Chest x-ray reviewed by me shows no evidence of acute cardiopulmonary disease. Right foot x-ray shows large soft tissue ulceration seen overlying the residual portion of the proximal fifth metatarsal.  There is noted irregularity involving the bone but it is uncertain if it represents postsurgical change of possible osteomyelitis. 12-lead EKG reviewed by me shows sinus tachycardia with PACs  ED Course: Patient is a 55 year old  male who presents to the emergency room for evaluation of painful ulcer over the lateral portion of the right foot with increased purulent drainage. On arrival to the ER he was tachycardic, tachypneic, has elevated lactic acid level and procalcitonin levels. He received sepsis IV fluid bolus in the ER and empiric antibiotic therapy with cefepime, Flagyl and vancomycin. Patient was also started on a heparin drip for possible non-ST elevation MI. He will be admitted to the hospital for further evaluation   Review of Systems: As per HPI otherwise all other systems reviewed and negative.    Past Medical History:  Diagnosis Date   Diabetes [facility])    Hypertension     Past Surgical History:  Procedure Laterality Date   AMPUTATION TOE Right 07/11/2020   Procedure: AMPUTATION TOE-Right Great Toe;  Surgeon: Samara Deist, DPM;  Location: [facility] ORS;  Service: Podiatry;  Laterality: Right;   I & D EXTREMITY Right 05/14/2021   Procedure: IRRIGATION AND DEBRIDEMENT EXTREMITY- RIGHT FOOT;  Surgeon: Samara Deist, DPM;  Location: [facility] ORS;  Service: Podiatry;  Laterality: Right;   IRRIGATION AND DEBRIDEMENT ABSCESS Right 10/31/2018   Procedure: IRRIGATION AND DEBRIDEMENT ABSCESS;  Surgeon: Samara Deist, DPM;  Location: [facility] ORS;  Service: Podiatry;  Laterality: Right;   IRRIGATION AND DEBRIDEMENT FOOT Right 11/02/2018   Procedure: IRRIGATION AND DEBRIDEMENT FOOT;  Surgeon: Sharlotte Alamo, DPM;  Location: [facility] ORS;  Service: Podiatry;  Laterality: Right;   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   Procedure: TRANSMETATARSAL AMPUTATION;  Surgeon: Samara Deist, DPM;  Location: [facility] ORS;  Service: Podiatry;  Laterality: Right;     reports that he has been smoking cigars. He has  never used smokeless tobacco. He reports current alcohol use. He reports that he does not use drugs.  No Known Allergies  Family History  Problem Relation Age of Onset   CAD Father    CAD Brother       Prior to Admission  medications   Medication Sig Start Date End Date Taking? Authorizing Provider  acetaminophen (TYLENOL) 325 MG tablet Take 2 tablets (650 mg total) by mouth every 6 (six) hours as needed for mild pain (or Fever >/= 101). 05/22/21  Yes Arrien, Jimmy Picket, MD  oxyCODONE (OXY IR/ROXICODONE) 5 MG immediate release tablet Take 1 tablet (5 mg total) by mouth every 6 (six) hours as needed for moderate pain. 05/22/21  Yes Arrien, Jimmy Picket, MD  amoxicillin-clavulanate (AUGMENTIN) 875-125 MG tablet Take 1 tablet by mouth 2 (two) times daily  for 14 days. Patient not taking: Reported on 09/14/2021 06/13/21   Tsosie Billing, MD  atorvastatin (LIPITOR) 40 MG tablet Take 40 mg by mouth daily.    [provider]  glipiZIDE (GLUCOTROL XL) 10 MG 24 hr tablet Take 20 mg by mouth daily with breakfast. Patient not taking: Reported on 09/08/2021    [provider]    Physical Exam: Vitals:   09/11/2021 0734 09/16/2021 0745 09/18/2021 0830 09/05/2021 0900  BP:  114/78 108/73 97/70  Pulse:  100 92 (!) 137  Resp:  18 (!) 21 15  Temp: 97.8 F (36.6 C)     TempSrc: Rectal     SpO2:  98% 99% 98%  Weight:      Height:         Vitals:   09/27/2021 0734 09/11/2021 0745 09/28/2021 0830 09/26/2021 0900  BP:  114/78 108/73 97/70  Pulse:  100 92 (!) 137  Resp:  18 (!) 21 15  Temp: 97.8 F (36.6 C)     TempSrc: Rectal     SpO2:  98% 99% 98%  Weight:      Height:          Constitutional: Alert and oriented x 3 . Not in any apparent distress HEENT:      Head: Normocephalic and atraumatic.         Eyes: PERLA, EOMI, Conjunctivae are normal. Sclera is non-icteric.       Mouth/Throat: Mucous membranes are moist.       Neck: Supple with no signs of meningismus. Cardiovascular: Tachycardic. No murmurs, gallops, or rubs. 2+ symmetrical distal pulses are present . No JVD.  Swelling over right lower extremity Respiratory: Respiratory effort normal .Lungs sounds clear bilaterally. No wheezes,  crackles, or rhonchi.  Gastrointestinal: Soft, non tender, and non distended with positive bowel sounds.  Genitourinary: No CVA tenderness. Musculoskeletal: Redness and swelling over right foot.  Ulcerated area over the lateral portion of the right foot with purulent drainage  Neurologic:  Face is symmetric. Moving all extremities. No gross focal neurologic deficits . Skin: Skin is warm, dry.  Right TMA stump with surrounding redness and swelling.  Ulcerated area on the lateral portion of the right foot. Psychiatric: Mood and affect are normal    Labs on Admission: I have personally reviewed following labs and imaging studies  CBC: Recent Labs  Lab 09/23/2021 0658  WBC 11.2*  NEUTROABS 10.6*  HGB 14.4  HCT 41.1  MCV 88.6  PLT 169   Basic Metabolic Panel: Recent Labs  Lab 09/10/2021 0658  NA 128*  K 4.0  CL 87*  CO2 27  GLUCOSE 317*  BUN  18  CREATININE 0.90  CALCIUM 13.6*   GFR: Estimated Creatinine Clearance: 110.8 mL/min (by C-G formula based on SCr of 0.9 mg/dL). Liver Function Tests: Recent Labs  Lab 09/11/2021 0658  AST 33  ALT 23  ALKPHOS 94  BILITOT 3.1*  PROT 6.4*  ALBUMIN 2.6*   No results for input(s): LIPASE, AMYLASE in the last 168 hours. No results for input(s): AMMONIA in the last 168 hours. Coagulation Profile: No results for input(s): INR, PROTIME in the last 168 hours. Cardiac Enzymes: No results for input(s): CKTOTAL, CKMB, CKMBINDEX, TROPONINI in the last 168 hours. BNP (last 3 results) No results for input(s): PROBNP in the last 8760 hours. HbA1C: No results for input(s): HGBA1C in the last 72 hours. CBG: No results for input(s): GLUCAP in the last 168 hours. Lipid Profile: No results for input(s): CHOL, HDL, LDLCALC, TRIG, CHOLHDL, LDLDIRECT in the last 72 hours. Thyroid Function Tests: No results for input(s): TSH, T4TOTAL, FREET4, T3FREE, THYROIDAB in the last 72 hours. Anemia Panel: No results for input(s): VITAMINB12, FOLATE,  FERRITIN, TIBC, IRON, RETICCTPCT in the last 72 hours. Urine analysis:    Component Value Date/Time   COLORURINE YELLOW (A) 05/09/2021 1425   APPEARANCEUR CLEAR (A) 05/09/2021 1425   LABSPEC 1.014 05/09/2021 1425   PHURINE 5.0 05/09/2021 1425   GLUCOSEU NEGATIVE 05/09/2021 1425   HGBUR NEGATIVE 05/09/2021 [place] 05/09/2021 [place] 05/09/2021 1425   PROTEINUR NEGATIVE 05/09/2021 1425   NITRITE NEGATIVE 05/09/2021 1425   LEUKOCYTESUR NEGATIVE 05/09/2021 1425    Radiological Exams on Admission: DG Chest 1 View  Result Date: 09/19/2021 CLINICAL DATA:  Sepsis. EXAM: CHEST  1 VIEW COMPARISON:  None. FINDINGS: The heart size and mediastinal contours are within normal limits. Both lungs are clear. The visualized skeletal structures are unremarkable. IMPRESSION: No active disease. Electronically Signed   By: Marijo Conception M.D.   On: 09/02/2021 07:50   DG Foot Complete Right  Result Date: 09/11/2021 CLINICAL DATA:  Right foot infection.  Open wound. EXAM: RIGHT FOOT COMPLETE - 3+ VIEW COMPARISON:  May 09, 2021. FINDINGS: Status post transmetatarsal amputation of the foot. Large soft tissue ulceration is seen overlying the residual portion of the proximal fifth metatarsal. There is irregularity involving this bone, but it is uncertain if it represents postsurgical change or possibly osteomyelitis. IMPRESSION: Large soft tissue ulceration is seen overlying the residual portion of proximal fifth metatarsal. There is noted irregularity involving this bone, but it is uncertain if it represents postsurgical change or possibly osteomyelitis. Electronically Signed   By: Marijo Conception M.D.   On: 09/11/2021 07:53     Assessment/Plan Principal Problem:   Sepsis ([place]) Active Problems:   Diabetes mellitus with hyperglycemia ([place])   Hypercalcemia   NSTEMI (non-ST elevated myocardial infarction) ([place])   Nicotine dependence   Alcohol dependence ([place])    Diabetic foot ulcer ([place])   Hyponatremia     Patient is a 55 year old male admitted to the hospital for sepsis from an infected diabetic foot ulcer rule out osteomyelitis.     Sepsis ([place]) As evidenced by tachycardia, tachypnea, leukocytosis, lactic acidosis, ulcerated area on the lateral portion of the right foot with purulent drainage and imaging concerning for osteomyelitis. Aggressive IV fluid resuscitation Trend lactic acid levels Place patient on empiric antibiotic therapy with vancomycin, cefepime and metronidazole Follow-up results of blood cultures Consult podiatry    Acute non-ST elevation MI Probably secondary to demand ischemia from tachycardia  related to sepsis Patient is asymptomatic and denies having any chest pain or shortness of breath. His cardiac risk factors include diabetes mellitus, male sex, nicotine dependence and strong family history of coronary artery disease We will cycle cardiac enzymes Continue heparin drip initiated in the ER Continue aspirin, statins and low-dose beta-blockers Obtain 2D echocardiogram to assess LVEF and rule out regional wall motion abnormality We will consult cardiology     Diabetes mellitus with complications of hyperglycemia Hold glipizide Obtain hemoglobin A1c levels Start patient on Levemir 20 units daily to optimize glycemic control. Glycemic control with sliding scale insulin     Diabetic foot ulcer rule out osteomyelitis/PAD Patient is status post right transmetatarsal amputation and has an ulcer on the lateral portion of the right foot with purulent drainage concerning for possible osteomyelitis. Continue empiric antibiotic therapy with vancomycin, Flagyl and cefepime Continue aspirin and statins Consult podiatry Patient will likely require an MRI to rule out osteomyelitis but will defer to podiatry for further work-up    Hypercalcemia Unclear etiology Patient noted to have elevated PTH levels (240 in  08/22) Concerning for possible primary hyperparathyroidism Repeat PTH levels Continue aggressive IV fluid resuscitation Repeat calcium levels in a.m. Consult nephrology     Nicotine dependence Smoking cessation has been discussed with patient He declines a nicotine transdermal patch at this time    Hyponatremia Most likely secondary to hyperglycemia Expect improvement in patient's serum sodium levels with resolution of hyperglycemia Continue IV fluid hydration   DVT prophylaxis: Heparin  Code Status: full code  Family Communication: Greater than 50% of time was spent discussing patient's condition and plan of care with him at the bedside.  All questions and concerns have been addressed.  He verbalizes understanding and agrees with the plan. Disposition Plan: Back to previous home environment Consults called: Podiatry/cardiology/nephrology Status:At the time of admission, it appears that the appropriate admission status for this patient is inpatient. This is judged to be reasonable and necessary to provide the required intensity of service to ensure the patient's safety given the presenting symptoms, physical exam findings, and initial radiographic and laboratory data in the context of their comorbid conditions. Patient requires inpatient status due to high intensity of service, high risk for further deterioration and high frequency of surveillance required.     Collier Bullock MD [facility]     09/22/2021, 9:43 AM

## 2021-09-07 NOTE — ED Notes (Signed)
Patient at imaging.

## 2021-09-07 NOTE — Consult Note (Signed)
PHARMACY -  BRIEF ANTIBIOTIC NOTE   Pharmacy has received consult(s) for Vancomycin and Cefepime from an ED provider.  The patient's profile has been reviewed for ht/wt/allergies/indication/available labs.    One time order(s) placed for Vancomycin 2g IV and Cefepime 2g IV x 1 dose each.  Further antibiotics/pharmacy consults should be ordered by admitting physician if indicated.                       Thank you, Pearla Dubonnet 08/29/2021  7:16 AM

## 2021-09-07 NOTE — ED Triage Notes (Signed)
Pt called as code sepsis and presented via [facility] for infection of his right foot. Pt has hx of multiple amputations on right food. Last had amputation October and has had infection on right side of food that has been leaking fluid and pt complains is swollen, painful and malodorous. Pt non compliant on diabetic medications and HTN medications due to not having financial resources and insurance. Pt has had diarrhea recently but no vomiting or nausea. Pt has been drinking more and urinating more. Previous vitals from EMS: HR 105, BP 105/72, SPO2 100%, Temp: 98.6, BG 374. Pt does not have medicaid or medicare and has inquired about insurance resources/medication options.

## 2021-09-07 NOTE — ED Notes (Signed)
[place], MD at bedside

## 2021-09-07 NOTE — ED Notes (Signed)
Meal tray at bedside.  

## 2021-09-07 NOTE — ED Notes (Signed)
Critical lab values: Lactic 2.9, Ca 13.6, Troponin 5943. EDP notified

## 2021-09-07 NOTE — Progress Notes (Signed)
Pharmacy Antibiotic Note  [name] is a 55 y.o. male admitted on 09/23/2021. Pharmacy has been consulted for vancomycin and cefepime dosing for diabetic foot infection.  Plan: Vancomycin 2 g followed by vancomycin 1500 mg IV q12h Goal AUC 400-550 Expected AUC: 464 SCr used: 0.9  Cefepime 2 g IV q8h   Height: 6\' 3"  (190.5 cm) Weight: 93 kg (205 lb) IBW/kg (Calculated) : 84.5  Temp (24hrs), Avg:98.1 F (36.7 C), Min:97.8 F (36.6 C), Max:98.3 F (36.8 C)  Recent Labs  Lab 09/21/2021 0658  WBC 11.2*  CREATININE 0.90  LATICACIDVEN 2.9*    Estimated Creatinine Clearance: 110.8 mL/min (by C-G formula based on SCr of 0.9 mg/dL).    No Known Allergies  Antimicrobials this admission: Cefepime 12/10 >> Vancomycin 12/10 >>   Microbiology results: 12/10 BCx: pending 12/10 Cx (right foot): pending  Thank you for allowing pharmacy to be a part of this patient's care.  Tawnya Crook, PharmD, BCPS Clinical Pharmacist 09/06/2021 9:59 AM

## 2021-09-07 NOTE — Progress Notes (Signed)
[name], [place] 09/08/2021  Subjective:   Hospital day # 0  Patient known to our practice from consultation last year.  He states that he lives by himself.  Does not have a PCP.  He works as a Astronomer at [employer].  He was trying to manage his right foot ulcer.  He comes in for evaluation of worsening pain, swelling and infection to his right foot. Patient was evaluated by podiatry service and diagnosed with osteomyelitis.  Lab results severely elevated calcium of 13.6. Review of labs show that patient has had hypercalcemia since at least July 2019.   No intake/output data recorded. Lab Results  Component Value Date   CREATININE 0.90 09/10/2021   CREATININE 0.56 (L) 05/22/2021   CREATININE 0.68 05/21/2021     Objective:  Vital signs in last 24 hours:  Temp:  [97.8 F (36.6 C)-98.3 F (36.8 C)] 97.8 F (36.6 C) (12/10 0734) Pulse Rate:  [92-140] 137 (12/10 0900) Resp:  [15-22] 15 (12/10 0900) BP: (97-132)/(70-86) 97/70 (12/10 0900) SpO2:  [97 %-99 %] 98 % (12/10 0900) Weight:  [93 kg] 93 kg (12/10 0648)  Weight change:  Filed Weights   09/28/2021 0648  Weight: 93 kg    Intake/Output:    Intake/Output Summary (Last 24 hours) at 09/19/2021 1000 Last data filed at 09/05/2021 1610 Gross per 24 hour  Intake 198.2 ml  Output --  Net 198.2 ml     Physical Exam: General: Ill-appearing, laying in the bed, no acute distress  HEENT Moist oral mucous membrane  Pulm/lungs Normal breathing effort, clear to auscultation  CVS/Heart Regular, no  Abdomen:  Soft, nontender  Extremities: Right foot lateral dependent ulcer with redness, tenderness  Neurologic: Alert, oriented  Skin: Redness and edema of the right foot with ulcer          Basic Metabolic Panel:  Recent Labs  Lab 09/06/2021 0658  NA 128*  K 4.0  CL 87*  CO2 27  GLUCOSE 317*  BUN 18  CREATININE 0.90  CALCIUM 13.6*     CBC: Recent Labs  Lab 09/24/2021 0658   WBC 11.2*  NEUTROABS 10.6*  HGB 14.4  HCT 41.1  MCV 88.6  PLT 185     No results found for: HEPBSAG, HEPBSAB, HEPBIGM    Microbiology:  Recent Results (from the past 240 hour(s))  Resp Panel by RT-PCR (Flu A&B, Covid) Nasopharyngeal Swab     Status: None   Collection Time: 09/08/2021  7:44 AM   Specimen: Nasopharyngeal Swab; Nasopharyngeal(NP) swabs in vial transport medium  Result Value Ref Range Status   SARS Coronavirus 2 by RT PCR NEGATIVE NEGATIVE Final    Comment: (NOTE) SARS-CoV-2 target nucleic acids are NOT DETECTED.  The SARS-CoV-2 RNA is generally detectable in upper respiratory specimens during the acute phase of infection. The lowest concentration of SARS-CoV-2 viral copies this assay can detect is 138 copies/mL. A negative result does not preclude SARS-Cov-2 infection and should not be used as the sole basis for treatment or other patient management decisions. A negative result may occur with  improper specimen collection/handling, submission of specimen other than nasopharyngeal swab, presence of viral mutation(s) within the areas targeted by this assay, and inadequate number of viral copies(<138 copies/mL). A negative result must be combined with clinical observations, patient history, and epidemiological information. The expected result is Negative.  Fact Sheet for Patients:  EntrepreneurPulse.com.au  Fact Sheet for Healthcare Providers:  IncredibleEmployment.be  This test is no t  yet approved or cleared by the [place] and  has been authorized for detection and/or diagnosis of SARS-CoV-2 by FDA under an Emergency Use Authorization (EUA). This EUA will remain  in effect (meaning this test can be used) for the duration of the COVID-19 declaration under Section 564(b)(1) of the Act, 21 U.S.C.section 360bbb-3(b)(1), unless the authorization is terminated  or revoked sooner.       Influenza A by PCR NEGATIVE  NEGATIVE Final   Influenza B by PCR NEGATIVE NEGATIVE Final    Comment: (NOTE) The Xpert Xpress SARS-CoV-2/FLU/RSV plus assay is intended as an aid in the diagnosis of influenza from Nasopharyngeal swab specimens and should not be used as a sole basis for treatment. Nasal washings and aspirates are unacceptable for Xpert Xpress SARS-CoV-2/FLU/RSV testing.  Fact Sheet for Patients: EntrepreneurPulse.com.au  Fact Sheet for Healthcare Providers: IncredibleEmployment.be  This test is not yet approved or cleared by the [place] FDA and has been authorized for detection and/or diagnosis of SARS-CoV-2 by FDA under an Emergency Use Authorization (EUA). This EUA will remain in effect (meaning this test can be used) for the duration of the COVID-19 declaration under Section 564(b)(1) of the Act, 21 U.S.C. section 360bbb-3(b)(1), unless the authorization is terminated or revoked.  Performed at [facility], [address] [place], [place] [address]     Coagulation Studies: No results for input(s): LABPROT, INR in the last 72 hours.  Urinalysis: No results for input(s): COLORURINE, LABSPEC, PHURINE, GLUCOSEU, HGBUR, BILIRUBINUR, KETONESUR, PROTEINUR, UROBILINOGEN, NITRITE, LEUKOCYTESUR in the last 72 hours.  Invalid input(s): APPERANCEUR    Imaging: DG Chest 1 View  Result Date: 09/02/2021 CLINICAL DATA:  Sepsis. EXAM: CHEST  1 VIEW COMPARISON:  None. FINDINGS: The heart size and mediastinal contours are within normal limits. Both lungs are clear. The visualized skeletal structures are unremarkable. IMPRESSION: No active disease. Electronically Signed   By: Marijo Conception M.D.   On: 09/18/2021 07:50   DG Foot Complete Right  Result Date: 09/28/2021 CLINICAL DATA:  Right foot infection.  Open wound. EXAM: RIGHT FOOT COMPLETE - 3+ VIEW COMPARISON:  May 09, 2021. FINDINGS: Status post transmetatarsal amputation of the foot. Large  soft tissue ulceration is seen overlying the residual portion of the proximal fifth metatarsal. There is irregularity involving this bone, but it is uncertain if it represents postsurgical change or possibly osteomyelitis. IMPRESSION: Large soft tissue ulceration is seen overlying the residual portion of proximal fifth metatarsal. There is noted irregularity involving this bone, but it is uncertain if it represents postsurgical change or possibly osteomyelitis. Electronically Signed   By: Marijo Conception M.D.   On: 09/11/2021 07:53     Medications:    ceFEPime (MAXIPIME) IV     heparin 1,200 Units/hr (09/17/2021 0909)   lactated ringers 150 mL/hr at 09/11/2021 0911   metronidazole     vancomycin     vancomycin 2,000 mg (09/14/2021 0814)    [START ON 09/08/2021] aspirin EC  81 mg Oral Daily   atorvastatin  40 mg Oral QPM   insulin aspart  0-15 Units Subcutaneous TID WC   insulin detemir  20 Units Subcutaneous Daily   metoprolol tartrate  12.5 mg Oral BID   acetaminophen, ondansetron **OR** ondansetron (ZOFRAN) IV, oxyCODONE  Assessment/ Plan:  55 y.o. male with medical problems of hypertension, diabetes, dyslipidemia, status post TMA of the right foot on 05/14/2021, history of hypercalcemia  admitted on 09/04/2021 for Sepsis [facility]) [A41.9] And evaluation of  painful ulcer over the lateral portion of her right foot with increased purulent drainage  Hypercalcemia, likely primary hyperparathyroidism Lab Results  Component Value Date   PTH 240 (H) 05/21/2021   CALCIUM 13.6 (HH) 09/08/2021   PHOS 1.9 (L) 07/12/2020    Elevated PTH CXR - neg for findings of sarcoid Thyroid studies normal in 06/2020 SPEP neg for myeloma in 06/2020 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  Right diabetic foot  ulcer/osteomyelitis Patient has been evaluated by podiatrist. Vascular surgery evaluation recommended.   Patient may end up needing right BKA.    LOS: 0 Tamela Elsayed 12/10/202210:00 AM  [place], [place]  Note: This note was prepared with Dragon dictation. Any transcription errors are unintentional

## 2021-09-07 NOTE — Consult Note (Signed)
ORTHOPAEDIC CONSULTATION  REQUESTING PHYSICIAN: Collier Bullock, MD  Chief Complaint: Diabetic foot infection right foot  HPI: [name] is a 55 y.o. male who complains of worsening pain swelling and infection to his right foot.  Well-known to me.  Underwent transmetatarsal amputation for fairly severe infection several months ago.  He states over the last few weeks he has been feeling fairly poorly.  He states over the last couple of days he has had severe worsening pain into his foot.  Presented to the ER.  X-rays showed gas on the soft tissue.  Elevated white blood cell count.    Past Medical History:  Diagnosis Date   Diabetes [facility])    Hypertension    Past Surgical History:  Procedure Laterality Date   AMPUTATION TOE Right 07/11/2020   Procedure: AMPUTATION TOE-Right Great Toe;  Surgeon: Samara Deist, DPM;  Location: [facility] ORS;  Service: Podiatry;  Laterality: Right;   I & D EXTREMITY Right 05/14/2021   Procedure: IRRIGATION AND DEBRIDEMENT EXTREMITY- RIGHT FOOT;  Surgeon: Samara Deist, DPM;  Location: [facility] ORS;  Service: Podiatry;  Laterality: Right;   IRRIGATION AND DEBRIDEMENT ABSCESS Right 10/31/2018   Procedure: IRRIGATION AND DEBRIDEMENT ABSCESS;  Surgeon: Samara Deist, DPM;  Location: [facility] ORS;  Service: Podiatry;  Laterality: Right;   IRRIGATION AND DEBRIDEMENT FOOT Right 11/02/2018   Procedure: IRRIGATION AND DEBRIDEMENT FOOT;  Surgeon: Sharlotte Alamo, DPM;  Location: [facility] ORS;  Service: Podiatry;  Laterality: Right;   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   Procedure: TRANSMETATARSAL AMPUTATION;  Surgeon: Samara Deist, DPM;  Location: [facility] ORS;  Service: Podiatry;  Laterality: Right;   Social History   Socioeconomic History   Marital status: Single    Spouse name: Not on file   Number of children: Not on file   Years of education: Not on file   Highest education level: Not on file  Occupational History   Not on file  Tobacco Use   Smoking status: Light  Smoker    Types: Cigars   Smokeless tobacco: Never  Vaping Use   Vaping Use: Never used  Substance and Sexual Activity   Alcohol use: Yes    Comment: "too much"    Drug use: Never   Sexual activity: Yes  Other Topics Concern   Not on file  Social History Narrative   Not on file   Social Determinants of Health   Financial Resource Strain: Not on file  Food Insecurity: Not on file  Transportation Needs: Not on file  Physical Activity: Not on file  Stress: Not on file  Social Connections: Not on file   Family History  Problem Relation Age of Onset   CAD Father    CAD Brother    No Known Allergies Prior to Admission medications   Medication Sig Start Date End Date Taking? Authorizing Provider  acetaminophen (TYLENOL) 325 MG tablet Take 2 tablets (650 mg total) by mouth every 6 (six) hours as needed for mild pain (or Fever >/= 101). 05/22/21  Yes Arrien, Jimmy Picket, MD  oxyCODONE (OXY IR/ROXICODONE) 5 MG immediate release tablet Take 1 tablet (5 mg total) by mouth every 6 (six) hours as needed for moderate pain. 05/22/21  Yes Arrien, Jimmy Picket, MD  amoxicillin-clavulanate (AUGMENTIN) 875-125 MG tablet Take 1 tablet by mouth 2 (two) times daily  for 14 days. Patient not taking: Reported on 09/10/2021 06/13/21   Tsosie Billing, MD  atorvastatin (LIPITOR) 40 MG tablet Take 40 mg by mouth daily.    [provider]  glipiZIDE (GLUCOTROL XL) 10 MG 24 hr tablet Take 20 mg by mouth daily with breakfast. Patient not taking: Reported on 09/23/2021    [provider]   DG Chest 1 View  Result Date: 09/22/2021 CLINICAL DATA:  Sepsis. EXAM: CHEST  1 VIEW COMPARISON:  None. FINDINGS: The heart size and mediastinal contours are within normal limits. Both lungs are clear. The visualized skeletal structures are unremarkable. IMPRESSION: No active disease. Electronically Signed   By: Marijo Conception M.D.   On: 09/03/2021 07:50   DG Foot Complete Right  Result  Date: 09/26/2021 CLINICAL DATA:  Right foot infection.  Open wound. EXAM: RIGHT FOOT COMPLETE - 3+ VIEW COMPARISON:  May 09, 2021. FINDINGS: Status post transmetatarsal amputation of the foot. Large soft tissue ulceration is seen overlying the residual portion of the proximal fifth metatarsal. There is irregularity involving this bone, but it is uncertain if it represents postsurgical change or possibly osteomyelitis. IMPRESSION: Large soft tissue ulceration is seen overlying the residual portion of proximal fifth metatarsal. There is noted irregularity involving this bone, but it is uncertain if it represents postsurgical change or possibly osteomyelitis. Electronically Signed   By: Marijo Conception M.D.   On: 09/04/2021 07:53    Positive ROS: All other systems have been reviewed and were otherwise negative with the exception of those mentioned in the HPI and as above.  12 point ROS was performed.  Physical Exam: General: Alert and oriented.  No apparent distress.  Vascular:  Left foot:Dorsalis Pedis:  present Posterior Tibial:  present  Right foot: Dorsalis Pedis:  diminished Posterior Tibial:  diminished.  I believe diminished pulses likely secondary to edema.  Foot is warm to touch.  Neuro:absent objective sensation  Derm: Left foot without ulceration.  Right foot with large necrotic ulceration with severe purulence and foul odor.  This is overlying the entire fifth metatarsal base area.  Severe erythema from the ankle distally.  Ortho/MS: Diffuse edema to the right lower extremity.  He status post transmetatarsal amputation.  Crepitance with palpation of the soft tissue diffusely to the dorsal foot lateral foot and ankle region.  Assessment: Diabetic foot infection right foot Osteomyelitis right foot Gas producing infection right foot and ankle  Plan: I reviewed the x-rays that shows the large ulceration laterally.  I believe there is obvious osteomyelitis of the fifth metatarsal.   There is noted gas on the lateral aspect of the foot and on the oblique view there is gas coursing to the posterior aspect of the fibula.  I will order further imaging including an x-ray of the ankle as well as MRI of the ankle.  Due to this severe infection I discussed the possibility of needing to go undergo amputation of the right lower extremity.  I do not believe there is currently much from a salvage standpoint.  He has crepitus throughout the foot and ankle and severe necrotic tissue.  I have consulted vascular surgery for evaluation.  I am concerned this infection may extend posterior to the ankle and to the lower leg.    Elesa Hacker, DPM Cell [phone]   09/27/2021 10:19 AM

## 2021-09-07 NOTE — Consult Note (Signed)
ANTICOAGULATION CONSULT NOTE - Initial Consult  Pharmacy Consult for Heparin infusion Indication: chest pain/ACS  No Known Allergies  Patient Measurements: Height: 6\' 3"  (190.5 cm) Weight: 93 kg (205 lb) IBW/kg (Calculated) : 84.5 Heparin Dosing Weight: 93 kg  Vital Signs: Temp: 97.8 F (36.6 C) (12/10 0734) Temp Source: Rectal (12/10 0734) BP: 110/73 (12/10 1430) Pulse Rate: 88 (12/10 1430)  Labs: Recent Labs    09/01/2021 0658 09/10/2021 0659 09/05/2021 0912 09/15/2021 1206 09/08/2021 1444  HGB 14.4  --   --   --   --   HCT 41.1  --   --   --   --   PLT 185  --   --   --   --   APTT  --  38*  --   --   --   LABPROT  --  15.6*  --   --   --   INR  --  1.2  --   --   --   HEPARINUNFRC  --   --   --   --  <0.10*  CREATININE 0.90  --   --   --   --   TROPONINIHS 5,943*  --  5,878* 4,796* 4,189*     Estimated Creatinine Clearance: 110.8 mL/min (by C-G formula based on SCr of 0.9 mg/dL).   Medical History: Past Medical History:  Diagnosis Date   Diabetes ([place])    Hypertension     Medications:  No prior history of chronic anticoagulation PTA medications  Assessment: Pharmacy has been consulted to initiate heparin infusion in 55yo patient presenting to the ED with right foot pain. Patient denies any chest pain but has troponin levels of 5,943 and 5,878, respectively. Likely demand ischemia d/t sepsis. Baseline labs have been ordered and are pending.  Date Time HL Rate/comment 12/10 1444 <0.10 1200 un/hr, subthera  Goal of Therapy:  Heparin level 0.3-0.7 units/ml Monitor platelets by anticoagulation protocol: Yes   Plan:  Heparin level undetectable Give 2800 units bolus x 1 Increase heparin infusion to 1500 units/hr Check anti-Xa level 6 hours after rate change and daily while on heparin Continue to monitor H&H and platelets  Darnelle Bos, PharmD 09/08/2021,4:05 PM

## 2021-09-07 NOTE — Consult Note (Signed)
[facility] [place]  Patient name: [name] MRN: [id number] DOB: 12-12-1965 Sex: male   REQUESTING PROVIDER:   Dr. Vickki Muff   REASON FOR CONSULT:    Right foot infection  HISTORY OF PRESENT ILLNESS:   [name] is a 55 y.o. male, who presented to the emergency department today with a right foot infection.  He is status post transmetatarsal amputation for infection several months ago.  This has healed.  For the past several weeks, he has not felt well and recently had worsening pain in his foot.  He underwent x-rays of the foot that showed soft tissue gas.  I have been asked to consult for possible transtibial amputation.  Patient is a diabetic with hypertension.  He is a current smoker.  PAST MEDICAL HISTORY    Past Medical History:  Diagnosis Date   Diabetes ([place])    Hypertension      FAMILY HISTORY   Family History  Problem Relation Age of Onset   CAD Father    CAD Brother     SOCIAL HISTORY:   Social History   Socioeconomic History   Marital status: Single    Spouse name: Not on file   Number of children: Not on file   Years of education: Not on file   Highest education level: Not on file  Occupational History   Not on file  Tobacco Use   Smoking status: Light Smoker    Types: Cigars   Smokeless tobacco: Never  Vaping Use   Vaping Use: Never used  Substance and Sexual Activity   Alcohol use: Yes    Comment: "too much"    Drug use: Never   Sexual activity: Yes  Other Topics Concern   Not on file  Social History Narrative   Not on file   Social Determinants of Health   Financial Resource Strain: Not on file  Food Insecurity: Not on file  Transportation Needs: Not on file  Physical Activity: Not on file  Stress: Not on file  Social Connections: Not on file  Intimate Partner Violence: Not on file    ALLERGIES:    No Known Allergies  CURRENT MEDICATIONS:    Current  Facility-Administered Medications  Medication Dose Route Frequency Provider Last Rate Last Admin   acetaminophen (TYLENOL) tablet 650 mg  650 mg Oral Q6H PRN Agbata, Tochukwu, MD       [START ON 09/08/2021] aspirin EC tablet 81 mg  81 mg Oral Daily Agbata, Tochukwu, MD       atorvastatin (LIPITOR) tablet 40 mg  40 mg Oral QPM Agbata, Tochukwu, MD   40 mg at 09/18/2021 1707   ceFEPIme (MAXIPIME) 2 g in sodium chloride 0.9 % 100 mL IVPB  2 g Intravenous Q8H Ellington, Abby K, RPH   Stopped at 09/27/2021 1519   cinacalcet (SENSIPAR) tablet 30 mg  30 mg Oral Q supper Murlean Iba, MD   30 mg at 09/01/2021 1707   heparin ADULT infusion 100 units/mL (25000 units/245mL)  1,500 Units/hr Intravenous Continuous Darnelle Bos, RPH 15 mL/hr at 09/26/2021 1623 1,500 Units/hr at 09/24/2021 1623   insulin aspart (novoLOG) injection 0-15 Units  0-15 Units Subcutaneous TID WC Agbata, Tochukwu, MD   5 Units at 09/27/2021 1706   insulin detemir (LEVEMIR) injection 20 Units  20 Units Subcutaneous Daily Agbata, Tochukwu, MD   20 Units at 09/11/2021 1108   lactated ringers infusion   Intravenous Continuous Ward, Kristen N, DO 150 mL/hr at  09/28/2021 0911 New Bag at 09/20/2021 0911   metoprolol tartrate (LOPRESSOR) tablet 12.5 mg  12.5 mg Oral BID Agbata, Tochukwu, MD       metroNIDAZOLE (FLAGYL) IVPB 500 mg  500 mg Intravenous Q12H Agbata, Tochukwu, MD 100 mL/hr at 09/17/2021 1712 500 mg at 08/30/2021 1712   ondansetron (ZOFRAN) tablet 4 mg  4 mg Oral Q6H PRN Agbata, Tochukwu, MD       Or   ondansetron (ZOFRAN) injection 4 mg  4 mg Intravenous Q6H PRN Agbata, Tochukwu, MD       oxyCODONE (Oxy IR/ROXICODONE) immediate release tablet 5 mg  5 mg Oral Q6H PRN Agbata, Tochukwu, MD       vancomycin (VANCOREADY) IVPB 1500 mg/300 mL  1,500 mg Intravenous Q12H Ellington, Abby K, RPH        REVIEW OF SYSTEMS:   [X]  denotes positive finding, [ ]  denotes negative finding Cardiac  Comments:  Chest pain or chest pressure:    Shortness of breath  upon exertion:    Short of breath when lying flat:    Irregular heart rhythm:        Vascular    Pain in calf, thigh, or hip brought on by ambulation:    Pain in feet at night that wakes you up from your sleep:     Blood clot in your veins:    Leg swelling:         Pulmonary    Oxygen at home:    Productive cough:     Wheezing:         Neurologic    Sudden weakness in arms or legs:     Sudden numbness in arms or legs:     Sudden onset of difficulty speaking or slurred speech:    Temporary loss of vision in one eye:     Problems with dizziness:         Gastrointestinal    Blood in stool:      Vomited blood:         Genitourinary    Burning when urinating:     Blood in urine:        Psychiatric    Major depression:         Hematologic    Bleeding problems:    Problems with blood clotting too easily:        Skin    Rashes or ulcers:        Constitutional    Fever or chills:     PHYSICAL EXAM:   Vitals:   09/10/2021 1430 09/15/2021 1600 09/17/2021 1700 08/30/2021 1808  BP: 110/73 114/74 105/66 118/77  Pulse: 88 91 93 99  Resp: 17 (!) 25 (!) 22 (!) 21  Temp:      TempSrc:      SpO2: 100% 98% 98% 96%  Weight:      Height:        GENERAL: The patient is a well-nourished male, in no acute distress. The vital signs are documented above. CARDIAC: There is a regular rate and rhythm.  VASCULAR: Palpable femoral and popliteal pulse on the right PULMONARY: Nonlabored respirations ABDOMEN: Soft and non-tender with normal pitched bowel sounds.  MUSCULOSKELETAL: Healed transmetatarsal amputation on the right with open wound on the lateral malleolus.  Erythema extending up to the ankle. NEUROLOGIC: No focal weakness or paresthesias are detected. PSYCHIATRIC: The patient has a normal affect.   STUDIES:   I have reviewed his x-rays with the following findings  which show soft tissue gas around the lateral malleolus   ASSESSMENT and PLAN   Severe right foot infection: I  do not believe the patient has a salvageable foot.  He will need transtibial amputation.  He does not have signs of necrotizing fasciitis.  He has a MRI pending.  I would recommend admission for IV antibiotics and monitoring of the foot with plans for transtibial amputation in the next few days.   Leia Alf, MD, FACS [facility] [phone] Pager [phone]

## 2021-09-07 NOTE — Consult Note (Signed)
CODE SEPSIS - PHARMACY COMMUNICATION  **Broad Spectrum Antibiotics should be administered within 1 hour of Sepsis diagnosis**  Time Code Sepsis Called/Page Received: 0711  Antibiotics Ordered: Vancomycin,Cefepime,Metronidazole  Time of 1st antibiotic administration: 0740  Additional action taken by pharmacy: none  If necessary, Name of Provider/Nurse Contacted: n/a    Pearla Dubonnet ,PharmD Clinical Pharmacist  09/08/2021  8:41 AM

## 2021-09-08 ENCOUNTER — Inpatient Hospital Stay: Payer: Medicaid Other

## 2021-09-08 ENCOUNTER — Inpatient Hospital Stay: Admit: 2021-09-08 | Payer: Medicaid Other

## 2021-09-08 DIAGNOSIS — M8610 Other acute osteomyelitis, unspecified site: Secondary | ICD-10-CM

## 2021-09-08 DIAGNOSIS — L97403 Non-pressure chronic ulcer of unspecified heel and midfoot with necrosis of muscle: Secondary | ICD-10-CM

## 2021-09-08 DIAGNOSIS — F17218 Nicotine dependence, cigarettes, with other nicotine-induced disorders: Secondary | ICD-10-CM

## 2021-09-08 DIAGNOSIS — A419 Sepsis, unspecified organism: Secondary | ICD-10-CM

## 2021-09-08 DIAGNOSIS — E08621 Diabetes mellitus due to underlying condition with foot ulcer: Secondary | ICD-10-CM

## 2021-09-08 DIAGNOSIS — I480 Paroxysmal atrial fibrillation: Secondary | ICD-10-CM

## 2021-09-08 LAB — C-REACTIVE PROTEIN: CRP: 24.6 mg/dL — ABNORMAL HIGH (ref <1.0)

## 2021-09-08 LAB — GLUCOSE, CAPILLARY
Glucose-Capillary: 112 mg/dL — ABNORMAL HIGH (ref 70–99)
Glucose-Capillary: 135 mg/dL — ABNORMAL HIGH (ref 70–99)
Glucose-Capillary: 38 mg/dL — CL (ref 70–99)
Glucose-Capillary: 63 mg/dL — ABNORMAL LOW (ref 70–99)
Glucose-Capillary: 76 mg/dL (ref 70–99)
Glucose-Capillary: 92 mg/dL (ref 70–99)

## 2021-09-08 LAB — BASIC METABOLIC PANEL
Anion gap: 5 (ref 5–15)
BUN: 16 mg/dL (ref 6–20)
CO2: 29 mmol/L (ref 22–32)
Calcium: 13.3 mg/dL (ref 8.9–10.3)
Chloride: 94 mmol/L — ABNORMAL LOW (ref 98–111)
Creatinine, Ser: 0.66 mg/dL (ref 0.61–1.24)
GFR, Estimated: >60 mL/min (ref >60)
Glucose, Bld: 126 mg/dL — ABNORMAL HIGH (ref 70–99)
Potassium: 3.2 mmol/L — ABNORMAL LOW (ref 3.5–5.1)
Sodium: 128 mmol/L — ABNORMAL LOW (ref 135–145)

## 2021-09-08 LAB — HEPARIN LEVEL (UNFRACTIONATED)
Heparin Unfractionated: <0.1 IU/mL — ABNORMAL LOW (ref 0.30–0.70)
Heparin Unfractionated: <0.1 IU/mL — ABNORMAL LOW (ref 0.30–0.70)
Heparin Unfractionated: <0.1 IU/mL — ABNORMAL LOW (ref 0.30–0.70)

## 2021-09-08 LAB — CBC
HCT: 36.2 % — ABNORMAL LOW (ref 39.0–52.0)
Hemoglobin: 12.6 g/dL — ABNORMAL LOW (ref 13.0–17.0)
MCH: 30.5 pg (ref 26.0–34.0)
MCHC: 34.8 g/dL (ref 30.0–36.0)
MCV: 87.7 fL (ref 80.0–100.0)
Platelets: 164 10*3/uL (ref 150–400)
RBC: 4.13 MIL/uL — ABNORMAL LOW (ref 4.22–5.81)
RDW: 11.9 % (ref 11.5–15.5)
WBC: 12.4 10*3/uL — ABNORMAL HIGH (ref 4.0–10.5)
nRBC: 0 % (ref 0.0–0.2)

## 2021-09-08 LAB — PROCALCITONIN: Procalcitonin: 8.58 ng/mL

## 2021-09-08 LAB — CORTISOL-AM, BLOOD: Cortisol - AM: 45.2 ug/dL — ABNORMAL HIGH (ref 6.7–22.6)

## 2021-09-08 LAB — MAGNESIUM: Magnesium: 1.6 mg/dL — ABNORMAL LOW (ref 1.7–2.4)

## 2021-09-08 LAB — HIV ANTIBODY (ROUTINE TESTING W REFLEX): HIV Screen 4th Generation wRfx: NONREACTIVE

## 2021-09-08 LAB — SODIUM: Sodium: 132 mmol/L — ABNORMAL LOW (ref 135–145)

## 2021-09-08 LAB — PARATHYROID HORMONE, INTACT (NO CA): PTH: 79 pg/mL — ABNORMAL HIGH (ref 15–65)

## 2021-09-08 LAB — PREALBUMIN: Prealbumin: <5 mg/dL — ABNORMAL LOW (ref 18–38)

## 2021-09-08 MED ORDER — DEXTROSE 50 % IV SOLN
INTRAVENOUS | Status: AC
  Administered 2021-09-08: 50 mL
  Filled 2021-09-08: qty 50

## 2021-09-08 MED ORDER — LACTATED RINGERS IV SOLN
INTRAVENOUS | Status: DC
  Administered 2021-09-08: 1000 mL via INTRAVENOUS

## 2021-09-08 MED ORDER — LORAZEPAM 1 MG PO TABS: 1.0000 mg | ORAL_TABLET | ORAL | Status: DC | PRN

## 2021-09-08 MED ORDER — POTASSIUM CHLORIDE CRYS ER 20 MEQ PO TBCR
40.0000 meq | EXTENDED_RELEASE_TABLET | Freq: Once | ORAL | Status: AC
  Administered 2021-09-08: 40 meq via ORAL
  Filled 2021-09-08: qty 2

## 2021-09-08 MED ORDER — VANCOMYCIN HCL 1750 MG/350ML IV SOLN
1750.0000 mg | Freq: Two times a day (BID) | INTRAVENOUS | Status: DC
  Administered 2021-09-08 – 2021-09-11 (×6): 1750 mg via INTRAVENOUS
  Filled 2021-09-08 (×10): qty 350

## 2021-09-08 MED ORDER — THIAMINE HCL 100 MG PO TABS: 100.0000 mg | ORAL_TABLET | Freq: Every day | ORAL | Status: DC

## 2021-09-08 MED ORDER — METOPROLOL TARTRATE 25 MG PO TABS
25.0000 mg | ORAL_TABLET | Freq: Two times a day (BID) | ORAL | Status: DC
  Administered 2021-09-09: 25 mg via ORAL
  Filled 2021-09-08: qty 1

## 2021-09-08 MED ORDER — ACETAMINOPHEN 650 MG RE SUPP
650.0000 mg | RECTAL | Status: DC | PRN
  Administered 2021-09-08 – 2021-09-09 (×2): 650 mg via RECTAL
  Filled 2021-09-08 (×3): qty 1

## 2021-09-08 MED ORDER — HEPARIN BOLUS VIA INFUSION
3000.0000 [IU] | Freq: Once | INTRAVENOUS | Status: AC
  Administered 2021-09-08: 3000 [IU] via INTRAVENOUS
  Filled 2021-09-08: qty 3000

## 2021-09-08 MED ORDER — FOLIC ACID 1 MG PO TABS
1.0000 mg | ORAL_TABLET | Freq: Every day | ORAL | Status: DC
  Filled 2021-09-08: qty 1

## 2021-09-08 MED ORDER — CHLORHEXIDINE GLUCONATE CLOTH 2 % EX PADS: 6.0000 | MEDICATED_PAD | Freq: Every day | CUTANEOUS | Status: DC

## 2021-09-08 MED ORDER — LORAZEPAM 2 MG/ML IJ SOLN
1.0000 mg | INTRAMUSCULAR | Status: DC | PRN
  Administered 2021-09-08 – 2021-09-09 (×2): 2 mg via INTRAVENOUS
  Filled 2021-09-08 (×2): qty 1

## 2021-09-08 MED ORDER — ADULT MULTIVITAMIN W/MINERALS CH: 1.0000 | ORAL_TABLET | Freq: Every day | ORAL | Status: DC

## 2021-09-08 MED ORDER — THIAMINE HCL 100 MG/ML IJ SOLN
100.0000 mg | Freq: Every day | INTRAMUSCULAR | Status: DC
  Administered 2021-09-08: 16:00:00 100 mg via INTRAVENOUS
  Filled 2021-09-08: qty 2

## 2021-09-08 MED ORDER — MAGNESIUM SULFATE 2 GM/50ML IV SOLN
2.0000 g | Freq: Once | INTRAVENOUS | Status: AC
  Administered 2021-09-08: 2 g via INTRAVENOUS
  Filled 2021-09-08: qty 50

## 2021-09-08 NOTE — Progress Notes (Signed)
PROGRESS NOTE    [name]  DGU:[id number] DOB: Mar 24, 1966 DOA: 09/20/2021 PCP: Pcp, No    Brief Narrative:  [name] is a 55 y.o. 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 Patient states that postsurgery he followed-up with the podiatrist once but has not gone back since then.  Chart review showed he followed up with the podiatrist on 07/09/2021 and at that time he had a 2 x 2 full-thickness ulcer without signs of infection.  12/11 dressing wet, weeping with fluid and some pus  Consultants:  Cardiology, vascular surgery, podiatry  Procedures:  MRI of ankle 1. Bone marrow edema about the base of the fifth metatarsal and cuboid bone, which in the presence of adjacent skin defect is highly suspicious for acute osteomyelitis.  2. Generalized subcutaneous soft tissue edema concerning for cellulitis. No drainable fluid collection or abscess.  3. Fluid tracking along the flexor and peroneal compartments tendon concerning for tenosynovitis    Antimicrobials:  Cefepime, metronidazole, vancomycin   Subjective: Pt reports this am dripping from dressing. Has no other complaints. Later received called from nsg and also podiatry pt was confused.   Objective: Vitals:   09/28/2021 2205 09/15/2021 2259 09/08/21 0455 09/08/21 0757  BP: 108/70 112/73 110/75 112/73  Pulse: 97 97 90 95  Resp:  16 20   Temp:  98.9 F (37.2 C) 98.8 F (37.1 C) 98.9 F (37.2 C)  TempSrc:  Oral  Oral  SpO2:  94% 93% 92%  Weight:      Height:        Intake/Output Summary (Last 24 hours) at 09/08/2021 0923 Last data filed at 09/08/2021 0600 Gross per 24 hour  Intake 1865.53 ml  Output 150 ml  Net 1715.53 ml   Filed Weights   09/15/2021 0648  Weight: 93 kg     Examination:  General exam: Appears calm and comfortable this am Respiratory system: Clear to auscultation. Respiratory effort normal. Cardiovascular system: S1 & S2 heard, RRR. No JVD, murmurs, rubs, gallops or clicks. No pedal edema. Gastrointestinal system: Abdomen is nondistended, soft and nontender. No organomegaly or masses felt. Normal bowel sounds heard. Central nervous system: grossly intact Extremities: RLE dressing wet, dripping , mild erythema Psychiatry: Mood & affect appropriate.     Data Reviewed: I have personally reviewed following labs and imaging studies  CBC: Recent Labs  Lab 09/14/2021 0658 09/08/21 0616  WBC 11.2* 12.4*  NEUTROABS 10.6*  --   HGB 14.4 12.6*  HCT 41.1 36.2*  MCV 88.6 87.7  PLT 185 956   Basic Metabolic Panel: Recent Labs  Lab 09/03/2021 0658 09/20/2021 0912 09/08/21 0616  NA 128*  --  128*  K 4.0  --  3.2*  CL 87*  --  94*  CO2 27  --  29  GLUCOSE 317*  --  126*  BUN 18  --  16  CREATININE 0.90  --  0.66  CALCIUM 13.6*  --  13.3*  MG  --  1.0* 1.6*   GFR: Estimated Creatinine Clearance: 124.7 mL/min (by C-G formula based on SCr of 0.66 mg/dL). Liver Function Tests: Recent Labs  Lab 09/10/2021 0658  AST 33  ALT 23  ALKPHOS 94  BILITOT 3.1*  PROT 6.4*  ALBUMIN 2.6*   No results  for input(s): LIPASE, AMYLASE in the last 168 hours. No results for input(s): AMMONIA in the last 168 hours. Coagulation Profile: Recent Labs  Lab 09/26/2021 0659  INR 1.2   Cardiac Enzymes: No results for input(s): CKTOTAL, CKMB, CKMBINDEX, TROPONINI in the last 168 hours. BNP (last 3 results) No results for input(s): PROBNP in the last 8760 hours. HbA1C: No results for input(s): HGBA1C in the last 72 hours. CBG: Recent Labs  Lab 09/02/2021 1102 09/06/2021 1702 09/28/2021 2023 09/08/21 0759  GLUCAP 309* 211* 140* 135*   Lipid Profile: No results for input(s): CHOL, HDL, LDLCALC, TRIG, CHOLHDL, LDLDIRECT in the last 72 hours. Thyroid  Function Tests: No results for input(s): TSH, T4TOTAL, FREET4, T3FREE, THYROIDAB in the last 72 hours. Anemia Panel: No results for input(s): VITAMINB12, FOLATE, FERRITIN, TIBC, IRON, RETICCTPCT in the last 72 hours. Sepsis Labs: Recent Labs  Lab 08/29/2021 0658 09/27/2021 0912 09/15/2021 1206 09/14/2021 1444 09/08/21 0616  PROCALCITON 15.80  --   --   --  8.58  LATICACIDVEN 2.9* 2.9* 2.8* 2.8*  --     Recent Results (from the past 240 hour(s))  Culture, blood (routine x 2)     Status: None (Preliminary result)   Collection Time: 09/16/2021  6:58 AM   Specimen: BLOOD  Result Value Ref Range Status   Specimen Description BLOOD LEFT FA  Final   Special Requests   Final    BOTTLES DRAWN AEROBIC AND ANAEROBIC Blood Culture adequate volume   Culture   Final    NO GROWTH < 24 HOURS Performed at [facility], [address], [place], [place] [address]    Report Status PENDING  Incomplete  Culture, blood (routine x 2)     Status: None (Preliminary result)   Collection Time: 09/14/2021  6:59 AM   Specimen: BLOOD  Result Value Ref Range Status   Specimen Description BLOOD RIGHT FA  Final   Special Requests   Final    BOTTLES DRAWN AEROBIC AND ANAEROBIC Blood Culture adequate volume   Culture   Final    NO GROWTH < 24 HOURS Performed at [facility], [address] [place], [place] [address]    Report Status PENDING  Incomplete  Resp Panel by RT-PCR (Flu A&B, Covid) Nasopharyngeal Swab     Status: None   Collection Time: 09/15/2021  7:44 AM   Specimen: Nasopharyngeal Swab; Nasopharyngeal(NP) swabs in vial transport medium  Result Value Ref Range Status   SARS Coronavirus 2 by RT PCR NEGATIVE NEGATIVE Final    Comment: (NOTE) SARS-CoV-2 target nucleic acids are NOT DETECTED.  The SARS-CoV-2 RNA is generally detectable in upper respiratory specimens during the acute phase of infection. The lowest concentration of SARS-CoV-2 viral copies this assay can detect is 138  copies/mL. A negative result does not preclude SARS-Cov-2 infection and should not be used as the sole basis for treatment or other patient management decisions. A negative result may occur with  improper specimen collection/handling, submission of specimen other than nasopharyngeal swab, presence of viral mutation(s) within the areas targeted by this assay, and inadequate number of viral copies(<138 copies/mL). A negative result must be combined with clinical observations, patient history, and epidemiological information. The expected result is Negative.  Fact Sheet for Patients:  EntrepreneurPulse.com.au  Fact Sheet for Healthcare Providers:  IncredibleEmployment.be  This test is no t yet approved or cleared by the [place] FDA and  has been authorized for detection and/or diagnosis of SARS-CoV-2 by FDA under an  Emergency Use Authorization (EUA). This EUA will remain  in effect (meaning this test can be used) for the duration of the COVID-19 declaration under Section 564(b)(1) of the Act, 21 U.S.C.section 360bbb-3(b)(1), unless the authorization is terminated  or revoked sooner.       Influenza A by PCR NEGATIVE NEGATIVE Final   Influenza B by PCR NEGATIVE NEGATIVE Final    Comment: (NOTE) The Xpert Xpress SARS-CoV-2/FLU/RSV plus assay is intended as an aid in the diagnosis of influenza from Nasopharyngeal swab specimens and should not be used as a sole basis for treatment. Nasal washings and aspirates are unacceptable for Xpert Xpress SARS-CoV-2/FLU/RSV testing.  Fact Sheet for Patients: EntrepreneurPulse.com.au  Fact Sheet for Healthcare Providers: IncredibleEmployment.be  This test is not yet approved or cleared by the [place] FDA and has been authorized for detection and/or diagnosis of SARS-CoV-2 by FDA under an Emergency Use Authorization (EUA). This EUA will remain in effect (meaning  this test can be used) for the duration of the COVID-19 declaration under Section 564(b)(1) of the Act, 21 U.S.C. section 360bbb-3(b)(1), unless the authorization is terminated or revoked.  Performed at [facility], [address] [place], [place] [address]   Aerobic/Anaerobic Culture w Gram Stain (surgical/deep wound)     Status: None (Preliminary result)   Collection Time: 09/04/2021  8:16 AM   Specimen: Wound  Result Value Ref Range Status   Specimen Description   Final    WOUND Performed at [facility], [address] [place], [place] [address]    Special Requests RIGHT FOOT  Final   Gram Stain   Final    MODERATE WBC PRESENT, PREDOMINANTLY MONONUCLEAR MODERATE GRAM POSITIVE COCCI IN PAIRS AND CHAINS MODERATE GRAM NEGATIVE RODS Performed at [facility], [place] [address], [place], [place] [address]    Culture PENDING  Incomplete   Report Status PENDING  Incomplete         Radiology Studies: DG Chest 1 View  Result Date: 09/25/2021 CLINICAL DATA:  Sepsis. EXAM: CHEST  1 VIEW COMPARISON:  None. FINDINGS: The heart size and mediastinal contours are within normal limits. Both lungs are clear. The visualized skeletal structures are unremarkable. IMPRESSION: No active disease. Electronically Signed   By: Marijo Conception M.D.   On: 09/14/2021 07:50   DG Tibia/Fibula Right  Result Date: 09/22/2021 CLINICAL DATA:  Wound. EXAM: RIGHT TIBIA AND FIBULA - 2 VIEW COMPARISON:  None. FINDINGS: There is no evidence of fracture or other focal bone lesions. Soft tissue gas is seen in the ankle and lower calf region concerning for possible cellulitis. IMPRESSION: Soft tissue gas seen in distal calf soft tissues concerning for cellulitis. No definite bony abnormality is noted. Electronically Signed   By: Marijo Conception M.D.   On: 09/22/2021 10:54   DG Ankle Complete Right  Result Date: 09/08/2021 CLINICAL DATA:  Right ankle wound. EXAM: RIGHT ANKLE - COMPLETE 3+  VIEW COMPARISON:  None. FINDINGS: There is no evidence of fracture, dislocation, or joint effusion. There is no evidence of arthropathy or other focal bone abnormality. Status post transmetatarsal amputation of the foot. Soft tissue gas is seen overlying lateral malleolus concerning for cellulitis. No definite lytic destruction is seen to suggest osteomyelitis. IMPRESSION: Soft tissue gas seen overlying lateral malleolus concerning for cellulitis. No definite lytic destruction is seen to suggest osteomyelitis. Electronically Signed   By: Marijo Conception M.D.   On: 09/15/2021 10:52   MR ANKLE RIGHT WO  CONTRAST  Result Date: 09/08/2021 CLINICAL DATA:  Osteomyelitis suspected. Ulcer at fifth metatarsal base, gas on x-rays. EXAM: MRI OF THE RIGHT ANKLE WITHOUT CONTRAST TECHNIQUE: Multiplanar, multisequence MR imaging of the ankle was performed. No intravenous contrast was administered. COMPARISON:  Radiograph dated September 07, 2021 FINDINGS: TENDONS Peroneal: Peroneal longus tendon intact. Peroneal brevis intact. There is fluid tracking along the peroneus longus and brevis concerning for tenosynovitis. Posteromedial: Posterior tibial tendon intact. Flexor hallucis longus tendon intact. Flexor digitorum longus tendon intact. Fluid tracking along the flexor tendons. Anterior: Tibialis anterior tendon intact. Extensor hallucis longus tendon intact Extensor digitorum longus tendon intact. Achilles:  Mild tendinopathy without discrete tear. Plantar Fascia: Normal in size and signal. LIGAMENTS Lateral: Anterior talofibular ligament intact. Calcaneofibular ligament intact. Posterior talofibular ligament intact. Anterior and posterior tibiofibular ligaments intact. Medial: Deltoid ligament intact. Spring ligament intact. CARTILAGE Ankle Joint: No joint effusion. Normal ankle mortise. No chondral defect. Subtalar Joints/Sinus Tarsi: Normal subtalar joints. No subtalar joint effusion. Normal sinus tarsi. Bones: Bone marrow  edema about the base of the fifth metatarsal and cuboid. Normal marrow signal of the distal tibia/fibula, talus. Heterogeneous signal of the calcaneus without evidence of acute fracture. Postsurgical changes for prior amputation through the base of the metatarsals. Soft Tissue: Skin thickening and subcutaneous soft tissue edema. Skin irregularity about the lateral aspect of the base of the fifth metatarsal. IMPRESSION: 1. Bone marrow edema about the base of the fifth metatarsal and cuboid bone, which in the presence of adjacent skin defect is highly suspicious for acute osteomyelitis. 2. Generalized subcutaneous soft tissue edema concerning for cellulitis. No drainable fluid collection or abscess. 3. Fluid tracking along the flexor and peroneal compartments tendon concerning for tenosynovitis. Electronically Signed   By: Keane Police D.O.   On: 09/08/2021 08:24   DG Foot Complete Right  Result Date: 09/27/2021 CLINICAL DATA:  Right foot infection.  Open wound. EXAM: RIGHT FOOT COMPLETE - 3+ VIEW COMPARISON:  May 09, 2021. FINDINGS: Status post transmetatarsal amputation of the foot. Large soft tissue ulceration is seen overlying the residual portion of the proximal fifth metatarsal. There is irregularity involving this bone, but it is uncertain if it represents postsurgical change or possibly osteomyelitis. IMPRESSION: Large soft tissue ulceration is seen overlying the residual portion of proximal fifth metatarsal. There is noted irregularity involving this bone, but it is uncertain if it represents postsurgical change or possibly osteomyelitis. Electronically Signed   By: Marijo Conception M.D.   On: 09/16/2021 07:53   US THYROID  Result Date: 08/30/2021 CLINICAL DATA:  Hypercalcemia EXAM: THYROID ULTRASOUND TECHNIQUE: Ultrasound examination of the thyroid gland and adjacent soft tissues was performed. COMPARISON:  None. FINDINGS: Parenchymal Echotexture: Normal Isthmus: 0.5 cm Right lobe: 4.8 x 2.1 x  2.3 cm Left lobe: 5.8 x 1.5 x 1.5 cm _________________________________________________________ Estimated total number of nodules >/= 1 cm: 0 Number of spongiform nodules >/=  2 cm not described below (TR1): 0 Number of mixed cystic and solid nodules >/= 1.5 cm not described below (TR2): 0 _________________________________________________________ 1.5 x 1.3 x 1.3 cm hypoechoic nodule seen inferior and posterior to the inferior tip of the left thyroid lobe is suspicious for a parathyroid adenoma. IMPRESSION: 1. No significant sonographic abnormality of the thyroid. 2. 1.5 x 1.3 x 1.3 cm hypoechoic mass seen inferior and posterior to the inferior tip of the left thyroid lobe is suspicious for a parathyroid adenoma. The above is in keeping with the ACR TI-RADS recommendations - J Am  Coll Radiol [id number]. Electronically Signed   By: Miachel Roux M.D.   On: 09/01/2021 14:51        Scheduled Meds:  aspirin EC  81 mg Oral Daily   atorvastatin  40 mg Oral QPM   cinacalcet  30 mg Oral Q supper   influenza vac split quadrivalent PF  0.5 mL Intramuscular Tomorrow-1000   insulin aspart  0-15 Units Subcutaneous TID WC   insulin detemir  20 Units Subcutaneous Daily   metoprolol tartrate  12.5 mg Oral BID   pneumococcal 23 valent vaccine  0.5 mL Intramuscular Tomorrow-1000   Continuous Infusions:  ceFEPime (MAXIPIME) IV 200 mL/hr at 09/08/21 0600   heparin 1,800 Units/hr (09/08/21 0751)   metronidazole 500 mg (09/08/21 9935)   vancomycin Stopped (09/08/21 0042)    Assessment & Plan:   Principal Problem:   Sepsis ([place]) Active Problems:   Diabetes mellitus with hyperglycemia ([place])   Hypercalcemia   NSTEMI (non-ST elevated myocardial infarction) ([place])   Nicotine dependence   Alcohol dependence ([place])   Diabetic foot ulcer ([place])   Hyponatremia   Patient is a 55 year old male admitted to the hospital for sepsis from an infected diabetic foot ulcer with acute osteomyelitis         Sepsis  (POA) As evidenced by tachycardia, tachypnea, leukocytosis, lactic acidosis, ulcerated area on the lateral portion of the right foot with purulent  12/11 by with acute osteomyelitis Continue IV antibiotics Follow-up cultures  non-ST elevation MI Cardiology consulted pending Echo pending On heparin drip Continue beta-blockers, aspirin, statins   Acute metabolic encephalopathy 2/2 infection v.s. etoh withdrawal (pt drinks but not sure how much Will place on ciwa protocal incase he is a drinker Obtain ct head Treatment for infection       Diabetes mellitus with complications of hyperglycemia Hold glipizide Follow-up hemoglobin A1c Continue R-ISS       Diabetic foot ulcer with acute osteomyelitis  Podiatry and vascular surgery's input was appreciated  Foot not salvageable Continue IV antibiotics   MRI with acute osteomyelitis of the first metatarsal base and cuboid region X-ray showed gas around the ankle and foot Plan for right foot amputation in a.m.  Need cardiology clearance and echocardiogram.  N.p.o. at midnight tonight        Hypercalcemia Due to primary hyperparathyroidism Nephrology following Will need neck ultrasound to localize parathyroid glands Sensipar started yesterday  Continue IV fluids   nicotine dependence Smoking cessation has been discussed with patient He declines a nicotine transdermal patch at this time       Hyponatremia Most likely secondary to hyperglycemia Expect improvement in patient's serum sodium levels with resolution of hyperglycemia Continue IV fluid hydration Ck levels     DVT prophylaxis: scd Code Status: Full Family Communication: None at bedside Disposition Plan: TBD Status is: Inpatient  Remains inpatient appropriate because: IV treatment.  Plan for surgery tomorrow            LOS: 1 day   Time spent: 35 minutes with more than 50% on [place], MD [facility] Pager [phone]  [phone]  If 7PM-7AM, please contact night-coverage 09/08/2021, 9:23 AM

## 2021-09-08 NOTE — Progress Notes (Signed)
While turning for a bath the pt went into afib with RVR. Pt quickly went out of RVR and converted back to NSR within 10 min. Randol Kern, NP made aware. Will continue to monitor.

## 2021-09-08 NOTE — Consult Note (Signed)
ANTICOAGULATION CONSULT NOTE - Initial Consult  Pharmacy Consult for Heparin infusion Indication: chest pain/ACS  No Known Allergies  Patient Measurements: Height: 6\' 3"  (190.5 cm) Weight: 93 kg (205 lb) IBW/kg (Calculated) : 84.5 Heparin Dosing Weight: 93 kg  Vital Signs: Temp: 98.9 F (37.2 C) (12/10 2259) Temp Source: Oral (12/10 2259) BP: 112/73 (12/10 2259) Pulse Rate: 97 (12/10 2259)  Labs: Recent Labs    09/06/2021 0658 08/29/2021 0659 09/23/2021 0912 09/22/2021 1206 09/16/2021 1444 09/11/2021 2306  HGB 14.4  --   --   --   --   --   HCT 41.1  --   --   --   --   --   PLT 185  --   --   --   --   --   APTT  --  38*  --   --   --   --   LABPROT  --  15.6*  --   --   --   --   INR  --  1.2  --   --   --   --   HEPARINUNFRC  --   --   --   --  <0.10* <0.10*  CREATININE 0.90  --   --   --   --   --   TROPONINIHS 5,943*  --  5,878* 4,796* 4,189*  --      Estimated Creatinine Clearance: 110.8 mL/min (by C-G formula based on SCr of 0.9 mg/dL).   Medical History: Past Medical History:  Diagnosis Date   Diabetes ([place])    Hypertension     Medications:  No prior history of chronic anticoagulation PTA medications  Assessment: Pharmacy has been consulted to initiate heparin infusion in 55yo patient presenting to the ED with right foot pain. Patient denies any chest pain but has troponin levels of 5,943 and 5,878, respectively. Likely demand ischemia d/t sepsis. Baseline labs have been ordered and are pending.  Goal of Therapy:  Heparin level 0.3-0.7 units/ml Monitor platelets by anticoagulation protocol: Yes   Plan:  12/10:  HL @ 2306 = < 0.1, subtherapeutic  Will order heparin 3000 units IV X 1 bolus and increase drip rate to 1800 units/hr.  Will recheck HL 6 hrs after rate change.   Jashun Puertas D 09/08/2021,12:36 AM

## 2021-09-08 NOTE — Plan of Care (Signed)
  Problem: Education: Goal: Knowledge of General Education information will improve Description: Including pain rating scale, medication(s)/side effects and non-pharmacologic comfort measures 09/08/2021 1705 by Emmaline Life, RN Outcome: Progressing 09/08/2021 1705 by Emmaline Life, RN Outcome: Progressing   Problem: Health Behavior/Discharge Planning: Goal: Ability to manage health-related needs will improve Outcome: Progressing

## 2021-09-08 NOTE — Progress Notes (Signed)
Pharmacy Antibiotic Note  [name] is a 55 y.o. male admitted on 09/19/2021. Pharmacy has been consulted for vancomycin and cefepime dosing for diabetic foot infection.  Plan: Renal function is improving, will adjust Vancomycin dose to 1750 mg IV q12h Goal AUC 400-550 Expected AUC: 484.4 SCr used: 0.8  Cefepime 2 g IV q8h   Height: 6\' 3"  (190.5 cm) Weight: 93 kg (205 lb) IBW/kg (Calculated) : 84.5  Temp (24hrs), Avg:98.7 F (37.1 C), Min:98 F (36.7 C), Max:98.9 F (37.2 C)  Recent Labs  Lab 09/15/2021 0658 09/27/2021 0912 09/27/2021 1206 09/23/2021 1444 09/08/21 0616  WBC 11.2*  --   --   --  12.4*  CREATININE 0.90  --   --   --  0.66  LATICACIDVEN 2.9* 2.9* 2.8* 2.8*  --      Estimated Creatinine Clearance: 124.7 mL/min (by C-G formula based on SCr of 0.66 mg/dL).    No Known Allergies  Antimicrobials this admission: Cefepime 12/10 >> Vancomycin 12/10 >>   Microbiology results: 12/10 BCx: NG<24hr 12/10 Cx (right foot): pending  Thank you for allowing pharmacy to be a part of this patient's care.  Pearla Dubonnet, PharmD Clinical Pharmacist 09/08/2021 1:28 PM

## 2021-09-08 NOTE — Consult Note (Signed)
ANTICOAGULATION CONSULT NOTE - Initial Consult  Pharmacy Consult for Heparin infusion Indication: chest pain/ACS  No Known Allergies  Patient Measurements: Height: 6\' 3"  (190.5 cm) Weight: 93 kg (205 lb) IBW/kg (Calculated) : 84.5 Heparin Dosing Weight: 93 kg  Vital Signs: Temp: 99.3 F (37.4 C) (12/11 1956) Temp Source: Oral (12/11 1956) BP: 120/73 (12/11 1956) Pulse Rate: 110 (12/11 1956)  Labs: Recent Labs    09/04/2021 0658 09/25/2021 0659 09/18/2021 0912 09/21/2021 1206 09/16/2021 1444 09/23/2021 1444 09/18/2021 2306 09/08/21 0616 09/08/21 1918  HGB 14.4  --   --   --   --   --   --  12.6*  --   HCT 41.1  --   --   --   --   --   --  36.2*  --   PLT 185  --   --   --   --   --   --  164  --   APTT  --  38*  --   --   --   --   --   --   --   LABPROT  --  15.6*  --   --   --   --   --   --   --   INR  --  1.2  --   --   --   --   --   --   --   HEPARINUNFRC  --   --   --   --  <0.10*   < > <0.10* <0.10* <0.10*  CREATININE 0.90  --   --   --   --   --   --  0.66  --   TROPONINIHS 5,943*  --  5,878* 4,796* 4,189*  --   --   --   --    < > = values in this interval not displayed.     Estimated Creatinine Clearance: 124.7 mL/min (by C-G formula based on SCr of 0.66 mg/dL).   Medical History: Past Medical History:  Diagnosis Date   Diabetes ([place])    Hypertension     Medications:  No prior history of chronic anticoagulation PTA medications  Assessment: Pharmacy has been consulted to initiate heparin infusion in 55yo patient presenting to the ED with right foot pain. Patient denies any chest pain but has troponin levels of 5,943 and 5,878, respectively. Likely demand ischemia d/t sepsis.  Goal of Therapy:  Heparin level 0.3-0.7 units/ml Monitor platelets by anticoagulation protocol: Yes   Plan:  12/11:  HL @ 1918 = < 0.1, subtherapeutic  Will order heparin 3000 units IV X 1 bolus and increase drip rate to 2450 units/hr. Will recheck HL 6 hrs after rate change.    Ladislav Caselli D 09/08/2021,11:04 PM

## 2021-09-08 NOTE — Consult Note (Signed)
ANTICOAGULATION CONSULT NOTE - Initial Consult  Pharmacy Consult for Heparin infusion Indication: chest pain/ACS  No Known Allergies  Patient Measurements: Height: 6\' 3"  (190.5 cm) Weight: 93 kg (205 lb) IBW/kg (Calculated) : 84.5 Heparin Dosing Weight: 93 kg  Vital Signs: Temp: 98.9 F (37.2 C) (12/11 0757) Temp Source: Oral (12/11 0757) BP: 107/74 (12/11 1156) Pulse Rate: 96 (12/11 1156)  Labs: Recent Labs    09/03/2021 0658 09/05/2021 0659 09/06/2021 0912 08/29/2021 1206 09/04/2021 1444 09/03/2021 2306 09/08/21 0616  HGB 14.4  --   --   --   --   --  12.6*  HCT 41.1  --   --   --   --   --  36.2*  PLT 185  --   --   --   --   --  164  APTT  --  38*  --   --   --   --   --   LABPROT  --  15.6*  --   --   --   --   --   INR  --  1.2  --   --   --   --   --   HEPARINUNFRC  --   --   --   --  <0.10* <0.10* <0.10*  CREATININE 0.90  --   --   --   --   --  0.66  TROPONINIHS 5,943*  --  5,878* 4,796* 4,189*  --   --      Estimated Creatinine Clearance: 124.7 mL/min (by C-G formula based on SCr of 0.66 mg/dL).   Medical History: Past Medical History:  Diagnosis Date   Diabetes ([place])    Hypertension     Medications:  No prior history of chronic anticoagulation PTA medications  Assessment: Pharmacy has been consulted to initiate heparin infusion in 55yo patient presenting to the ED with right foot pain. Patient denies any chest pain but has troponin levels of 5,943 and 5,878, respectively. Likely demand ischemia d/t sepsis.  Goal of Therapy:  Heparin level 0.3-0.7 units/ml Monitor platelets by anticoagulation protocol: Yes   Plan:  12/11: HL @ 0616 = < 0.10, subtherapeutic  Will order heparin 3000 units IV X 1 bolus and increase drip rate to 2150 units/hr.  Will recheck HL 6 hrs after rate change.   Athanasios Heldman A Rett Stehlik 09/08/2021,1:21 PM

## 2021-09-08 NOTE — Consult Note (Signed)
Cardiology Consultation:   Patient ID: [name] MRN: [id number]; DOB: 1966-02-21  Admit date: 09/27/2021 Date of Consult: 09/08/2021  PCP:  Merryl Hacker, No   [facility] Providers Cardiologist:  New to [facility] Physician requesting consult: Dr. Kurtis Bushman Reason for consult: Non-STEMI   Patient Profile:   [name] is a 55 y.o. 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 presenting with worsening ulceration of foot, osteomyelitis, In the emergency room started on broad-spectrum antibiotics for sepsis Noted to have elevated troponin greater than 4000, started on heparin infusion December 10 Hypotensive in the emergency room In the emergency room initial troponin 5900 He was a poor historian, Pulse rate on arrival 140 bpm, atrial fibrillation  Notes indicating he lives by himself, works as a Astronomer at [employer], tried to manage the ulcer on his right foot, presenting back to the hospital for nonhealing wound with increased pain  X-ray of the foot showing gas in the soft tissue  Notes indicating December 11 3 AM, episode of atrial fibrillation lasting 10 minutes or so Converting back spontaneously  Notes indicating he has significant Emergency planning/management officer issues, no insurance   Past Medical History:  Diagnosis Date   Diabetes ([place])    Hypertension     Past Surgical History:  Procedure Laterality Date   AMPUTATION TOE Right 07/11/2020   Procedure: AMPUTATION TOE-Right Great Toe;  Surgeon: Samara Deist, DPM;  Location: [facility] ORS;  Service: Podiatry;  Laterality: Right;   I & D EXTREMITY Right 05/14/2021   Procedure: IRRIGATION AND DEBRIDEMENT EXTREMITY- RIGHT FOOT;  Surgeon: Samara Deist, DPM;  Location: [facility] ORS;  Service:  Podiatry;  Laterality: Right;   IRRIGATION AND DEBRIDEMENT ABSCESS Right 10/31/2018   Procedure: IRRIGATION AND DEBRIDEMENT ABSCESS;  Surgeon: Samara Deist, DPM;  Location: [facility] ORS;  Service: Podiatry;  Laterality: Right;   IRRIGATION AND DEBRIDEMENT FOOT Right 11/02/2018   Procedure: IRRIGATION AND DEBRIDEMENT FOOT;  Surgeon: Sharlotte Alamo, DPM;  Location: [facility] ORS;  Service: Podiatry;  Laterality: Right;   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   Procedure: TRANSMETATARSAL AMPUTATION;  Surgeon: Samara Deist, DPM;  Location: [facility] ORS;  Service: Podiatry;  Laterality: Right;     Home Medications:  Prior to Admission medications   Medication Sig Start Date End Date Taking? Authorizing Provider  acetaminophen (TYLENOL) 325 MG tablet Take 2 tablets (650 mg total) by mouth every 6 (six) hours as needed for mild pain (or Fever >/= 101). 05/22/21  Yes Arrien, Jimmy Picket, MD  oxyCODONE (OXY IR/ROXICODONE) 5 MG immediate release tablet Take 1 tablet (5 mg total) by mouth every 6 (six) hours as needed for moderate pain. 05/22/21  Yes Arrien, Jimmy Picket, MD  amoxicillin-clavulanate (AUGMENTIN) 875-125 MG tablet Take 1 tablet by mouth 2 (two) times daily  for 14 days. Patient not taking: Reported on 08/29/2021 06/13/21   Tsosie Billing, MD  atorvastatin (LIPITOR) 40 MG tablet Take 40 mg by mouth daily.    [provider]  glipiZIDE (GLUCOTROL XL) 10 MG 24 hr tablet Take 20 mg by mouth daily with breakfast. Patient not taking: Reported on 09/04/2021    [provider]    Inpatient Medications: Scheduled Meds:  aspirin EC  81 mg Oral Daily   atorvastatin  40 mg Oral QPM   cinacalcet  30 mg Oral Q supper   insulin aspart  0-15 Units Subcutaneous TID WC   insulin detemir  20 Units Subcutaneous Daily   metoprolol tartrate  12.5 mg Oral BID   Continuous Infusions:  ceFEPime (MAXIPIME) IV 2 g (09/08/21 1503)   heparin 2,150 Units/hr (09/08/21 1249)   lactated  ringers 1,000 mL (09/08/21 1500)   metronidazole 500 mg (09/08/21 0620)   vancomycin     PRN Meds: acetaminophen, ondansetron **OR** ondansetron (ZOFRAN) IV, oxyCODONE  Allergies:   No Known Allergies  Social History:   Social History   Socioeconomic History   Marital status: Single    Spouse name: Not on file   Number of children: Not on file   Years of education: Not on file   Highest education level: Not on file  Occupational History   Not on file  Tobacco Use   Smoking status: Light Smoker    Types: Cigars   Smokeless tobacco: Never  Vaping Use   Vaping Use: Never used  Substance and Sexual Activity   Alcohol use: Yes    Comment: "too much"    Drug use: Never   Sexual activity: Yes  Other Topics Concern   Not on file  Social History Narrative   Not on file   Social Determinants of Health   Financial Resource Strain: Not on file  Food Insecurity: Not on file  Transportation Needs: Not on file  Physical Activity: Not on file  Stress: Not on file  Social Connections: Not on file  Intimate Partner Violence: Not on file    Family History:    Family History  Problem Relation Age of Onset   CAD Father    CAD Brother      ROS:  Please see the history of present illness.  Review of Systems  Constitutional: Negative.   HENT: Negative.    Respiratory: Negative.    Cardiovascular: Negative.   Gastrointestinal: Negative.   Musculoskeletal: Negative.   Neurological: Negative.   Psychiatric/Behavioral: Negative.    All other systems reviewed and are negative.  Poor historian, difficulty answering questions above  Physical Exam/Data:   Vitals:   09/08/21 0455 09/08/21 0757 09/08/21 1156 09/08/21 1557  BP: 110/75 112/73 107/74 123/75  Pulse: 90 95 96 (!) 113  Resp: 20     Temp: 98.8 F (37.1 C) 98.9 F (37.2 C)    TempSrc:  Oral    SpO2: 93% 92% 94% 94%  Weight:      Height:        Intake/Output Summary (Last 24 hours) at 09/08/2021 1613 Last  data filed at 09/08/2021 1610 Gross per 24 hour  Intake 2565.59 ml  Output 150 ml  Net 2415.59 ml   Last 3 Weights 09/10/2021 06/13/2021 05/10/2021  Weight (lbs) 205 lb 206 lb 207 lb 14.3 oz  Weight (kg) 92.987 kg 93.441 kg 94.3 kg     Body mass index is 25.62 kg/m.  General: Lethargic, poor historian HEENT: normal Neck: Unable to assess JVD Vascular: No carotid bruits; Distal pulses 2+ bilaterally Cardiac:  normal S1, S2; RRR; no murmur  Lungs: Mildly decreased breath sounds throughout, scattered Rales Abd: soft, nontender, no hepatomegaly  Ext: no edema legs wrapped Musculoskeletal:  No deformities, full exam not performed Skin: warm and dry  Neuro: Unable to test Psych: Lethargic  EKG:  The EKG was personally reviewed and demonstrates:   EKG September 07, 2021 showing atrial fibrillation ventricular rate 150 bpm, consider old anterior MI  Repeat EKG December 10 8 AM showing normal sinus rhythm rate 95 bpm consider old anterior MI, PVCs  Telemetry:  Telemetry was personally reviewed and demonstrates:   Normal sinus rhythm, rare episodes of atrial fibrillation, lasting 10 minutes  Relevant CV Studies: Echocardiogram pending  Laboratory Data:  High Sensitivity Troponin:   Recent Labs  Lab 09/05/2021 0658 09/11/2021 0912 09/18/2021 1206 09/25/2021 1444  TROPONINIHS 5,943* 5,878* 4,796* 4,189*     Chemistry Recent Labs  Lab 08/29/2021 0658 09/24/2021 0912 09/08/21 0616  NA 128*  --  128*  K 4.0  --  3.2*  CL 87*  --  94*  CO2 27  --  29  GLUCOSE 317*  --  126*  BUN 18  --  16  CREATININE 0.90  --  0.66  CALCIUM 13.6*  --  13.3*  MG  --  1.0* 1.6*  GFRNONAA >60  --  >60  ANIONGAP 14  --  5    Recent Labs  Lab 09/20/2021 0658  PROT 6.4*  ALBUMIN 2.6*  AST 33  ALT 23  ALKPHOS 94  BILITOT 3.1*   Lipids No results for input(s): CHOL, TRIG, HDL, LABVLDL, LDLCALC, CHOLHDL in the last 168 hours.  Hematology Recent Labs  Lab 09/21/2021 0658 09/08/21 0616  WBC 11.2*  12.4*  RBC 4.64 4.13*  HGB 14.4 12.6*  HCT 41.1 36.2*  MCV 88.6 87.7  MCH 31.0 30.5  MCHC 35.0 34.8  RDW 11.9 11.9  PLT 185 164   Thyroid No results for input(s): TSH, FREET4 in the last 168 hours.  BNPNo results for input(s): BNP, PROBNP in the last 168 hours.  DDimer No results for input(s): DDIMER in the last 168 hours.   Radiology/Studies:  DG Chest 1 View  Result Date: 09/27/2021 CLINICAL DATA:  Sepsis. EXAM: CHEST  1 VIEW COMPARISON:  None. FINDINGS: The heart size and mediastinal contours are within normal limits. Both lungs are clear. The visualized skeletal structures are unremarkable. IMPRESSION: No active disease. Electronically Signed   By: Marijo Conception M.D.   On: 08/31/2021 07:50   DG Tibia/Fibula Right  Result Date: 09/11/2021 CLINICAL DATA:  Wound. EXAM: RIGHT TIBIA AND FIBULA - 2 VIEW COMPARISON:  None. FINDINGS: There is no evidence of fracture or other focal bone lesions. Soft tissue gas is seen in the ankle and lower calf region concerning for possible cellulitis. IMPRESSION: Soft tissue gas seen in distal calf soft tissues concerning for cellulitis. No definite bony abnormality is noted. Electronically Signed   By: Marijo Conception M.D.   On: 09/19/2021 10:54   DG Ankle Complete Right  Result Date: 09/01/2021 CLINICAL DATA:  Right ankle wound. EXAM: RIGHT ANKLE - COMPLETE 3+ VIEW COMPARISON:  None. FINDINGS: There is no evidence of fracture, dislocation, or joint effusion. There is no evidence of arthropathy or other focal bone abnormality. Status post transmetatarsal amputation of the foot. Soft tissue gas is seen overlying lateral malleolus concerning for cellulitis. No definite lytic destruction is seen to suggest osteomyelitis. IMPRESSION: Soft tissue gas seen overlying lateral malleolus concerning for cellulitis. No definite lytic destruction is seen to suggest osteomyelitis. Electronically Signed   By: Marijo Conception M.D.   On: 09/25/2021 10:52   MR ANKLE  RIGHT WO CONTRAST  Result Date: 09/08/2021 CLINICAL DATA:  Osteomyelitis suspected. Ulcer at fifth metatarsal base, gas on x-rays. EXAM: MRI OF THE RIGHT ANKLE WITHOUT CONTRAST TECHNIQUE: Multiplanar, multisequence MR imaging of the ankle was performed. No intravenous  contrast was administered. COMPARISON:  Radiograph dated September 07, 2021 FINDINGS: TENDONS Peroneal: Peroneal longus tendon intact. Peroneal brevis intact. There is fluid tracking along the peroneus longus and brevis concerning for tenosynovitis. Posteromedial: Posterior tibial tendon intact. Flexor hallucis longus tendon intact. Flexor digitorum longus tendon intact. Fluid tracking along the flexor tendons. Anterior: Tibialis anterior tendon intact. Extensor hallucis longus tendon intact Extensor digitorum longus tendon intact. Achilles:  Mild tendinopathy without discrete tear. Plantar Fascia: Normal in size and signal. LIGAMENTS Lateral: Anterior talofibular ligament intact. Calcaneofibular ligament intact. Posterior talofibular ligament intact. Anterior and posterior tibiofibular ligaments intact. Medial: Deltoid ligament intact. Spring ligament intact. CARTILAGE Ankle Joint: No joint effusion. Normal ankle mortise. No chondral defect. Subtalar Joints/Sinus Tarsi: Normal subtalar joints. No subtalar joint effusion. Normal sinus tarsi. Bones: Bone marrow edema about the base of the fifth metatarsal and cuboid. Normal marrow signal of the distal tibia/fibula, talus. Heterogeneous signal of the calcaneus without evidence of acute fracture. Postsurgical changes for prior amputation through the base of the metatarsals. Soft Tissue: Skin thickening and subcutaneous soft tissue edema. Skin irregularity about the lateral aspect of the base of the fifth metatarsal. IMPRESSION: 1. Bone marrow edema about the base of the fifth metatarsal and cuboid bone, which in the presence of adjacent skin defect is highly suspicious for acute osteomyelitis. 2.  Generalized subcutaneous soft tissue edema concerning for cellulitis. No drainable fluid collection or abscess. 3. Fluid tracking along the flexor and peroneal compartments tendon concerning for tenosynovitis. Electronically Signed   By: Keane Police D.O.   On: 09/08/2021 08:24   DG Foot Complete Right  Result Date: 09/10/2021 CLINICAL DATA:  Right foot infection.  Open wound. EXAM: RIGHT FOOT COMPLETE - 3+ VIEW COMPARISON:  May 09, 2021. FINDINGS: Status post transmetatarsal amputation of the foot. Large soft tissue ulceration is seen overlying the residual portion of the proximal fifth metatarsal. There is irregularity involving this bone, but it is uncertain if it represents postsurgical change or possibly osteomyelitis. IMPRESSION: Large soft tissue ulceration is seen overlying the residual portion of proximal fifth metatarsal. There is noted irregularity involving this bone, but it is uncertain if it represents postsurgical change or possibly osteomyelitis. Electronically Signed   By: Marijo Conception M.D.   On: 09/27/2021 07:53   US THYROID  Result Date: 09/23/2021 CLINICAL DATA:  Hypercalcemia EXAM: THYROID ULTRASOUND TECHNIQUE: Ultrasound examination of the thyroid gland and adjacent soft tissues was performed. COMPARISON:  None. FINDINGS: Parenchymal Echotexture: Normal Isthmus: 0.5 cm Right lobe: 4.8 x 2.1 x 2.3 cm Left lobe: 5.8 x 1.5 x 1.5 cm _________________________________________________________ Estimated total number of nodules >/= 1 cm: 0 Number of spongiform nodules >/=  2 cm not described below (TR1): 0 Number of mixed cystic and solid nodules >/= 1.5 cm not described below (TR2): 0 _________________________________________________________ 1.5 x 1.3 x 1.3 cm hypoechoic nodule seen inferior and posterior to the inferior tip of the left thyroid lobe is suspicious for a parathyroid adenoma. IMPRESSION: 1. No significant sonographic abnormality of the thyroid. 2. 1.5 x 1.3 x 1.3 cm  hypoechoic mass seen inferior and posterior to the inferior tip of the left thyroid lobe is suspicious for a parathyroid adenoma. The above is in keeping with the ACR TI-RADS recommendations - J Am Coll Radiol 2017;14:587-595. Electronically Signed   By: Miachel Roux M.D.   On: 09/06/2021 14:51     Assessment and Plan:   Non-STEMI Elevated troponin on arrival 5900 in the setting of atrial fibrillation  rate 140 bpm on EKG Enzymes trending down since that time EKG concerning for old anterior MI Echocardiogram pending -Not a candidate at this time for ischemic work-up given any intervention would delay surgery on his foot, also given his lethargy, mental status issues -Continue heparin infusion for now Also on aspirin, Lipitor, beta-blocker  Preop cardiovascular evaluation -Given the above, would be high risk for surgery/amputation Likely no choice at this time, will defer to vascular surgery, podiatry -Further guidance after echocardiogram results available  Coronary disease with stable angina Suspect prior anterior MI, presenting with non-STEMI in the setting of atrial fibrillation with RVR  Osteomyelitis, nonhealing foot wound/ulcer On broad-spectrum antibiotics, concern for need for amputation  Atrial fibrillation, paroxysmal In the setting of above stressors, For any recurrent atrial fibrillation would give metoprolol/Lopressor injection 5 mg as needed For frequent episodes, may need antiarrhythmic Will increase metoprolol titrate up to 25 twice daily On heparin infusion  Discussed with hospital service and nursing  Total encounter time more than 110 minutes  Greater than 50% was spent in counseling and coordination of care with the patient   For questions or updates, please contact [facility] Please consult [email or web address] for contact info under    Signed, Ida Rogue, MD  09/08/2021 4:13 PM

## 2021-09-08 NOTE — Progress Notes (Incomplete)
Date and time results received: 09/08/21 0737  (use smartphrase ".now" to insert current time)  Test: Calcium 13.3  Critical Value: 13.3  Name of Provider Notified: Amery    Orders Received? Or Actions Taken?: {ED Critical Value actions [phone]

## 2021-09-08 NOTE — Progress Notes (Signed)
Daily Progress Note   Subjective  - * No surgery found *  Follow-up right foot infection.  Upon entering the room patient was very confused attempting to get out of the bed.  Was not sure where he was.  Was asking to be transported back to the hospital.  Objective Vitals:   09/15/2021 2259 09/08/21 0455 09/08/21 0757 09/08/21 1156  BP: 112/73 110/75 112/73 107/74  Pulse: 97 90 95 96  Resp: 16 20    Temp: 98.9 F (37.2 C) 98.8 F (37.1 C) 98.9 F (37.2 C)   TempSrc: Oral  Oral   SpO2: 94% 93% 92% 94%  Weight:      Height:        Physical Exam: Large amount of purulent drainage on his dressing today.  MRI was consistent with osteomyelitis of the fifth metatarsal base and cuboid region.  X-ray showed gas around the ankle and foot.   Laboratory CBC    Component Value Date/Time   WBC 12.4 (H) 09/08/2021 0616   HGB 12.6 (L) 09/08/2021 0616   HCT 36.2 (L) 09/08/2021 0616   PLT 164 09/08/2021 0616    BMET    Component Value Date/Time   NA 128 (L) 09/08/2021 0616   K 3.2 (L) 09/08/2021 0616   CL 94 (L) 09/08/2021 0616   CO2 29 09/08/2021 0616   GLUCOSE 126 (H) 09/08/2021 0616   BUN 16 09/08/2021 0616   CREATININE 0.66 09/08/2021 0616   CALCIUM 13.3 (HH) 09/08/2021 0616   GFRNONAA >60 09/08/2021 0616   GFRAA >60 06/12/2019 1556    Assessment/Planning: Sepsis status post osteomyelitis right foot Diabetic foot infection Gas producing infection right foot  Vascular has evaluated and agrees this is not a salvageable foot.  I spoke to internal medicine and vascular surgery in regards to the mental status change. Patient will need an amputation of this right lower extremity.  Samara Deist A  09/08/2021, 12:18 PM

## 2021-09-08 NOTE — Plan of Care (Signed)
  Problem: Education: Goal: Knowledge of General Education information will improve Description Including pain rating scale, medication(s)/side effects and non-pharmacologic comfort measures Outcome: Progressing   Problem: Health Behavior/Discharge Planning: Goal: Ability to manage health-related needs will improve Outcome: Progressing   

## 2021-09-08 NOTE — Progress Notes (Signed)
Subjective  -   Groggy, but wakes up to stimulus and is coherent and appropriate   Physical Exam:  No significant erythema above the ankle, but does have edema of the right leg. Draining right foot ulcer       Assessment/Plan:    Discussed with the patient that his foot is not salvageable and that he will require right trans tibial amputation I will plan to get this done tomorrow.  He will be n.p.o. after midnight.  Continue IV antibiotics.  NPO after midnight  Troponins elevated.  Cardiology evaluating  [name] 09/08/2021 3:29 PM --  Vitals:   09/08/21 0757 09/08/21 1156  BP: 112/73 107/74  Pulse: 95 96  Resp:    Temp: 98.9 F (37.2 C)   SpO2: 92% 94%    Intake/Output Summary (Last 24 hours) at 09/08/2021 1529 Last data filed at 09/08/2021 0600 Gross per 24 hour  Intake 1865.53 ml  Output 150 ml  Net 1715.53 ml     Laboratory CBC    Component Value Date/Time   WBC 12.4 (H) 09/08/2021 0616   HGB 12.6 (L) 09/08/2021 0616   HCT 36.2 (L) 09/08/2021 0616   PLT 164 09/08/2021 0616    BMET    Component Value Date/Time   NA 128 (L) 09/08/2021 0616   K 3.2 (L) 09/08/2021 0616   CL 94 (L) 09/08/2021 0616   CO2 29 09/08/2021 0616   GLUCOSE 126 (H) 09/08/2021 0616   BUN 16 09/08/2021 0616   CREATININE 0.66 09/08/2021 0616   CALCIUM 13.3 (HH) 09/08/2021 0616   GFRNONAA >60 09/08/2021 0616   GFRAA >60 06/12/2019 1556    COAG Lab Results  Component Value Date   INR 1.2 09/20/2021   INR 1.2 05/10/2021   INR 1.2 05/09/2021   No results found for: PTT  Antibiotics Anti-infectives (From admission, onward)    Start     Dose/Rate Route Frequency Ordered Stop   09/08/21 2000  vancomycin (VANCOREADY) IVPB 1750 mg/350 mL        1,750 mg 175 mL/hr over 120 Minutes Intravenous Every 12 hours 09/08/21 1328 09/14/21 1959   09/10/2021 2000  vancomycin (VANCOREADY) IVPB 1500 mg/300 mL  Status:  Discontinued        1,500 mg 150 mL/hr over 120  Minutes Intravenous Every 12 hours 09/24/2021 0953 09/08/21 1328   09/27/2021 1800  metroNIDAZOLE (FLAGYL) IVPB 500 mg        500 mg 100 mL/hr over 60 Minutes Intravenous Every 12 hours 09/21/2021 0933 09/14/21 1759   09/04/2021 1400  ceFEPIme (MAXIPIME) 2 g in sodium chloride 0.9 % 100 mL IVPB        2 g 200 mL/hr over 30 Minutes Intravenous Every 8 hours 09/25/2021 0953 09/14/21 1359   09/25/2021 0730  vancomycin (VANCOREADY) IVPB 2000 mg/400 mL        2,000 mg 200 mL/hr over 120 Minutes Intravenous  Once 09/06/2021 0716 09/03/2021 1036   09/02/2021 0715  ceFEPIme (MAXIPIME) 2 g in sodium chloride 0.9 % 100 mL IVPB        2 g 200 mL/hr over 30 Minutes Intravenous  Once 09/20/2021 0702 09/06/2021 0811   09/06/2021 0715  metroNIDAZOLE (FLAGYL) IVPB 500 mg        500 mg 100 mL/hr over 60 Minutes Intravenous  Once 09/19/2021 0702 09/14/2021 0858   09/04/2021 0715  vancomycin (VANCOCIN) IVPB 1000 mg/200 mL premix  Status:  Discontinued  1,000 mg 200 mL/hr over 60 Minutes Intravenous  Once 09/25/2021 0702 09/19/2021 0716        V. Leia Alf, M.D., [facility] [place] Office: [phone] Pager:  [phone]

## 2021-09-08 NOTE — Progress Notes (Signed)
Cardiology puts patient at high risk for surgery given non-STEMI, however, amputation must occur given infection.  ECHO pending to evaluate EF.  Spoke with Hospitalist and we are getting a head CT to further evaluate other explanations for mental status changes.  Patient also being placed on CIWA protocol for history of ETOH.  I will be available to perform his amputation tonight if required, otherwise he is on the schedule for tomorrow  [name]

## 2021-09-08 NOTE — H&P (View-Only) (Signed)
Cardiology puts patient at high risk for surgery given non-STEMI, however, amputation must occur given infection.  ECHO pending to evaluate EF.  Spoke with Hospitalist and we are getting a head CT to further evaluate other explanations for mental status changes.  Patient also being placed on CIWA protocol for history of ETOH.  I will be available to perform his amputation tonight if required, otherwise he is on the schedule for tomorrow  Annamarie Major

## 2021-09-08 NOTE — Progress Notes (Addendum)
[place], [place] 09/08/21  Subjective:   Hospital day # 1  Patient known to our practice from consultation last year.  He states that he lives by himself.  Does not have a PCP.  He works as a Astronomer at [employer].  He was trying to manage his right foot ulcer.  He comes in for evaluation of worsening pain, swelling and infection to his right foot. Patient was evaluated by podiatry service and diagnosed with osteomyelitis.  Lab results severely elevated calcium of 13.6. Review of labs show that patient has had hypercalcemia since at least July 2019.   12/10 0701 - 12/11 0700 In: 2063.7 [P.O.:120; I.V.:1069.7; IV Piggyback:874.1] Out: 150 [Urine:150] Lab Results  Component Value Date   CREATININE 0.66 09/08/2021   CREATININE 0.90 09/06/2021   CREATININE 0.56 (L) 05/22/2021   Update: Patient found resting in bed getting attended by nursing staff.Rt. foot with dressing intact, reports not feeling well. Oral intake appears to be poor.   Objective:  Vital signs in last 24 hours:  Temp:  [98 F (36.7 C)-98.9 F (37.2 C)] 98.9 F (37.2 C) (12/11 0757) Pulse Rate:  [88-99] 96 (12/11 1156) Resp:  [16-25] 20 (12/11 0455) BP: (99-118)/(66-88) 107/74 (12/11 1156) SpO2:  [92 %-100 %] 94 % (12/11 1156)  Weight change:  Filed Weights   09/20/2021 0648  Weight: 93 kg    Intake/Output:    Intake/Output Summary (Last 24 hours) at 09/08/2021 1408 Last data filed at 09/08/2021 0600 Gross per 24 hour  Intake 1865.53 ml  Output 150 ml  Net 1715.53 ml      Physical Exam: General: No acute distress,ill appearing  HEENT Normocephalic, atraumatic,oral mucous membranes dry  Pulm/lungs Respirations symmetrical,unlabored, lungs clear  CVS/Heart S1S2, no rubs or gallops  Abdomen:  Soft, nontender  Extremities: Right foot with dressing clean, dry and intact, 1+edema, Left leg-no swelling  Neurologic: Alert, able to answer question 'yes' or 'no',  appears slightly confused  Skin: Right foot with ulcer,erythema noted on Rt lower leg          Basic Metabolic Panel:  Recent Labs  Lab 09/28/2021 0658 09/02/2021 0912 09/08/21 0616  NA 128*  --  128*  K 4.0  --  3.2*  CL 87*  --  94*  CO2 27  --  29  GLUCOSE 317*  --  126*  BUN 18  --  16  CREATININE 0.90  --  0.66  CALCIUM 13.6*  --  13.3*  MG  --  1.0* 1.6*      CBC: Recent Labs  Lab 09/05/2021 0658 09/08/21 0616  WBC 11.2* 12.4*  NEUTROABS 10.6*  --   HGB 14.4 12.6*  HCT 41.1 36.2*  MCV 88.6 87.7  PLT 185 164      No results found for: HEPBSAG, HEPBSAB, HEPBIGM    Microbiology:  Recent Results (from the past 240 hour(s))  Culture, blood (routine x 2)     Status: None (Preliminary result)   Collection Time: 09/19/2021  6:58 AM   Specimen: BLOOD  Result Value Ref Range Status   Specimen Description BLOOD LEFT FA  Final   Special Requests   Final    BOTTLES DRAWN AEROBIC AND ANAEROBIC Blood Culture adequate volume   Culture   Final    NO GROWTH < 24 HOURS Performed at [facility], [address], [place], [place] [address]    Report Status PENDING  Incomplete  Culture, blood (routine x  2)     Status: None (Preliminary result)   Collection Time: 09/25/2021  6:59 AM   Specimen: BLOOD  Result Value Ref Range Status   Specimen Description BLOOD RIGHT FA  Final   Special Requests   Final    BOTTLES DRAWN AEROBIC AND ANAEROBIC Blood Culture adequate volume   Culture   Final    NO GROWTH < 24 HOURS Performed at [facility], [address], [place], [place] [address]    Report Status PENDING  Incomplete  Resp Panel by RT-PCR (Flu A&B, Covid) Nasopharyngeal Swab     Status: None   Collection Time: 08/31/2021  7:44 AM   Specimen: Nasopharyngeal Swab; Nasopharyngeal(NP) swabs in vial transport medium  Result Value Ref Range Status   SARS Coronavirus 2 by RT PCR NEGATIVE NEGATIVE Final    Comment: (NOTE) SARS-CoV-2 target nucleic acids are  NOT DETECTED.  The SARS-CoV-2 RNA is generally detectable in upper respiratory specimens during the acute phase of infection. The lowest concentration of SARS-CoV-2 viral copies this assay can detect is 138 copies/mL. A negative result does not preclude SARS-Cov-2 infection and should not be used as the sole basis for treatment or other patient management decisions. A negative result may occur with  improper specimen collection/handling, submission of specimen other than nasopharyngeal swab, presence of viral mutation(s) within the areas targeted by this assay, and inadequate number of viral copies(<138 copies/mL). A negative result must be combined with clinical observations, patient history, and epidemiological information. The expected result is Negative.  Fact Sheet for Patients:  EntrepreneurPulse.com.au  Fact Sheet for Healthcare Providers:  IncredibleEmployment.be  This test is no t yet approved or cleared by the [place] FDA and  has been authorized for detection and/or diagnosis of SARS-CoV-2 by FDA under an Emergency Use Authorization (EUA). This EUA will remain  in effect (meaning this test can be used) for the duration of the COVID-19 declaration under Section 564(b)(1) of the Act, 21 U.S.C.section 360bbb-3(b)(1), unless the authorization is terminated  or revoked sooner.       Influenza A by PCR NEGATIVE NEGATIVE Final   Influenza B by PCR NEGATIVE NEGATIVE Final    Comment: (NOTE) The Xpert Xpress SARS-CoV-2/FLU/RSV plus assay is intended as an aid in the diagnosis of influenza from Nasopharyngeal swab specimens and should not be used as a sole basis for treatment. Nasal washings and aspirates are unacceptable for Xpert Xpress SARS-CoV-2/FLU/RSV testing.  Fact Sheet for Patients: EntrepreneurPulse.com.au  Fact Sheet for Healthcare Providers: IncredibleEmployment.be  This test is not  yet approved or cleared by the [place] FDA and has been authorized for detection and/or diagnosis of SARS-CoV-2 by FDA under an Emergency Use Authorization (EUA). This EUA will remain in effect (meaning this test can be used) for the duration of the COVID-19 declaration under Section 564(b)(1) of the Act, 21 U.S.C. section 360bbb-3(b)(1), unless the authorization is terminated or revoked.  Performed at [facility], [address] [place], [place] [address]   Aerobic/Anaerobic Culture w Gram Stain (surgical/deep wound)     Status: None (Preliminary result)   Collection Time: 09/08/2021  8:16 AM   Specimen: Wound  Result Value Ref Range Status   Specimen Description   Final    WOUND Performed at [facility], [address] [place], [place] [address]    Special Requests RIGHT FOOT  Final   Gram Stain   Final    MODERATE WBC PRESENT, PREDOMINANTLY MONONUCLEAR MODERATE GRAM POSITIVE COCCI  IN PAIRS AND CHAINS MODERATE GRAM NEGATIVE RODS    Culture   Final    TOO YOUNG TO READ Performed at [facility], [place] [address], [place], [name] [address]    Report Status PENDING  Incomplete    Coagulation Studies: Recent Labs    09/08/2021 0659  LABPROT 15.6*  INR 1.2    Urinalysis: Recent Labs    09/24/2021 1206  COLORURINE YELLOW*  LABSPEC 1.023  PHURINE 6.0  GLUCOSEU >=500*  HGBUR SMALL*  BILIRUBINUR NEGATIVE  KETONESUR 20*  PROTEINUR NEGATIVE  NITRITE NEGATIVE  LEUKOCYTESUR NEGATIVE       Imaging: DG Chest 1 View  Result Date: 09/10/2021 CLINICAL DATA:  Sepsis. EXAM: CHEST  1 VIEW COMPARISON:  None. FINDINGS: The heart size and mediastinal contours are within normal limits. Both lungs are clear. The visualized skeletal structures are unremarkable. IMPRESSION: No active disease. Electronically Signed   By: Marijo Conception M.D.   On: 09/08/2021 07:50   DG Tibia/Fibula Right  Result Date: 08/31/2021 CLINICAL DATA:  Wound. EXAM: RIGHT  TIBIA AND FIBULA - 2 VIEW COMPARISON:  None. FINDINGS: There is no evidence of fracture or other focal bone lesions. Soft tissue gas is seen in the ankle and lower calf region concerning for possible cellulitis. IMPRESSION: Soft tissue gas seen in distal calf soft tissues concerning for cellulitis. No definite bony abnormality is noted. Electronically Signed   By: Marijo Conception M.D.   On: 09/06/2021 10:54   DG Ankle Complete Right  Result Date: 09/11/2021 CLINICAL DATA:  Right ankle wound. EXAM: RIGHT ANKLE - COMPLETE 3+ VIEW COMPARISON:  None. FINDINGS: There is no evidence of fracture, dislocation, or joint effusion. There is no evidence of arthropathy or other focal bone abnormality. Status post transmetatarsal amputation of the foot. Soft tissue gas is seen overlying lateral malleolus concerning for cellulitis. No definite lytic destruction is seen to suggest osteomyelitis. IMPRESSION: Soft tissue gas seen overlying lateral malleolus concerning for cellulitis. No definite lytic destruction is seen to suggest osteomyelitis. Electronically Signed   By: Marijo Conception M.D.   On: 09/18/2021 10:52   MR ANKLE RIGHT WO CONTRAST  Result Date: 09/08/2021 CLINICAL DATA:  Osteomyelitis suspected. Ulcer at fifth metatarsal base, gas on x-rays. EXAM: MRI OF THE RIGHT ANKLE WITHOUT CONTRAST TECHNIQUE: Multiplanar, multisequence MR imaging of the ankle was performed. No intravenous contrast was administered. COMPARISON:  Radiograph dated September 07, 2021 FINDINGS: TENDONS Peroneal: Peroneal longus tendon intact. Peroneal brevis intact. There is fluid tracking along the peroneus longus and brevis concerning for tenosynovitis. Posteromedial: Posterior tibial tendon intact. Flexor hallucis longus tendon intact. Flexor digitorum longus tendon intact. Fluid tracking along the flexor tendons. Anterior: Tibialis anterior tendon intact. Extensor hallucis longus tendon intact Extensor digitorum longus tendon intact.  Achilles:  Mild tendinopathy without discrete tear. Plantar Fascia: Normal in size and signal. LIGAMENTS Lateral: Anterior talofibular ligament intact. Calcaneofibular ligament intact. Posterior talofibular ligament intact. Anterior and posterior tibiofibular ligaments intact. Medial: Deltoid ligament intact. Spring ligament intact. CARTILAGE Ankle Joint: No joint effusion. Normal ankle mortise. No chondral defect. Subtalar Joints/Sinus Tarsi: Normal subtalar joints. No subtalar joint effusion. Normal sinus tarsi. Bones: Bone marrow edema about the base of the fifth metatarsal and cuboid. Normal marrow signal of the distal tibia/fibula, talus. Heterogeneous signal of the calcaneus without evidence of acute fracture. Postsurgical changes for prior amputation through the base of the metatarsals. Soft Tissue: Skin thickening and subcutaneous soft tissue edema. Skin irregularity about the  lateral aspect of the base of the fifth metatarsal. IMPRESSION: 1. Bone marrow edema about the base of the fifth metatarsal and cuboid bone, which in the presence of adjacent skin defect is highly suspicious for acute osteomyelitis. 2. Generalized subcutaneous soft tissue edema concerning for cellulitis. No drainable fluid collection or abscess. 3. Fluid tracking along the flexor and peroneal compartments tendon concerning for tenosynovitis. Electronically Signed   By: Keane Police D.O.   On: 09/08/2021 08:24   DG Foot Complete Right  Result Date: 09/04/2021 CLINICAL DATA:  Right foot infection.  Open wound. EXAM: RIGHT FOOT COMPLETE - 3+ VIEW COMPARISON:  May 09, 2021. FINDINGS: Status post transmetatarsal amputation of the foot. Large soft tissue ulceration is seen overlying the residual portion of the proximal fifth metatarsal. There is irregularity involving this bone, but it is uncertain if it represents postsurgical change or possibly osteomyelitis. IMPRESSION: Large soft tissue ulceration is seen overlying the residual  portion of proximal fifth metatarsal. There is noted irregularity involving this bone, but it is uncertain if it represents postsurgical change or possibly osteomyelitis. Electronically Signed   By: Marijo Conception M.D.   On: 09/08/2021 07:53   US THYROID  Result Date: 09/18/2021 CLINICAL DATA:  Hypercalcemia EXAM: THYROID ULTRASOUND TECHNIQUE: Ultrasound examination of the thyroid gland and adjacent soft tissues was performed. COMPARISON:  None. FINDINGS: Parenchymal Echotexture: Normal Isthmus: 0.5 cm Right lobe: 4.8 x 2.1 x 2.3 cm Left lobe: 5.8 x 1.5 x 1.5 cm _________________________________________________________ Estimated total number of nodules >/= 1 cm: 0 Number of spongiform nodules >/=  2 cm not described below (TR1): 0 Number of mixed cystic and solid nodules >/= 1.5 cm not described below (TR2): 0 _________________________________________________________ 1.5 x 1.3 x 1.3 cm hypoechoic nodule seen inferior and posterior to the inferior tip of the left thyroid lobe is suspicious for a parathyroid adenoma. IMPRESSION: 1. No significant sonographic abnormality of the thyroid. 2. 1.5 x 1.3 x 1.3 cm hypoechoic mass seen inferior and posterior to the inferior tip of the left thyroid lobe is suspicious for a parathyroid adenoma. The above is in keeping with the ACR TI-RADS recommendations - J Am Coll Radiol 2017;14:587-595. Electronically Signed   By: Miachel Roux M.D.   On: 09/23/2021 14:51     Medications:    ceFEPime (MAXIPIME) IV 200 mL/hr at 09/08/21 0600   heparin 2,150 Units/hr (09/08/21 1249)   metronidazole 500 mg (09/08/21 0620)   vancomycin      aspirin EC  81 mg Oral Daily   atorvastatin  40 mg Oral QPM   cinacalcet  30 mg Oral Q supper   insulin aspart  0-15 Units Subcutaneous TID WC   insulin detemir  20 Units Subcutaneous Daily   metoprolol tartrate  12.5 mg Oral BID   acetaminophen, ondansetron **OR** ondansetron (ZOFRAN) IV, oxyCODONE  Assessment/ Plan:  55 y.o. 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, history of hypercalcemia  admitted on 09/11/2021 for Hypercalcemia [E83.52] NSTEMI (non-ST elevated myocardial infarction) ([place]) [I21.4] Ulcerated, foot, right, with necrosis of bone ([place]) [L97.514] Sepsis ([place]) [A41.9] Ulcer of right foot, unspecified ulcer stage ([place]) [L97.519] Acute sepsis ([place]) [A41.9] And evaluation of painful ulcer over the lateral portion of her right foot with increased purulent drainage  Hypercalcemia, likely primary hyperparathyroidism Lab Results  Component Value Date   PTH 79 (H) 09/05/2021   CALCIUM 13.3 (HH) 09/08/2021   PHOS 1.9 (L) 07/12/2020  Elevated PTH CXR - neg for findings of sarcoid Thyroid studies normal in 06/2020 SPEP neg for myeloma in 06/2020 Likely Primary Hyperparathyroidism Will need neck ulrasound to localize Parathyroid glands  Sensipar started yesterday Will restart IV fluid LR    2.  Right diabetic foot ulcer/osteomyelitis Patient has been evaluated by podiatrist and  Vascular surgery recommended transtibial amputation MRI pending He is on IV antibiotics   #Hypokalemia Potassium 3. 2 today Getting treated with [place] one time dose    LOS: 1 [name] 12/11/20222:08 PM  [place], [place]  Note: This note was prepared with Dragon dictation. Any transcription errors are unintentional

## 2021-09-09 ENCOUNTER — Encounter: Admission: EM | Disposition: E | Payer: Self-pay | Source: Home / Self Care | Attending: Internal Medicine

## 2021-09-09 ENCOUNTER — Inpatient Hospital Stay: Payer: Medicaid Other | Admitting: Anesthesiology

## 2021-09-09 ENCOUNTER — Inpatient Hospital Stay: Payer: Medicaid Other

## 2021-09-09 ENCOUNTER — Inpatient Hospital Stay (HOSPITAL_COMMUNITY)
Admit: 2021-09-09 | Discharge: 2021-09-09 | Disposition: A | Discharge status: Home / Self Care | Payer: Medicaid Other | Attending: Internal Medicine | Admitting: Internal Medicine

## 2021-09-09 DIAGNOSIS — I96 Gangrene, not elsewhere classified: Secondary | ICD-10-CM

## 2021-09-09 DIAGNOSIS — I214 Non-ST elevation (NSTEMI) myocardial infarction: Secondary | ICD-10-CM

## 2021-09-09 DIAGNOSIS — E876 Hypokalemia: Secondary | ICD-10-CM

## 2021-09-09 HISTORY — PX: APPLICATION OF WOUND VAC: SHX5189

## 2021-09-09 HISTORY — PX: AMPUTATION: SHX166

## 2021-09-09 LAB — BASIC METABOLIC PANEL
Anion gap: 5 (ref 5–15)
BUN: 16 mg/dL (ref 6–20)
CO2: 28 mmol/L (ref 22–32)
Calcium: 13.6 mg/dL (ref 8.9–10.3)
Chloride: 100 mmol/L (ref 98–111)
Creatinine, Ser: 0.62 mg/dL (ref 0.61–1.24)
GFR, Estimated: >60 mL/min (ref >60)
Glucose, Bld: 94 mg/dL (ref 70–99)
Potassium: 3 mmol/L — ABNORMAL LOW (ref 3.5–5.1)
Sodium: 133 mmol/L — ABNORMAL LOW (ref 135–145)

## 2021-09-09 LAB — ECHOCARDIOGRAM COMPLETE
AR max vel: 3.24 cm2
AV Area VTI: 3.26 cm2
AV Area mean vel: 3.21 cm2
AV Mean grad: 3 mmHg
AV Peak grad: 5.1 mmHg
Ao pk vel: 1.13 m/s
Area-P 1/2: 4.74 cm2
Height: 75 in
MV VTI: 3 cm2
S' Lateral: 4.2 cm
Weight: 3280 oz

## 2021-09-09 LAB — BLOOD GAS, ARTERIAL
Acid-Base Excess: 6.6 mmol/L — ABNORMAL HIGH (ref 0.0–2.0)
Bicarbonate: 30.5 mmol/L — ABNORMAL HIGH (ref 20.0–28.0)
FIO2: 0.21
O2 Saturation: 95 %
Patient temperature: 37
pCO2 arterial: 40 mmHg (ref 32.0–48.0)
pH, Arterial: 7.49 — ABNORMAL HIGH (ref 7.350–7.450)
pO2, Arterial: 69 mmHg — ABNORMAL LOW (ref 83.0–108.0)

## 2021-09-09 LAB — URINE CULTURE: Culture: NO GROWTH

## 2021-09-09 LAB — GLUCOSE, CAPILLARY
Glucose-Capillary: 102 mg/dL — ABNORMAL HIGH (ref 70–99)
Glucose-Capillary: 109 mg/dL — ABNORMAL HIGH (ref 70–99)
Glucose-Capillary: 114 mg/dL — ABNORMAL HIGH (ref 70–99)
Glucose-Capillary: 125 mg/dL — ABNORMAL HIGH (ref 70–99)
Glucose-Capillary: 129 mg/dL — ABNORMAL HIGH (ref 70–99)
Glucose-Capillary: 136 mg/dL — ABNORMAL HIGH (ref 70–99)
Glucose-Capillary: 150 mg/dL — ABNORMAL HIGH (ref 70–99)
Glucose-Capillary: 155 mg/dL — ABNORMAL HIGH (ref 70–99)

## 2021-09-09 LAB — HEMOGLOBIN A1C
Hgb A1c MFr Bld: 8.2 % — ABNORMAL HIGH (ref 4.8–5.6)
Mean Plasma Glucose: 189 mg/dL

## 2021-09-09 LAB — PHOSPHORUS: Phosphorus: 1.6 mg/dL — ABNORMAL LOW (ref 2.5–4.6)

## 2021-09-09 LAB — HEPARIN LEVEL (UNFRACTIONATED)
Heparin Unfractionated: 0.1 IU/mL — ABNORMAL LOW (ref 0.30–0.70)
Heparin Unfractionated: <0.1 IU/mL — ABNORMAL LOW (ref 0.30–0.70)

## 2021-09-09 LAB — CBC
HCT: 34.9 % — ABNORMAL LOW (ref 39.0–52.0)
Hemoglobin: 11.9 g/dL — ABNORMAL LOW (ref 13.0–17.0)
MCH: 30.4 pg (ref 26.0–34.0)
MCHC: 34.1 g/dL (ref 30.0–36.0)
MCV: 89.3 fL (ref 80.0–100.0)
Platelets: 153 10*3/uL (ref 150–400)
RBC: 3.91 MIL/uL — ABNORMAL LOW (ref 4.22–5.81)
RDW: 12.1 % (ref 11.5–15.5)
WBC: 8.2 10*3/uL (ref 4.0–10.5)
nRBC: 0 % (ref 0.0–0.2)

## 2021-09-09 LAB — PROTIME-INR
INR: 1.6 — ABNORMAL HIGH (ref 0.8–1.2)
Prothrombin Time: 18.9 seconds — ABNORMAL HIGH (ref 11.4–15.2)

## 2021-09-09 LAB — MAGNESIUM: Magnesium: 1.5 mg/dL — ABNORMAL LOW (ref 1.7–2.4)

## 2021-09-09 LAB — MRSA NEXT GEN BY PCR, NASAL: MRSA by PCR Next Gen: NOT DETECTED

## 2021-09-09 LAB — LACTIC ACID, PLASMA: Lactic Acid, Venous: 1.3 mmol/L (ref 0.5–1.9)

## 2021-09-09 SURGERY — AMPUTATION BELOW KNEE
Anesthesia: General | Site: Leg Lower | Laterality: Right

## 2021-09-09 MED ORDER — ETOMIDATE 2 MG/ML IV SOLN
INTRAVENOUS | Status: DC | PRN
  Administered 2021-09-09: 20 mg via INTRAVENOUS

## 2021-09-09 MED ORDER — HEPARIN BOLUS VIA INFUSION
3000.0000 [IU] | Freq: Once | INTRAVENOUS | Status: AC
  Administered 2021-09-09: 3000 [IU] via INTRAVENOUS
  Filled 2021-09-09: qty 3000

## 2021-09-09 MED ORDER — OXYCODONE HCL 5 MG/5ML PO SOLN: 5.0000 mg | Freq: Once | ORAL | Status: DC | PRN

## 2021-09-09 MED ORDER — SUCCINYLCHOLINE CHLORIDE 200 MG/10ML IV SOSY
PREFILLED_SYRINGE | INTRAVENOUS | Status: AC
  Filled 2021-09-09: qty 10

## 2021-09-09 MED ORDER — ACETAMINOPHEN 325 MG PO TABS
650.0000 mg | ORAL_TABLET | Freq: Four times a day (QID) | ORAL | Status: DC | PRN
  Administered 2021-09-12 – 2021-09-13 (×2): 650 mg
  Filled 2021-09-09 (×2): qty 2

## 2021-09-09 MED ORDER — ROCURONIUM BROMIDE 10 MG/ML (PF) SYRINGE
PREFILLED_SYRINGE | INTRAVENOUS | Status: AC
  Filled 2021-09-09: qty 10

## 2021-09-09 MED ORDER — FENTANYL CITRATE (PF) 100 MCG/2ML IJ SOLN
INTRAMUSCULAR | Status: AC
  Filled 2021-09-09: qty 2

## 2021-09-09 MED ORDER — ETOMIDATE 2 MG/ML IV SOLN
INTRAVENOUS | Status: AC
  Filled 2021-09-09: qty 10

## 2021-09-09 MED ORDER — LORAZEPAM 2 MG/ML IJ SOLN: 0.0000 mg | Freq: Three times a day (TID) | INTRAMUSCULAR | Status: DC

## 2021-09-09 MED ORDER — ENOXAPARIN SODIUM 40 MG/0.4ML IJ SOSY
40.0000 mg | PREFILLED_SYRINGE | INTRAMUSCULAR | Status: DC
Start: 2021-09-10 — End: 2021-09-13
  Administered 2021-09-10 – 2021-09-13 (×4): 40 mg via SUBCUTANEOUS
  Filled 2021-09-09 (×4): qty 0.4

## 2021-09-09 MED ORDER — ADULT MULTIVITAMIN W/MINERALS CH
1.0000 | ORAL_TABLET | Freq: Every day | ORAL | Status: DC
  Administered 2021-09-10 – 2021-09-13 (×4): 1
  Filled 2021-09-09 (×4): qty 1

## 2021-09-09 MED ORDER — LIDOCAINE HCL (CARDIAC) PF 100 MG/5ML IV SOSY
PREFILLED_SYRINGE | INTRAVENOUS | Status: DC | PRN
  Administered 2021-09-09: 60 mg via INTRAVENOUS

## 2021-09-09 MED ORDER — GLUCERNA SHAKE PO LIQD: 237.0000 mL | Freq: Every day | ORAL | Status: DC

## 2021-09-09 MED ORDER — THIAMINE HCL 100 MG/ML IJ SOLN
500.0000 mg | Freq: Three times a day (TID) | INTRAVENOUS | Status: AC
  Administered 2021-09-10 – 2021-09-11 (×5): 500 mg via INTRAVENOUS
  Filled 2021-09-09 (×6): qty 5

## 2021-09-09 MED ORDER — THIAMINE HCL 100 MG/ML IJ SOLN
250.0000 mg | Freq: Every day | INTRAVENOUS | Status: DC
  Administered 2021-09-12 – 2021-09-13 (×2): 250 mg via INTRAVENOUS
  Filled 2021-09-09 (×2): qty 2.5

## 2021-09-09 MED ORDER — SODIUM CHLORIDE 0.9 % IV SOLN: INTRAVENOUS | Status: DC

## 2021-09-09 MED ORDER — ENOXAPARIN SODIUM 40 MG/0.4ML IJ SOSY: 40.0000 mg | PREFILLED_SYRINGE | INTRAMUSCULAR | Status: DC

## 2021-09-09 MED ORDER — ONDANSETRON HCL 4 MG/2ML IJ SOLN
INTRAMUSCULAR | Status: AC
  Filled 2021-09-09: qty 2

## 2021-09-09 MED ORDER — DEXTROSE 5 % IV SOLN: INTRAVENOUS | Status: DC

## 2021-09-09 MED ORDER — OXYCODONE HCL 5 MG PO TABS: 5.0000 mg | ORAL_TABLET | Freq: Once | ORAL | Status: DC | PRN

## 2021-09-09 MED ORDER — INSULIN ASPART 100 UNIT/ML IJ SOLN
0.0000 [IU] | INTRAMUSCULAR | Status: DC
  Administered 2021-09-10 (×3): 2 [IU] via SUBCUTANEOUS
  Administered 2021-09-10: 23:00:00 5 [IU] via SUBCUTANEOUS
  Administered 2021-09-11: 03:00:00 3 [IU] via SUBCUTANEOUS
  Administered 2021-09-11: 20:00:00 5 [IU] via SUBCUTANEOUS
  Administered 2021-09-11 (×2): 8 [IU] via SUBCUTANEOUS
  Administered 2021-09-11: 09:00:00 5 [IU] via SUBCUTANEOUS
  Administered 2021-09-11: 8 [IU] via SUBCUTANEOUS
  Administered 2021-09-12 (×3): 3 [IU] via SUBCUTANEOUS
  Administered 2021-09-12 (×2): 2 [IU] via SUBCUTANEOUS
  Administered 2021-09-12: 5 [IU] via SUBCUTANEOUS
  Administered 2021-09-13: 15 [IU] via SUBCUTANEOUS
  Administered 2021-09-13 (×2): 5 [IU] via SUBCUTANEOUS
  Filled 2021-09-09 (×17): qty 1

## 2021-09-09 MED ORDER — SODIUM CHLORIDE 0.9 % IV SOLN
60.0000 mg | Freq: Once | INTRAVENOUS | Status: AC
  Administered 2021-09-09: 60 mg via INTRAVENOUS
  Filled 2021-09-09: qty 6.67

## 2021-09-09 MED ORDER — MAGNESIUM SULFATE 2 GM/50ML IV SOLN
2.0000 g | Freq: Once | INTRAVENOUS | Status: AC
  Administered 2021-09-09: 2 g via INTRAVENOUS
  Filled 2021-09-09: qty 50

## 2021-09-09 MED ORDER — PHENYLEPHRINE 40 MCG/ML (10ML) SYRINGE FOR IV PUSH (FOR BLOOD PRESSURE SUPPORT)
PREFILLED_SYRINGE | INTRAVENOUS | Status: DC | PRN
Start: 2021-09-09 — End: 2021-09-09
  Administered 2021-09-09 (×2): 80 ug via INTRAVENOUS
  Administered 2021-09-09: 240 ug via INTRAVENOUS
  Administered 2021-09-09 (×3): 160 ug via INTRAVENOUS

## 2021-09-09 MED ORDER — LORAZEPAM 2 MG/ML IJ SOLN: 1.0000 mg | INTRAMUSCULAR | Status: DC | PRN

## 2021-09-09 MED ORDER — MORPHINE SULFATE (PF) 2 MG/ML IV SOLN
2.0000 mg | INTRAVENOUS | Status: DC | PRN
  Administered 2021-09-09 – 2021-09-12 (×3): 2 mg via INTRAVENOUS
  Filled 2021-09-09 (×3): qty 1

## 2021-09-09 MED ORDER — SODIUM CHLORIDE FLUSH 0.9 % IV SOLN
INTRAVENOUS | Status: AC
  Filled 2021-09-09: qty 10

## 2021-09-09 MED ORDER — FOLIC ACID 1 MG PO TABS
1.0000 mg | ORAL_TABLET | Freq: Every day | ORAL | Status: DC
  Administered 2021-09-10 – 2021-09-13 (×4): 1 mg
  Filled 2021-09-09 (×4): qty 1

## 2021-09-09 MED ORDER — CHLORHEXIDINE GLUCONATE CLOTH 2 % EX PADS
6.0000 | MEDICATED_PAD | Freq: Every day | CUTANEOUS | Status: DC
  Administered 2021-09-09 – 2021-09-13 (×4): 6 via TOPICAL

## 2021-09-09 MED ORDER — PHENYLEPHRINE HCL-NACL 20-0.9 MG/250ML-% IV SOLN
INTRAVENOUS | Status: AC
  Filled 2021-09-09: qty 250

## 2021-09-09 MED ORDER — FENTANYL CITRATE (PF) 100 MCG/2ML IJ SOLN: 25.0000 ug | INTRAMUSCULAR | Status: DC | PRN

## 2021-09-09 MED ORDER — LORAZEPAM 1 MG PO TABS: 1.0000 mg | ORAL_TABLET | ORAL | Status: DC | PRN

## 2021-09-09 MED ORDER — OXYCODONE HCL 5 MG PO TABS
5.0000 mg | ORAL_TABLET | Freq: Four times a day (QID) | ORAL | Status: DC | PRN
  Administered 2021-09-10: 16:00:00 5 mg
  Filled 2021-09-09: qty 1

## 2021-09-09 MED ORDER — SODIUM CHLORIDE 0.9 % IV SOLN: INTRAVENOUS | Status: DC | PRN

## 2021-09-09 MED ORDER — SODIUM CHLORIDE 0.9 % IV SOLN
60.0000 mg | Freq: Once | INTRAVENOUS | Status: DC
  Filled 2021-09-09: qty 20

## 2021-09-09 MED ORDER — ATORVASTATIN CALCIUM 20 MG PO TABS
40.0000 mg | ORAL_TABLET | Freq: Every evening | ORAL | Status: DC
Start: 2021-09-10 — End: 2021-09-13
  Administered 2021-09-10 – 2021-09-12 (×3): 40 mg
  Filled 2021-09-09 (×3): qty 2

## 2021-09-09 MED ORDER — THIAMINE HCL 100 MG/ML IJ SOLN: 100.0000 mg | Freq: Every day | INTRAMUSCULAR | Status: DC

## 2021-09-09 MED ORDER — SODIUM CHLORIDE 0.9 % IV SOLN
INTRAVENOUS | Status: DC
Start: 2021-09-09 — End: 2021-09-09

## 2021-09-09 MED ORDER — ENSURE MAX PROTEIN PO LIQD
11.0000 [oz_av] | Freq: Two times a day (BID) | ORAL | Status: DC
  Filled 2021-09-09 (×2): qty 330

## 2021-09-09 MED ORDER — POTASSIUM CHLORIDE 10 MEQ/100ML IV SOLN
10.0000 meq | INTRAVENOUS | Status: DC
  Administered 2021-09-09: 10 meq via INTRAVENOUS
  Filled 2021-09-09 (×4): qty 100

## 2021-09-09 MED ORDER — 0.9 % SODIUM CHLORIDE (POUR BTL) OPTIME
TOPICAL | Status: DC | PRN
  Administered 2021-09-09: 1000 mL
  Administered 2021-09-09: 500 mL

## 2021-09-09 MED ORDER — LORAZEPAM 2 MG/ML IJ SOLN
0.0000 mg | INTRAMUSCULAR | Status: DC
  Administered 2021-09-10: 2 mg via INTRAVENOUS
  Filled 2021-09-09: qty 1

## 2021-09-09 MED ORDER — DEXTROSE-NACL 5-0.9 % IV SOLN: INTRAVENOUS | Status: DC

## 2021-09-09 MED ORDER — METOPROLOL TARTRATE 25 MG PO TABS
25.0000 mg | ORAL_TABLET | Freq: Two times a day (BID) | ORAL | Status: DC
  Administered 2021-09-10 – 2021-09-13 (×4): 25 mg
  Filled 2021-09-09 (×6): qty 1

## 2021-09-09 MED ORDER — FENTANYL CITRATE (PF) 100 MCG/2ML IJ SOLN
INTRAMUSCULAR | Status: DC | PRN
  Administered 2021-09-09 (×4): 50 ug via INTRAVENOUS

## 2021-09-09 MED ORDER — ONDANSETRON HCL 4 MG PO TABS: 4.0000 mg | ORAL_TABLET | Freq: Four times a day (QID) | ORAL | Status: DC | PRN

## 2021-09-09 MED ORDER — POTASSIUM CHLORIDE 20 MEQ PO PACK: 40.0000 meq | PACK | Freq: Once | ORAL | Status: DC

## 2021-09-09 MED ORDER — K PHOS MONO-SOD PHOS DI & MONO 155-852-130 MG PO TABS
500.0000 mg | ORAL_TABLET | ORAL | Status: DC
  Filled 2021-09-09 (×4): qty 2

## 2021-09-09 MED ORDER — SUCCINYLCHOLINE CHLORIDE 200 MG/10ML IV SOSY
PREFILLED_SYRINGE | INTRAVENOUS | Status: DC | PRN
  Administered 2021-09-09: 100 mg via INTRAVENOUS

## 2021-09-09 MED ORDER — ONDANSETRON HCL 4 MG/2ML IJ SOLN
INTRAMUSCULAR | Status: DC | PRN
  Administered 2021-09-09: 4 mg via INTRAVENOUS

## 2021-09-09 MED ORDER — ONDANSETRON HCL 4 MG/2ML IJ SOLN: 4.0000 mg | Freq: Four times a day (QID) | INTRAMUSCULAR | Status: DC | PRN

## 2021-09-09 SURGICAL SUPPLY — 43 items
BLADE SAGITTAL WIDE XTHICK NO (BLADE) IMPLANT
BLADE SAW SAG 25.4X90 (BLADE) ×2 IMPLANT
BNDG COHESIVE 4X5 TAN ST LF (GAUZE/BANDAGES/DRESSINGS) ×2 IMPLANT
BNDG ELASTIC 6X5.8 VLCR NS LF (GAUZE/BANDAGES/DRESSINGS) ×2 IMPLANT
BNDG GAUZE ELAST 4 BULKY (GAUZE/BANDAGES/DRESSINGS) ×4 IMPLANT
BRUSH SCRUB EZ  4% CHG (MISCELLANEOUS) ×1
BRUSH SCRUB EZ 4% CHG (MISCELLANEOUS) ×1 IMPLANT
CANISTER WOUND CARE 500ML ATS (WOUND CARE) ×1 IMPLANT
CHLORAPREP W/TINT 26 (MISCELLANEOUS) ×2 IMPLANT
DRAPE INCISE IOBAN 66X45 STRL (DRAPES) IMPLANT
DRSG VAC ATS MED SENSATRAC (GAUZE/BANDAGES/DRESSINGS) ×1 IMPLANT
ELECT CAUTERY BLADE 6.4 (BLADE) ×2 IMPLANT
ELECT REM PT RETURN 9FT ADLT (ELECTROSURGICAL) ×2
ELECTRODE REM PT RTRN 9FT ADLT (ELECTROSURGICAL) ×1 IMPLANT
GAUZE 4X4 16PLY ~~LOC~~+RFID DBL (SPONGE) ×2 IMPLANT
GAUZE XEROFORM 1X8 LF (GAUZE/BANDAGES/DRESSINGS) ×4 IMPLANT
GLOVE SURG ENC MOIS LTX SZ7 (GLOVE) ×2 IMPLANT
GLOVE SURG SYN 7.0 (GLOVE) ×2 IMPLANT
GLOVE SURG SYN 7.0 PF PI (GLOVE) ×1 IMPLANT
GLOVE SURG UNDER LTX SZ7.5 (GLOVE) ×2 IMPLANT
GOWN STRL REUS W/ TWL LRG LVL3 (GOWN DISPOSABLE) ×1 IMPLANT
GOWN STRL REUS W/ TWL XL LVL3 (GOWN DISPOSABLE) ×2 IMPLANT
GOWN STRL REUS W/TWL LRG LVL3 (GOWN DISPOSABLE) ×1
GOWN STRL REUS W/TWL XL LVL3 (GOWN DISPOSABLE) ×2
HANDLE YANKAUER SUCT BULB TIP (MISCELLANEOUS) ×2 IMPLANT
KIT TURNOVER KIT A (KITS) ×2 IMPLANT
LABEL OR SOLS (LABEL) ×2 IMPLANT
MANIFOLD NEPTUNE II (INSTRUMENTS) ×2 IMPLANT
NS IRRIG 1000ML POUR BTL (IV SOLUTION) ×2 IMPLANT
PACK EXTREMITY ARMC (MISCELLANEOUS) ×2 IMPLANT
PAD ABD DERMACEA PRESS 5X9 (GAUZE/BANDAGES/DRESSINGS) ×4 IMPLANT
PAD PREP 24X41 OB/GYN DISP (PERSONAL CARE ITEMS) ×2 IMPLANT
SPONGE T-LAP 18X18 ~~LOC~~+RFID (SPONGE) ×2 IMPLANT
STAPLER SKIN PROX 35W (STAPLE) ×2 IMPLANT
STOCKINETTE M/LG 89821 (MISCELLANEOUS) ×2 IMPLANT
SUT SILK 2 0 (SUTURE) ×1
SUT SILK 2 0 SH (SUTURE) ×4 IMPLANT
SUT SILK 2-0 18XBRD TIE 12 (SUTURE) ×1 IMPLANT
SUT SILK 3 0 (SUTURE) ×1
SUT SILK 3-0 18XBRD TIE 12 (SUTURE) ×1 IMPLANT
SUT VIC AB 0 CT1 36 (SUTURE) ×4 IMPLANT
SUT VIC AB 2-0 CT1 (SUTURE) ×4 IMPLANT
WATER STERILE IRR 500ML POUR (IV SOLUTION) ×2 IMPLANT

## 2021-09-09 NOTE — Anesthesia Procedure Notes (Signed)
Procedure Name: Intubation Date/Time: 09/23/2021 10:28 AM Performed by: Jonna Clark, CRNA Pre-anesthesia Checklist: Patient identified, Patient being monitored, Timeout performed, Emergency Drugs available and Suction available Patient Re-evaluated:Patient Re-evaluated prior to induction Oxygen Delivery Method: Circle system utilized Preoxygenation: Pre-oxygenation with 100% oxygen Induction Type: IV induction Ventilation: Mask ventilation without difficulty Laryngoscope Size: 4 and Mac Grade View: Grade I Tube type: Oral Tube size: 7.0 mm Number of attempts: 1 Airway Equipment and Method: Stylet Placement Confirmation: ETT inserted through vocal cords under direct vision, positive ETCO2 and breath sounds checked- equal and bilateral Secured at: 22 cm Tube secured with: Tape Dental Injury: Teeth and Oropharynx as per pre-operative assessment

## 2021-09-09 NOTE — Interval H&P Note (Signed)
History and Physical Interval Note:  09/05/2021 9:56 AM  [name]  has presented today for surgery, with the diagnosis of Sepsis of Right Foot.  The various methods of treatment have been discussed with the patient and family. After consideration of risks, benefits and other options for treatment, the patient has consented to  Procedure(s): AMPUTATION BELOW KNEE (Right) as a surgical intervention.  This was discussed with the patient when his mental status was improved prior to today.  He has had deterioration of his mental status which may very well be due to infection as well as alcohol withdrawal and other issues.  As the foot may be the source of his infection and clearly needs to have an amputation, I would deem this emergent at this point and needs to be proceeded with below-knee amputation.  The patient's history has been reviewed, patient examined, no change in status, stable for surgery.  I have reviewed the patient's chart and labs.  Questions were answered to the patient's satisfaction.     Leotis Pain

## 2021-09-09 NOTE — Transfer of Care (Signed)
Immediate Anesthesia Transfer of Care Note  Patient: [name]  Procedure(s) Performed: RIGHT OPEN GUILLOTINE BELOW KNEE AMPUTATION (Right: Leg Lower) APPLICATION OF WOUND VAC (Right: Leg Lower)  Patient Location: PACU  Anesthesia Type:General  Level of Consciousness: drowsy and patient cooperative  Airway & Oxygen Therapy: Patient Spontanous Breathing and Patient connected to face mask oxygen  Post-op Assessment: Report given to RN and Post -op Vital signs reviewed and stable  Post vital signs: Reviewed and stable  Last Vitals:  Vitals Value Taken Time  BP 96/71 09/16/2021 1127  Temp    Pulse 82 09/14/2021 1127  Resp 24 09/06/2021 1127  SpO2 100 % 09/10/2021 1127  Vitals shown include unvalidated device data.  Last Pain:  Vitals:   09/04/2021 0326  TempSrc: Oral  PainSc:       Patients Stated Pain Goal: 0 (52/84/13 2440)  Complications: No notable events documented.

## 2021-09-09 NOTE — Anesthesia Preprocedure Evaluation (Addendum)
Anesthesia Evaluation  Patient identified by MRN, date of birth, ID bandGeneral Assessment Comment:obtunded   Reviewed: Allergy & Precautions, NPO status , Patient's Chart, lab work & pertinent test results  History of Anesthesia Complications Negative for: history of anesthetic complications  Airway Mallampati: III  TM Distance: >3 FB Neck ROM: full    Dental  (+) Chipped, Poor Dentition, Missing   Pulmonary Current Smoker and Patient abstained from smoking.,     + decreased breath sounds      Cardiovascular hypertension, + Past MI  + dysrhythmias Atrial Fibrillation  Rhythm:irregular Rate:Tachycardia     Neuro/Psych negative neurological ROS  negative psych ROS   GI/Hepatic negative GI ROS, Neg liver ROS,   Endo/Other  diabetes, Poorly Controlled, Type 2  Renal/GU      Musculoskeletal   Abdominal   Peds  Hematology negative hematology ROS (+)   Anesthesia Other Findings Patient obtunded which is though to be 2/2 sepsis and ETOH abuse / withdrawal.  Head CT was negative.  Septic, NSTEMI, patient cleared to proceed by cardiology as this was deemed an emergency.  Past Medical History: No date: Diabetes ([place]) No date: Hypertension  Past Surgical History: 07/11/2020: AMPUTATION TOE; Right     Comment:  Procedure: AMPUTATION TOE-Right Great Toe;  Surgeon:               Samara Deist, DPM;  Location: [facility] ORS;  Service:               Podiatry;  Laterality: Right; 05/14/2021: I & D EXTREMITY; Right     Comment:  Procedure: IRRIGATION AND DEBRIDEMENT EXTREMITY- RIGHT               FOOT;  Surgeon: Samara Deist, DPM;  Location: [facility] ORS;              Service: Podiatry;  Laterality: Right; 10/31/2018: IRRIGATION AND DEBRIDEMENT ABSCESS; Right     Comment:  Procedure: [id number] AND DEBRIDEMENT ABSCESS;  Surgeon:              Samara Deist, DPM;  Location: [facility] ORS;  Service:               Podiatry;  Laterality:  Right; 11/02/2018: IRRIGATION AND DEBRIDEMENT FOOT; Right     Comment:  Procedure: IRRIGATION AND DEBRIDEMENT FOOT;  Surgeon:               Sharlotte Alamo, DPM;  Location: [facility] ORS;  Service:               Podiatry;  Laterality: Right; 05/10/2021: TRANSMETATARSAL AMPUTATION; Right     Comment:  Procedure: TRANSMETATARSAL AMPUTATION;  Surgeon: Samara Deist, DPM;  Location: [facility] ORS;  Service: Podiatry;                Laterality: Right;  BMI    Body Mass Index: 25.62 kg/m      Reproductive/Obstetrics negative OB ROS                            Anesthesia Physical Anesthesia Plan  ASA: 5 and emergent  Anesthesia Plan: General ETT   Post-op Pain Management:    Induction: Intravenous  PONV Risk Score and Plan: Ondansetron, Dexamethasone, Midazolam and Treatment may vary due to age or medical condition  Airway Management Planned: Oral ETT  Additional Equipment:   Intra-op Plan:  Post-operative Plan: Possible Post-op intubation/ventilation  Informed Consent: I have reviewed the patients History and Physical, chart, labs and discussed the procedure including the risks, benefits and alternatives for the proposed anesthesia with the patient or authorized representative who has indicated his/her understanding and acceptance.     Dental Advisory Given  Plan Discussed with: Anesthesiologist, CRNA and Surgeon  Anesthesia Plan Comments: (Patient is obtunded and has no family to consent.  Emergency 2 physician consent used to proceed as the case was deemed an emergency.)        Anesthesia Quick Evaluation

## 2021-09-09 NOTE — Consult Note (Addendum)
NAME:  [name], MRN:  [id number], DOB:  [birth year], LOS: 2 ADMISSION DATE:  09/01/2021, CONSULTATION DATE:  09/27/2021 REFERRING MD:  Dr. Kurtis Bushman, CHIEF COMPLAINT:   Rt Foot pain  History of Present Illness:  (History obtained through chart review & prior documentation as patient is altered and unable to participate at this time) 55 year old male presenting to [facility] ED on 09/04/2021 with complaints of feeling tired over the past day in the setting of of a wound on the lateral aspect of his right foot with foul-smelling drainage and increased pain. 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  Post surgery he followed up with the podiatrist once on 07/09/2021 at which time he had a 2 x 2 full-thickness ulcer without signs of infection.  ED course: Upon arrival patient was tachycardic in A-fib RVR, tachypneic with elevated lactic acid and procalcitonin levels> he was worked up per sepsis protocol with IV fluid and empiric antibiotic therapy.  There was also concern for possible NSTEMI and a heparin drip was started. upon admission patient denied any fever, chills, chest pain, shortness of breath, nausea/vomiting/abdominal pain, urinary symptoms, leg swelling/palpitations/diaphoresis or focal deficits.  Patient documented as a poor historian with some mental status deterioration. Significant labs /imaging (on admission):  CXR negative for infection, right foot x-ray showed large soft tissue ulceration overlying the residual portion of the proximal fifth metatarsal.  Was also noted irregularity involving the bone, which was uncertain to represent postsurgical change versus possible osteomyelitis.  TRH was consulted for admission. Hyponatremic-128, glucose 317, calcium 13.6, albumin 2.6, elevated troponin 5943, PCT 15.8, mild leukocytosis at 11.2.  COVID and flu negative.  Hospital course: Podiatry consulted whose recommendations were vascular surgical intervention for potential  amputation of RLE due to the concern for osteomyelitis & x-rays revealing soft tissue gas. Vascular surgery recommended right trans tibial amputation, which was performed 09/18/2021. Cardiology following due to N-STEMI, HFrEF and PAF on heparin drip with plans for ischemic work-up once stabilized from surgery & infection. Nephrology consulted for guidance on hypercalcemia, and ID following to assist with treatment of R ft infection. Patient continues to be altered in the setting of ETOH use & infection. [place] 09/08/21: negative for acute abnormality. Post-op the patient was lethargic & hypotensive after receiving morphine & ativan and transferred to SDU.  PCCM later consulted to assist with management and monitoring.  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 Podiatry & vascular consulted 09/08/21: Pt confused, CTH negative, CIWA initiated. Cardiology consulted for management of N-STEMI & A-fib RVR with plans for ischemic w/u after vascular intervention and pt stabilization 09/04/2021: status post guillotine R BKA, in post op patient lethargic & hypotensive transferred to SDU. PCCM consulted.  Interim History / Subjective:  Patient similar to post-op description, somnolent responsive to pain & physical stimuli, moving all extremities with some groaning. VSS.  Objective   Blood pressure 112/78, pulse 99, temperature (!) 101.1 F (38.4 C), temperature source Rectal, resp. rate (!) 38, height 6\' 3"  (1.905 m), weight 94.8 kg, SpO2 94 %.        Intake/Output Summary (Last 24 hours) at 09/25/2021 2256 Last data filed at 08/29/2021 1625 Gross per 24 hour  Intake  2914.57 ml  Output 660 ml  Net 2254.57 ml   Filed Weights    09/24/2021 0648 09/08/2021 1848  Weight: 93 kg 94.8 kg    Examination: General: Adult male, acutely ill, lying in bed, somnolent, NAD HEENT: MM pink/moist, anicteric, atraumatic, neck supple Neuro: Somnolent, RASS -4, unable to follow commands, PERRL +3, MAE CV: s1s2 RRR, NSR on monitor, no r/m/g Pulm: Regular, non labored on 2 L [place], breath sounds clear-BUL & diminished-BLL GI: soft, rounded, non tender, bs x 4 GU: external catheter in place with clear amber urine Skin: dressed R BKA, scattered ecchymosis Extremities: warm/dry, pulses + 2 R/P (LLE), RLE +2 popliteal pulse, trace  edema noted  Resolved Hospital Problem list     Assessment & Plan:  Acute Encephalopathy secondary to suspected withdrawal in the setting of ETOH use & infection CTH 12/11 negative, STAT ABG: 7.49/40/69/30.5, Lactic: 1.3 - CIWA Q 4, Ativan per CIWA protocol - Folic acid, multi-vitamin daily - high dose thiamine taper - limit sedating medications as tolerated, consider librium/precedex if patient becomes agitated - seizure & fall precautions - NGT placed to facilitate PO medications - HIGH RISK for intubation > currently protecting airway - f/u ammonia level & LFT's, consider MRI wo contrast if patient's mental status does not improve  Sepsis without septic shock secondary to acute osteomyelitis in the setting of 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 Lactic: 1.3 - Daily CBC, monitor WBC/ fever curve - IV antibiotics: zosyn & vancomycin * per ID note will need prolonged course due to PMHx of osteomyelitis - continue IVF hydration: NS @ 75 mL/h - Strict I/O's: alert provider if UOP < 0.5 mL/kg/hr - ID consulted, appreciate input - vascular following, appreciate input  HFrEF/ Presumed ICM- LVEF 30-35%, severe hypokinesis N-Stemi- troponin peak 5943 PAF- currently NSR - continue metoprolol, statin, ASA via NGT - if A-fib recurs consider amiodarone - continuous cardiac monitoring - Cardiology  consulted, appreciate input > plan for ischemic w/u and cath once stabilized  Hypercalcemia s/t Hyperparathyroidism 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 - Sensipar started, only 1 dose received due to lethargy, unable to give via NGT > Rx looking into alternative options for NGT administration - continue IVF - Nephrology following, appreciate input  Uncontrolled Type 2 Diabetes Mellitus Hemoglobin A1C: 8.2 - Monitor CBG Q 4 hours while NPO - SSI on hold while PO intake poor: CBG's 100-150 (12/12) - target range while in ICU: 140-180 - follow ICU hyper/hypo-glycemia protocol  Best Practice (right click and "Reselect all SmartList Selections" daily)  Diet/type: NPO w/ meds via tube DVT prophylaxis: LMWH GI prophylaxis: PPI Lines: N/A Foley:  N/A Code Status:  full code Last date of multidisciplinary goals of care discussion [09/23/2021]  Labs   CBC: Recent Labs  Lab 09/21/2021 0658 09/08/21 0616 09/15/2021 0259  WBC 11.2* 12.4* 8.2  NEUTROABS 10.6*  --   --   HGB 14.4 12.6* 11.9*  HCT 41.1 36.2* 34.9*  MCV 88.6 87.7 89.3  PLT 185 164 409    Basic Metabolic Panel: Recent Labs  Lab 09/05/2021 0658 09/17/2021 0912 09/08/21 0616 09/08/21 1918 09/16/2021 0259  NA 128*  --  128* 132* 133*  K 4.0  --  3.2*  --  3.0*  CL 87*  --  94*  --  100  CO2 27  --  29  --  28  GLUCOSE 317*  --  126*  --  94  BUN 18  --  16  --  16  CREATININE 0.90  --  0.66  --  0.62  CALCIUM 13.6*  --  13.3*  --  13.6*  MG  --  1.0* 1.6*  --  1.5*  PHOS  --   --   --   --  1.6*   GFR: Estimated Creatinine Clearance: 124.7 mL/min (by C-G formula based on SCr of 0.62 mg/dL). Recent Labs  Lab 09/22/2021 0658 09/01/2021 0912 09/08/2021 1206 09/19/2021 1444 09/08/21 0616 09/27/2021 0259 09/18/2021 1935  PROCALCITON 15.80  --   --   --  8.58  --   --   WBC 11.2*  --   --   --  12.4* 8.2  --   LATICACIDVEN 2.9* 2.9* 2.8* 2.8*  --   --  1.3    Liver Function Tests: Recent Labs  Lab 09/22/2021 0658  AST 33   ALT 23  ALKPHOS 94  BILITOT 3.1*  PROT 6.4*  ALBUMIN 2.6*   No results for input(s): LIPASE, AMYLASE in the last 168 hours. No results for input(s): AMMONIA in the last 168 hours.  ABG    Component Value Date/Time   PHART 7.49 (H) 09/11/2021 1910   PCO2ART 40 09/02/2021 1910   PO2ART 69 (L) 09/01/2021 1910   HCO3 30.5 (H) 09/05/2021 1910   O2SAT 95.0 09/03/2021 1910     Coagulation Profile: Recent Labs  Lab 09/05/2021 0659 09/21/2021 0259  INR 1.2 1.6*    Cardiac Enzymes: No results for input(s): CKTOTAL, CKMB, CKMBINDEX, TROPONINI in the last 168 hours.  HbA1C: Hgb A1c MFr Bld  Date/Time Value Ref Range Status  09/22/2021 06:58 AM 8.2 (H) 4.8 - 5.6 % Final    Comment:    (NOTE)         Prediabetes: 5.7 - 6.4         Diabetes: >6.4         Glycemic control for adults with diabetes: <7.0   05/10/2021 05:00 AM 6.4 (H) 4.8 - 5.6 % Final    Comment:    (NOTE) Pre diabetes:          5.7%-6.4%  Diabetes:              >6.4%  Glycemic control for   <7.0% adults with diabetes     CBG: Recent Labs  Lab 09/28/2021 0830 09/18/2021 1119 09/03/2021 1543 09/05/2021 1851 09/26/2021 2117  GLUCAP 129* 155* 150* 125* 114*    Review of Systems:   UTA- patient somnolent unable to participate in interview  Past Medical History:  He,  has a past medical history of Diabetes ([place]) and Hypertension.   Surgical History:   Past Surgical History:  Procedure Laterality Date   AMPUTATION TOE Right 07/11/2020   Procedure: AMPUTATION TOE-Right Great Toe;  Surgeon: Samara Deist, DPM;  Location: [facility] ORS;  Service: Podiatry;  Laterality: Right;   I & D EXTREMITY Right 05/14/2021   Procedure: IRRIGATION AND DEBRIDEMENT EXTREMITY- RIGHT FOOT;  Surgeon: Samara Deist, DPM;  Location: [facility] ORS;  Service: Podiatry;  Laterality: Right;   IRRIGATION AND DEBRIDEMENT ABSCESS Right 10/31/2018   Procedure: IRRIGATION AND DEBRIDEMENT ABSCESS;  Surgeon: Samara Deist, DPM;  Location: [facility] ORS;   Service: Podiatry;  Laterality: Right;   IRRIGATION AND DEBRIDEMENT FOOT Right 11/02/2018   Procedure: IRRIGATION AND DEBRIDEMENT FOOT;  Surgeon: Sharlotte Alamo, DPM;  Location: [facility] ORS;  Service: Podiatry;  Laterality: Right;   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   Procedure: TRANSMETATARSAL AMPUTATION;  Surgeon: Samara Deist, DPM;  Location: [facility] ORS;  Service: Podiatry;  Laterality: Right;     Social History:   reports that he has been smoking cigars. He has never used smokeless tobacco. He reports current alcohol use. He reports that he does not use drugs.   Family History:  His family history includes CAD in his brother and father.   Allergies No Known Allergies   Home Medications  Prior to Admission medications   Medication Sig Start Date End Date Taking? Authorizing Provider  acetaminophen (TYLENOL) 325 MG tablet Take 2 tablets (650 mg total) by mouth every 6 (six) hours as needed for mild pain (or Fever >/= 101). 05/22/21  Yes Arrien, Jimmy Picket, MD  oxyCODONE (OXY IR/ROXICODONE) 5 MG immediate release tablet Take 1 tablet (5 mg total) by mouth every 6 (six) hours as needed for moderate pain. 05/22/21  Yes Arrien, Jimmy Picket, MD  amoxicillin-clavulanate (AUGMENTIN) 875-125 MG tablet Take 1 tablet by mouth 2 (two) times daily  for 14 days. Patient not taking: Reported on 09/06/2021 06/13/21   Tsosie Billing, MD  atorvastatin (LIPITOR) 40 MG tablet Take 40 mg by mouth daily.    [provider]  glipiZIDE (GLUCOTROL XL) 10 MG 24 hr tablet Take 20 mg by mouth daily with breakfast. Patient not taking: Reported on 09/27/2021    [provider]     Critical care time: 55 minutes       Venetia Night, AGACNP-BC Acute Care Nurse Practitioner [place]   [phone] / [phone] Please see Amion for pager details.

## 2021-09-09 NOTE — Progress Notes (Signed)
Initial Nutrition Assessment  DOCUMENTATION CODES:   Not applicable  INTERVENTION:   Once diet is advanced:  -Glucerna Shake po daily, each supplement provides 220 kcal and 10 grams of protein  -Ensure Max po BID, each supplement provides 150 kcal and 30 grams of protein.   -MVI with minerals daily -Magic cup TID with meals, each supplement provides 290 kcal and 9 grams of protein   NUTRITION DIAGNOSIS:   Increased nutrient needs related to wound healing, post-op healing as evidenced by estimated needs.  GOAL:   Patient will meet greater than or equal to 90% of their needs  MONITOR:   PO intake, Supplement acceptance, Diet advancement, Labs, Weight trends, Skin, I & O's  REASON FOR ASSESSMENT:   Consult Wound healing  ASSESSMENT:   [name] is a 55 y.o. 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  Pt admitted with sepsis secondary infected rt foot DM ulcer and r/o osteomyelitis.   Reviewed I/O's: +3.4 L x 24 hours and +5.4 L since admission  UOP: 510 ml x 24 hours   Per MD notes, pt does not have a PCP. Per vascular surgery, pt's foot is not salvageable and will require rt BKA. Pt with osteomyelitis and gas gangrene.    Pt currently NPO. Pt previously on a carb modified diet, with meal completions 0%. Per chart review, pt with confusion yesterday and was placed on CIWA.   Reviewed wt hx; wt has been stable over the past 3 months.   Medications reviewed and include folic acid, thiamine, and dextrose 5%-0.9% sodium chloride infusion @ 50 ml/hr.   Lab Results  Component Value Date   HGBA1C 8.2 (H) 09/11/2021   PTA DM medications are 20 mg glucotrol daily.   Labs reviewed: Na: 133, K: 3.0, CBGS: 63-129 (inpatient orders for  glycemic control are 0-15 units insulin aspart TID with meals).    Diet Order:   Diet Order             Diet NPO time specified  Diet effective midnight                   EDUCATION NEEDS:   No education needs have been identified at this time  Skin:  Skin Assessment: Skin Integrity Issues: Skin Integrity Issues:: Diabetic Ulcer Diabetic Ulcer: rt foot  Last BM:  Unknown  Height:   Ht Readings from Last 1 Encounters:  08/30/2021 6\' 3"  (1.905 m)    Weight:   Wt Readings from Last 1 Encounters:  09/06/2021 93 kg    Ideal Body Weight:  83.3 kg (adjusted for rt BKA)  BMI:  Body mass index is 25.62 kg/m.  Estimated Nutritional Needs:   Kcal:  2400-2600  Protein:  125-150 grams  Fluid:  > 2 L    Loistine Chance, RD, LDN, [place] Registered Dietitian II Certified Diabetes Care and Education Specialist Please refer to [facility] for RD and/or RD on-call/weekend/after hours pager

## 2021-09-09 NOTE — Consult Note (Signed)
ANTICOAGULATION CONSULT NOTE - Initial Consult  Pharmacy Consult for Heparin infusion Indication: chest pain/ACS  No Known Allergies  Patient Measurements: Height: 6\' 3"  (190.5 cm) Weight: 93 kg (205 lb) IBW/kg (Calculated) : 84.5 Heparin Dosing Weight: 93 kg  Vital Signs: Temp: 98.7 F (37.1 C) (12/12 0326) Temp Source: Oral (12/12 0326) BP: 97/66 (12/12 0326) Pulse Rate: 87 (12/12 0326)  Labs: Recent Labs    09/09/2021 0658 09/28/2021 0659 09/06/2021 0912 09/05/2021 1206 09/28/2021 1444 09/27/2021 2306 09/08/21 0616 09/08/21 1918 09/15/2021 0259  HGB 14.4  --   --   --   --   --  12.6*  --  11.9*  HCT 41.1  --   --   --   --   --  36.2*  --  34.9*  PLT 185  --   --   --   --   --  164  --  153  APTT  --  38*  --   --   --   --   --   --   --   LABPROT  --  15.6*  --   --   --   --   --   --  18.9*  INR  --  1.2  --   --   --   --   --   --  1.6*  HEPARINUNFRC  --   --   --   --  <0.10*   < > <0.10* <0.10* 0.10*  CREATININE 0.90  --   --   --   --   --  0.66  --  0.62  TROPONINIHS 5,943*  --  5,878* 4,796* 4,189*  --   --   --   --    < > = values in this interval not displayed.     Estimated Creatinine Clearance: 124.7 mL/min (by C-G formula based on SCr of 0.62 mg/dL).   Medical History: Past Medical History:  Diagnosis Date   Diabetes ([place])    Hypertension     Medications:  No prior history of chronic anticoagulation PTA medications  Assessment: Pharmacy has been consulted to initiate heparin infusion in 55yo patient presenting to the ED with right foot pain. Patient denies any chest pain but has troponin levels of 5,943 and 5,878, respectively. Likely demand ischemia d/t sepsis.  Goal of Therapy:  Heparin level 0.3-0.7 units/ml Monitor platelets by anticoagulation protocol: Yes   Plan:  12/12:  HL @ 0259 = 0.10, SUBtherapeutic  Will order heparin 3000 units IV X 1 bolus and increase drip rate to 2750 units/hr.  Will recheck HL 6 hrs after rate change.    Atticus Wedin D 09/02/2021,4:57 AM

## 2021-09-09 NOTE — Progress Notes (Signed)
Progress Note  Patient Name: [name] Date of Encounter: 09/17/2021  Primary Cardiologist: Ida Rogue, MD  Subjective   Seen in preop.  Pt remains lethargic, intermittently grunting and repositioning himself in bed.  Does not respond to verbal stimuli/does not follow directions.  Inpatient Medications    Scheduled Meds:  [MAR Hold] aspirin EC  81 mg Oral Daily   [MAR Hold] atorvastatin  40 mg Oral QPM   [MAR Hold] cinacalcet  30 mg Oral Q supper   [MAR Hold] folic acid  1 mg Oral Daily   [MAR Hold] insulin aspart  0-15 Units Subcutaneous TID WC   [MAR Hold] metoprolol tartrate  25 mg Oral BID   [MAR Hold] multivitamin with minerals  1 tablet Oral Daily   sodium chloride flush       [MAR Hold] thiamine  100 mg Oral Daily   Or   [MAR Hold] thiamine  100 mg Intravenous Daily   Continuous Infusions:  [MAR Hold] ceFEPime (MAXIPIME) IV Stopped (09/24/2021 0537)   dextrose 5 % and 0.9% NaCl 50 mL/hr at 09/22/2021 0952   heparin 2,750 Units/hr (09/03/2021 0700)   [MAR Hold] metronidazole Stopped (09/01/2021 0625)   [MAR Hold] potassium chloride 10 mEq (09/05/2021 0909)   [MAR Hold] vancomycin 1,750 mg (09/06/2021 0916)   PRN Meds: [MAR Hold] acetaminophen, [MAR Hold] acetaminophen, [DISCONTINUED] LORazepam **OR** [MAR Hold] LORazepam, [MAR Hold] ondansetron **OR** [MAR Hold] ondansetron (ZOFRAN) IV, [MAR Hold] oxyCODONE   Vital Signs    Vitals:   09/08/21 2334 09/10/2021 0326 09/10/2021 0828 08/31/2021 0939  BP: 122/72 97/66 113/75 114/72  Pulse: (!) 108 87 95   Resp: 17 (!) 24    Temp: (!) 102.5 F (39.2 C) 98.7 F (37.1 C)    TempSrc: Oral Oral    SpO2: 95% 96% 94% 93%  Weight:      Height:        Intake/Output Summary (Last 24 hours) at 09/08/2021 1015 Last data filed at 09/15/2021 0700 Gross per 24 hour  Intake 3958.14 ml  Output 510 ml  Net 3448.14 ml   Filed Weights   09/20/2021 0648  Weight: 93 kg    Physical Exam   GEN: Well nourished, well developed, in no  acute distress.  HEENT: Grossly normal appearing. Does not open eyes for evaluation. Neck: Supple, no JVD, carotid bruits, or masses. Cardiac: RRR, no murmurs, rubs, or gallops. No clubbing, cyanosis.  2+ RLE edema w/ erythema.  R foot wrapped.  Radials 2+, L DP/PT 1+ and equal bilaterally.  Respiratory:  Respirations regular and unlabored, diminished breath sounds @ bases - poor effort. GI: Soft, nontender, nondistended, BS + x 4. MS: no deformity or atrophy. Skin: warm and dry, no rash.  Erythema to R lower leg. Neuro:  Strength and sensation are intact - unable to fully evaluate. Psych: Awake, does not respond to verbal cues.  Labs    Chemistry Recent Labs  Lab 09/17/2021 0658 09/08/21 0616 09/08/21 1918 09/19/2021 0259  NA 128* 128* 132* 133*  K 4.0 3.2*  --  3.0*  CL 87* 94*  --  100  CO2 27 29  --  28  GLUCOSE 317* 126*  --  94  BUN 18 16  --  16  CREATININE 0.90 0.66  --  0.62  CALCIUM 13.6* 13.3*  --  13.6*  PROT 6.4*  --   --   --   ALBUMIN 2.6*  --   --   --  AST 33  --   --   --   ALT 23  --   --   --   ALKPHOS 94  --   --   --   BILITOT 3.1*  --   --   --   GFRNONAA >60 >60  --  >60  ANIONGAP 14 5  --  5     Hematology Recent Labs  Lab 08/31/2021 0658 09/08/21 0616 09/05/2021 0259  WBC 11.2* 12.4* 8.2  RBC 4.64 4.13* 3.91*  HGB 14.4 12.6* 11.9*  HCT 41.1 36.2* 34.9*  MCV 88.6 87.7 89.3  MCH 31.0 30.5 30.4  MCHC 35.0 34.8 34.1  RDW 11.9 11.9 12.1  PLT 185 164 153    Cardiac Enzymes  Recent Labs  Lab 09/11/2021 0658 08/30/2021 0912 09/26/2021 1206 09/20/2021 1444  TROPONINIHS 5,943* 5,878* 4,796* 4,189*     HbA1c  Lab Results  Component Value Date   HGBA1C 8.2 (H) 08/30/2021    Radiology    DG Chest 1 View  Result Date: 09/03/2021 CLINICAL DATA:  Sepsis. EXAM: CHEST  1 VIEW COMPARISON:  None. FINDINGS: The heart size and mediastinal contours are within normal limits. Both lungs are clear. The visualized skeletal structures are unremarkable.  IMPRESSION: No active disease. Electronically Signed   By: Marijo Conception M.D.   On: 09/11/2021 07:50   DG Tibia/Fibula Right  Result Date: 09/24/2021 CLINICAL DATA:  Wound. EXAM: RIGHT TIBIA AND FIBULA - 2 VIEW COMPARISON:  None. FINDINGS: There is no evidence of fracture or other focal bone lesions. Soft tissue gas is seen in the ankle and lower calf region concerning for possible cellulitis. IMPRESSION: Soft tissue gas seen in distal calf soft tissues concerning for cellulitis. No definite bony abnormality is noted. Electronically Signed   By: Marijo Conception M.D.   On: 09/11/2021 10:54   DG Ankle Complete Right  Result Date: 09/04/2021 CLINICAL DATA:  Right ankle wound. EXAM: RIGHT ANKLE - COMPLETE 3+ VIEW COMPARISON:  None. FINDINGS: There is no evidence of fracture, dislocation, or joint effusion. There is no evidence of arthropathy or other focal bone abnormality. Status post transmetatarsal amputation of the foot. Soft tissue gas is seen overlying lateral malleolus concerning for cellulitis. No definite lytic destruction is seen to suggest osteomyelitis. IMPRESSION: Soft tissue gas seen overlying lateral malleolus concerning for cellulitis. No definite lytic destruction is seen to suggest osteomyelitis. Electronically Signed   By: Marijo Conception M.D.   On: 09/10/2021 10:52   CT HEAD WO CONTRAST (5MM)  Result Date: 09/08/2021 CLINICAL DATA:  Altered level of consciousness, right foot wound, hypertension, diabetes EXAM: CT HEAD WITHOUT CONTRAST TECHNIQUE: Contiguous axial images were obtained from the base of the skull through the vertex without intravenous contrast. COMPARISON:  02/06/2019 FINDINGS: Evaluation is extremely limited due to patient motion throughout the exam. Multiple attempts were made at acquiring diagnostic images. Brain: Limited evaluation due to patient motion demonstrates no signs of acute infarct or hemorrhage. The lateral ventricles and midline structures are  unremarkable. No acute extra-axial fluid collections. No mass effect. Vascular: No hyperdense vessel or unexpected calcification. Skull: Normal. Negative for fracture or focal lesion. Sinuses/Orbits: No acute finding. Other: None. IMPRESSION: 1. Limited evaluation due to patient motion. 2. No acute intracranial process. Electronically Signed   By: Randa Ngo M.D.   On: 09/08/2021 17:40   MR ANKLE RIGHT WO CONTRAST  Result Date: 09/08/2021 CLINICAL DATA:  Osteomyelitis suspected. Ulcer at fifth metatarsal base, gas  on x-rays. EXAM: MRI OF THE RIGHT ANKLE WITHOUT CONTRAST TECHNIQUE: Multiplanar, multisequence MR imaging of the ankle was performed. No intravenous contrast was administered. COMPARISON:  Radiograph dated September 07, 2021 FINDINGS: TENDONS Peroneal: Peroneal longus tendon intact. Peroneal brevis intact. There is fluid tracking along the peroneus longus and brevis concerning for tenosynovitis. Posteromedial: Posterior tibial tendon intact. Flexor hallucis longus tendon intact. Flexor digitorum longus tendon intact. Fluid tracking along the flexor tendons. Anterior: Tibialis anterior tendon intact. Extensor hallucis longus tendon intact Extensor digitorum longus tendon intact. Achilles:  Mild tendinopathy without discrete tear. Plantar Fascia: Normal in size and signal. LIGAMENTS Lateral: Anterior talofibular ligament intact. Calcaneofibular ligament intact. Posterior talofibular ligament intact. Anterior and posterior tibiofibular ligaments intact. Medial: Deltoid ligament intact. Spring ligament intact. CARTILAGE Ankle Joint: No joint effusion. Normal ankle mortise. No chondral defect. Subtalar Joints/Sinus Tarsi: Normal subtalar joints. No subtalar joint effusion. Normal sinus tarsi. Bones: Bone marrow edema about the base of the fifth metatarsal and cuboid. Normal marrow signal of the distal tibia/fibula, talus. Heterogeneous signal of the calcaneus without evidence of acute fracture.  Postsurgical changes for prior amputation through the base of the metatarsals. Soft Tissue: Skin thickening and subcutaneous soft tissue edema. Skin irregularity about the lateral aspect of the base of the fifth metatarsal. IMPRESSION: 1. Bone marrow edema about the base of the fifth metatarsal and cuboid bone, which in the presence of adjacent skin defect is highly suspicious for acute osteomyelitis. 2. Generalized subcutaneous soft tissue edema concerning for cellulitis. No drainable fluid collection or abscess. 3. Fluid tracking along the flexor and peroneal compartments tendon concerning for tenosynovitis. Electronically Signed   By: Keane Police D.O.   On: 09/08/2021 08:24   DG Foot Complete Right  Result Date: 09/14/2021 CLINICAL DATA:  Right foot infection.  Open wound. EXAM: RIGHT FOOT COMPLETE - 3+ VIEW COMPARISON:  May 09, 2021. FINDINGS: Status post transmetatarsal amputation of the foot. Large soft tissue ulceration is seen overlying the residual portion of the proximal fifth metatarsal. There is irregularity involving this bone, but it is uncertain if it represents postsurgical change or possibly osteomyelitis. IMPRESSION: Large soft tissue ulceration is seen overlying the residual portion of proximal fifth metatarsal. There is noted irregularity involving this bone, but it is uncertain if it represents postsurgical change or possibly osteomyelitis. Electronically Signed   By: Marijo Conception M.D.   On: 09/23/2021 07:53   US THYROID  Result Date: 08/29/2021 CLINICAL DATA:  Hypercalcemia EXAM: THYROID ULTRASOUND TECHNIQUE: Ultrasound examination of the thyroid gland and adjacent soft tissues was performed. COMPARISON:  None. FINDINGS: Parenchymal Echotexture: Normal Isthmus: 0.5 cm Right lobe: 4.8 x 2.1 x 2.3 cm Left lobe: 5.8 x 1.5 x 1.5 cm _________________________________________________________ Estimated total number of nodules >/= 1 cm: 0 Number of spongiform nodules >/=  2 cm not  described below (TR1): 0 Number of mixed cystic and solid nodules >/= 1.5 cm not described below (TR2): 0 _________________________________________________________ 1.5 x 1.3 x 1.3 cm hypoechoic nodule seen inferior and posterior to the inferior tip of the left thyroid lobe is suspicious for a parathyroid adenoma. IMPRESSION: 1. No significant sonographic abnormality of the thyroid. 2. 1.5 x 1.3 x 1.3 cm hypoechoic mass seen inferior and posterior to the inferior tip of the left thyroid lobe is suspicious for a parathyroid adenoma. The above is in keeping with the ACR TI-RADS recommendations - J Am Coll Radiol 2017;14:587-595. Electronically Signed   By: Miachel Roux M.D.   On: 09/17/2021  14:51    Telemetry    Sinus tachycardia  Sinus rhythm overnight - Personally Reviewed  Cardiac Studies   2D Echocardiogram 12.11.2022  1. Left ventricular ejection fraction, by estimation, is 30 to 35%. The  left ventricle has moderately decreased function. The left ventricle  demonstrates regional wall motion abnormalities (see scoring  diagram/findings for description). The left  ventricular internal cavity size was mildly dilated. There is mild left  ventricular hypertrophy. Left ventricular diastolic parameters are  indeterminate. There is severe hypokinesis of the left ventricular,  mid-apical anteroseptal wall and anterior wall.   There is akinesis of the left ventricular, apical segment. The average  left ventricular global longitudinal strain is -9.0 %. The global  longitudinal strain is abnormal.   2. Right ventricular systolic function is normal. The right ventricular  size is normal. Tricuspid regurgitation signal is inadequate for assessing  PA pressure.   3. Left atrial size was moderately dilated.   4. The mitral valve is normal in structure. No evidence of mitral valve  regurgitation. No evidence of mitral stenosis.   5. The aortic valve is normal in structure. Aortic valve regurgitation is   not visualized. Aortic valve sclerosis/calcification is present, without  any evidence of aortic stenosis.   Patient Profile     55 y.o. male w/a h/o HTN, DMII, HL, PAD, HL, etoh and tob abuse, who was admitted 12/10 w/ RLE ulceration/osteomyelitis and sepsis as well as Afib RVR and NSTEMI.  Echo w/ EF 30-35%.  Assessment & Plan    1.  Osteomyelitis/Sepsis/Preop Cardiovascular eval:  Plan for R foot amputation today. Pt remains lethargic, minimally responsive (does not follow directions).  In setting of osteo/sepsis, Pt w/ PAF (currently maintaining sinus) and NSTEMI ([id number]  [id number]  [id number]  [id number]).  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  With these findings, pt remains high risk for CV complications related to non-cardiac surgery, however, his acute illness makes him a poor candidate for an ischemic eval (cath), which will likely only serve to delay his much needed vascular procedure.  Cont asa/statin/? blocker in perioperative period.  Watch volume closely.  2.  NSTEMI:  In setting of #1 and Afib, HsTrop 5943  [id number]   [id number]  [id number].  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  Currently, pt is unable to express any Ss of c/p or dyspnea.  Following mgmt of osteomyelitis/sepsis, he will require ischemic eval/cath, but will await recovery.  Cont asa, statin, ? blocker.  3.  HFrEF/Presumed ICM:  As above, EF 30-35%.  Hemodynamically stable.  Pending hemodynamic stability post-amputation, will look to cont ? blocker and evaluate options for adding ARNI/SLGT2i/MRA.  As above, will need cath following recovery. Watch volume closely during periop peri0d.  4.  PAF:  Currently maintaining sinus.  CHA2DS2VASc likely 4.  Will need [place] following procedures.  Cont ? blocker.  If recurrent afib post-op, may need short course of amio pending hemodynamic stability.  5.   DMII:  per IM.  Consider SGLT2i but may wish to wait until outpt f/u to ensure appropriate f/u.  6.  Tob/Etoh abuse:  will need counseling and would likely benefit from social work eval.  7.  Hypokalemia:  Supplementation w/ IVF.  Signed, Murray Hodgkins, NP  08/31/2021, 10:15 AM    For questions or updates, please contact  Please consult [email or web address] for contact info under Cardiology/STEMI.

## 2021-09-09 NOTE — Progress Notes (Signed)
*  PRELIMINARY RESULTS* Echocardiogram 2D Echocardiogram has been performed.  [name] 08/29/2021, 7:45 AM

## 2021-09-09 NOTE — Progress Notes (Signed)
PROGRESS NOTE    [name]  YQM:[id number] DOB: 1966-07-10 DOA: 09/05/2021 PCP: Pcp, No    Brief Narrative:  Tolbert Matheson is a 55 y.o. 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 Patient states that postsurgery he followed-up with the podiatrist once but has not gone back since then.  Chart review showed he followed up with the podiatrist on 07/09/2021 and at that time he had a 2 x 2 full-thickness ulcer without signs of infection.  12/11 dressing wet, weeping with fluid and some pus 12/12 pt is s/p guillotine amputation and VAC placed. Plan to go to OR for AKA on Thursday. Tmax 102.5   Consultants:  Cardiology, vascular surgery, podiatry  Procedures:  MRI of ankle 1. Bone marrow edema about the base of the fifth metatarsal and cuboid bone, which in the presence of adjacent skin defect is highly suspicious for acute osteomyelitis.  2. Generalized subcutaneous soft tissue edema concerning for cellulitis. No drainable fluid collection or abscess.  3. Fluid tracking along the flexor and peroneal compartments tendon concerning for tenosynovitis    Antimicrobials:  Cefepime, metronidazole, vancomycin   Subjective: Pt not fully awake post procedure. Asked him to open eyes, and tries but barely open. Iv out of arm, appears pt pulled out possibly. Nsg notified.   Objective: Vitals:   09/08/21 1956 09/08/21 2334 09/27/2021 0326 09/06/2021 0828  BP: 120/73 122/72 97/66 113/75  Pulse: (!) 110 (!) 108 87 95  Resp: (!) 30 17 (!) 24   Temp: 99.3 F (37.4 C) (!) 102.5 F (39.2 C) 98.7 F (37.1 C)   TempSrc: Oral Oral Oral   SpO2: 92% 95% 96% 94%  Weight:      Height:        Intake/Output Summary (Last 24 hours) at 08/31/2021 0925 Last data  filed at 09/05/2021 0700 Gross per 24 hour  Intake 3958.14 ml  Output 510 ml  Net 3448.14 ml   Filed Weights   09/27/2021 0648  Weight: 93 kg    Examination: Drowsly/lethargic Cta anteriorly Reg s1/s2 no gallop Soft benign +bs Rt vac in place. Uanble to assess cns.RUE bloody , iv line out    Data Reviewed: I have personally reviewed following labs and imaging studies  CBC: Recent Labs  Lab 09/01/2021 0658 09/08/21 0616 09/20/2021 0259  WBC 11.2* 12.4* 8.2  NEUTROABS 10.6*  --   --   HGB 14.4 12.6* 11.9*  HCT 41.1 36.2* 34.9*  MCV 88.6 87.7 89.3  PLT 185 164 528   Basic Metabolic Panel: Recent Labs  Lab 09/01/2021 0658 09/17/2021 0912 09/08/21 0616 09/08/21 1918 08/29/2021 0259  NA 128*  --  128* 132* 133*  K 4.0  --  3.2*  --  3.0*  CL 87*  --  94*  --  100  CO2 27  --  29  --  28  GLUCOSE 317*  --  126*  --  94  BUN 18  --  16  --  16  CREATININE 0.90  --  0.66  --  0.62  CALCIUM 13.6*  --  13.3*  --  13.6*  MG  --  1.0* 1.6*  --  1.5*  PHOS  --   --   --   --  1.6*  GFR: Estimated Creatinine Clearance: 124.7 mL/min (by C-G formula based on SCr of 0.62 mg/dL). Liver Function Tests: Recent Labs  Lab 09/08/2021 0658  AST 33  ALT 23  ALKPHOS 94  BILITOT 3.1*  PROT 6.4*  ALBUMIN 2.6*   No results for input(s): LIPASE, AMYLASE in the last 168 hours. No results for input(s): AMMONIA in the last 168 hours. Coagulation Profile: Recent Labs  Lab 09/22/2021 0659 09/14/2021 0259  INR 1.2 1.6*   Cardiac Enzymes: No results for input(s): CKTOTAL, CKMB, CKMBINDEX, TROPONINI in the last 168 hours. BNP (last 3 results) No results for input(s): PROBNP in the last 8760 hours. HbA1C: Recent Labs    09/16/2021 0658  HGBA1C 8.2*   CBG: Recent Labs  Lab 09/08/21 2345 09/14/2021 0011 09/02/2021 0324 09/05/2021 0451 09/19/2021 0830  GLUCAP 63* 136* 102* 109* 129*   Lipid Profile: No results for input(s): CHOL, HDL, LDLCALC, TRIG, CHOLHDL, LDLDIRECT in the last 72  hours. Thyroid Function Tests: No results for input(s): TSH, T4TOTAL, FREET4, T3FREE, THYROIDAB in the last 72 hours. Anemia Panel: No results for input(s): VITAMINB12, FOLATE, FERRITIN, TIBC, IRON, RETICCTPCT in the last 72 hours. Sepsis Labs: Recent Labs  Lab 09/18/2021 0658 09/01/2021 0912 09/25/2021 1206 09/28/2021 1444 09/08/21 0616  PROCALCITON 15.80  --   --   --  8.58  LATICACIDVEN 2.9* 2.9* 2.8* 2.8*  --     Recent Results (from the past 240 hour(s))  Urine Culture     Status: None   Collection Time: 08/29/2021  6:48 AM   Specimen: Urine, Random  Result Value Ref Range Status   Specimen Description   Final    URINE, RANDOM Performed at [facility], [address], [place], [place] [address]    Special Requests   Final    NONE Performed at [facility], [address], [place], [place] [address]    Culture   Final    NO GROWTH Performed at [facility], [place] [address] [place], [place] [address]    Report Status 09/25/2021 FINAL  Final  Culture, blood (routine x 2)     Status: None (Preliminary result)   Collection Time: 09/21/2021  6:58 AM   Specimen: BLOOD  Result Value Ref Range Status   Specimen Description BLOOD LEFT FA  Final   Special Requests   Final    BOTTLES DRAWN AEROBIC AND ANAEROBIC Blood Culture adequate volume   Culture   Final    NO GROWTH 2 DAYS Performed at [facility], [address] [place], [place] [address]    Report Status PENDING  Incomplete  Culture, blood (routine x 2)     Status: None (Preliminary result)   Collection Time: 09/04/2021  6:59 AM   Specimen: BLOOD  Result Value Ref Range Status   Specimen Description BLOOD RIGHT FA  Final   Special Requests   Final    BOTTLES DRAWN AEROBIC AND ANAEROBIC Blood Culture adequate volume   Culture   Final    NO GROWTH 2 DAYS Performed at [facility], [address], [place], [place] [address]    Report Status PENDING  Incomplete  Resp Panel by  RT-PCR (Flu A&B, Covid) Nasopharyngeal Swab     Status: None   Collection Time: 09/04/2021  7:44 AM   Specimen: Nasopharyngeal Swab; Nasopharyngeal(NP) swabs in vial transport medium  Result Value Ref Range Status   SARS Coronavirus 2 by RT PCR NEGATIVE NEGATIVE Final    Comment: (  NOTE) SARS-CoV-2 target nucleic acids are NOT DETECTED.  The SARS-CoV-2 RNA is generally detectable in upper respiratory specimens during the acute phase of infection. The lowest concentration of SARS-CoV-2 viral copies this assay can detect is 138 copies/mL. A negative result does not preclude SARS-Cov-2 infection and should not be used as the sole basis for treatment or other patient management decisions. A negative result may occur with  improper specimen collection/handling, submission of specimen other than nasopharyngeal swab, presence of viral mutation(s) within the areas targeted by this assay, and inadequate number of viral copies(<138 copies/mL). A negative result must be combined with clinical observations, patient history, and epidemiological information. The expected result is Negative.  Fact Sheet for Patients:  EntrepreneurPulse.com.au  Fact Sheet for Healthcare Providers:  IncredibleEmployment.be  This test is no t yet approved or cleared by the [place] FDA and  has been authorized for detection and/or diagnosis of SARS-CoV-2 by FDA under an Emergency Use Authorization (EUA). This EUA will remain  in effect (meaning this test can be used) for the duration of the COVID-19 declaration under Section 564(b)(1) of the Act, 21 U.S.C.section 360bbb-3(b)(1), unless the authorization is terminated  or revoked sooner.       Influenza A by PCR NEGATIVE NEGATIVE Final   Influenza B by PCR NEGATIVE NEGATIVE Final    Comment: (NOTE) The Xpert Xpress SARS-CoV-2/FLU/RSV plus assay is intended as an aid in the diagnosis of influenza from Nasopharyngeal swab  specimens and should not be used as a sole basis for treatment. Nasal washings and aspirates are unacceptable for Xpert Xpress SARS-CoV-2/FLU/RSV testing.  Fact Sheet for Patients: EntrepreneurPulse.com.au  Fact Sheet for Healthcare Providers: IncredibleEmployment.be  This test is not yet approved or cleared by the [place] FDA and has been authorized for detection and/or diagnosis of SARS-CoV-2 by FDA under an Emergency Use Authorization (EUA). This EUA will remain in effect (meaning this test can be used) for the duration of the COVID-19 declaration under Section 564(b)(1) of the Act, 21 U.S.C. section 360bbb-3(b)(1), unless the authorization is terminated or revoked.  Performed at [facility], [address] [place], [place] [address]   Aerobic/Anaerobic Culture w Gram Stain (surgical/deep wound)     Status: None (Preliminary result)   Collection Time: 09/17/2021  8:16 AM   Specimen: Wound  Result Value Ref Range Status   Specimen Description   Final    WOUND Performed at [facility], [address], [place], [place] [address]    Special Requests RIGHT FOOT  Final   Gram Stain   Final    MODERATE WBC PRESENT, PREDOMINANTLY MONONUCLEAR MODERATE GRAM POSITIVE COCCI IN PAIRS AND CHAINS MODERATE GRAM NEGATIVE RODS    Culture   Final    TOO YOUNG TO READ Performed at [facility], [place] [address], [place], [place] [address]    Report Status PENDING  Incomplete         Radiology Studies: DG Tibia/Fibula Right  Result Date: 09/25/2021 CLINICAL DATA:  Wound. EXAM: RIGHT TIBIA AND FIBULA - 2 VIEW COMPARISON:  None. FINDINGS: There is no evidence of fracture or other focal bone lesions. Soft tissue gas is seen in the ankle and lower calf region concerning for possible cellulitis. IMPRESSION: Soft tissue gas seen in distal calf soft tissues concerning for cellulitis. No definite bony abnormality is noted.  Electronically Signed   By: Marijo Conception M.D.   On: 09/26/2021 10:54   DG Ankle Complete Right  Result  Date: 09/05/2021 CLINICAL DATA:  Right ankle wound. EXAM: RIGHT ANKLE - COMPLETE 3+ VIEW COMPARISON:  None. FINDINGS: There is no evidence of fracture, dislocation, or joint effusion. There is no evidence of arthropathy or other focal bone abnormality. Status post transmetatarsal amputation of the foot. Soft tissue gas is seen overlying lateral malleolus concerning for cellulitis. No definite lytic destruction is seen to suggest osteomyelitis. IMPRESSION: Soft tissue gas seen overlying lateral malleolus concerning for cellulitis. No definite lytic destruction is seen to suggest osteomyelitis. Electronically Signed   By: Marijo Conception M.D.   On: 09/06/2021 10:52   CT HEAD WO CONTRAST (5MM)  Result Date: 09/08/2021 CLINICAL DATA:  Altered level of consciousness, right foot wound, hypertension, diabetes EXAM: CT HEAD WITHOUT CONTRAST TECHNIQUE: Contiguous axial images were obtained from the base of the skull through the vertex without intravenous contrast. COMPARISON:  02/06/2019 FINDINGS: Evaluation is extremely limited due to patient motion throughout the exam. Multiple attempts were made at acquiring diagnostic images. Brain: Limited evaluation due to patient motion demonstrates no signs of acute infarct or hemorrhage. The lateral ventricles and midline structures are unremarkable. No acute extra-axial fluid collections. No mass effect. Vascular: No hyperdense vessel or unexpected calcification. Skull: Normal. Negative for fracture or focal lesion. Sinuses/Orbits: No acute finding. Other: None. IMPRESSION: 1. Limited evaluation due to patient motion. 2. No acute intracranial process. Electronically Signed   By: Randa Ngo M.D.   On: 09/08/2021 17:40   MR ANKLE RIGHT WO CONTRAST  Result Date: 09/08/2021 CLINICAL DATA:  Osteomyelitis suspected. Ulcer at fifth metatarsal base, gas on x-rays.  EXAM: MRI OF THE RIGHT ANKLE WITHOUT CONTRAST TECHNIQUE: Multiplanar, multisequence MR imaging of the ankle was performed. No intravenous contrast was administered. COMPARISON:  Radiograph dated September 07, 2021 FINDINGS: TENDONS Peroneal: Peroneal longus tendon intact. Peroneal brevis intact. There is fluid tracking along the peroneus longus and brevis concerning for tenosynovitis. Posteromedial: Posterior tibial tendon intact. Flexor hallucis longus tendon intact. Flexor digitorum longus tendon intact. Fluid tracking along the flexor tendons. Anterior: Tibialis anterior tendon intact. Extensor hallucis longus tendon intact Extensor digitorum longus tendon intact. Achilles:  Mild tendinopathy without discrete tear. Plantar Fascia: Normal in size and signal. LIGAMENTS Lateral: Anterior talofibular ligament intact. Calcaneofibular ligament intact. Posterior talofibular ligament intact. Anterior and posterior tibiofibular ligaments intact. Medial: Deltoid ligament intact. Spring ligament intact. CARTILAGE Ankle Joint: No joint effusion. Normal ankle mortise. No chondral defect. Subtalar Joints/Sinus Tarsi: Normal subtalar joints. No subtalar joint effusion. Normal sinus tarsi. Bones: Bone marrow edema about the base of the fifth metatarsal and cuboid. Normal marrow signal of the distal tibia/fibula, talus. Heterogeneous signal of the calcaneus without evidence of acute fracture. Postsurgical changes for prior amputation through the base of the metatarsals. Soft Tissue: Skin thickening and subcutaneous soft tissue edema. Skin irregularity about the lateral aspect of the base of the fifth metatarsal. IMPRESSION: 1. Bone marrow edema about the base of the fifth metatarsal and cuboid bone, which in the presence of adjacent skin defect is highly suspicious for acute osteomyelitis. 2. Generalized subcutaneous soft tissue edema concerning for cellulitis. No drainable fluid collection or abscess. 3. Fluid tracking along the  flexor and peroneal compartments tendon concerning for tenosynovitis. Electronically Signed   By: Keane Police D.O.   On: 09/08/2021 08:24   US THYROID  Result Date: 09/18/2021 CLINICAL DATA:  Hypercalcemia EXAM: THYROID ULTRASOUND TECHNIQUE: Ultrasound examination of the thyroid gland and adjacent soft tissues was performed. COMPARISON:  None. FINDINGS: Parenchymal Echotexture: Normal  Isthmus: 0.5 cm Right lobe: 4.8 x 2.1 x 2.3 cm Left lobe: 5.8 x 1.5 x 1.5 cm _________________________________________________________ Estimated total number of nodules >/= 1 cm: 0 Number of spongiform nodules >/=  2 cm not described below (TR1): 0 Number of mixed cystic and solid nodules >/= 1.5 cm not described below (TR2): 0 _________________________________________________________ 1.5 x 1.3 x 1.3 cm hypoechoic nodule seen inferior and posterior to the inferior tip of the left thyroid lobe is suspicious for a parathyroid adenoma. IMPRESSION: 1. No significant sonographic abnormality of the thyroid. 2. 1.5 x 1.3 x 1.3 cm hypoechoic mass seen inferior and posterior to the inferior tip of the left thyroid lobe is suspicious for a parathyroid adenoma. The above is in keeping with the ACR TI-RADS recommendations - J Am Coll Radiol 2017;14:587-595. Electronically Signed   By: Miachel Roux M.D.   On: 09/23/2021 14:51        Scheduled Meds:  aspirin EC  81 mg Oral Daily   atorvastatin  40 mg Oral QPM   cinacalcet  30 mg Oral Q supper   folic acid  1 mg Oral Daily   insulin aspart  0-15 Units Subcutaneous TID WC   metoprolol tartrate  25 mg Oral BID   multivitamin with minerals  1 tablet Oral Daily   thiamine  100 mg Oral Daily   Or   thiamine  100 mg Intravenous Daily   Continuous Infusions:  ceFEPime (MAXIPIME) IV Stopped (09/23/2021 0537)   dextrose 5 % and 0.9% NaCl 100 mL/hr at 09/20/2021 0700   heparin 2,750 Units/hr (09/04/2021 0700)   metronidazole Stopped (09/22/2021 9030)   pamidronate 126.8 mL/hr at 09/21/2021  0700   potassium chloride 10 mEq (09/15/2021 0909)   vancomycin 1,750 mg (09/22/2021 0916)    Assessment & Plan:   Principal Problem:   Sepsis ([place]) Active Problems:   Diabetes mellitus with hyperglycemia ([place])   Hypercalcemia   NSTEMI (non-ST elevated myocardial infarction) ([place])   Nicotine dependence   Alcohol dependence ([place])   Diabetic foot ulcer ([place])   Hyponatremia   Patient is a 55 year old male admitted to the hospital for sepsis from an infected diabetic foot ulcer with acute osteomyelitis         Sepsis ([place]) As evidenced by tachycardia, tachypnea, leukocytosis, lactic acidosis, ulcerated area on the lateral portion of the right foot with purulent   12/12 due to foot infection ID consulted as pt with acute osteomyelitis  Continue IV abx Bcx pending will f/u     non-ST elevation MI TP elevated Echo with EF 30-35%, with regional WMA Cards consulted, input appreciated. Likely hold off on ischemic w/u since with foot infection/sepsis. Continue beta blk, asa, statins Was on heparin gtt   PAF In setting of above stressors Continue beta blk May need antiarrhythmic       Acute metabolic encephalopathy 2/2 infection v.s. etoh withdrawal (pt drinks but not sure how much  on ciwa protocal incase he is a heavy drinker 12/12-CT head negative. Treat underlying cause This am was lethargic for me but just came back from OR.will monitor closely       Diabetes mellitus with complications of hyperglycemia Hold glipizide 12/12 A1C 8.2 Continue riss Monitor FS-so far stable       Diabetic foot ulcer with acute osteomyelitis  Podiatry and vascular surgery's input was appreciated  Foot not salvageable Continue IV antibiotics   MRI with acute osteomyelitis of the first metatarsal base and cuboid region 12/12 s/p  guillotine amputation and VAC placed today. Plan to go to OR for AKA on Thursday . 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  Plan for surgery .  Pt very sick, will transfer to SDU for closer monitoring , nsg and charge nurse notified.            LOS: 2 days   Time spent: 45 minutes with more than 50% on [place], MD [facility] Pager [phone]  If 7PM-7AM, please contact night-coverage 09/16/2021, 9:25 AM

## 2021-09-09 NOTE — Op Note (Addendum)
[facility]   OPERATIVE NOTE  DATE: 09/08/2021  PRE-OPERATIVE DIAGNOSIS: Gangrene right foot  POST-OPERATIVE DIAGNOSIS: same as above with purulence tracking up into the calf  PROCEDURE: 1.   Open amputation (guillotine) right lower leg with VAC dressing placement  SURGEON: [name]  ASSISTANT(S): Hezzie Bump, PA-C  ANESTHESIA: general  ESTIMATED BLOOD LOSS: 150 cc  FINDING(S): 1.  Frank purulence tracking down through multiple muscle compartments up into the calf precluding formal below-knee amputation and necessitating an open amputation   INDICATIONS:   [name] is a 55 y.o. male who presents with gangrene of the right foot with frank purulence.  He is brought to the operating room for amputation.  Risks and benefits were discussed and informed consent was obtained. An assistant was present during the procedure to help facilitate the exposure and expedite the procedure.   DESCRIPTION: After obtaining full informed written consent, the patient was brought back to the operating room and placed supine upon the operating table.  The patient was prepped and draped in the standard fashion. The assistant provided retraction and mobilization to help facilitate exposure and expedite the procedure throughout the entire procedure.  This included following suture, using retractors, and optimizing lighting.  Given the purulence tracking from the foot, I started with a circular incision just below the normal location for a below-knee amputation.  On initial incision and breaking into the fascia, a large amount of frank purulence was seen from multiple muscle compartments.  This was clearly a frankly infected situation and a formal amputation was not appropriate.  We completed the circular incision and dissected down to the bones both the tibia and the fibula were then transected with the oscillating saw.  All vessels were then ligated divided with silk suture  ligatures.  The posterior flap was completed with amputation knife.  The wound was then irrigated and hemostasis was achieved.. At this point, we elected to complete the procedure.  A VAC dressing was then cut to fit the wound and strips of [place] were used to obtain an occlusive seal.  The patient was taken to the recovery room in stable condition.   COMPLICATIONS: None  CONDITION: Stable  [name]  09/21/2021, 11:08 AM    This note was created with Dragon Medical transcription system. Any errors in dictation are purely unintentional.

## 2021-09-09 NOTE — Consult Note (Signed)
NAME: [name]  DOB: 09-Feb-1966  MRN: [id number]  Date/Time: 09/19/2021 12:10 PM  REQUESTING PROVIDER: Dr Kurtis Bushman Subjective:  REASON FOR CONSULT: sepsis ?Pt is somnolent- no history available from patient and chart reviewed. Pt known to me from previous admissions [name] is a 55 y.o. 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 MRI showed Bone marrow edema about the base of the fifth metatarsal and cuboid bone, which in the presence of adjacent skin defect is highly suspicious for acute osteomyelitis. Pt underwent Guillotine surgery today I am seeing the patient for management to infection  was hospitalized between 05/09/21-05/22/21 with rt foot infection. Underwent transmetatarsal amputation 8/12, with I&D of residual abscess of right foot 8/16. Culture was proteus and bacteroides. 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- He was discharged home to complete 4 weeks of IV ceftriaxone + flagyl and then took 2 weeks of PO augmentin- last saw him on 06/13/21 and the surgical site had healed and was looking good       Past Medical History:  Diagnosis Date   Diabetes [facility])    Hypertension     Past Surgical History:  Procedure Laterality Date   AMPUTATION TOE Right 07/11/2020   Procedure: AMPUTATION TOE-Right Great Toe;  Surgeon: Samara Deist, DPM;  Location: [facility] ORS;  Service: Podiatry;  Laterality: Right;   I & D EXTREMITY Right 05/14/2021   Procedure: IRRIGATION AND DEBRIDEMENT EXTREMITY- RIGHT FOOT;  Surgeon: Samara Deist, DPM;  Location: [facility] ORS;  Service: Podiatry;   Laterality: Right;   IRRIGATION AND DEBRIDEMENT ABSCESS Right 10/31/2018   Procedure: IRRIGATION AND DEBRIDEMENT ABSCESS;  Surgeon: Samara Deist, DPM;  Location: [facility] ORS;  Service: Podiatry;  Laterality: Right;   IRRIGATION AND DEBRIDEMENT FOOT Right 11/02/2018   Procedure: IRRIGATION AND DEBRIDEMENT FOOT;  Surgeon: Sharlotte Alamo, DPM;  Location: [facility] ORS;  Service: Podiatry;  Laterality: Right;   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   Procedure: TRANSMETATARSAL AMPUTATION;  Surgeon: Samara Deist, DPM;  Location: [facility] ORS;  Service: Podiatry;  Laterality: Right;    Social History   Socioeconomic History   Marital status: Single    Spouse name: Not on file   Number of children: Not on file   Years of education: Not on file   Highest education level: Not on file  Occupational History   Not on file  Tobacco Use   Smoking status: Light Smoker    Types: Cigars   Smokeless tobacco: Never  Vaping Use   Vaping Use: Never used  Substance and Sexual Activity   Alcohol use: Yes    Comment: "too much"    Drug use: Never   Sexual activity: Yes  Other Topics Concern   Not on file  Social History Narrative   Not on file   Social Determinants of Health   Financial Resource Strain: Not on file  Food Insecurity: Not on file  Transportation Needs: Not on file  Physical Activity: Not on file  Stress: Not on file  Social Connections: Not on file  Intimate  Partner Violence: Not on file    Family History  Problem Relation Age of Onset   CAD Father    CAD Brother    No Known Allergies I? Current Facility-Administered Medications  Medication Dose Route Frequency Provider Last Rate Last Admin   [MAR Hold] acetaminophen (TYLENOL) suppository 650 mg  650 mg Rectal Q4H PRN Mansy, Jan A, MD   650 mg at 09/08/21 2354   [MAR Hold] acetaminophen (TYLENOL) tablet 650 mg  650 mg Oral Q6H PRN Agbata, Tochukwu, MD       [MAR Hold] aspirin EC tablet 81 mg  81 mg Oral Daily Agbata, Tochukwu, MD    81 mg at 09/08/21 0927   [MAR Hold] atorvastatin (LIPITOR) tablet 40 mg  40 mg Oral QPM Agbata, Tochukwu, MD   40 mg at 09/05/2021 1707   [MAR Hold] ceFEPIme (MAXIPIME) 2 g in sodium chloride 0.9 % 100 mL IVPB  2 g Intravenous Q8H Ellington, Tobe Sos, RPH   Stopped at 09/18/2021 0537   [MAR Hold] cinacalcet (SENSIPAR) tablet 30 mg  30 mg Oral Q supper Murlean Iba, MD   30 mg at 09/05/2021 1707   dextrose 5 %-0.9 % sodium chloride infusion   Intravenous Continuous Nolberto Hanlon, MD 50 mL/hr at 09/14/2021 0952 Rate Change at 09/19/2021 0952   [START ON 09/10/2021] feeding supplement (GLUCERNA SHAKE) (GLUCERNA SHAKE) liquid 237 mL  237 mL Oral QHS Nolberto Hanlon, MD       fentaNYL (SUBLIMAZE) injection 25-50 mcg  25-50 mcg Intravenous Q5 min PRN Piscitello, Precious Haws, MD       [facility] Hold] folic acid (FOLVITE) tablet 1 mg  1 mg Oral Daily Nolberto Hanlon, MD       heparin ADULT infusion 100 units/mL (25000 units/24mL)  2,750 Units/hr Intravenous Continuous Nolberto Hanlon, MD 27.5 mL/hr at 09/28/2021 0700 2,750 Units/hr at 09/19/2021 0700   [MAR Hold] insulin aspart (novoLOG) injection 0-15 Units  0-15 Units Subcutaneous TID WC Agbata, Tochukwu, MD   2 Units at 09/08/21 0927   [facility] Hold] LORazepam (ATIVAN) injection 1-4 mg  1-4 mg Intravenous Q1H PRN Nolberto Hanlon, MD   2 mg at 09/08/21 1624   [MAR Hold] metoprolol tartrate (LOPRESSOR) tablet 25 mg  25 mg Oral BID Minna Merritts, MD   25 mg at 09/06/2021 0857   [MAR Hold] metroNIDAZOLE (FLAGYL) IVPB 500 mg  500 mg Intravenous Q12H Agbata, Tochukwu, MD   Stopped at 09/02/2021 0625   [MAR Hold] multivitamin with minerals tablet 1 tablet  1 tablet Oral Daily Nolberto Hanlon, MD       [MAR Hold] ondansetron (ZOFRAN) tablet 4 mg  4 mg Oral Q6H PRN Agbata, Tochukwu, MD       Or   [facility] Sou Hold] ondansetron (ZOFRAN) injection 4 mg  4 mg Intravenous Q6H PRN Agbata, Tochukwu, MD       [MAR Hold] oxyCODONE (Oxy IR/ROXICODONE) immediate release tablet 5 mg  5 mg Oral Q6H PRN Agbata, Tochukwu,  MD   5 mg at 09/12/2021 1846   oxyCODONE (Oxy IR/ROXICODONE) immediate release tablet 5 mg  5 mg Oral Once PRN Piscitello, Precious Haws, MD       Or   oxyCODONE (ROXICODONE) 5 MG/5ML solution 5 mg  5 mg Oral Once PRN Piscitello, Precious Haws, MD       phosphorus (K PHOS NEUTRAL) tablet 500 mg  500 mg Oral Q4H Nolberto Hanlon, MD       [START ON 09/10/2021] protein supplement (ENSURE MAX) liquid  11 oz Oral BID Nolberto Hanlon, MD       sodium chloride flush 0.9 % injection            [MAR Hold] thiamine tablet 100 mg  100 mg Oral Daily Nolberto Hanlon, MD       Or   Doug Sou Hold] thiamine (B-1) injection 100 mg  100 mg Intravenous Daily Nolberto Hanlon, MD   100 mg at 09/08/21 1624   [MAR Hold] vancomycin (VANCOREADY) IVPB 1750 mg/350 mL  1,750 mg Intravenous Q12H Rito Ehrlich A, RPH 175 mL/hr at 09/22/2021 0916 1,750 mg at 09/06/2021 0916     Abtx:  Anti-infectives (From admission, onward)    Start     Dose/Rate Route Frequency Ordered Stop   09/08/21 2000  [MAR Hold]  vancomycin (VANCOREADY) IVPB 1750 mg/350 mL        (MAR Hold since Mon 08/31/2021 at 0942.Hold Reason: Transfer to a Procedural area)   1,750 mg 175 mL/hr over 120 Minutes Intravenous Every 12 hours 09/08/21 1328 09/14/21 1959   09/22/2021 2000  vancomycin (VANCOREADY) IVPB 1500 mg/300 mL  Status:  Discontinued        1,500 mg 150 mL/hr over 120 Minutes Intravenous Every 12 hours 09/24/2021 0953 09/08/21 1328   09/16/2021 1800  [MAR Hold]  metroNIDAZOLE (FLAGYL) IVPB 500 mg        (MAR Hold since Mon 09/08/2021 at 0942.Hold Reason: Transfer to a Procedural area)   500 mg 100 mL/hr over 60 Minutes Intravenous Every 12 hours 09/08/2021 0933 09/14/21 1759   09/26/2021 1400  [MAR Hold]  ceFEPIme (MAXIPIME) 2 g in sodium chloride 0.9 % 100 mL IVPB        (MAR Hold since Mon 09/22/2021 at 0942.Hold Reason: Transfer to a Procedural area)   2 g 200 mL/hr over 30 Minutes Intravenous Every 8 hours 09/02/2021 0953 09/14/21 1359   09/08/2021 0730  vancomycin (VANCOREADY)  IVPB 2000 mg/400 mL        2,000 mg 200 mL/hr over 120 Minutes Intravenous  Once 09/03/2021 0716 09/22/2021 1036   09/16/2021 0715  ceFEPIme (MAXIPIME) 2 g in sodium chloride 0.9 % 100 mL IVPB        2 g 200 mL/hr over 30 Minutes Intravenous  Once 09/25/2021 0702 09/27/2021 0811   09/03/2021 0715  metroNIDAZOLE (FLAGYL) IVPB 500 mg        500 mg 100 mL/hr over 60 Minutes Intravenous  Once 09/23/2021 0702 08/31/2021 0858   09/06/2021 0715  vancomycin (VANCOCIN) IVPB 1000 mg/200 mL premix  Status:  Discontinued        1,000 mg 200 mL/hr over 60 Minutes Intravenous  Once 09/08/2021 0702 08/30/2021 0716       REVIEW OF SYSTEMS:   MAR: Objective:  VITALS:  BP 98/69   Pulse 90   Temp (!) (P) 97 F (36.1 C)   Resp (!) 30   Ht 6\' 3"  (1.905 m)   Wt 93 kg   SpO2 97%   BMI 25.62 kg/m  PHYSICAL EXAM:  General: somnolent/agitated intermittently, pulls lines Does not respond to commands- received morphine recently Head: Normocephalic, without obvious abnormality, atraumatic. Eyes: Conjunctivae clear, anicteric sclerae. Pupils are equal ENT Nares normal. No drainage or sinus tenderness. Lips, mucosa, and tongue normal. No Thrushannot be examined Neck: , symmetrical, no adenopathy, thyroid: non tender no carotid bruit and no JVD. Back: did not examine Lungs:b/l air entry Heart:Tachycardia. Abdomen: Soft, non-tender,not distended. Bowel sounds normal. No masses Extremities: rt leg  surgical dressing Skin: No rashes or lesions. Or bruising Lymph: Cervical, supraclavicular normal. Neurologic: cannot assess Pertinent Labs Lab Results CBC    Component Value Date/Time   WBC 8.2 09/14/2021 0259   RBC 3.91 (L) 09/01/2021 0259   HGB 11.9 (L) 09/04/2021 0259   HCT 34.9 (L) 09/11/2021 0259   PLT 153 09/08/2021 0259   MCV 89.3 09/16/2021 0259   MCH 30.4 09/22/2021 0259   MCHC 34.1 09/17/2021 0259   RDW 12.1 09/01/2021 0259   LYMPHSABS 0.3 (L) 09/05/2021 0658   MONOABS 0.7 09/24/2021 0658   EOSABS 0.0  09/17/2021 0658   BASOSABS 0.0 09/28/2021 0658    CMP Latest Ref Rng & Units 09/21/2021 09/08/2021 09/08/2021  Glucose 70 - 99 mg/dL 94 - 126(H)  BUN 6 - 20 mg/dL 16 - 16  Creatinine 0.61 - 1.24 mg/dL 0.62 - 0.66  Sodium 135 - 145 mmol/L 133(L) 132(L) 128(L)  Potassium 3.5 - 5.1 mmol/L 3.0(L) - 3.2(L)  Chloride 98 - 111 mmol/L 100 - 94(L)  CO2 22 - 32 mmol/L 28 - 29  Calcium 8.9 - 10.3 mg/dL 13.6(HH) - 13.3(HH)  Total Protein 6.5 - 8.1 g/dL - - -  Total Bilirubin 0.3 - 1.2 mg/dL - - -  Alkaline Phos 38 - 126 U/L - - -  AST 15 - 41 U/L - - -  ALT 0 - 44 U/L - - -      Microbiology: Recent Results (from the past 240 hour(s))  Urine Culture     Status: None   Collection Time: 09/25/2021  6:48 AM   Specimen: Urine, Random  Result Value Ref Range Status   Specimen Description   Final    URINE, RANDOM Performed at [facility], [address], [place], [place] [address]    Special Requests   Final    NONE Performed at [facility], [address], [place], [place] [address]    Culture   Final    NO GROWTH Performed at [facility], [place] [address], [place], [place] [address]    Report Status 09/02/2021 FINAL  Final  Culture, blood (routine x 2)     Status: None (Preliminary result)   Collection Time: 08/31/2021  6:58 AM   Specimen: BLOOD  Result Value Ref Range Status   Specimen Description BLOOD LEFT FA  Final   Special Requests   Final    BOTTLES DRAWN AEROBIC AND ANAEROBIC Blood Culture adequate volume   Culture   Final    NO GROWTH 2 DAYS Performed at [facility], [address] [place], [place] [address]    Report Status PENDING  Incomplete  Culture, blood (routine x 2)     Status: None (Preliminary result)   Collection Time: 09/08/2021  6:59 AM   Specimen: BLOOD  Result Value Ref Range Status   Specimen Description BLOOD RIGHT FA  Final   Special Requests   Final    BOTTLES DRAWN AEROBIC AND ANAEROBIC Blood Culture adequate  volume   Culture   Final    NO GROWTH 2 DAYS Performed at [facility], [address] [place], [place] [address]    Report Status PENDING  Incomplete  Resp Panel by RT-PCR (Flu A&B, Covid) Nasopharyngeal Swab     Status: None   Collection Time: 09/19/2021  7:44 AM   Specimen: Nasopharyngeal Swab; Nasopharyngeal(NP) swabs in vial transport medium  Result Value Ref Range Status   SARS Coronavirus 2 by RT  PCR NEGATIVE NEGATIVE Final    Comment: (NOTE) SARS-CoV-2 target nucleic acids are NOT DETECTED.  The SARS-CoV-2 RNA is generally detectable in upper respiratory specimens during the acute phase of infection. The lowest concentration of SARS-CoV-2 viral copies this assay can detect is 138 copies/mL. A negative result does not preclude SARS-Cov-2 infection and should not be used as the sole basis for treatment or other patient management decisions. A negative result may occur with  improper specimen collection/handling, submission of specimen other than nasopharyngeal swab, presence of viral mutation(s) within the areas targeted by this assay, and inadequate number of viral copies(<138 copies/mL). A negative result must be combined with clinical observations, patient history, and epidemiological information. The expected result is Negative.  Fact Sheet for Patients:  EntrepreneurPulse.com.au  Fact Sheet for Healthcare Providers:  IncredibleEmployment.be  This test is no t yet approved or cleared by the [place] FDA and  has been authorized for detection and/or diagnosis of SARS-CoV-2 by FDA under an Emergency Use Authorization (EUA). This EUA will remain  in effect (meaning this test can be used) for the duration of the COVID-19 declaration under Section 564(b)(1) of the Act, 21 U.S.C.section 360bbb-3(b)(1), unless the authorization is terminated  or revoked sooner.       Influenza A by PCR NEGATIVE NEGATIVE Final   Influenza  B by PCR NEGATIVE NEGATIVE Final    Comment: (NOTE) The Xpert Xpress SARS-CoV-2/FLU/RSV plus assay is intended as an aid in the diagnosis of influenza from Nasopharyngeal swab specimens and should not be used as a sole basis for treatment. Nasal washings and aspirates are unacceptable for Xpert Xpress SARS-CoV-2/FLU/RSV testing.  Fact Sheet for Patients: EntrepreneurPulse.com.au  Fact Sheet for Healthcare Providers: IncredibleEmployment.be  This test is not yet approved or cleared by the [place] FDA and has been authorized for detection and/or diagnosis of SARS-CoV-2 by FDA under an Emergency Use Authorization (EUA). This EUA will remain in effect (meaning this test can be used) for the duration of the COVID-19 declaration under Section 564(b)(1) of the Act, 21 U.S.C. section 360bbb-3(b)(1), unless the authorization is terminated or revoked.  Performed at [facility], [address] [place], [place] [address]   Aerobic/Anaerobic Culture w Gram Stain (surgical/deep wound)     Status: None (Preliminary result)   Collection Time: 09/18/2021  8:16 AM   Specimen: Wound  Result Value Ref Range Status   Specimen Description   Final    WOUND Performed at [facility], [address] [place], [place] [address]    Special Requests RIGHT FOOT  Final   Gram Stain   Final    MODERATE WBC PRESENT, PREDOMINANTLY MONONUCLEAR MODERATE GRAM POSITIVE COCCI IN PAIRS AND CHAINS MODERATE GRAM NEGATIVE RODS    Culture   Final    RARE STAPHYLOCOCCUS AUREUS SUSCEPTIBILITIES TO FOLLOW WITHIN MIXED ORGANISMS Performed at [facility], [place] [address] [place], [place] [address]    Report Status PENDING  Incomplete    IMAGING RESULTS: I have personally reviewed the films ? Impression/Recommendation ? ?Encephalopathy- combination of infection/ medication/hypercalcemia Will DC cefepime and metronidazole both of which have the  potential to cause encephalopathy= start zosyn instead  Sepsis secondary to the rt foot infection S/p Guillotine amputation rt foot No culture sent Continue vanci/zosyn Past h/o TMA Past h/o osteo and needing prolonged course sof IV antibiotics  Poorly controlled DM  HTN  Hyperlipidemia on atorvastatin  Hypercalcemia- due to likely primary hyperparathyroidism- nephrology following him Got pamidronate  IV   ? ___________________________________________________ Discussed with care team Note:  This document was prepared using Dragon voice recognition software and may include unintentional dictation errors.

## 2021-09-09 NOTE — Anesthesia Postprocedure Evaluation (Signed)
Anesthesia Post Note  Patient: [name]  Procedure(s) Performed: RIGHT OPEN GUILLOTINE BELOW KNEE AMPUTATION (Right: Leg Lower) APPLICATION OF WOUND VAC (Right: Leg Lower)  Patient location during evaluation: PACU Anesthesia Type: General Post-procedure mental status: preOp neuro, obtunded. Pain management: pain level controlled Vital Signs Assessment: post-procedure vital signs reviewed and stable Respiratory status: spontaneous breathing, nonlabored ventilation, respiratory function stable and patient connected to nasal cannula oxygen Cardiovascular status: blood pressure returned to baseline and stable Postop Assessment: no apparent nausea or vomiting Anesthetic complications: no   No notable events documented.   Last Vitals:  Vitals:   09/08/2021 1215 09/26/2021 1220  BP:  120/75  Pulse: 96 97  Resp: (!) 30 (!) 28  Temp:    SpO2: 99% 97%    Last Pain:  Vitals:   09/26/2021 1210  TempSrc:   PainSc: 0-No pain                 Precious Haws Quintavious Rinck

## 2021-09-09 NOTE — Consult Note (Signed)
WOC consulted for right foot wound 12/10, no WOC on this campus until today. Reviewed chart.  Patient with +osteo and gas for RAKA per vascular once patient has been cleared medically for surgery Will not consult for this reason. Gauze dressings are fine until surgical intervention.  Re consult if needed, will not follow at this time. Thanks  Adhrit Krenz [employer], RN,CWOCN, CNS, [place] [phone])

## 2021-09-10 ENCOUNTER — Inpatient Hospital Stay: Payer: Medicaid Other

## 2021-09-10 ENCOUNTER — Other Ambulatory Visit (INDEPENDENT_AMBULATORY_CARE_PROVIDER_SITE_OTHER): Payer: Self-pay | Admitting: Vascular Surgery

## 2021-09-10 ENCOUNTER — Encounter: Payer: Self-pay | Admitting: Vascular Surgery

## 2021-09-10 DIAGNOSIS — Z89511 Acquired absence of right leg below knee: Secondary | ICD-10-CM

## 2021-09-10 DIAGNOSIS — I214 Non-ST elevation (NSTEMI) myocardial infarction: Secondary | ICD-10-CM

## 2021-09-10 DIAGNOSIS — E871 Hypo-osmolality and hyponatremia: Secondary | ICD-10-CM

## 2021-09-10 DIAGNOSIS — R6520 Severe sepsis without septic shock: Secondary | ICD-10-CM

## 2021-09-10 DIAGNOSIS — F10231 Alcohol dependence with withdrawal delirium: Secondary | ICD-10-CM

## 2021-09-10 DIAGNOSIS — R063 Periodic breathing: Secondary | ICD-10-CM

## 2021-09-10 LAB — CBC
HCT: 36.5 % — ABNORMAL LOW (ref 39.0–52.0)
Hemoglobin: 12.3 g/dL — ABNORMAL LOW (ref 13.0–17.0)
MCH: 31.1 pg (ref 26.0–34.0)
MCHC: 33.7 g/dL (ref 30.0–36.0)
MCV: 92.2 fL (ref 80.0–100.0)
Platelets: 151 10*3/uL (ref 150–400)
RBC: 3.96 MIL/uL — ABNORMAL LOW (ref 4.22–5.81)
RDW: 12.2 % (ref 11.5–15.5)
WBC: 7.2 10*3/uL (ref 4.0–10.5)
nRBC: 0 % (ref 0.0–0.2)

## 2021-09-10 LAB — BLOOD GAS, VENOUS
Acid-Base Excess: 7.6 mmol/L — ABNORMAL HIGH (ref 0.0–2.0)
Bicarbonate: 33.2 mmol/L — ABNORMAL HIGH (ref 20.0–28.0)
O2 Saturation: 31.2 %
Patient temperature: 37
pCO2, Ven: 50 mmHg (ref 44.0–60.0)
pH, Ven: 7.43 (ref 7.250–7.430)
pO2, Ven: <31 mmHg — CL (ref 32.0–45.0)

## 2021-09-10 LAB — BASIC METABOLIC PANEL
Anion gap: 1 — ABNORMAL LOW (ref 5–15)
BUN: 20 mg/dL (ref 6–20)
CO2: 31 mmol/L (ref 22–32)
Calcium: 13.2 mg/dL (ref 8.9–10.3)
Chloride: 104 mmol/L (ref 98–111)
Creatinine, Ser: 0.87 mg/dL (ref 0.61–1.24)
GFR, Estimated: >60 mL/min (ref >60)
Glucose, Bld: 122 mg/dL — ABNORMAL HIGH (ref 70–99)
Potassium: 3.5 mmol/L (ref 3.5–5.1)
Sodium: 136 mmol/L (ref 135–145)

## 2021-09-10 LAB — COMPREHENSIVE METABOLIC PANEL
ALT: 19 U/L (ref 0–44)
AST: 39 U/L (ref 15–41)
Albumin: 1.8 g/dL — ABNORMAL LOW (ref 3.5–5.0)
Alkaline Phosphatase: 78 U/L (ref 38–126)
Anion gap: 3 — ABNORMAL LOW (ref 5–15)
BUN: 19 mg/dL (ref 6–20)
CO2: 31 mmol/L (ref 22–32)
Calcium: 13 mg/dL — ABNORMAL HIGH (ref 8.9–10.3)
Chloride: 103 mmol/L (ref 98–111)
Creatinine, Ser: 0.82 mg/dL (ref 0.61–1.24)
GFR, Estimated: >60 mL/min (ref >60)
Glucose, Bld: 138 mg/dL — ABNORMAL HIGH (ref 70–99)
Potassium: 3.6 mmol/L (ref 3.5–5.1)
Sodium: 137 mmol/L (ref 135–145)
Total Bilirubin: 1.3 mg/dL — ABNORMAL HIGH (ref 0.3–1.2)
Total Protein: 5.7 g/dL — ABNORMAL LOW (ref 6.5–8.1)

## 2021-09-10 LAB — AMMONIA: Ammonia: 14 umol/L (ref 9–35)

## 2021-09-10 LAB — GLUCOSE, CAPILLARY
Glucose-Capillary: 111 mg/dL — ABNORMAL HIGH (ref 70–99)
Glucose-Capillary: 113 mg/dL — ABNORMAL HIGH (ref 70–99)
Glucose-Capillary: 136 mg/dL — ABNORMAL HIGH (ref 70–99)
Glucose-Capillary: 144 mg/dL — ABNORMAL HIGH (ref 70–99)
Glucose-Capillary: 150 mg/dL — ABNORMAL HIGH (ref 70–99)
Glucose-Capillary: 225 mg/dL — ABNORMAL HIGH (ref 70–99)

## 2021-09-10 LAB — BRAIN NATRIURETIC PEPTIDE: B Natriuretic Peptide: 600.5 pg/mL — ABNORMAL HIGH (ref 0.0–100.0)

## 2021-09-10 LAB — MAGNESIUM
Magnesium: 1.8 mg/dL (ref 1.7–2.4)
Magnesium: 2.1 mg/dL (ref 1.7–2.4)

## 2021-09-10 LAB — VANCOMYCIN, PEAK: Vancomycin Pk: 40 ug/mL (ref 30–40)

## 2021-09-10 LAB — PHOSPHORUS
Phosphorus: 2.3 mg/dL — ABNORMAL LOW (ref 2.5–4.6)
Phosphorus: 2.1 mg/dL — ABNORMAL LOW (ref 2.5–4.6)

## 2021-09-10 MED ORDER — OSMOLITE 1.5 CAL PO LIQD
1000.0000 mL | ORAL | Status: DC
  Administered 2021-09-10: 1000 mL

## 2021-09-10 MED ORDER — FREE WATER
30.0000 mL | Status: DC
  Administered 2021-09-10 – 2021-09-13 (×15): 30 mL

## 2021-09-10 MED ORDER — JUVEN PO PACK
1.0000 | PACK | Freq: Two times a day (BID) | ORAL | Status: DC
  Administered 2021-09-10 – 2021-09-13 (×4): 1

## 2021-09-10 MED ORDER — DIAZEPAM 5 MG PO TABS
5.0000 mg | ORAL_TABLET | Freq: Three times a day (TID) | ORAL | Status: DC
  Administered 2021-09-10 – 2021-09-13 (×11): 5 mg via NASOGASTRIC
  Filled 2021-09-10 (×11): qty 1

## 2021-09-10 MED ORDER — MAGNESIUM SULFATE 2 GM/50ML IV SOLN
2.0000 g | Freq: Once | INTRAVENOUS | Status: AC
  Administered 2021-09-10: 2 g via INTRAVENOUS
  Filled 2021-09-10: qty 50

## 2021-09-10 MED ORDER — PROSOURCE TF PO LIQD
45.0000 mL | Freq: Two times a day (BID) | ORAL | Status: DC
  Administered 2021-09-10 – 2021-09-11 (×3): 45 mL
  Filled 2021-09-10 (×5): qty 45

## 2021-09-10 MED ORDER — FUROSEMIDE 10 MG/ML IJ SOLN
40.0000 mg | Freq: Once | INTRAMUSCULAR | Status: AC
  Administered 2021-09-10: 40 mg via INTRAVENOUS
  Filled 2021-09-10: qty 4

## 2021-09-10 MED ORDER — ALBUMIN HUMAN 25 % IV SOLN
25.0000 g | Freq: Four times a day (QID) | INTRAVENOUS | Status: AC
  Administered 2021-09-10 – 2021-09-11 (×4): 25 g via INTRAVENOUS
  Filled 2021-09-10 (×4): qty 100

## 2021-09-10 MED ORDER — PANTOPRAZOLE SODIUM 40 MG IV SOLR
40.0000 mg | INTRAVENOUS | Status: DC
  Administered 2021-09-10 – 2021-09-13 (×4): 40 mg via INTRAVENOUS
  Filled 2021-09-10 (×4): qty 40

## 2021-09-10 MED ORDER — PIPERACILLIN-TAZOBACTAM 3.375 G IVPB
3.3750 g | Freq: Three times a day (TID) | INTRAVENOUS | Status: DC
  Administered 2021-09-10 – 2021-09-13 (×11): 3.375 g via INTRAVENOUS
  Filled 2021-09-10 (×11): qty 50

## 2021-09-10 MED ORDER — POTASSIUM PHOSPHATES 15 MMOLE/5ML IV SOLN
15.0000 mmol | Freq: Once | INTRAVENOUS | Status: AC
  Administered 2021-09-10: 15 mmol via INTRAVENOUS
  Filled 2021-09-10: qty 5

## 2021-09-10 NOTE — Progress Notes (Signed)
[facility] Daily Progress Note  09/21/2021: Open amputation (guillotine) right lower leg with VAC dressing placement  Subjective: Patient lying in bed grunting.  Objective: Vitals:   09/10/21 0400 09/10/21 0500 09/10/21 0600 09/10/21 0748  BP: 121/84 123/88 126/71 (!) 146/67  Pulse: (!) 102 100 (!) 103 (!) 104  Resp: 20 (!) 31 19 (!) 21  Temp: 98.9 F (37.2 C)   99.5 F (37.5 C)  TempSrc: Axillary   Axillary  SpO2: 99% 91% 96% 94%  Weight:      Height:        Intake/Output Summary (Last 24 hours) at 09/10/2021 1123 Last data filed at 09/10/2021 1000 Gross per 24 hour  Intake 1187.51 ml  Output 1450 ml  Net -262.49 ml    Physical Exam: A&Ox3, NAD CV: RRR Pulmonary: CTA Bilaterally Abdomen: Soft, Nontender, Nondistended Vascular:  VAC: Good seal and to suction.   Laboratory: CBC    Component Value Date/Time   WBC 7.2 09/10/2021 0402   HGB 12.3 (L) 09/10/2021 0402   HCT 36.5 (L) 09/10/2021 0402   PLT 151 09/10/2021 0402   BMET    Component Value Date/Time   NA 137 09/10/2021 0402   K 3.6 09/10/2021 0402   CL 103 09/10/2021 0402   CO2 31 09/10/2021 0402   GLUCOSE 138 (H) 09/10/2021 0402   BUN 19 09/10/2021 0402   CREATININE 0.82 09/10/2021 0402   CALCIUM 13.0 (H) 09/10/2021 0402   GFRNONAA >60 09/10/2021 0402   GFRAA >60 06/12/2019 1556   Assessment/Planning: Plan on returning to the OR Thursday the patient is stable enough for completion most likely above-the-knee amputation.  Patient had large amounts of pus draining from the calf.  Unsure if he would be able to heal a below the knee amputation.  Marcelle Overlie PA-C 09/10/2021 11:23 AM

## 2021-09-10 NOTE — Progress Notes (Addendum)
NAME:  [name], MRN:  [id number], DOB:  1966/01/10, LOS: 3 ADMISSION DATE:  09/06/2021, CONSULTATION DATE:  09/20/2021 REFERRING MD:  Dr. Kurtis Bushman, CHIEF COMPLAINT:   Rt Foot pain  Pertinent  Medical History  T2DM HTN ETOH Dyslipidemia TMA of the RIGHT foot 05/14/21 Brief HPI:  55 year old male presenting to [facility] ED on 08/30/2021 with complaints of feeling tired over the past day in the setting of of a wound on the lateral aspect of his right foot with foul-smelling drainage and increased pain. 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  Post surgery he followed up with the podiatrist once on 07/09/2021 at which time he had a 2 x 2 full-thickness ulcer without signs of infection. Upon arrival patient was tachycardic in A-fib RVR, tachypneic with elevated lactic acid and procalcitonin levels> he was worked up per sepsis protocol with IV fluid and empiric antibiotic therapy.  There was also concern for possible NSTEMI and a heparin drip was started.   Hospital course: Podiatry consulted whose recommendations were vascular surgical intervention for potential amputation of RLE due to the concern for osteomyelitis & x-rays revealing soft tissue gas. Vascular surgery recommended right trans tibial amputation, which was performed 09/02/2021. Cardiology following due to N-STEMI, HFrEF and PAF on heparin drip with plans for ischemic work-up once stabilized from surgery & infection. Nephrology consulted for guidance on hypercalcemia, and ID following to assist with treatment of R ft infection. Patient continues to be altered in the setting of ETOH use & infection. [place] 09/08/21: negative for acute abnormality. Post-op the patient was lethargic & hypotensive after receiving morphine & ativan and transferred to SDU.  PCCM later consulted to assist with management and monitoring.  Significant Hospital Events: Including procedures, antibiotic start and stop dates in addition to other  pertinent events   09/23/2021: Admitted by [facility] with sepsis s/t R infection/ N-STEMI/ A-fib RVR with X-ray showing soft tissue gas. Podiatry & vascular consulted 09/08/21: Pt confused, CTH negative, CIWA initiated. Cardiology consulted for management of N-STEMI & A-fib RVR with plans for ischemic w/u after vascular intervention and pt stabilization 09/25/2021: status post guillotine R BKA, in post op patient lethargic & hypotensive transferred to SDU. PCCM consulted.  Interim History / Subjective:  Patient similar to post-op description, somnolent responsive to pain & physical stimuli, moving all extremities with some groaning. VSS.  Objective   Blood pressure (!) 146/67, pulse (!) 104, temperature 99.5 F (37.5 C), temperature source Axillary, resp. rate (!) 21, height 6\' 3"  (1.905 m), weight 94.8 kg, SpO2 94 %.        Intake/Output Summary (Last 24 hours) at 09/10/2021 0818 Last data filed at 09/10/2021 0508 Gross per 24 hour  Intake 1057.51 ml  Output 850 ml  Net 207.51 ml    Filed Weights   09/24/2021 0648 09/02/2021 1848  Weight: 93 kg 94.8 kg   Physical Exam Vitals reviewed.  Constitutional:      General: He is not in acute distress.    Appearance: He is obese. He is ill-appearing.     Interventions: He is not intubated. HENT:     Head: Normocephalic and atraumatic.     Right Ear: External ear normal.     Left Ear: External ear normal.     Mouth/Throat:     Mouth: Mucous membranes are moist.     Pharynx: No posterior oropharyngeal erythema.  Eyes:     General: No scleral icterus.  Extraocular Movements: Extraocular movements intact.     Pupils: Pupils are equal, round, and reactive to light.  Cardiovascular:     Rate and Rhythm: Tachycardia present. Occasional Extrasystoles are present.    Pulses: Normal pulses.     Heart sounds: Normal heart sounds.  Pulmonary:     Effort: No accessory muscle usage, respiratory distress or retractions. He is not intubated.     Breath  sounds: Examination of the right-lower field reveals decreased breath sounds. Examination of the left-lower field reveals decreased breath sounds. Decreased breath sounds present. No wheezing, rhonchi or rales.  Abdominal:     General: Abdomen is protuberant. Bowel sounds are normal. There is no distension.     Palpations: Abdomen is soft.     Tenderness: There is no abdominal tenderness. There is no guarding or rebound.  Musculoskeletal:     Left lower leg: Edema present.     Comments: RIGHT BKA, post op dressing in place  Neurological:     Mental Status: He is lethargic.     GCS: GCS eye subscore is 3. GCS verbal subscore is 2. GCS motor subscore is 5.     ASSESSMENT & PLAN   RESP: -Remains on RA or low [place] -Initial CXR clear lung fields -demonstratin CSR at this time, with element of OSA (likely mandibular geometry) -New CXR today -Trend BNP   NEURO: Acute Encephalopathy secondary to suspected withdrawal in the setting of ETOH use & infection CTH 12/11 negative, STAT ABG: 7.49/40/69/30.5, Lactic: 1.3 - Exclude CIWA, AMS may be multi-factorial  -Replace with standing low dose of Valium and monitor - Folic acid, multi-vitamin daily - high dose thiamine taper - limit sedating medications as tolerated, consider precedex if patient becomes agitated - seizure & fall precautions - NGT placed to facilitate PO medications - HIGH RISK for intubation > currently protecting airway - Ammonia level 14/WNL  ID: Sepsis without septic shock secondary to acute osteomyelitis in the setting of 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 Lab Results  Component Value Date   WBC 7.2 09/10/2021   Temp (24hrs), Avg:98.8 F (37.1 C), Min:97 F (36.1 C), Max:101.1 F (38.4 C)  Lactic: 1.3  Specimen Description WOUND  Performed at [facility], [address] [place], [place] [address]   Special Requests RIGHT FOOT   Gram Stain MODERATE WBC PRESENT, PREDOMINANTLY MONONUCLEAR   MODERATE GRAM POSITIVE COCCI IN PAIRS AND CHAINS  MODERATE GRAM NEGATIVE RODS  Performed at [facility], [place] [address], [place], [place] [address]   Culture RARE STAPHYLOCOCCUS AUREUS  SUSCEPTIBILITIES TO FOLLOW  WITHIN MIXED ORGANISMS  MIXED ANAEROBIC FLORA PRESENT.  CALL LAB IF FURTHER IID REQUIRED.    - Daily CBC, monitor WBC/ fever curve, lactic acid WNL - IV antibiotics: zosyn & vancomycin * per ID note will need prolonged course due to PMHx of osteomyelitis - Strict I/O's: alert provider if UOP < 0.5 mL/kg/hr - ID consulted, appreciate input - vascular following, appreciate input  CV: ECHO 09/11/2021 IMPRESSIONS:  1. Left ventricular ejection fraction, by estimation, is 30 to 35%.       Regional wall motion abnormalities and is mildly dilated.         -there is severe hypokinesis of the left ventricular,         -mid-apical anteroseptal wall and anterior wall.          -akinesis of the left ventricular, apical segment.  There is mild left ventricular hypertrophy.        Left ventricular diastolic parameters are indeterminate.    2. Right ventricular systolic function is normal.        The right ventricular  size is normal.    3. Left atrial size was moderately dilated.    4. No significant valvular dysfunction noted   HFrEF/ Presumed ICM- LVEF 30-35%, severe hypokinesis N-Stemi- troponin peak 5943 PAF- currently NSR - continue metoprolol, statin, ASA via NGT - if A-fib recurs consider amiodarone - continuous cardiac monitoring - Cardiology consulted, appreciate input > plan for ischemic w/u and cath once stabilized  RENAL/FEN:  Intake/Output Summary (Last 24 hours) at 09/10/2021 0854 Last data filed at 09/10/2021 3716 Gross per 24 hour  Intake 1057.51 ml  Output 850 ml  Net 207.51 ml   Net IO Since Admission: 5,569.38 mL [09/10/21 0854]  Hypercalcemia s/t Hyperparathyroidism 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 No  significant sonographic abnormality of the thyroid. 2. 1.5 x 1.3 x 1.3 cm hypoechoic mass seen inferior and posterior to the inferior tip of the left thyroid lobe is suspicious for a parathyroid adenoma.  - Sensipar started, only 1 dose received due to lethargy, unable to give via NGT > Rx looking into alternative options for NGT administration - Would force diurese at this time with lasix dosing to account for IVF for mild positive balance - Nephrology following, appreciate input  ENDO: Uncontrolled Type 2 Diabetes Mellitus Hemoglobin A1C: 8.2 - Monitor CBG Q 4 hours while NPO - SSI on hold while PO intake poor: CBG's 100-150 (12/12) - target range while in ICU: 140-180 - follow ICU hyper/hypo-glycemia protocol  Best Practice (right click and "Reselect all SmartList Selections" daily)  Diet/type: NPO w/ meds via tube DVT prophylaxis: LMWH GI prophylaxis: PPI Lines: N/A Foley:  N/A Code Status:  full code Last date of multidisciplinary goals of care discussion [09/24/2021]  Labs   CBC: Recent Labs  Lab 08/29/2021 0658 09/08/21 0616 09/11/2021 0259 09/10/21 0402  WBC 11.2* 12.4* 8.2 7.2  NEUTROABS 10.6*  --   --   --   HGB 14.4 12.6* 11.9* 12.3*  HCT 41.1 36.2* 34.9* 36.5*  MCV 88.6 87.7 89.3 92.2  PLT 185 164 153 151     Basic Metabolic Panel: Recent Labs  Lab 09/11/2021 0658 09/16/2021 0912 09/08/21 0616 09/08/21 1918 09/21/2021 0259 09/08/2021 2315 09/10/21 0402  NA 128*  --  128* 132* 133* 136 137  K 4.0  --  3.2*  --  3.0* 3.5 3.6  CL 87*  --  94*  --  100 104 103  CO2 27  --  29  --  28 31 31   GLUCOSE 317*  --  126*  --  94 122* 138*  BUN 18  --  16  --  16 20 19   CREATININE 0.90  --  0.66  --  0.62 0.87 0.82  CALCIUM 13.6*  --  13.3*  --  13.6* 13.2* 13.0*  MG  --  1.0* 1.6*  --  1.5* 1.8 2.1  PHOS  --   --   --   --  1.6* 2.3* 2.1*    GFR: Estimated Creatinine Clearance: 121.7 mL/min (by C-G formula based on SCr of 0.82 mg/dL). Recent Labs  Lab  09/28/2021 0658 08/29/2021 0912 09/14/2021 1206 09/17/2021 1444 09/08/21 0616 09/21/2021 0259 09/11/2021 1935 09/10/21 0402  PROCALCITON 15.80  --   --   --  8.58  --   --   --  WBC 11.2*  --   --   --  12.4* 8.2  --  7.2  LATICACIDVEN 2.9* 2.9* 2.8* 2.8*  --   --  1.3  --      Liver Function Tests: Recent Labs  Lab 09/26/2021 0658 09/10/21 0402  AST 33 39  ALT 23 19  ALKPHOS 94 78  BILITOT 3.1* 1.3*  PROT 6.4* 5.7*  ALBUMIN 2.6* 1.8*    No results for input(s): LIPASE, AMYLASE in the last 168 hours. Recent Labs  Lab 09/10/21 0402  AMMONIA 14    ABG    Component Value Date/Time   PHART 7.49 (H) 09/20/2021 1910   PCO2ART 40 09/24/2021 1910   PO2ART 69 (L) 09/28/2021 1910   HCO3 33.2 (H) 09/10/2021 0402   O2SAT 31.2 09/10/2021 0402      Coagulation Profile: Recent Labs  Lab 09/15/2021 0659 09/18/2021 0259  INR 1.2 1.6*     Cardiac Enzymes: No results for input(s): CKTOTAL, CKMB, CKMBINDEX, TROPONINI in the last 168 hours.  HbA1C: Hgb A1c MFr Bld  Date/Time Value Ref Range Status  09/23/2021 06:58 AM 8.2 (H) 4.8 - 5.6 % Final    Comment:    (NOTE)         Prediabetes: 5.7 - 6.4         Diabetes: >6.4         Glycemic control for adults with diabetes: <7.0   05/10/2021 05:00 AM 6.4 (H) 4.8 - 5.6 % Final    Comment:    (NOTE) Pre diabetes:          5.7%-6.4%  Diabetes:              >6.4%  Glycemic control for   <7.0% adults with diabetes     CBG: Recent Labs  Lab 09/06/2021 1851 09/03/2021 2117 09/10/21 0016 09/10/21 0515 09/10/21 0734  GLUCAP 125* 114* 111* 113* 136*     Review of Systems:   UTA- patient somnolent unable to participate in interview  Past Medical History:  He,  has a past medical history of Diabetes ([place]) and Hypertension.   Surgical History:   Past Surgical History:  Procedure Laterality Date   AMPUTATION Right 09/27/2021   Procedure: RIGHT OPEN GUILLOTINE BELOW KNEE AMPUTATION;  Surgeon: Algernon Huxley, MD;  Location: [facility]  ORS;  Service: Vascular;  Laterality: Right;   AMPUTATION TOE Right 07/11/2020   Procedure: AMPUTATION TOE-Right Great Toe;  Surgeon: Samara Deist, DPM;  Location: [facility] ORS;  Service: Podiatry;  Laterality: Right;   APPLICATION OF WOUND VAC Right 09/25/2021   Procedure: APPLICATION OF WOUND VAC;  Surgeon: Algernon Huxley, MD;  Location: [facility] ORS;  Service: Vascular;  Laterality: Right;   I & D EXTREMITY Right 05/14/2021   Procedure: IRRIGATION AND DEBRIDEMENT EXTREMITY- RIGHT FOOT;  Surgeon: Samara Deist, DPM;  Location: [facility] ORS;  Service: Podiatry;  Laterality: Right;   IRRIGATION AND DEBRIDEMENT ABSCESS Right 10/31/2018   Procedure: IRRIGATION AND DEBRIDEMENT ABSCESS;  Surgeon: Samara Deist, DPM;  Location: [facility] ORS;  Service: Podiatry;  Laterality: Right;   IRRIGATION AND DEBRIDEMENT FOOT Right 11/02/2018   Procedure: IRRIGATION AND DEBRIDEMENT FOOT;  Surgeon: Sharlotte Alamo, DPM;  Location: [facility] ORS;  Service: Podiatry;  Laterality: Right;   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   Procedure: TRANSMETATARSAL AMPUTATION;  Surgeon: Samara Deist, DPM;  Location: [facility] ORS;  Service: Podiatry;  Laterality: Right;     Social History:   reports that he has been smoking cigars. He has  never used smokeless tobacco. He reports current alcohol use. He reports that he does not use drugs.   Family History:  His family history includes CAD in his brother and father.   Allergies No Known Allergies   Home Medications  Prior to Admission medications   Medication Sig Start Date End Date Taking? Authorizing Provider  acetaminophen (TYLENOL) 325 MG tablet Take 2 tablets (650 mg total) by mouth every 6 (six) hours as needed for mild pain (or Fever >/= 101). 05/22/21  Yes Arrien, Jimmy Picket, MD  oxyCODONE (OXY IR/ROXICODONE) 5 MG immediate release tablet Take 1 tablet (5 mg total) by mouth every 6 (six) hours as needed for moderate pain. 05/22/21  Yes Arrien, Jimmy Picket, MD   amoxicillin-clavulanate (AUGMENTIN) 875-125 MG tablet Take 1 tablet by mouth 2 (two) times daily  for 14 days. Patient not taking: Reported on 09/19/2021 06/13/21   Tsosie Billing, MD  atorvastatin (LIPITOR) 40 MG tablet Take 40 mg by mouth daily.    [provider]  glipiZIDE (GLUCOTROL XL) 10 MG 24 hr tablet Take 20 mg by mouth daily with breakfast. Patient not taking: Reported on 09/14/2021    [provider]     Critical Care Time devoted to patient care services described in this note is ... minutes.  Overall, patient is critically ill, prognosis is guarded.   Patient with Multiorgan failure and at high risk for cardiac arrest and death.  Images reviewed directly and interpretation in A&P is my own unless noted Labs reviewed and evaluated as noted in A&P  Critical care time: 45 minutes   //Ether Wolters

## 2021-09-10 NOTE — Progress Notes (Signed)
Pharmacy Antibiotic Note  [name] is a 55 y.o. male admitted on 09/24/2021. Pharmacy has been consulted for Zosyn dosing for diabetic foot infection per ID provider.  Pharmacy still consulted for Vancomycin.  Cefepime order/consult discontinued.  Plan: Zosyn 3.375 (extended infusion) q8hr per indication and renal function.   Height: 6\' 3"  (190.5 cm) Weight: 94.8 kg (208 lb 15.9 oz) IBW/kg (Calculated) : 84.5  Temp (24hrs), Avg:98.7 F (37.1 C), Min:97 F (36.1 C), Max:101.1 F (38.4 C)  Recent Labs  Lab 09/20/2021 0658 09/27/2021 0912 09/02/2021 1206 09/10/2021 1444 09/08/21 0616 09/06/2021 0259 09/27/2021 1935 09/18/2021 2315  WBC 11.2*  --   --   --  12.4* 8.2  --   --   CREATININE 0.90  --   --   --  0.66 0.62  --  0.87  LATICACIDVEN 2.9* 2.9* 2.8* 2.8*  --   --  1.3  --      Estimated Creatinine Clearance: 114.7 mL/min (by C-G formula based on SCr of 0.87 mg/dL).    No Known Allergies  Antimicrobials this admission: Cefepime 12/10 >> 12/12 Vancomycin 12/10 >> Zosyn 12/13 >>   Microbiology results: 12/10 BCx: 12/13 GPR (1 of 4) 12/10 Cx (right foot): GPC & GNR, mixed flora  Thank you for allowing pharmacy to be a part of this patient's care.  Renda Rolls, PharmD, [facility] 09/10/2021 3:21 AM

## 2021-09-10 NOTE — Progress Notes (Signed)
PHARMACY - PHYSICIAN COMMUNICATION CRITICAL VALUE ALERT - BLOOD CULTURE IDENTIFICATION (BCID)  Pharmacy notified 1 anaerobic of 4 bottles with GPC, sending to [facility] for further identification.  Name of ID physician following:  J. Delaine Lame, MD  Prior to notifying rounding provider, ID provider updated abx orders.  Pharmacy consulted to dose Zosyn, continuing Vancomycin, Cefepime discontinued.   Renda Rolls, PharmD, [facility] 09/10/2021 4:07 AM

## 2021-09-10 NOTE — Progress Notes (Signed)
Progress Note  Patient Name: [name] Date of Encounter: 09/10/2021  Primary Cardiologist: Ida Rogue, MD  Subjective   The patient was transferred to the ICU yesterday due to postoperative confusion and agitation with hypotension.  The patient's ex-girlfriend and sister are present today at the bedside.  They report that he has a longstanding history of heavy alcohol use.  The patient is still agitated and not following commands.  Inpatient Medications    Scheduled Meds:  aspirin EC  81 mg Oral Daily   atorvastatin  40 mg Per Tube QPM   Chlorhexidine Gluconate Cloth  6 each Topical Daily   cinacalcet  30 mg Oral Q supper   diazepam  5 mg Per NG tube Q8H   enoxaparin (LOVENOX) injection  40 mg Subcutaneous Q24H   feeding supplement (PROSource TF)  45 mL Per Tube BID   folic acid  1 mg Per Tube Daily   free water  30 mL Per Tube Q4H   insulin aspart  0-15 Units Subcutaneous Q4H   metoprolol tartrate  25 mg Per Tube BID   multivitamin with minerals  1 tablet Per Tube Daily   nutrition supplement (JUVEN)  1 packet Per Tube BID BM   pantoprazole (PROTONIX) IV  40 mg Intravenous Q24H   [START ON 09/18/2021] thiamine injection  100 mg Intravenous Daily   Continuous Infusions:  sodium chloride 150 mL/hr at 09/10/21 1153   albumin human 25 g (09/10/21 1551)   feeding supplement (OSMOLITE 1.5 CAL) 1,000 mL (09/10/21 1602)   piperacillin-tazobactam (ZOSYN)  IV 3.375 g (09/10/21 1550)   thiamine injection 500 mg (09/10/21 1717)   Followed by   Derrill Memo ON 09/21/2021] thiamine injection     vancomycin 1,750 mg (09/10/21 0826)   PRN Meds: acetaminophen, acetaminophen, morphine injection, ondansetron **OR** ondansetron (ZOFRAN) IV, oxyCODONE   Vital Signs    Vitals:   09/10/21 0500 09/10/21 0600 09/10/21 0748 09/10/21 1200  BP: 123/88 126/71 (!) 146/67 105/65  Pulse: 100 (!) 103 (!) 104 94  Resp: (!) 31 19 (!) 21 19  Temp:   99.5 F (37.5 C)   TempSrc:   Axillary    SpO2: 91% 96% 94% 97%  Weight:      Height:        Intake/Output Summary (Last 24 hours) at 09/10/2021 1726 Last data filed at 09/10/2021 1200 Gross per 24 hour  Intake 130 ml  Output 2300 ml  Net -2170 ml    Filed Weights   09/05/2021 0648 09/11/2021 1848  Weight: 93 kg 94.8 kg    Physical Exam   GEN: Well nourished, well developed, in no acute distress.  HEENT: Grossly normal appearing. Does not open eyes for evaluation. Neck: Supple, no JVD, carotid bruits, or masses. Cardiac: RRR, no murmurs, rubs, or gallops. No clubbing, cyanosis.  2+ RLE edema w/ erythema.  R foot wrapped.  Radials 2+, L DP/PT 1+ and equal bilaterally.  Respiratory:  Respirations regular and unlabored, diminished breath sounds @ bases - poor effort. GI: Soft, nontender, nondistended, BS + x 4. MS: no deformity or atrophy. Skin: warm and dry, no rash.  Erythema to R lower leg. Neuro:  Strength and sensation are intact - unable to fully evaluate. Psych: Awake, does not respond to verbal cues.  Labs    Chemistry Recent Labs  Lab 09/11/2021 6314 09/08/21 0616 09/01/2021 0259 09/21/2021 2315 09/10/21 0402  NA 128*   < > 133* 136 137  K 4.0   < >  3.0* 3.5 3.6  CL 87*   < > 100 104 103  CO2 27   < > 28 31 31   GLUCOSE 317*   < > 94 122* 138*  BUN 18   < > 16 20 19   CREATININE 0.90   < > 0.62 0.87 0.82  CALCIUM 13.6*   < > 13.6* 13.2* 13.0*  PROT 6.4*  --   --   --  5.7*  ALBUMIN 2.6*  --   --   --  1.8*  AST 33  --   --   --  39  ALT 23  --   --   --  19  ALKPHOS 94  --   --   --  78  BILITOT 3.1*  --   --   --  1.3*  GFRNONAA >60   < > >60 >60 >60  ANIONGAP 14   < > 5 1* 3*   < > = values in this interval not displayed.      Hematology Recent Labs  Lab 09/08/21 0616 09/25/2021 0259 09/10/21 0402  WBC 12.4* 8.2 7.2  RBC 4.13* 3.91* 3.96*  HGB 12.6* 11.9* 12.3*  HCT 36.2* 34.9* 36.5*  MCV 87.7 89.3 92.2  MCH 30.5 30.4 31.1  MCHC 34.8 34.1 33.7  RDW 11.9 12.1 12.2  PLT 164 153 151      Cardiac Enzymes  Recent Labs  Lab 09/06/2021 0658 09/22/2021 0912 09/25/2021 1206 09/08/2021 1444  TROPONINIHS 5,943* 5,878* 4,796* 4,189*      HbA1c  Lab Results  Component Value Date   HGBA1C 8.2 (H) 09/24/2021    Radiology    DG Chest 1 View  Result Date: 09/08/2021 CLINICAL DATA:  Sepsis. EXAM: CHEST  1 VIEW COMPARISON:  None. FINDINGS: The heart size and mediastinal contours are within normal limits. Both lungs are clear. The visualized skeletal structures are unremarkable. IMPRESSION: No active disease. Electronically Signed   By: Marijo Conception M.D.   On: 09/22/2021 07:50   DG Tibia/Fibula Right  Result Date: 09/03/2021 CLINICAL DATA:  Wound. EXAM: RIGHT TIBIA AND FIBULA - 2 VIEW COMPARISON:  None. FINDINGS: There is no evidence of fracture or other focal bone lesions. Soft tissue gas is seen in the ankle and lower calf region concerning for possible cellulitis. IMPRESSION: Soft tissue gas seen in distal calf soft tissues concerning for cellulitis. No definite bony abnormality is noted. Electronically Signed   By: Marijo Conception M.D.   On: 09/20/2021 10:54   DG Ankle Complete Right  Result Date: 09/24/2021 CLINICAL DATA:  Right ankle wound. EXAM: RIGHT ANKLE - COMPLETE 3+ VIEW COMPARISON:  None. FINDINGS: There is no evidence of fracture, dislocation, or joint effusion. There is no evidence of arthropathy or other focal bone abnormality. Status post transmetatarsal amputation of the foot. Soft tissue gas is seen overlying lateral malleolus concerning for cellulitis. No definite lytic destruction is seen to suggest osteomyelitis. IMPRESSION: Soft tissue gas seen overlying lateral malleolus concerning for cellulitis. No definite lytic destruction is seen to suggest osteomyelitis. Electronically Signed   By: Marijo Conception M.D.   On: 09/06/2021 10:52   CT HEAD WO CONTRAST (5MM)  Result Date: 09/08/2021 CLINICAL DATA:  Altered level of consciousness, right foot wound,  hypertension, diabetes EXAM: CT HEAD WITHOUT CONTRAST TECHNIQUE: Contiguous axial images were obtained from the base of the skull through the vertex without intravenous contrast. COMPARISON:  02/06/2019 FINDINGS: Evaluation is extremely limited due to patient motion  throughout the exam. Multiple attempts were made at acquiring diagnostic images. Brain: Limited evaluation due to patient motion demonstrates no signs of acute infarct or hemorrhage. The lateral ventricles and midline structures are unremarkable. No acute extra-axial fluid collections. No mass effect. Vascular: No hyperdense vessel or unexpected calcification. Skull: Normal. Negative for fracture or focal lesion. Sinuses/Orbits: No acute finding. Other: None. IMPRESSION: 1. Limited evaluation due to patient motion. 2. No acute intracranial process. Electronically Signed   By: Randa Ngo M.D.   On: 09/08/2021 17:40   MR ANKLE RIGHT WO CONTRAST  Result Date: 09/08/2021 CLINICAL DATA:  Osteomyelitis suspected. Ulcer at fifth metatarsal base, gas on x-rays. EXAM: MRI OF THE RIGHT ANKLE WITHOUT CONTRAST TECHNIQUE: Multiplanar, multisequence MR imaging of the ankle was performed. No intravenous contrast was administered. COMPARISON:  Radiograph dated September 07, 2021 FINDINGS: TENDONS Peroneal: Peroneal longus tendon intact. Peroneal brevis intact. There is fluid tracking along the peroneus longus and brevis concerning for tenosynovitis. Posteromedial: Posterior tibial tendon intact. Flexor hallucis longus tendon intact. Flexor digitorum longus tendon intact. Fluid tracking along the flexor tendons. Anterior: Tibialis anterior tendon intact. Extensor hallucis longus tendon intact Extensor digitorum longus tendon intact. Achilles:  Mild tendinopathy without discrete tear. Plantar Fascia: Normal in size and signal. LIGAMENTS Lateral: Anterior talofibular ligament intact. Calcaneofibular ligament intact. Posterior talofibular ligament intact. Anterior  and posterior tibiofibular ligaments intact. Medial: Deltoid ligament intact. Spring ligament intact. CARTILAGE Ankle Joint: No joint effusion. Normal ankle mortise. No chondral defect. Subtalar Joints/Sinus Tarsi: Normal subtalar joints. No subtalar joint effusion. Normal sinus tarsi. Bones: Bone marrow edema about the base of the fifth metatarsal and cuboid. Normal marrow signal of the distal tibia/fibula, talus. Heterogeneous signal of the calcaneus without evidence of acute fracture. Postsurgical changes for prior amputation through the base of the metatarsals. Soft Tissue: Skin thickening and subcutaneous soft tissue edema. Skin irregularity about the lateral aspect of the base of the fifth metatarsal. IMPRESSION: 1. Bone marrow edema about the base of the fifth metatarsal and cuboid bone, which in the presence of adjacent skin defect is highly suspicious for acute osteomyelitis. 2. Generalized subcutaneous soft tissue edema concerning for cellulitis. No drainable fluid collection or abscess. 3. Fluid tracking along the flexor and peroneal compartments tendon concerning for tenosynovitis. Electronically Signed   By: Keane Police D.O.   On: 09/08/2021 08:24   DG Foot Complete Right  Result Date: 09/20/2021 CLINICAL DATA:  Right foot infection.  Open wound. EXAM: RIGHT FOOT COMPLETE - 3+ VIEW COMPARISON:  May 09, 2021. FINDINGS: Status post transmetatarsal amputation of the foot. Large soft tissue ulceration is seen overlying the residual portion of the proximal fifth metatarsal. There is irregularity involving this bone, but it is uncertain if it represents postsurgical change or possibly osteomyelitis. IMPRESSION: Large soft tissue ulceration is seen overlying the residual portion of proximal fifth metatarsal. There is noted irregularity involving this bone, but it is uncertain if it represents postsurgical change or possibly osteomyelitis. Electronically Signed   By: Marijo Conception M.D.   On:  09/21/2021 07:53   US THYROID  Result Date: 09/28/2021 CLINICAL DATA:  Hypercalcemia EXAM: THYROID ULTRASOUND TECHNIQUE: Ultrasound examination of the thyroid gland and adjacent soft tissues was performed. COMPARISON:  None. FINDINGS: Parenchymal Echotexture: Normal Isthmus: 0.5 cm Right lobe: 4.8 x 2.1 x 2.3 cm Left lobe: 5.8 x 1.5 x 1.5 cm _________________________________________________________ Estimated total number of nodules >/= 1 cm: 0 Number of spongiform nodules >/=  2 cm not described  below (TR1): 0 Number of mixed cystic and solid nodules >/= 1.5 cm not described below (TR2): 0 _________________________________________________________ 1.5 x 1.3 x 1.3 cm hypoechoic nodule seen inferior and posterior to the inferior tip of the left thyroid lobe is suspicious for a parathyroid adenoma. IMPRESSION: 1. No significant sonographic abnormality of the thyroid. 2. 1.5 x 1.3 x 1.3 cm hypoechoic mass seen inferior and posterior to the inferior tip of the left thyroid lobe is suspicious for a parathyroid adenoma. The above is in keeping with the ACR TI-RADS recommendations - J Am Coll Radiol 2017;14:587-595. Electronically Signed   By: Miachel Roux M.D.   On: 09/03/2021 14:51    Telemetry    Sinus tachycardia  Sinus rhythm overnight - Personally Reviewed  Cardiac Studies   2D Echocardiogram 12.11.2022  1. Left ventricular ejection fraction, by estimation, is 30 to 35%. The  left ventricle has moderately decreased function. The left ventricle  demonstrates regional wall motion abnormalities (see scoring  diagram/findings for description). The left  ventricular internal cavity size was mildly dilated. There is mild left  ventricular hypertrophy. Left ventricular diastolic parameters are  indeterminate. There is severe hypokinesis of the left ventricular,  mid-apical anteroseptal wall and anterior wall.   There is akinesis of the left ventricular, apical segment. The average  left ventricular  global longitudinal strain is -9.0 %. The global  longitudinal strain is abnormal.   2. Right ventricular systolic function is normal. The right ventricular  size is normal. Tricuspid regurgitation signal is inadequate for assessing  PA pressure.   3. Left atrial size was moderately dilated.   4. The mitral valve is normal in structure. No evidence of mitral valve  regurgitation. No evidence of mitral stenosis.   5. The aortic valve is normal in structure. Aortic valve regurgitation is  not visualized. Aortic valve sclerosis/calcification is present, without  any evidence of aortic stenosis.   Patient Profile     55 y.o. male w/a h/o HTN, DMII, HL, PAD, HL, etoh and tob abuse, who was admitted 12/10 w/ RLE ulceration/osteomyelitis and sepsis as well as Afib RVR and NSTEMI.  Echo w/ EF 30-35%.  Assessment & Plan    1.  Osteomyelitis/Sepsis/Preop Cardiovascular eval: The patient had open amputation yesterday with plans for second stage of below the knee amputation later this week.  He continues to be on antibiotics.  This is a lifesaving procedure and the benefit outweighs the cardiac risk.  2.  NSTEMI:  In setting of #1 and Afib, HsTrop [id number]  [id number]   [id number]  [id number].  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  Currently, pt is unable to express any Ss of c/p or dyspnea.  Cont asa, statin, ? blocker. We will plan on ischemic cardiac evaluation once the patient's overall condition improves.  3.  HFrEF/Presumed ICM:  As above, EF 30-35%.  Hemodynamically stable.  Its not entirely clear if venous saturation of 31 was accurate.  If it is, that indicates significant tissue hypoxia.  However, the patient is not hypotensive and his renal function is normal.  Thus, we will hold off on starting inotropic support.  His blood pressure is relatively low in the setting of presumed sepsis, thus, would not initiate ACE  inhibitor's or ARB's at the present time.  4.  PAF:  Currently maintaining sinus.  CHA2DS2VASc likely 4.  He is likely not a good  candidate for long-term anticoagulation given heavy alcohol use.  He is currently in sinus rhythm.  5.  DMII:  per IM.  Consider SGLT2i but may wish to wait until outpt f/u to ensure appropriate f/u.  6.  Tob/Etoh abuse:  will need counseling and would likely benefit from social work eval.  Signed, Kathlyn Sacramento, MD  09/10/2021, 5:26 PM    For questions or updates, please contact   Please consult [email or web address] for contact info under Cardiology/STEMI.

## 2021-09-10 NOTE — Progress Notes (Signed)
[place], [place] 09/10/21  Subjective:   Hospital day # 3  Patient is currently being monitored in the ICU.  He underwent right below the knee amputation on December 12.  Currently somnolent. Getting IV albumin infusion    Objective:  Vital signs in last 24 hours:  Temp:  [97 F (36.1 C)-101.1 F (38.4 C)] 99.5 F (37.5 C) (12/13 0748) Pulse Rate:  [48-104] 104 (12/13 0748) Resp:  [18-39] 21 (12/13 0748) BP: (88-148)/(61-88) 146/67 (12/13 0748) SpO2:  [89 %-100 %] 94 % (12/13 0748) Weight:  [94.8 kg] 94.8 kg (12/12 1848)  Weight change:  Filed Weights   09/11/2021 0648 09/25/2021 1848  Weight: 93 kg 94.8 kg    Intake/Output:    Intake/Output Summary (Last 24 hours) at 09/10/2021 1109 Last data filed at 09/10/2021 1000 Gross per 24 hour  Intake 1187.51 ml  Output 1450 ml  Net -262.49 ml      Physical Exam: General: No acute distress,ill appearing  HEENT NG tube in place  Pulm/lungs Respirations symmetrical,unlabored, lungs clear  CVS/Heart S1S2, no rubs or gallops  Abdomen:  Soft, nontender  Extremities: Right leg with dressing.  Right BKA.  Neurologic: Somnolent.  Not interactive.          Basic Metabolic Panel:  Recent Labs  Lab 09/22/2021 0658 09/19/2021 0912 09/08/21 0616 09/08/21 1918 09/05/2021 0259 08/29/2021 2315 09/10/21 0402  NA 128*  --  128* 132* 133* 136 137  K 4.0  --  3.2*  --  3.0* 3.5 3.6  CL 87*  --  94*  --  100 104 103  CO2 27  --  29  --  28 31 31   GLUCOSE 317*  --  126*  --  94 122* 138*  BUN 18  --  16  --  16 20 19   CREATININE 0.90  --  0.66  --  0.62 0.87 0.82  CALCIUM 13.6*  --  13.3*  --  13.6* 13.2* 13.0*  MG  --  1.0* 1.6*  --  1.5* 1.8 2.1  PHOS  --   --   --   --  1.6* 2.3* 2.1*      CBC: Recent Labs  Lab 09/05/2021 0658 09/08/21 0616 09/03/2021 0259 09/10/21 0402  WBC 11.2* 12.4* 8.2 7.2  NEUTROABS 10.6*  --   --   --   HGB 14.4 12.6* 11.9* 12.3*  HCT 41.1 36.2* 34.9* 36.5*  MCV  88.6 87.7 89.3 92.2  PLT 185 164 153 151      No results found for: HEPBSAG, HEPBSAB, HEPBIGM    Microbiology:  Recent Results (from the past 240 hour(s))  Urine Culture     Status: None   Collection Time: 09/08/2021  6:48 AM   Specimen: Urine, Random  Result Value Ref Range Status   Specimen Description   Final    URINE, RANDOM Performed at [facility], [address] [place], [place] [address]    Special Requests   Final    NONE Performed at [facility], [address] [place], [place] [address]    Culture   Final    NO GROWTH Performed at [facility], [place] [address], [place],  [address]    Report Status 08/29/2021 FINAL  Final  Culture, blood (routine x 2)     Status: None (Preliminary result)   Collection Time: 09/14/2021  6:58 AM   Specimen: BLOOD  Result Value Ref Range Status  Specimen Description BLOOD LEFT FA  Final   Special Requests   Final    BOTTLES DRAWN AEROBIC AND ANAEROBIC Blood Culture adequate volume   Culture  Setup Time   Final    GRAM POSITIVE RODS ANAEROBIC BOTTLE ONLY CRITICAL RESULT CALLED TO, READ BACK BY AND VERIFIED WITH: NATHAN BLUE@[email or web address]  09/10/21 RH Performed at [facility], [address], [place], [place] [address]    Culture GRAM POSITIVE RODS  Final   Report Status PENDING  Incomplete  Culture, blood (routine x 2)     Status: None (Preliminary result)   Collection Time: 09/04/2021  6:59 AM   Specimen: BLOOD  Result Value Ref Range Status   Specimen Description BLOOD RIGHT FA  Final   Special Requests   Final    BOTTLES DRAWN AEROBIC AND ANAEROBIC Blood Culture adequate volume   Culture   Final    NO GROWTH 3 DAYS Performed at [facility], [address], [place], [place] [address]    Report Status PENDING  Incomplete  Resp Panel by RT-PCR (Flu A&B, Covid) Nasopharyngeal Swab     Status: None   Collection Time: 09/05/2021  7:44 AM   Specimen: Nasopharyngeal Swab;  Nasopharyngeal(NP) swabs in vial transport medium  Result Value Ref Range Status   SARS Coronavirus 2 by RT PCR NEGATIVE NEGATIVE Final    Comment: (NOTE) SARS-CoV-2 target nucleic acids are NOT DETECTED.  The SARS-CoV-2 RNA is generally detectable in upper respiratory specimens during the acute phase of infection. The lowest concentration of SARS-CoV-2 viral copies this assay can detect is 138 copies/mL. A negative result does not preclude SARS-Cov-2 infection and should not be used as the sole basis for treatment or other patient management decisions. A negative result may occur with  improper specimen collection/handling, submission of specimen other than nasopharyngeal swab, presence of viral mutation(s) within the areas targeted by this assay, and inadequate number of viral copies(<138 copies/mL). A negative result must be combined with clinical observations, patient history, and epidemiological information. The expected result is Negative.  Fact Sheet for Patients:  EntrepreneurPulse.com.au  Fact Sheet for Healthcare Providers:  IncredibleEmployment.be  This test is no t yet approved or cleared by the [place] FDA and  has been authorized for detection and/or diagnosis of SARS-CoV-2 by FDA under an Emergency Use Authorization (EUA). This EUA will remain  in effect (meaning this test can be used) for the duration of the COVID-19 declaration under Section 564(b)(1) of the Act, 21 U.S.C.section 360bbb-3(b)(1), unless the authorization is terminated  or revoked sooner.       Influenza A by PCR NEGATIVE NEGATIVE Final   Influenza B by PCR NEGATIVE NEGATIVE Final    Comment: (NOTE) The Xpert Xpress SARS-CoV-2/FLU/RSV plus assay is intended as an aid in the diagnosis of influenza from Nasopharyngeal swab specimens and should not be used as a sole basis for treatment. Nasal washings and aspirates are unacceptable for Xpert Xpress  SARS-CoV-2/FLU/RSV testing.  Fact Sheet for Patients: EntrepreneurPulse.com.au  Fact Sheet for Healthcare Providers: IncredibleEmployment.be  This test is not yet approved or cleared by the [place] FDA and has been authorized for detection and/or diagnosis of SARS-CoV-2 by FDA under an Emergency Use Authorization (EUA). This EUA will remain in effect (meaning this test can be used) for the duration of the COVID-19 declaration under Section 564(b)(1) of the Act, 21 U.S.C. section 360bbb-3(b)(1), unless the authorization is terminated or revoked.  Performed at [facility], [address] [place], [place] [address]   Aerobic/Anaerobic Culture w Gram Stain (surgical/deep wound)     Status: None (Preliminary result)   Collection Time: 09/21/2021  8:16 AM   Specimen: Wound  Result Value Ref Range Status   Specimen Description   Final    WOUND Performed at [facility], [address], [place], [place] [address]    Special Requests RIGHT FOOT  Final   Gram Stain   Final    MODERATE WBC PRESENT, PREDOMINANTLY MONONUCLEAR MODERATE GRAM POSITIVE COCCI IN PAIRS AND CHAINS MODERATE GRAM NEGATIVE RODS Performed at [facility], [place] [address] [place], [place] [address]    Culture   Final    RARE STAPHYLOCOCCUS AUREUS SUSCEPTIBILITIES TO FOLLOW WITHIN MIXED ORGANISMS MIXED ANAEROBIC FLORA PRESENT.  CALL LAB IF FURTHER IID REQUIRED.    Report Status PENDING  Incomplete  MRSA Next Gen by PCR, Nasal     Status: None   Collection Time: 09/21/2021  6:58 PM   Specimen: Nasal Mucosa; Nasal Swab  Result Value Ref Range Status   MRSA by PCR Next Gen NOT DETECTED NOT DETECTED Final    Comment: (NOTE) The GeneXpert MRSA Assay (FDA approved for NASAL specimens only), is one component of a comprehensive MRSA colonization surveillance program. It is not intended to diagnose MRSA infection nor to guide or monitor treatment for  MRSA infections. Test performance is not FDA approved in patients less than 71 years old. Performed at [facility], [address] [place],  [address]     Coagulation Studies: Recent Labs    09/19/2021 0259  LABPROT 18.9*  INR 1.6*     Urinalysis: Recent Labs    09/23/2021 1206  COLORURINE YELLOW*  LABSPEC 1.023  PHURINE 6.0  GLUCOSEU >=500*  HGBUR SMALL*  BILIRUBINUR NEGATIVE  KETONESUR 20*  PROTEINUR NEGATIVE  NITRITE NEGATIVE  LEUKOCYTESUR NEGATIVE       Imaging: DG Abd 1 View  Result Date: 09/10/2021 CLINICAL DATA:  NG placement. EXAM: ABDOMEN - 1 VIEW COMPARISON:  Earlier radiograph dated 09/11/2021. FINDINGS: Interval advancement of the enteric tube with tip and side port in the epigastric area likely in the proximal stomach. The NGT extends to the distal stomach and fold back on itself. No interval change in the bowel gas pattern. IMPRESSION: Enteric tube with tip and side-port in the proximal stomach. Electronically Signed   By: Anner Crete M.D.   On: 09/10/2021 01:11   DG Abd 1 View  Result Date: 09/08/2021 CLINICAL DATA:  OG tube placement EXAM: ABDOMEN - 1 VIEW COMPARISON:  None. FINDINGS: Esophageal tube tip overlies GE junction, side-port overlies distal esophagus, further advancement by at least 10 cm recommended. IMPRESSION: Esophageal tube side-port overlies distal esophagus, further advancement recommended for more optimal positioning These results will be called to the ordering clinician or representative by the Radiologist Assistant, and communication documented in the PACS or [employer]. Electronically Signed   By: Donavan Foil M.D.   On: 09/04/2021 23:57   CT HEAD WO CONTRAST (5MM)  Result Date: 09/08/2021 CLINICAL DATA:  Altered level of consciousness, right foot wound, hypertension, diabetes EXAM: CT HEAD WITHOUT CONTRAST TECHNIQUE: Contiguous axial images were obtained from the base of the skull through the vertex  without intravenous contrast. COMPARISON:  02/06/2019 FINDINGS: Evaluation is extremely limited due to patient motion throughout the exam. Multiple attempts were made at acquiring diagnostic images. Brain: Limited evaluation due to patient motion demonstrates no signs of acute infarct or  hemorrhage. The lateral ventricles and midline structures are unremarkable. No acute extra-axial fluid collections. No mass effect. Vascular: No hyperdense vessel or unexpected calcification. Skull: Normal. Negative for fracture or focal lesion. Sinuses/Orbits: No acute finding. Other: None. IMPRESSION: 1. Limited evaluation due to patient motion. 2. No acute intracranial process. Electronically Signed   By: Randa Ngo M.D.   On: 09/08/2021 17:40   ECHOCARDIOGRAM COMPLETE  Result Date: 09/25/2021    ECHOCARDIOGRAM REPORT   Patient Name:   LYNFORD [place] Date of Exam: 09/15/2021 Medical Rec #:  [id number]   Height:       75.0 in Accession #:    [id number]  Weight:       205.0 lb Date of Birth:  1965/11/29   BSA:          2.218 m Patient Age:    8 years    BP:           97/66 mmHg Patient Gender: M           HR:           94 bpm. Exam Location:  [facility] Procedure: 2D Echo, Color Doppler, Cardiac Doppler and Strain Analysis STAT ECHO Indications:     I21.4 NSTEMI  History:         Patient has no prior history of Echocardiogram examinations.                  Risk Factors:Hypertension and Diabetes.  Sonographer:     Charmayne Sheer Referring Phys:  [id number] [place] Diagnosing Phys: Kathlyn Sacramento MD  Sonographer Comments: No subcostal window. Global longitudinal strain was attempted. IMPRESSIONS  1. Left ventricular ejection fraction, by estimation, is 30 to 35%. The left ventricle has moderately decreased function. The left ventricle demonstrates regional wall motion abnormalities (see scoring diagram/findings for description). The left ventricular internal cavity size was mildly dilated. There is mild left ventricular hypertrophy.  Left ventricular diastolic parameters are indeterminate. There is severe hypokinesis of the left ventricular, mid-apical anteroseptal wall and anterior wall.  There is akinesis of the left ventricular, apical segment. The average left ventricular global longitudinal strain is -9.0 %. The global longitudinal strain is abnormal.  2. Right ventricular systolic function is normal. The right ventricular size is normal. Tricuspid regurgitation signal is inadequate for assessing PA pressure.  3. Left atrial size was moderately dilated.  4. The mitral valve is normal in structure. No evidence of mitral valve regurgitation. No evidence of mitral stenosis.  5. The aortic valve is normal in structure. Aortic valve regurgitation is not visualized. Aortic valve sclerosis/calcification is present, without any evidence of aortic stenosis. FINDINGS  Left Ventricle: Left ventricular ejection fraction, by estimation, is 30 to 35%. The left ventricle has moderately decreased function. The left ventricle demonstrates regional wall motion abnormalities. Severe hypokinesis of the left ventricular, mid-apical anteroseptal wall and anterior wall. The average left ventricular global longitudinal strain is -9.0 %. The global longitudinal strain is abnormal. The left ventricular internal cavity size was mildly dilated. There is mild left ventricular hypertrophy. Left ventricular diastolic parameters are indeterminate. Right Ventricle: The right ventricular size is normal. No increase in right ventricular wall thickness. Right ventricular systolic function is normal. Tricuspid regurgitation signal is inadequate for assessing PA pressure. Left Atrium: Left atrial size was moderately dilated. Right Atrium: Right atrial size was normal in size. Pericardium: There is no evidence of pericardial effusion. Mitral Valve: The mitral valve is normal in structure. No evidence of mitral  valve regurgitation. No evidence of mitral valve stenosis. MV peak  gradient, 3.2 mmHg. The mean mitral valve gradient is 1.0 mmHg. Tricuspid Valve: The tricuspid valve is normal in structure. Tricuspid valve regurgitation is not demonstrated. No evidence of tricuspid stenosis. Aortic Valve: The aortic valve is normal in structure. Aortic valve regurgitation is not visualized. Aortic valve sclerosis/calcification is present, without any evidence of aortic stenosis. Aortic valve mean gradient measures 3.0 mmHg. Aortic valve peak  gradient measures 5.1 mmHg. Aortic valve area, by VTI measures 3.26 cm. Pulmonic Valve: The pulmonic valve was normal in structure. Pulmonic valve regurgitation is mild. No evidence of pulmonic stenosis. Aorta: The aortic root is normal in size and structure. Venous: The inferior vena cava was not well visualized. IAS/Shunts: No atrial level shunt detected by color flow Doppler.  LEFT VENTRICLE PLAX 2D LVIDd:         5.30 cm   Diastology LVIDs:         4.20 cm   LV e' medial:    6.74 cm/s LV PW:         1.30 cm   LV E/e' medial:  14.2 LV IVS:        1.20 cm   LV e' lateral:   7.18 cm/s LVOT diam:     2.30 cm   LV E/e' lateral: 13.4 LV SV:         50 LV SV Index:   22        2D Longitudinal Strain LVOT Area:     4.15 cm  2D Strain GLS Avg:     -9.0 %  RIGHT VENTRICLE RV Basal diam:  3.20 cm RV S prime:     13.60 cm/s LEFT ATRIUM              Index        RIGHT ATRIUM           Index LA diam:        5.00 cm  2.25 cm/m   RA Area:     15.60 cm LA Vol (A2C):   103.0 ml 46.45 ml/m  RA Volume:   31.90 ml  14.39 ml/m LA Vol (A4C):   78.1 ml  35.22 ml/m LA Biplane Vol: 91.9 ml  41.44 ml/m  AORTIC VALVE                    PULMONIC VALVE AV Area (Vmax):    3.24 cm     PV Vmax:       1.05 m/s AV Area (Vmean):   3.21 cm     PV Vmean:      74.200 cm/s AV Area (VTI):     3.26 cm     PV VTI:        0.182 m AV Vmax:           113.00 cm/s  PV Peak grad:  4.4 mmHg AV Vmean:          74.700 cm/s  PV Mean grad:  3.0 mmHg AV VTI:            0.153 m AV Peak Grad:       5.1 mmHg AV Mean Grad:      3.0 mmHg LVOT Vmax:         88.10 cm/s LVOT Vmean:        57.800 cm/s LVOT VTI:          0.120 m LVOT/AV VTI ratio: 0.78  AORTA Ao Root diam: 3.30 cm MITRAL VALVE MV Area (PHT): 4.74 cm    SHUNTS MV Area VTI:   3.00 cm    Systemic VTI:  0.12 m MV Peak grad:  3.2 mmHg    Systemic Diam: 2.30 cm MV Mean grad:  1.0 mmHg MV Vmax:       0.90 m/s MV Vmean:      54.9 cm/s MV Decel Time: 160 msec MV E velocity: 96.00 cm/s MV A velocity: 67.70 cm/s MV E/A ratio:  1.42 Kathlyn Sacramento MD Electronically signed by Kathlyn Sacramento MD Signature Date/Time: 08/29/2021/9:42:39 AM    Final      Medications:    sodium chloride 150 mL/hr at 09/10/21 0936   albumin human 25 g (09/10/21 0939)   piperacillin-tazobactam (ZOSYN)  IV 3.375 g (09/10/21 0508)   thiamine injection 500 mg (09/10/21 0238)   Followed by   Derrill Memo ON 09/04/2021] thiamine injection     vancomycin 1,750 mg (09/10/21 0826)    aspirin EC  81 mg Oral Daily   atorvastatin  40 mg Per Tube QPM   Chlorhexidine Gluconate Cloth  6 each Topical Daily   cinacalcet  30 mg Oral Q supper   diazepam  5 mg Per NG tube Q8H   enoxaparin (LOVENOX) injection  40 mg Subcutaneous Q24H   feeding supplement (GLUCERNA SHAKE)  237 mL Oral QHS   folic acid  1 mg Per Tube Daily   insulin aspart  0-15 Units Subcutaneous Q4H   metoprolol tartrate  25 mg Per Tube BID   multivitamin with minerals  1 tablet Per Tube Daily   pantoprazole (PROTONIX) IV  40 mg Intravenous Q24H   Ensure Max Protein  11 oz Oral BID   [START ON 09/18/2021] thiamine injection  100 mg Intravenous Daily   acetaminophen, acetaminophen, morphine injection, ondansetron **OR** ondansetron (ZOFRAN) IV, oxyCODONE  Assessment/ Plan:  55 y.o. 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 Continue with Sensipar.  Follow-up with endocrinology as outpatient. Agree with forced diuresis with IV fluids and Lasix.  2.  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  #Hypokalemia Lab Results  Component Value Date   K 3.6 09/10/2021  Last potassium in normal range.     LOS: [place] 12/13/202211:09 AM  [place], [place]  Note: This note was prepared with Dragon dictation. Any transcription errors are unintentional

## 2021-09-10 NOTE — Progress Notes (Signed)
Nutrition Follow-up  DOCUMENTATION CODES:   Non-severe (moderate) malnutrition in context of social or environmental circumstances  INTERVENTION:   Osmolite 1.5_0 /hr- Initiate at 62m/hr and increase by 156mhr q 8 hours until goal rate is reached.   Pro-Source 4513mID via tube, provides 40kcal and 11g of protein per serving   Free water flushes 106m85m hours to maintain tube patency   Regimen provides 2600kcal/day, 127g/day protein and 1460ml69m of free water  Pt at high refeed risk; recommend monitor potassium, magnesium and phosphorus labs daily until stable  Juven Fruit Punch BID via tube, each serving provides 95kcal and 2.5g of protein (amino acids glutamine and arginine)  NUTRITION DIAGNOSIS:   Moderate Malnutrition related to social / environmental circumstances (etoh abuse) as evidenced by moderate fat depletion, severe fat depletion, moderate muscle depletion, severe muscle depletion.  GOAL:   Patient will meet greater than or equal to 90% of their needs -not met   MONITOR:   Labs, Weight trends, TF tolerance, Skin, I & O's  ASSESSMENT:   55 y.27 male with medical history significant for hypertension, etoh abuse, diabetes mellitus, dyslipidemia and status post TMA of the right foot which was done on 05/14/21 who is admitted with right foot infection, CHF and NSTEMI.  -Pt s/p R BKA 12/12  Met with pt in room today. Pt unable to provide any nutrition related history r/t AMS. Pt with NGT in place; will plan to initiate tube feeds today. Pt eating 0% of meals prior to NPO. Pt is at high refeed risk. Per chart, pt appears weight stable since admit. Plan is for possible AKA on 12/15.   Medications reviewed and include: aspirin, lovenox, folic acid, insulin, MVI, protonix, thiamine, albumin, zosyn, vancomycin   Labs reviewed: K 3.6 wnl, P 2.1(L), Mg 2.1 wnl Cbgs- 144, 136, 113, 111 x 24 hrs AIC 8.2(H)- 12/10   NUTRITION - FOCUSED PHYSICAL EXAM:  Flowsheet Row  Most Recent Value  Orbital Region Mild depletion  Upper Arm Region Severe depletion  Thoracic and Lumbar Region Moderate depletion  Buccal Region Mild depletion  Temple Region Mild depletion  Clavicle Bone Region Mild depletion  Clavicle and Acromion Bone Region Mild depletion  Scapular Bone Region Moderate depletion  Dorsal Hand Unable to assess  Patellar Region Severe depletion  Anterior Thigh Region Severe depletion  Posterior Calf Region Severe depletion  Edema (RD Assessment) Moderate  Hair Reviewed  Eyes Reviewed  Mouth Reviewed  Skin Reviewed  Nails Reviewed   Diet Order:   Diet Order             Diet NPO time specified  Diet effective midnight                  EDUCATION NEEDS:   No education needs have been identified at this time  Skin:  Skin Assessment: Reviewed RN Assessment (Incision R foot s/p BKA), VAC, ecchymosis   Last BM:  PTA  Height:   Ht Readings from Last 1 Encounters:  09/11/2021 _1  (1.905 m)    Weight:   Wt Readings from Last 1 Encounters:  09/05/2021 94.8 kg    Ideal Body Weight:  83.3 kg (adjusted for rt BKA)  BMI:  Body mass index is 26.12 kg/m.  Estimated Nutritional Needs:   Kcal:  2400-2700kcal/day  Protein:  >120g/day  Fluid:  2.2-2.5L/day  CaseyKoleen DistanceRD, LDN Please refer to AMIONThe [facility] At St [facility] LLCRD and/or RD on-call/weekend/after hours pager

## 2021-09-10 NOTE — Progress Notes (Signed)
PROGRESS NOTE    [name]  WGN:[id number] DOB: 1966-08-01 DOA: 09/08/2021 PCP: Pcp, No    Brief Narrative:  [name] is a 55 y.o. 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 Patient states that postsurgery he followed-up with the podiatrist once but has not gone back since then.  Chart review showed he followed up with the podiatrist on 07/09/2021 and at that time he had a 2 x 2 full-thickness ulcer without signs of infection.  12/11 dressing wet, weeping with fluid and some pus 12/12 pt is s/p guillotine amputation and VAC placed. Plan to go to OR for AKA on Thursday. Tmax 102.5 12/13 was transferred to SDU for closer monitoring yesterday as he was post-op lethargic and hypotensive.. This am confused in mittens. Sister revealed to me today pt is a long standing alcoholic.    Consultants:  Cardiology, vascular surgery, podiatry, pccm, ID  Procedures:  MRI of ankle 1. Bone marrow edema about the base of the fifth metatarsal and cuboid bone, which in the presence of adjacent skin defect is highly suspicious for acute osteomyelitis.  2. Generalized subcutaneous soft tissue edema concerning for cellulitis. No drainable fluid collection or abscess.  3. Fluid tracking along the flexor and peroneal compartments tendon concerning for tenosynovitis    Antimicrobials:  Cefepime, metronidazole, >>>d/c'd Vancomycin zosyn   Subjective: Confused. In mittens.  Objective: Vitals:   09/10/21 0400 09/10/21 0500 09/10/21 0600 09/10/21 0748  BP: 121/84 123/88 126/71 (!) 146/67  Pulse: (!) 102 100 (!) 103 (!) 104  Resp: 20 (!) 31 19 (!) 21  Temp: 98.9 F (37.2 C)   99.5 F (37.5 C)  TempSrc: Axillary   Axillary  SpO2: 99% 91% 96% 94%   Weight:      Height:        Intake/Output Summary (Last 24 hours) at 09/10/2021 0813 Last data filed at 09/10/2021 7846 Gross per 24 hour  Intake 1057.51 ml  Output 850 ml  Net 207.51 ml   Filed Weights   08/31/2021 0648 09/23/2021 1848  Weight: 93 kg 94.8 kg    Examination: Confused, lethargic Decrease bs, no wheezing Reg s1/s2 no gallop Soft benign +bs Rt foot wound vac. No LLE edema Unable to assess cns. In mittens     Data Reviewed: I have personally reviewed following labs and imaging studies  CBC: Recent Labs  Lab 09/22/2021 0658 09/08/21 0616 09/01/2021 0259 09/10/21 0402  WBC 11.2* 12.4* 8.2 7.2  NEUTROABS 10.6*  --   --   --   HGB 14.4 12.6* 11.9* 12.3*  HCT 41.1 36.2* 34.9* 36.5*  MCV 88.6 87.7 89.3 92.2  PLT 185 164 153 962   Basic Metabolic Panel: Recent Labs  Lab 08/29/2021 0658 09/04/2021 0912 09/08/21 0616 09/08/21 1918 09/27/2021 0259 09/06/2021 2315 09/10/21 0402  NA 128*  --  128* 132* 133* 136 137  K 4.0  --  3.2*  --  3.0* 3.5 3.6  CL 87*  --  94*  --  100 104 103  CO2 27  --  29  --  28 31 31   GLUCOSE 317*  --  126*  --  94 122* 138*  BUN 18  --  16  --  16 20 19   CREATININE 0.90  --  0.66  --  0.62 0.87 0.82  CALCIUM 13.6*  --  13.3*  --  13.6* 13.2* 13.0*  MG  --  1.0* 1.6*  --  1.5* 1.8 2.1  PHOS  --   --   --   --  1.6* 2.3* 2.1*   GFR: Estimated Creatinine Clearance: 121.7 mL/min (by C-G formula based on SCr of 0.82 mg/dL). Liver Function Tests: Recent Labs  Lab 09/25/2021 0658 09/10/21 0402  AST 33 39  ALT 23 19  ALKPHOS 94 78  BILITOT 3.1* 1.3*  PROT 6.4* 5.7*  ALBUMIN 2.6* 1.8*   No results for input(s): LIPASE, AMYLASE in the last 168 hours. Recent Labs  Lab 09/10/21 0402  AMMONIA 14   Coagulation Profile: Recent Labs  Lab 08/29/2021 0659 09/16/2021 0259  INR 1.2 1.6*   Cardiac Enzymes: No results for input(s): CKTOTAL, CKMB, CKMBINDEX, TROPONINI in the last 168 hours. BNP (last 3 results) No results for input(s):  PROBNP in the last 8760 hours. HbA1C: No results for input(s): HGBA1C in the last 72 hours.  CBG: Recent Labs  Lab 09/08/2021 1851 09/22/2021 2117 09/10/21 0016 09/10/21 0515 09/10/21 0734  GLUCAP 125* 114* 111* 113* 136*   Lipid Profile: No results for input(s): CHOL, HDL, LDLCALC, TRIG, CHOLHDL, LDLDIRECT in the last 72 hours. Thyroid Function Tests: No results for input(s): TSH, T4TOTAL, FREET4, T3FREE, THYROIDAB in the last 72 hours. Anemia Panel: No results for input(s): VITAMINB12, FOLATE, FERRITIN, TIBC, IRON, RETICCTPCT in the last 72 hours. Sepsis Labs: Recent Labs  Lab 08/29/2021 0658 09/06/2021 0912 09/10/2021 1206 09/21/2021 1444 09/08/21 0616 09/06/2021 1935  PROCALCITON 15.80  --   --   --  8.58  --   LATICACIDVEN 2.9* 2.9* 2.8* 2.8*  --  1.3    Recent Results (from the past 240 hour(s))  Urine Culture     Status: None   Collection Time: 09/19/2021  6:48 AM   Specimen: Urine, Random  Result Value Ref Range Status   Specimen Description   Final    URINE, RANDOM Performed at [facility], [address] [place], [place] [address]    Special Requests   Final    NONE Performed at [facility], [address] [place], [place] [address]    Culture   Final    NO GROWTH Performed at [facility], [place] [address] [place], [place] [address]    Report Status 08/31/2021 FINAL  Final  Culture, blood (routine x 2)     Status: None (Preliminary result)   Collection Time: 09/11/2021  6:58 AM   Specimen: BLOOD  Result Value Ref Range Status   Specimen Description BLOOD LEFT FA  Final   Special Requests   Final    BOTTLES DRAWN AEROBIC AND ANAEROBIC Blood Culture adequate volume   Culture  Setup Time   Final    GRAM POSITIVE RODS ANAEROBIC BOTTLE ONLY CRITICAL RESULT CALLED TO, READ BACK BY AND VERIFIED WITH: NATHAN [email or web address]  09/10/21 RH Performed at [facility], [address] [place], [place] [address]    Culture GRAM POSITIVE RODS   Final   Report Status PENDING  Incomplete  Culture, blood (routine x 2)     Status: None (Preliminary result)   Collection Time: 09/08/2021  6:59 AM   Specimen: BLOOD  Result Value Ref Range Status   Specimen Description BLOOD RIGHT FA  Final   Special Requests   Final    BOTTLES DRAWN AEROBIC AND ANAEROBIC  Blood Culture adequate volume   Culture   Final    NO GROWTH 3 DAYS Performed at [facility], [address] [place], [place] [address]    Report Status PENDING  Incomplete  Resp Panel by RT-PCR (Flu A&B, Covid) Nasopharyngeal Swab     Status: None   Collection Time: 08/31/2021  7:44 AM   Specimen: Nasopharyngeal Swab; Nasopharyngeal(NP) swabs in vial transport medium  Result Value Ref Range Status   SARS Coronavirus 2 by RT PCR NEGATIVE NEGATIVE Final    Comment: (NOTE) SARS-CoV-2 target nucleic acids are NOT DETECTED.  The SARS-CoV-2 RNA is generally detectable in upper respiratory specimens during the acute phase of infection. The lowest concentration of SARS-CoV-2 viral copies this assay can detect is 138 copies/mL. A negative result does not preclude SARS-Cov-2 infection and should not be used as the sole basis for treatment or other patient management decisions. A negative result may occur with  improper specimen collection/handling, submission of specimen other than nasopharyngeal swab, presence of viral mutation(s) within the areas targeted by this assay, and inadequate number of viral copies(<138 copies/mL). A negative result must be combined with clinical observations, patient history, and epidemiological information. The expected result is Negative.  Fact Sheet for Patients:  EntrepreneurPulse.com.au  Fact Sheet for Healthcare Providers:  IncredibleEmployment.be  This test is no t yet approved or cleared by the [place] FDA and  has been authorized for detection and/or diagnosis of SARS-CoV-2 by FDA under an  Emergency Use Authorization (EUA). This EUA will remain  in effect (meaning this test can be used) for the duration of the COVID-19 declaration under Section 564(b)(1) of the Act, 21 U.S.C.section 360bbb-3(b)(1), unless the authorization is terminated  or revoked sooner.       Influenza A by PCR NEGATIVE NEGATIVE Final   Influenza B by PCR NEGATIVE NEGATIVE Final    Comment: (NOTE) The Xpert Xpress SARS-CoV-2/FLU/RSV plus assay is intended as an aid in the diagnosis of influenza from Nasopharyngeal swab specimens and should not be used as a sole basis for treatment. Nasal washings and aspirates are unacceptable for Xpert Xpress SARS-CoV-2/FLU/RSV testing.  Fact Sheet for Patients: EntrepreneurPulse.com.au  Fact Sheet for Healthcare Providers: IncredibleEmployment.be  This test is not yet approved or cleared by the [place] FDA and has been authorized for detection and/or diagnosis of SARS-CoV-2 by FDA under an Emergency Use Authorization (EUA). This EUA will remain in effect (meaning this test can be used) for the duration of the COVID-19 declaration under Section 564(b)(1) of the Act, 21 U.S.C. section 360bbb-3(b)(1), unless the authorization is terminated or revoked.  Performed at [facility], [address], [place], [place] [address]   Aerobic/Anaerobic Culture w Gram Stain (surgical/deep wound)     Status: None (Preliminary result)   Collection Time: 08/31/2021  8:16 AM   Specimen: Wound  Result Value Ref Range Status   Specimen Description   Final    WOUND Performed at [facility], [address] [place], [place] [address]    Special Requests RIGHT FOOT  Final   Gram Stain   Final    MODERATE WBC PRESENT, PREDOMINANTLY MONONUCLEAR MODERATE GRAM POSITIVE COCCI IN PAIRS AND CHAINS MODERATE GRAM NEGATIVE RODS Performed at [facility], [place] [address] [place], [place] [address]    Culture   Final     RARE STAPHYLOCOCCUS AUREUS SUSCEPTIBILITIES TO FOLLOW WITHIN MIXED ORGANISMS MIXED ANAEROBIC FLORA PRESENT.  CALL LAB IF FURTHER IID REQUIRED.  Report Status PENDING  Incomplete  MRSA Next Gen by PCR, Nasal     Status: None   Collection Time: 09/19/2021  6:58 PM   Specimen: Nasal Mucosa; Nasal Swab  Result Value Ref Range Status   MRSA by PCR Next Gen NOT DETECTED NOT DETECTED Final    Comment: (NOTE) The GeneXpert MRSA Assay (FDA approved for NASAL specimens only), is one component of a comprehensive MRSA colonization surveillance program. It is not intended to diagnose MRSA infection nor to guide or monitor treatment for MRSA infections. Test performance is not FDA approved in patients less than 26 years old. Performed at [facility], [address], [place], [place] [address]          Radiology Studies: DG Abd 1 View  Result Date: 09/10/2021 CLINICAL DATA:  NG placement. EXAM: ABDOMEN - 1 VIEW COMPARISON:  Earlier radiograph dated 09/23/2021. FINDINGS: Interval advancement of the enteric tube with tip and side port in the epigastric area likely in the proximal stomach. The NGT extends to the distal stomach and fold back on itself. No interval change in the bowel gas pattern. IMPRESSION: Enteric tube with tip and side-port in the proximal stomach. Electronically Signed   By: Anner Crete M.D.   On: 09/10/2021 01:11   DG Abd 1 View  Result Date: 09/02/2021 CLINICAL DATA:  OG tube placement EXAM: ABDOMEN - 1 VIEW COMPARISON:  None. FINDINGS: Esophageal tube tip overlies GE junction, side-port overlies distal esophagus, further advancement by at least 10 cm recommended. IMPRESSION: Esophageal tube side-port overlies distal esophagus, further advancement recommended for more optimal positioning These results will be called to the ordering clinician or representative by the Radiologist Assistant, and communication documented in the PACS or [employer].  Electronically Signed   By: Donavan Foil M.D.   On: 09/17/2021 23:57   CT HEAD WO CONTRAST (5MM)  Result Date: 09/08/2021 CLINICAL DATA:  Altered level of consciousness, right foot wound, hypertension, diabetes EXAM: CT HEAD WITHOUT CONTRAST TECHNIQUE: Contiguous axial images were obtained from the base of the skull through the vertex without intravenous contrast. COMPARISON:  02/06/2019 FINDINGS: Evaluation is extremely limited due to patient motion throughout the exam. Multiple attempts were made at acquiring diagnostic images. Brain: Limited evaluation due to patient motion demonstrates no signs of acute infarct or hemorrhage. The lateral ventricles and midline structures are unremarkable. No acute extra-axial fluid collections. No mass effect. Vascular: No hyperdense vessel or unexpected calcification. Skull: Normal. Negative for fracture or focal lesion. Sinuses/Orbits: No acute finding. Other: None. IMPRESSION: 1. Limited evaluation due to patient motion. 2. No acute intracranial process. Electronically Signed   By: Randa Ngo M.D.   On: 09/08/2021 17:40   ECHOCARDIOGRAM COMPLETE  Result Date: 09/09/2021    ECHOCARDIOGRAM REPORT   Patient Name:   RAVI TUCCILLO Date of Exam: 09/15/2021 Medical Rec #:  [id number]   Height:       75.0 in Accession #:    [id number]  Weight:       205.0 lb Date of Birth:  01/13/1966   BSA:          2.218 m Patient Age:    65 years    BP:           97/66 mmHg Patient Gender: M           HR:           94 bpm. Exam Location:  [facility] Procedure: 2D Echo, Color Doppler, Cardiac Doppler and  Strain Analysis STAT ECHO Indications:     I21.4 NSTEMI  History:         Patient has no prior history of Echocardiogram examinations.                  Risk Factors:Hypertension and Diabetes.  Sonographer:     Charmayne Sheer Referring Phys:  [id number] [place] Diagnosing Phys: Kathlyn Sacramento MD  Sonographer Comments: No subcostal window. Global longitudinal strain was attempted. IMPRESSIONS  1.  Left ventricular ejection fraction, by estimation, is 30 to 35%. The left ventricle has moderately decreased function. The left ventricle demonstrates regional wall motion abnormalities (see scoring diagram/findings for description). The left ventricular internal cavity size was mildly dilated. There is mild left ventricular hypertrophy. Left ventricular diastolic parameters are indeterminate. There is severe hypokinesis of the left ventricular, mid-apical anteroseptal wall and anterior wall.  There is akinesis of the left ventricular, apical segment. The average left ventricular global longitudinal strain is -9.0 %. The global longitudinal strain is abnormal.  2. Right ventricular systolic function is normal. The right ventricular size is normal. Tricuspid regurgitation signal is inadequate for assessing PA pressure.  3. Left atrial size was moderately dilated.  4. The mitral valve is normal in structure. No evidence of mitral valve regurgitation. No evidence of mitral stenosis.  5. The aortic valve is normal in structure. Aortic valve regurgitation is not visualized. Aortic valve sclerosis/calcification is present, without any evidence of aortic stenosis. FINDINGS  Left Ventricle: Left ventricular ejection fraction, by estimation, is 30 to 35%. The left ventricle has moderately decreased function. The left ventricle demonstrates regional wall motion abnormalities. Severe hypokinesis of the left ventricular, mid-apical anteroseptal wall and anterior wall. The average left ventricular global longitudinal strain is -9.0 %. The global longitudinal strain is abnormal. The left ventricular internal cavity size was mildly dilated. There is mild left ventricular hypertrophy. Left ventricular diastolic parameters are indeterminate. Right Ventricle: The right ventricular size is normal. No increase in right ventricular wall thickness. Right ventricular systolic function is normal. Tricuspid regurgitation signal is  inadequate for assessing PA pressure. Left Atrium: Left atrial size was moderately dilated. Right Atrium: Right atrial size was normal in size. Pericardium: There is no evidence of pericardial effusion. Mitral Valve: The mitral valve is normal in structure. No evidence of mitral valve regurgitation. No evidence of mitral valve stenosis. MV peak gradient, 3.2 mmHg. The mean mitral valve gradient is 1.0 mmHg. Tricuspid Valve: The tricuspid valve is normal in structure. Tricuspid valve regurgitation is not demonstrated. No evidence of tricuspid stenosis. Aortic Valve: The aortic valve is normal in structure. Aortic valve regurgitation is not visualized. Aortic valve sclerosis/calcification is present, without any evidence of aortic stenosis. Aortic valve mean gradient measures 3.0 mmHg. Aortic valve peak  gradient measures 5.1 mmHg. Aortic valve area, by VTI measures 3.26 cm. Pulmonic Valve: The pulmonic valve was normal in structure. Pulmonic valve regurgitation is mild. No evidence of pulmonic stenosis. Aorta: The aortic root is normal in size and structure. Venous: The inferior vena cava was not well visualized. IAS/Shunts: No atrial level shunt detected by color flow Doppler.  LEFT VENTRICLE PLAX 2D LVIDd:         5.30 cm   Diastology LVIDs:         4.20 cm   LV e' medial:    6.74 cm/s LV PW:         1.30 cm   LV E/e' medial:  14.2 LV IVS:  1.20 cm   LV e' lateral:   7.18 cm/s LVOT diam:     2.30 cm   LV E/e' lateral: 13.4 LV SV:         50 LV SV Index:   22        2D Longitudinal Strain LVOT Area:     4.15 cm  2D Strain GLS Avg:     -9.0 %  RIGHT VENTRICLE RV Basal diam:  3.20 cm RV S prime:     13.60 cm/s LEFT ATRIUM              Index        RIGHT ATRIUM           Index LA diam:        5.00 cm  2.25 cm/m   RA Area:     15.60 cm LA Vol (A2C):   103.0 ml 46.45 ml/m  RA Volume:   31.90 ml  14.39 ml/m LA Vol (A4C):   78.1 ml  35.22 ml/m LA Biplane Vol: 91.9 ml  41.44 ml/m  AORTIC VALVE                     PULMONIC VALVE AV Area (Vmax):    3.24 cm     PV Vmax:       1.05 m/s AV Area (Vmean):   3.21 cm     PV Vmean:      74.200 cm/s AV Area (VTI):     3.26 cm     PV VTI:        0.182 m AV Vmax:           113.00 cm/s  PV Peak grad:  4.4 mmHg AV Vmean:          74.700 cm/s  PV Mean grad:  3.0 mmHg AV VTI:            0.153 m AV Peak Grad:      5.1 mmHg AV Mean Grad:      3.0 mmHg LVOT Vmax:         88.10 cm/s LVOT Vmean:        57.800 cm/s LVOT VTI:          0.120 m LVOT/AV VTI ratio: 0.78  AORTA Ao Root diam: 3.30 cm MITRAL VALVE MV Area (PHT): 4.74 cm    SHUNTS MV Area VTI:   3.00 cm    Systemic VTI:  0.12 m MV Peak grad:  3.2 mmHg    Systemic Diam: 2.30 cm MV Mean grad:  1.0 mmHg MV Vmax:       0.90 m/s MV Vmean:      54.9 cm/s MV Decel Time: 160 msec MV E velocity: 96.00 cm/s MV A velocity: 67.70 cm/s MV E/A ratio:  1.42 Kathlyn Sacramento MD Electronically signed by Kathlyn Sacramento MD Signature Date/Time: 09/08/2021/9:42:39 AM    Final         Scheduled Meds:  aspirin EC  81 mg Oral Daily   atorvastatin  40 mg Per Tube QPM   Chlorhexidine Gluconate Cloth  6 each Topical Daily   cinacalcet  30 mg Oral Q supper   enoxaparin (LOVENOX) injection  40 mg Subcutaneous Q24H   feeding supplement (GLUCERNA SHAKE)  237 mL Oral QHS   folic acid  1 mg Per Tube Daily   insulin aspart  0-15 Units Subcutaneous Q4H   LORazepam  0-4 mg Intravenous Q4H   Followed by   [  START ON 09/05/2021] LORazepam  0-4 mg Intravenous Q8H   metoprolol tartrate  25 mg Per Tube BID   multivitamin with minerals  1 tablet Per Tube Daily   pantoprazole (PROTONIX) IV  40 mg Intravenous Q24H   Ensure Max Protein  11 oz Oral BID   [START ON 09/18/2021] thiamine injection  100 mg Intravenous Daily   Continuous Infusions:  sodium chloride 75 mL/hr at 09/24/2021 1452   albumin human     piperacillin-tazobactam (ZOSYN)  IV 3.375 g (09/10/21 0508)   potassium PHOSPHATE IVPB (in mmol) 15 mmol (09/10/21 0239)   thiamine injection 500 mg  (09/10/21 0238)   Followed by   Derrill Memo ON 08/31/2021] thiamine injection     vancomycin 1,750 mg (09/06/2021 2156)    Assessment & Plan:   Principal Problem:   Sepsis ([place]) Active Problems:   Diabetes mellitus with hyperglycemia ([place])   Hypercalcemia   NSTEMI (non-ST elevated myocardial infarction) ([place])   Nicotine dependence   Alcohol dependence ([place])   Diabetic foot ulcer ([place])   Hyponatremia   Patient is a 55 year old male admitted to the hospital for sepsis from an infected diabetic foot ulcer with acute osteomyelitis         Sepsis (POA) due to Rt foot infection  S/p Guillotine amputation  ID following Continue vanco /zosyn Bcx pending Transferred to SDU yesterday for closer observation. No  PCCM following  Diabetic foot ulcer with acute osteomyelitis  Podiatry and vascular surgery's input was appreciated  Foot not salvageable MRI with acute osteomyelitis of the first metatarsal base and cuboid region  s/p guillotine amputation and VAC placed today. Plan to go to OR for AKA on Thursday if stable . 12/13 continue IV antibiotics with vancomycin and Zosyn per ID     NSTEMI TP elevated Echo with EF 30-35%, with regional [facility] is following.-At this point not a good candidate for cardiac cath due to his mental status and active infection  Likely hold off on ischemic w/u since with foot infection/sepsis. Was on heparin gtt. Now on lovenox dvt ppx Continue asa, statin, beta blk     PAF In setting of above stressors 12/13 in SR now.  Continue beta-blockers long-term anticoagulation may be considered in the near future, will defer to cardiology      Acute metabolic encephalopathy 2/2 infection v.s. 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  Plan for surgery .pt very sick, px guarded.  LOS: 3 days   Time spent: 35 minutes with more than 50% on [place], MD [facility] Pager [phone]  If 7PM-7AM, please contact night-coverage 09/10/2021, 8:13 AM

## 2021-09-10 NOTE — Progress Notes (Signed)
Pharmacy Antibiotic Note  [name] is a 54 y.o. male with PMH of HTN, DM, HLD who was admitted on 09/27/2021 for sepsis. Pharmacy has been consulted for vancomycin and zosyn dosing for diabetic foot infection per ID provider.   Pt afebrile >48h, WBC WNL  Wound culture with rare staph aureus within mixed organisms (R to erythromycin and tetracycline), Bcx GPR 1/4, MRSA PCR not detected, Ucx NG  Plan: Vancomycin --Will continue vancomycin 1750mg  Q12H --Obtaining levels to calculate AUC, Vanc peak is 40, trough due 12/13 @ 1930 --Est AUC: 486.6 --Css min: 12.5 --Scr used: 0.82 --IBW used, Vd: 0.72  Zosyn  --Will continue Zosyn 3.375 (extended infusion) q8hr   Height: 6\' 3"  (190.5 cm) Weight: 94.8 kg (208 lb 15.9 oz) IBW/kg (Calculated) : 84.5  Temp (24hrs), Avg:98.8 F (37.1 C), Min:97 F (36.1 C), Max:101.1 F (38.4 C)  Recent Labs  Lab 09/01/2021 0658 09/03/2021 0912 09/21/2021 1206 09/22/2021 1444 09/08/21 0616 09/08/2021 0259 09/24/2021 1935 09/11/2021 2315 09/10/21 0402  WBC 11.2*  --   --   --  12.4* 8.2  --   --  7.2  CREATININE 0.90  --   --   --  0.66 0.62  --  0.87 0.82  LATICACIDVEN 2.9* 2.9* 2.8* 2.8*  --   --  1.3  --   --      Estimated Creatinine Clearance: 121.7 mL/min (by C-G formula based on SCr of 0.82 mg/dL).    No Known Allergies  Antimicrobials this admission: Ongoing Vancomycin 12/10 >> Zosyn 12/13 >>  Completed Cefepime 12/10 >> 12/12  Microbiology results: 12/10 BCx: 12/13 GPR (1 of 4) 12/10 Cx (right foot): rare staph aureus within mixed organisms (R to erythromycin and tetracycline)  Thank you for allowing pharmacy to be a part of this patient's care.  Narda Rutherford, PharmD Pharmacy Resident  09/10/2021 2:44 PM

## 2021-09-10 NOTE — TOC Progression Note (Signed)
Transition of Care [facility]) - Progression Note    Patient Details  Name: [name] MRN: [id number] Date of Birth: Jun 25, 1966  Transition of Care [facility]) CM/SW Contact  Shelbie Hutching, RN Phone Number: 09/10/2021, 1:22 PM  Clinical Narrative:    Patient is currently in the ICU, he went to the OR yesterday for foot amputation will need to go back to the OR for AKA.  Patient has a wound vac to the right stump and ID is following.    Patient's sister is at the bedside today, she is his next of kin.  She reports that her brother is an alcoholic and non compliant at home with his diabetes.  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  Sister will not send him money because she knows that he will just use it to buy alcohol.  Patient does not have insurance, he has been dealing with problems with wounds on his feet for 2 years or so.    TOC will follow and assist with disposition.  Informed sister that we will keep her updated, she lives about 4 hours away in [place].  She and her husband are driving back home this afternoon.     Expected Discharge Plan: [place] Barriers to Discharge: Continued Medical Work up  Expected Discharge Plan and Services Expected Discharge Plan: [place]   Discharge Planning Services: CM Consult   Living arrangements for the past 2 months: Apartment                 DME Arranged: N/A DME Agency: NA       HH Arranged: NA HH Agency: NA         Social Determinants of Health (SDOH) Interventions    Readmission Risk Interventions No flowsheet data found.

## 2021-09-11 ENCOUNTER — Inpatient Hospital Stay: Payer: Self-pay

## 2021-09-11 DIAGNOSIS — G9341 Metabolic encephalopathy: Secondary | ICD-10-CM

## 2021-09-11 DIAGNOSIS — I5020 Unspecified systolic (congestive) heart failure: Secondary | ICD-10-CM

## 2021-09-11 DIAGNOSIS — E440 Moderate protein-calorie malnutrition: Secondary | ICD-10-CM | POA: Insufficient documentation

## 2021-09-11 LAB — GLUCOSE, CAPILLARY
Glucose-Capillary: 160 mg/dL — ABNORMAL HIGH (ref 70–99)
Glucose-Capillary: 179 mg/dL — ABNORMAL HIGH (ref 70–99)
Glucose-Capillary: 207 mg/dL — ABNORMAL HIGH (ref 70–99)
Glucose-Capillary: 233 mg/dL — ABNORMAL HIGH (ref 70–99)
Glucose-Capillary: 256 mg/dL — ABNORMAL HIGH (ref 70–99)
Glucose-Capillary: 256 mg/dL — ABNORMAL HIGH (ref 70–99)
Glucose-Capillary: 283 mg/dL — ABNORMAL HIGH (ref 70–99)

## 2021-09-11 LAB — RENAL FUNCTION PANEL
Albumin: 2.5 g/dL — ABNORMAL LOW (ref 3.5–5.0)
Anion gap: 8 (ref 5–15)
BUN: 24 mg/dL — ABNORMAL HIGH (ref 6–20)
CO2: 27 mmol/L (ref 22–32)
Calcium: 11.2 mg/dL — ABNORMAL HIGH (ref 8.9–10.3)
Chloride: 107 mmol/L (ref 98–111)
Creatinine, Ser: 0.78 mg/dL (ref 0.61–1.24)
GFR, Estimated: >60 mL/min (ref >60)
Glucose, Bld: 292 mg/dL — ABNORMAL HIGH (ref 70–99)
Phosphorus: <1 mg/dL — CL (ref 2.5–4.6)
Potassium: 3.2 mmol/L — ABNORMAL LOW (ref 3.5–5.1)
Sodium: 142 mmol/L (ref 135–145)

## 2021-09-11 LAB — PHOSPHORUS
Phosphorus: <1 mg/dL — CL (ref 2.5–4.6)
Phosphorus: <1 mg/dL — CL (ref 2.5–4.6)

## 2021-09-11 LAB — BASIC METABOLIC PANEL
Anion gap: 3 — ABNORMAL LOW (ref 5–15)
BUN: 22 mg/dL — ABNORMAL HIGH (ref 6–20)
CO2: 30 mmol/L (ref 22–32)
Calcium: 11.2 mg/dL — ABNORMAL HIGH (ref 8.9–10.3)
Chloride: 106 mmol/L (ref 98–111)
Creatinine, Ser: 0.76 mg/dL (ref 0.61–1.24)
GFR, Estimated: >60 mL/min (ref >60)
Glucose, Bld: 210 mg/dL — ABNORMAL HIGH (ref 70–99)
Potassium: 2.8 mmol/L — ABNORMAL LOW (ref 3.5–5.1)
Sodium: 139 mmol/L (ref 135–145)

## 2021-09-11 LAB — MAGNESIUM
Magnesium: 1.8 mg/dL (ref 1.7–2.4)
Magnesium: 1.8 mg/dL (ref 1.7–2.4)

## 2021-09-11 LAB — SURGICAL PATHOLOGY

## 2021-09-11 LAB — ABO/RH: ABO/RH(D): O POS

## 2021-09-11 MED ORDER — POTASSIUM PHOSPHATES 15 MMOLE/5ML IV SOLN
15.0000 mmol | Freq: Once | INTRAVENOUS | Status: AC
  Administered 2021-09-11: 21:00:00 15 mmol via INTRAVENOUS
  Filled 2021-09-11: qty 5

## 2021-09-11 MED ORDER — POTASSIUM PHOSPHATES 15 MMOLE/5ML IV SOLN
45.0000 mmol | Freq: Once | INTRAVENOUS | Status: DC
  Filled 2021-09-11: qty 15

## 2021-09-11 MED ORDER — PHENYLEPHRINE HCL-NACL 20-0.9 MG/250ML-% IV SOLN
25.0000 ug/min | INTRAVENOUS | Status: DC
Start: 2021-09-11 — End: 2021-09-13
  Administered 2021-09-11: 19:00:00 25 ug/min via INTRAVENOUS
  Administered 2021-09-12: 100 ug/min via INTRAVENOUS
  Administered 2021-09-12 (×2): 50 ug/min via INTRAVENOUS
  Administered 2021-09-12: 25 ug/min via INTRAVENOUS
  Administered 2021-09-13: 100 ug/min via INTRAVENOUS
  Administered 2021-09-13: 125 ug/min via INTRAVENOUS
  Administered 2021-09-13: 155 ug/min via INTRAVENOUS
  Administered 2021-09-13 (×2): 110 ug/min via INTRAVENOUS
  Filled 2021-09-11 (×12): qty 250

## 2021-09-11 MED ORDER — ORAL CARE MOUTH RINSE
15.0000 mL | Freq: Two times a day (BID) | OROMUCOSAL | Status: DC
  Administered 2021-09-11 – 2021-09-12 (×3): 15 mL via OROMUCOSAL

## 2021-09-11 MED ORDER — SODIUM CHLORIDE 0.9 % IV SOLN: 250.0000 mL | INTRAVENOUS | Status: DC

## 2021-09-11 MED ORDER — SPIRONOLACTONE 25 MG PO TABS
25.0000 mg | ORAL_TABLET | Freq: Every day | ORAL | Status: DC
  Administered 2021-09-11 – 2021-09-12 (×2): 25 mg
  Filled 2021-09-11 (×2): qty 1

## 2021-09-11 MED ORDER — INSULIN ASPART 100 UNIT/ML IJ SOLN
2.0000 [IU] | INTRAMUSCULAR | Status: DC
Start: 2021-09-11 — End: 2021-09-13
  Administered 2021-09-11 – 2021-09-13 (×10): 2 [IU] via SUBCUTANEOUS
  Filled 2021-09-11 (×10): qty 1

## 2021-09-11 MED ORDER — SODIUM CHLORIDE 0.9% FLUSH
10.0000 mL | Freq: Two times a day (BID) | INTRAVENOUS | Status: DC
  Administered 2021-09-11 – 2021-09-13 (×4): 10 mL

## 2021-09-11 MED ORDER — SODIUM CHLORIDE 0.9% FLUSH: 10.0000 mL | INTRAVENOUS | Status: DC | PRN

## 2021-09-11 MED ORDER — LACTATED RINGERS IV SOLN: INTRAVENOUS | Status: DC

## 2021-09-11 MED ORDER — FUROSEMIDE 10 MG/ML IJ SOLN
40.0000 mg | Freq: Once | INTRAMUSCULAR | Status: AC
  Administered 2021-09-11: 11:00:00 40 mg via INTRAVENOUS
  Filled 2021-09-11: qty 4

## 2021-09-11 MED ORDER — ALBUMIN HUMAN 5 % IV SOLN
25.0000 g | Freq: Once | INTRAVENOUS | Status: AC
  Administered 2021-09-11: 16:00:00 25 g via INTRAVENOUS
  Filled 2021-09-11: qty 500

## 2021-09-11 MED ORDER — POTASSIUM PHOSPHATES 15 MMOLE/5ML IV SOLN
15.0000 mmol | Freq: Once | INTRAVENOUS | Status: AC
  Administered 2021-09-11: 15:00:00 15 mmol via INTRAVENOUS
  Filled 2021-09-11: qty 5

## 2021-09-11 MED ORDER — DEXMEDETOMIDINE HCL IN NACL 400 MCG/100ML IV SOLN
0.4000 ug/kg/h | INTRAVENOUS | Status: DC
  Administered 2021-09-11: 13:00:00 0.6 ug/kg/h via INTRAVENOUS
  Administered 2021-09-11: 20:00:00 1 ug/kg/h via INTRAVENOUS
  Administered 2021-09-12 (×6): 1.2 ug/kg/h via INTRAVENOUS
  Filled 2021-09-11 (×9): qty 100

## 2021-09-11 MED ORDER — FUROSEMIDE 10 MG/ML IJ SOLN: 20.0000 mg | Freq: Two times a day (BID) | INTRAMUSCULAR | Status: DC

## 2021-09-11 MED ORDER — CHLORHEXIDINE GLUCONATE 0.12 % MT SOLN
15.0000 mL | Freq: Two times a day (BID) | OROMUCOSAL | Status: DC
  Administered 2021-09-11 – 2021-09-12 (×3): 15 mL via OROMUCOSAL
  Filled 2021-09-11 (×2): qty 15

## 2021-09-11 NOTE — Progress Notes (Signed)
Date of Admission:  09/04/2021     ID: [name] is a 55 y.o. male Principal Problem:   Sepsis ([place]) Active Problems:   Diabetes mellitus with hyperglycemia ([place])   Hypercalcemia   NSTEMI (non-ST elevated myocardial infarction) ([place])   Nicotine dependence   Alcohol dependence ([place])   Diabetic foot ulcer ([facility])   Hyponatremia   Malnutrition of moderate degree    Subjective: Agitated and obtunded Non verbal  Medications:   aspirin EC  81 mg Oral Daily   atorvastatin  40 mg Per Tube QPM   chlorhexidine  15 mL Mouth Rinse BID   Chlorhexidine Gluconate Cloth  6 each Topical Daily   cinacalcet  30 mg Oral Q supper   diazepam  5 mg Per NG tube Q8H   enoxaparin (LOVENOX) injection  40 mg Subcutaneous Q24H   feeding supplement (PROSource TF)  45 mL Per Tube BID   folic acid  1 mg Per Tube Daily   free water  30 mL Per Tube Q4H   insulin aspart  0-15 Units Subcutaneous Q4H   mouth rinse  15 mL Mouth Rinse q12n4p   metoprolol tartrate  25 mg Per Tube BID   multivitamin with minerals  1 tablet Per Tube Daily   nutrition supplement (JUVEN)  1 packet Per Tube BID BM   pantoprazole (PROTONIX) IV  40 mg Intravenous Q24H   spironolactone  25 mg Per Tube Daily   [START ON 09/18/2021] thiamine injection  100 mg Intravenous Daily    Objective: Vital signs in last 24 hours: Temp:  [97.8 F (36.6 C)-98.1 F (36.7 C)] 98.1 F (36.7 C) (12/14 1131) Pulse Rate:  [85-116] 92 (12/14 1345) Resp:  [18-34] 26 (12/14 1345) BP: (94-141)/(55-94) 105/68 (12/14 1345) SpO2:  [89 %-100 %] 98 % (12/14 1345)  PHYSICAL EXAM:  General: obtunded, agitated pulling lines, Head: Normocephalic, without obvious abnormality, atraumatic. Lungs: Bilateral air entry Heart: Tachycardia abdomen: Soft, non-tender,not distended. Bowel sounds normal. No masses Extremities: Right leg guillotine amputation now has a wound VAC.  Skin: No rashes or lesions. Or bruising Lymph: Cervical, supraclavicular  normal. Neurologic: Grossly non-focal  Lab Results Recent Labs    09/08/2021 0259 09/11/2021 2315 09/10/21 0402 09/11/21 0341  WBC 8.2  --  7.2  --   HGB 11.9*  --  12.3*  --   HCT 34.9*  --  36.5*  --   NA 133*   < > 137 139  K 3.0*   < > 3.6 2.8*  CL 100   < > 103 106  CO2 28   < > 31 30  BUN 16   < > 19 22*  CREATININE 0.62   < > 0.82 0.76   < > = values in this interval not displayed.   Liver Panel Recent Labs    09/10/21 0402  PROT 5.7*  ALBUMIN 1.8*  AST 39  ALT 19  ALKPHOS 78  BILITOT 1.3*   Sedimentation Rate No results for input(s): ESRSEDRATE in the last 72 hours. C-Reactive Protein No results for input(s): CRP in the last 72 hours.  Microbiology:  Studies/Results: DG Abd 1 View  Result Date: 09/10/2021 CLINICAL DATA:  NG placement. EXAM: ABDOMEN - 1 VIEW COMPARISON:  Earlier radiograph dated 09/25/2021. FINDINGS: Interval advancement of the enteric tube with tip and side port in the epigastric area likely in the proximal stomach. The NGT extends to the distal stomach and fold back on itself. No interval change in the  bowel gas pattern. IMPRESSION: Enteric tube with tip and side-port in the proximal stomach. Electronically Signed   By: Anner Crete M.D.   On: 09/10/2021 01:11   DG Abd 1 View  Result Date: 09/02/2021 CLINICAL DATA:  OG tube placement EXAM: ABDOMEN - 1 VIEW COMPARISON:  None. FINDINGS: Esophageal tube tip overlies GE junction, side-port overlies distal esophagus, further advancement by at least 10 cm recommended. IMPRESSION: Esophageal tube side-port overlies distal esophagus, further advancement recommended for more optimal positioning These results will be called to the ordering clinician or representative by the Radiologist Assistant, and communication documented in the PACS or [employer]. Electronically Signed   By: Donavan Foil M.D.   On: 09/02/2021 23:57   MR BRAIN WO CONTRAST  Result Date: 09/10/2021 CLINICAL DATA:  Mental  status change, unknown cause EXAM: MRI HEAD WITHOUT CONTRAST TECHNIQUE: Multiplanar, multiecho pulse sequences of the brain and surrounding structures were obtained without intravenous contrast. COMPARISON:  CT head 09/08/2021. FINDINGS: Severely motion limited study with multiple nondiagnostic sequences. Within this limitation: Brain: No evidence of acute infarct. No hydrocephalus. No definite evidence of acute hemorrhage. No midline shift or mass lesion. No visible extra-axial fluid collection. Vascular: Major arterial flow voids are maintained at the skull base. Skull and upper cervical spine: Normal marrow signal. Sinuses/Orbits: Clear sinuses.  Unremarkable orbits. Other: No mastoid effusions. IMPRESSION: Severely motion limited study with multiple nondiagnostic sequences. No obvious/definite acute abnormality; however, pathology could be obscured. If the patient's mental status improves and is able, repeat exam could provide further evaluation. Electronically Signed   By: Margaretha Sheffield M.D.   On: 09/10/2021 14:08   DG Chest Port 1 View  Result Date: 09/10/2021 CLINICAL DATA:  Shortness of breath EXAM: PORTABLE CHEST 1 VIEW COMPARISON:  Previous study done on 08/31/2021 FINDINGS: Transverse diameter of heart is increased. Central pulmonary vessels are more prominent. There is interval increase in interstitial markings in the parahilar regions and lower lung fields. There are small linear densities in the right parahilar region and medial left lower lung fields suggesting subsegmental atelectasis. There is no new focal pulmonary consolidation. Lateral CP angles are clear. There is no definite pneumothorax. There is a metallic foreign body partly obscuring the left apex. Enteric tube is noted traversing the esophagus with its tip in the stomach. IMPRESSION: Cardiomegaly. Central pulmonary vessels are more prominent suggesting CHF. Increased interstitial markings in both lungs suggest pulmonary edema.  Electronically Signed   By: Elmer Picker M.D.   On: 09/10/2021 12:45     Assessment/Plan: ?Encephalopathy-likely combination of infection/ hypercalcemia.  Need to rule out DTs as according to his girlfriend he drinks a lot of RUM. On thiamine  Sepsis secondary to the rt foot infection S/p Guillotine amputation rt foot No surgical culture sent Patient awaiting AKA Prior culture from the wound has staff aureus and anaerobes. We will discontinue vancomycin.  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

## 2021-09-11 NOTE — Consult Note (Signed)
PHARMACY CONSULT NOTE - FOLLOW UP  Pharmacy Consult for Electrolyte Monitoring and Replacement   Recent Labs: Potassium (mmol/L)  Date Value  09/11/2021 2.8 (L)   Magnesium (mg/dL)  Date Value  09/11/2021 1.8   Calcium (mg/dL)  Date Value  09/11/2021 11.2 (H)   Albumin (g/dL)  Date Value  09/10/2021 1.8 (L)   Phosphorus (mg/dL)  Date Value  09/11/2021 <1.0 (LL)   Sodium (mmol/L)  Date Value  09/11/2021 139     Assessment: 55 year old male with PMH of HTN, DM, and HLD who was admitted to the hospital for right foot pain. Patient subsequently underwent right lower leg amputation.  On 12/14 patient was noted to be hyperkalemic with an undecidable phosphorous and pharmacy was consulted for electrolyte monitoring and replacement.   Patient has received 40mg  IV furosemide 12/13 and 12/14  Labs Na 794>801>655 K 3.5>3.6>2.8 Ca 13.2>13.0>11.2 Phos 1.8>2.1> <1.0 Mg 1.8>2.1>1.8  MIVF: NS 174mL/hr 12/12>12/14  Goal of Therapy:  Electrolytes within normal limits   Plan:  --Order placed for 24mmol [place]  --No other electrolyte replacement is currently indicated  --Will continue to monitor and replace as clinically indicated   Narda Rutherford, PharmD Pharmacy Resident  09/11/2021 1:24 PM

## 2021-09-11 NOTE — Care Management (Signed)
Patient with severe Dts, started on precedex gtt.  Discussed with PCCM attending, patient now hypotensive requiring pressor inititation. PCCM to assume primary care of this patient. Appreciate assistance  [facility] hospitalist service to sign off at this time. Please contact our service when patient stable for transfer to gen medical floor  Ralene Muskrat MD

## 2021-09-11 NOTE — Progress Notes (Signed)
[place], [place] 09/11/21  Subjective:   Hospital day # 4  Patient is currently being monitored in the ICU.  He underwent right below the knee amputation on December 12.  Currently somnolent. Now has NG tube draining at 50 cc/h Responds to pain Getting IV antibiotics Was noted to have critically low phosphorus this morning.  Given IV replacement by primary team.   Objective:  Vital signs in last 24 hours:  Temp:  [97.8 F (36.6 C)-98.1 F (36.7 C)] 97.8 F (36.6 C) (12/14 0800) Pulse Rate:  [85-108] 98 (12/14 0902) Resp:  [18-34] 22 (12/14 0902) BP: (94-141)/(55-94) 133/83 (12/14 0902) SpO2:  [92 %-100 %] 95 % (12/14 0902)  Weight change:  Filed Weights   09/05/2021 0648 09/17/2021 1848  Weight: 93 kg 94.8 kg    Intake/Output:    Intake/Output Summary (Last 24 hours) at 09/11/2021 1053 Last data filed at 09/11/2021 5638 Gross per 24 hour  Intake 5650.03 ml  Output 2200 ml  Net 3450.03 ml      Physical Exam: General: Critically ill-appearing  HEENT NG tube in place  Pulm/lungs Respirations symmetrical,unlabored, lungs clear  CVS/Heart S1S2, no rubs or gallops  Abdomen:  Soft, nontender  Extremities: Right leg with dressing.  Right BKA.  Neurologic: Somnolent.  Not interactive.          Basic Metabolic Panel:  Recent Labs  Lab 09/08/21 0616 09/08/21 1918 09/03/2021 0259 09/19/2021 2315 09/10/21 0402 09/11/21 0341  NA 128* 132* 133* 136 137 139  K 3.2*  --  3.0* 3.5 3.6 2.8*  CL 94*  --  100 104 103 106  CO2 29  --  28 31 31 30   GLUCOSE 126*  --  94 122* 138* 210*  BUN 16  --  16 20 19  22*  CREATININE 0.66  --  0.62 0.87 0.82 0.76  CALCIUM 13.3*  --  13.6* 13.2* 13.0* 11.2*  MG 1.6*  --  1.5* 1.8 2.1 1.8  PHOS  --   --  1.6* 2.3* 2.1* <1.0*      CBC: Recent Labs  Lab 08/30/2021 0658 09/08/21 0616 09/21/2021 0259 09/10/21 0402  WBC 11.2* 12.4* 8.2 7.2  NEUTROABS 10.6*  --   --   --   HGB 14.4 12.6* 11.9* 12.3*   HCT 41.1 36.2* 34.9* 36.5*  MCV 88.6 87.7 89.3 92.2  PLT 185 164 153 151      No results found for: HEPBSAG, HEPBSAB, HEPBIGM    Microbiology:  Recent Results (from the past 240 hour(s))  Urine Culture     Status: None   Collection Time: 09/20/2021  6:48 AM   Specimen: Urine, Random  Result Value Ref Range Status   Specimen Description   Final    URINE, RANDOM Performed at [facility], [address], [place], [place] [address]    Special Requests   Final    NONE Performed at [facility], [address], [place], [place] [address]    Culture   Final    NO GROWTH Performed at [facility], [place] [address], [place], [place] [address]    Report Status 09/10/2021 FINAL  Final  Culture, blood (routine x 2)     Status: None (Preliminary result)   Collection Time: 09/15/2021  6:58 AM   Specimen: BLOOD  Result Value Ref Range Status   Specimen Description BLOOD LEFT FA  Final   Special Requests   Final  BOTTLES DRAWN AEROBIC AND ANAEROBIC Blood Culture adequate volume   Culture  Setup Time   Final    GRAM POSITIVE RODS ANAEROBIC BOTTLE ONLY CRITICAL RESULT CALLED TO, READ BACK BY AND VERIFIED WITH: NATHAN BLUE@[email or web address]  09/10/21 RH Performed at [facility], [address] [place], [place] [address]    Culture GRAM POSITIVE RODS  Final   Report Status PENDING  Incomplete  Culture, blood (routine x 2)     Status: None (Preliminary result)   Collection Time: 09/24/2021  6:59 AM   Specimen: BLOOD  Result Value Ref Range Status   Specimen Description BLOOD RIGHT FA  Final   Special Requests   Final    BOTTLES DRAWN AEROBIC AND ANAEROBIC Blood Culture adequate volume   Culture   Final    NO GROWTH 4 DAYS Performed at [facility], [address], [place], [place] [address]    Report Status PENDING  Incomplete  Resp Panel by RT-PCR (Flu A&B, Covid) Nasopharyngeal Swab     Status: None   Collection Time: 09/11/2021  7:44 AM    Specimen: Nasopharyngeal Swab; Nasopharyngeal(NP) swabs in vial transport medium  Result Value Ref Range Status   SARS Coronavirus 2 by RT PCR NEGATIVE NEGATIVE Final    Comment: (NOTE) SARS-CoV-2 target nucleic acids are NOT DETECTED.  The SARS-CoV-2 RNA is generally detectable in upper respiratory specimens during the acute phase of infection. The lowest concentration of SARS-CoV-2 viral copies this assay can detect is 138 copies/mL. A negative result does not preclude SARS-Cov-2 infection and should not be used as the sole basis for treatment or other patient management decisions. A negative result may occur with  improper specimen collection/handling, submission of specimen other than nasopharyngeal swab, presence of viral mutation(s) within the areas targeted by this assay, and inadequate number of viral copies(<138 copies/mL). A negative result must be combined with clinical observations, patient history, and epidemiological information. The expected result is Negative.  Fact Sheet for Patients:  EntrepreneurPulse.com.au  Fact Sheet for Healthcare Providers:  IncredibleEmployment.be  This test is no t yet approved or cleared by the [place] FDA and  has been authorized for detection and/or diagnosis of SARS-CoV-2 by FDA under an Emergency Use Authorization (EUA). This EUA will remain  in effect (meaning this test can be used) for the duration of the COVID-19 declaration under Section 564(b)(1) of the Act, 21 U.S.C.section 360bbb-3(b)(1), unless the authorization is terminated  or revoked sooner.       Influenza A by PCR NEGATIVE NEGATIVE Final   Influenza B by PCR NEGATIVE NEGATIVE Final    Comment: (NOTE) The Xpert Xpress SARS-CoV-2/FLU/RSV plus assay is intended as an aid in the diagnosis of influenza from Nasopharyngeal swab specimens and should not be used as a sole basis for treatment. Nasal washings and aspirates are  unacceptable for Xpert Xpress SARS-CoV-2/FLU/RSV testing.  Fact Sheet for Patients: EntrepreneurPulse.com.au  Fact Sheet for Healthcare Providers: IncredibleEmployment.be  This test is not yet approved or cleared by the [place] FDA and has been authorized for detection and/or diagnosis of SARS-CoV-2 by FDA under an Emergency Use Authorization (EUA). This EUA will remain in effect (meaning this test can be used) for the duration of the COVID-19 declaration under Section 564(b)(1) of the Act, 21 U.S.C. section 360bbb-3(b)(1), unless the authorization is terminated or revoked.  Performed at [facility], [address], [place], [place] [address]   Aerobic/Anaerobic Culture w Gram Stain (surgical/deep wound)  Status: None   Collection Time: 09/20/2021  8:16 AM   Specimen: Wound  Result Value Ref Range Status   Specimen Description   Final    WOUND Performed at [facility], [address] [place], [place] [address]    Special Requests RIGHT FOOT  Final   Gram Stain   Final    MODERATE WBC PRESENT, PREDOMINANTLY MONONUCLEAR MODERATE GRAM POSITIVE COCCI IN PAIRS AND CHAINS MODERATE GRAM NEGATIVE RODS Performed at [facility], [place] [address], [place], [place] [address]    Culture   Final    RARE STAPHYLOCOCCUS AUREUS WITHIN MIXED ORGANISMS MIXED ANAEROBIC FLORA PRESENT.  CALL LAB IF FURTHER IID REQUIRED.    Report Status 09/10/2021 FINAL  Final   Organism ID, Bacteria STAPHYLOCOCCUS AUREUS  Final      Susceptibility   Staphylococcus aureus - MIC*    CIPROFLOXACIN <=0.5 SENSITIVE Sensitive     ERYTHROMYCIN >=8 RESISTANT Resistant     GENTAMICIN <=0.5 SENSITIVE Sensitive     OXACILLIN 0.5 SENSITIVE Sensitive     TETRACYCLINE >=16 RESISTANT Resistant     VANCOMYCIN <=0.5 SENSITIVE Sensitive     TRIMETH/SULFA <=10 SENSITIVE Sensitive     CLINDAMYCIN <=0.25 SENSITIVE Sensitive     RIFAMPIN <=0.5 SENSITIVE  Sensitive     Inducible Clindamycin NEGATIVE Sensitive     * RARE STAPHYLOCOCCUS AUREUS  MRSA Next Gen by PCR, Nasal     Status: None   Collection Time: 09/20/2021  6:58 PM   Specimen: Nasal Mucosa; Nasal Swab  Result Value Ref Range Status   MRSA by PCR Next Gen NOT DETECTED NOT DETECTED Final    Comment: (NOTE) The GeneXpert MRSA Assay (FDA approved for NASAL specimens only), is one component of a comprehensive MRSA colonization surveillance program. It is not intended to diagnose MRSA infection nor to guide or monitor treatment for MRSA infections. Test performance is not FDA approved in patients less than 67 years old. Performed at [facility], [address] [place], [place] [address]     Coagulation Studies: Recent Labs    09/06/2021 0259  LABPROT 18.9*  INR 1.6*     Urinalysis: No results for input(s): COLORURINE, LABSPEC, PHURINE, GLUCOSEU, HGBUR, BILIRUBINUR, KETONESUR, PROTEINUR, UROBILINOGEN, NITRITE, LEUKOCYTESUR in the last 72 hours.  Invalid input(s): APPERANCEUR     Imaging: DG Abd 1 View  Result Date: 09/10/2021 CLINICAL DATA:  NG placement. EXAM: ABDOMEN - 1 VIEW COMPARISON:  Earlier radiograph dated 08/31/2021. FINDINGS: Interval advancement of the enteric tube with tip and side port in the epigastric area likely in the proximal stomach. The NGT extends to the distal stomach and fold back on itself. No interval change in the bowel gas pattern. IMPRESSION: Enteric tube with tip and side-port in the proximal stomach. Electronically Signed   By: Anner Crete M.D.   On: 09/10/2021 01:11   DG Abd 1 View  Result Date: 09/14/2021 CLINICAL DATA:  OG tube placement EXAM: ABDOMEN - 1 VIEW COMPARISON:  None. FINDINGS: Esophageal tube tip overlies GE junction, side-port overlies distal esophagus, further advancement by at least 10 cm recommended. IMPRESSION: Esophageal tube side-port overlies distal esophagus, further advancement recommended for more  optimal positioning These results will be called to the ordering clinician or representative by the Radiologist Assistant, and communication documented in the PACS or [employer]. Electronically Signed   By: Donavan Foil M.D.   On: 09/22/2021 23:57   MR BRAIN WO CONTRAST  Result Date: 09/10/2021 CLINICAL  DATA:  Mental status change, unknown cause EXAM: MRI HEAD WITHOUT CONTRAST TECHNIQUE: Multiplanar, multiecho pulse sequences of the brain and surrounding structures were obtained without intravenous contrast. COMPARISON:  CT head 09/08/2021. FINDINGS: Severely motion limited study with multiple nondiagnostic sequences. Within this limitation: Brain: No evidence of acute infarct. No hydrocephalus. No definite evidence of acute hemorrhage. No midline shift or mass lesion. No visible extra-axial fluid collection. Vascular: Major arterial flow voids are maintained at the skull base. Skull and upper cervical spine: Normal marrow signal. Sinuses/Orbits: Clear sinuses.  Unremarkable orbits. Other: No mastoid effusions. IMPRESSION: Severely motion limited study with multiple nondiagnostic sequences. No obvious/definite acute abnormality; however, pathology could be obscured. If the patient's mental status improves and is able, repeat exam could provide further evaluation. Electronically Signed   By: Margaretha Sheffield M.D.   On: 09/10/2021 14:08   DG Chest Port 1 View  Result Date: 09/10/2021 CLINICAL DATA:  Shortness of breath EXAM: PORTABLE CHEST 1 VIEW COMPARISON:  Previous study done on 09/11/2021 FINDINGS: Transverse diameter of heart is increased. Central pulmonary vessels are more prominent. There is interval increase in interstitial markings in the parahilar regions and lower lung fields. There are small linear densities in the right parahilar region and medial left lower lung fields suggesting subsegmental atelectasis. There is no new focal pulmonary consolidation. Lateral CP angles are clear. There  is no definite pneumothorax. There is a metallic foreign body partly obscuring the left apex. Enteric tube is noted traversing the esophagus with its tip in the stomach. IMPRESSION: Cardiomegaly. Central pulmonary vessels are more prominent suggesting CHF. Increased interstitial markings in both lungs suggest pulmonary edema. Electronically Signed   By: Elmer Picker M.D.   On: 09/10/2021 12:45     Medications:    feeding supplement (OSMOLITE 1.5 CAL) 1,000 mL (09/10/21 1602)   lactated ringers 75 mL/hr at 09/11/21 0851   piperacillin-tazobactam (ZOSYN)  IV 12.5 mL/hr at 09/11/21 0825   thiamine injection 500 mg (09/11/21 0909)   Followed by   Derrill Memo ON 09/19/2021] thiamine injection     vancomycin 1,750 mg (09/11/21 0840)    aspirin EC  81 mg Oral Daily   atorvastatin  40 mg Per Tube QPM   Chlorhexidine Gluconate Cloth  6 each Topical Daily   cinacalcet  30 mg Oral Q supper   diazepam  5 mg Per NG tube Q8H   enoxaparin (LOVENOX) injection  40 mg Subcutaneous Q24H   feeding supplement (PROSource TF)  45 mL Per Tube BID   folic acid  1 mg Per Tube Daily   free water  30 mL Per Tube Q4H   furosemide  40 mg Intravenous Once   insulin aspart  0-15 Units Subcutaneous Q4H   metoprolol tartrate  25 mg Per Tube BID   multivitamin with minerals  1 tablet Per Tube Daily   nutrition supplement (JUVEN)  1 packet Per Tube BID BM   pantoprazole (PROTONIX) IV  40 mg Intravenous Q24H   [START ON 09/18/2021] thiamine injection  100 mg Intravenous Daily   acetaminophen, acetaminophen, morphine injection, ondansetron **OR** ondansetron (ZOFRAN) IV, oxyCODONE  Assessment/ Plan:  55 y.o. 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 Lab Results  Component Value Date   PTH 79 (H) 09/10/2021   CALCIUM 11.2 (H) 09/11/2021   PHOS <1.0 (LL) 09/11/2021    Elevated PTH CXR - neg for findings of sarcoid Thyroid studies normal in 06/2020 SPEP neg for myeloma in 06/2020 Primary Hyperparathyroidism, Neck ultrasound shows 1.5 cm left parathyroid adenoma. Continue with Sensipar.  Follow-up with endocrinology as outpatient. Agree with forced diuresis with IV fluids and Lasix.  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  #Hypokalemia Lab Results  Component Value Date   K 2.8 (L) 09/11/2021  IV fluids changed to lactated Ringer's. Will add small dose of spironolactone. Check magnesium and replace as necessary  #Hypophosphatemia Should improve with tube feeds Agree with IV replacement Repeat check this afternoon.     LOS: [place] 12/14/202210:53 AM  [place], [place]  Note: This note was prepared with Dragon dictation. Any transcription errors are unintentional

## 2021-09-11 NOTE — Progress Notes (Signed)
Progress Note Patient Name: [name] Date of Encounter: 09/11/2021  [facility] Cardiologist: Ida Rogue, MD   Subjective   Patient remains agitated in the ICU, has mittens on, does not follow commands.  Inpatient Medications    Scheduled Meds:  aspirin EC  81 mg Oral Daily   atorvastatin  40 mg Per Tube QPM   chlorhexidine  15 mL Mouth Rinse BID   Chlorhexidine Gluconate Cloth  6 each Topical Daily   cinacalcet  30 mg Oral Q supper   diazepam  5 mg Per NG tube Q8H   enoxaparin (LOVENOX) injection  40 mg Subcutaneous Q24H   feeding supplement (PROSource TF)  45 mL Per Tube BID   folic acid  1 mg Per Tube Daily   free water  30 mL Per Tube Q4H   insulin aspart  0-15 Units Subcutaneous Q4H   insulin aspart  2 Units Subcutaneous Q4H   mouth rinse  15 mL Mouth Rinse q12n4p   metoprolol tartrate  25 mg Per Tube BID   multivitamin with minerals  1 tablet Per Tube Daily   nutrition supplement (JUVEN)  1 packet Per Tube BID BM   pantoprazole (PROTONIX) IV  40 mg Intravenous Q24H   sodium chloride flush  10-40 mL Intracatheter Q12H   spironolactone  25 mg Per Tube Daily   [START ON 09/18/2021] thiamine injection  100 mg Intravenous Daily   Continuous Infusions:  sodium chloride     dexmedetomidine (PRECEDEX) IV infusion 0.6 mcg/kg/hr (09/11/21 1236)   feeding supplement (OSMOLITE 1.5 CAL) 1,000 mL (09/10/21 1602)   lactated ringers 75 mL/hr at 09/11/21 1200   phenylephrine (NEO-SYNEPHRINE) Adult infusion     piperacillin-tazobactam (ZOSYN)  IV 3.375 g (09/11/21 1459)   potassium PHOSPHATE IVPB (in mmol) 15 mmol (09/11/21 1505)   thiamine injection Stopped (09/11/21 0939)   Followed by   Derrill Memo ON 09/06/2021] thiamine injection     PRN Meds: acetaminophen, acetaminophen, morphine injection, ondansetron **OR** ondansetron (ZOFRAN) IV, oxyCODONE, sodium chloride flush   Vital Signs    Vitals:   09/11/21 1615 09/11/21 1630 09/11/21 1645 09/11/21 1700  BP: 99/71   114/70 121/72  Pulse: 97 (!) 101  (!) 105  Resp: (!) 23 (!) 31 (!) 26 (!) 29  Temp:      TempSrc:      SpO2: 100% 100%  100%  Weight:      Height:        Intake/Output Summary (Last 24 hours) at 09/11/2021 1816 Last data filed at 09/11/2021 1236 Gross per 24 hour  Intake 6501.02 ml  Output 2150 ml  Net 4351.02 ml   Last 3 Weights 08/31/2021 09/02/2021 06/13/2021  Weight (lbs) 208 lb 15.9 oz 205 lb 206 lb  Weight (kg) 94.8 kg 92.987 kg 93.441 kg      Telemetry    Sinus tachycardia- Personally Reviewed  ECG     - Personally Reviewed  Physical Exam   GEN: Appears agitated Neck: No JVD Cardiac: RRR, no murmurs, rubs, or gallops.  Respiratory: Clear to auscultation bilaterally. GI: Soft, nontender, non-distended  MS: Right foot wrapped, left foot appears cool, left ankle is warm Neuro: Unable to assess Psych: Unable to assess  Labs    High Sensitivity Troponin:   Recent Labs  Lab 09/16/2021 0658 09/17/2021 0912 09/24/2021 1206 09/08/2021 [place]  Lab 09/21/2021 7939 08/30/2021 0912 09/10/21 0402 09/11/21 0341 09/11/21 1144  NA 128*   < > 137 139 142  K 4.0   < > 3.6 2.8* 3.2*  CL 87*   < > 103 106 107  CO2 27   < > 31 30 27   GLUCOSE 317*   < > 138* 210* 292*  BUN 18   < > 19 22* 24*  CREATININE 0.90   < > 0.82 0.76 0.78  CALCIUM 13.6*   < > 13.0* 11.2* 11.2*  MG  --    < > 2.1 1.8 1.8  PROT 6.4*  --  5.7*  --   --   ALBUMIN 2.6*  --  1.8*  --  2.5*  AST 33  --  39  --   --   ALT 23  --  19  --   --   ALKPHOS 94  --  78  --   --   BILITOT 3.1*  --  1.3*  --   --   GFRNONAA >60   < > >60 >60 >60  ANIONGAP 14   < > 3* 3* 8   < > = values in this interval not displayed.    Lipids No results for input(s): CHOL, TRIG, HDL, LABVLDL, LDLCALC, CHOLHDL in the last 168 hours.  Hematology Recent Labs  Lab 09/08/21 0616 09/06/2021 0259 09/10/21 0402  WBC 12.4* 8.2 7.2  RBC 4.13* 3.91* 3.96*  HGB  12.6* 11.9* 12.3*  HCT 36.2* 34.9* 36.5*  MCV 87.7 89.3 92.2  MCH 30.5 30.4 31.1  MCHC 34.8 34.1 33.7  RDW 11.9 12.1 12.2  PLT 164 153 151   Thyroid No results for input(s): TSH, FREET4 in the last 168 hours.  BNP Recent Labs  Lab 09/10/21 0402  BNP 600.5*    DDimer No results for input(s): DDIMER in the last 168 hours.   Radiology    DG Abd 1 View  Result Date: 09/10/2021 CLINICAL DATA:  NG placement. EXAM: ABDOMEN - 1 VIEW COMPARISON:  Earlier radiograph dated 09/22/2021. FINDINGS: Interval advancement of the enteric tube with tip and side port in the epigastric area likely in the proximal stomach. The NGT extends to the distal stomach and fold back on itself. No interval change in the bowel gas pattern. IMPRESSION: Enteric tube with tip and side-port in the proximal stomach. Electronically Signed   By: Anner Crete M.D.   On: 09/10/2021 01:11   DG Abd 1 View  Result Date: 09/28/2021 CLINICAL DATA:  OG tube placement EXAM: ABDOMEN - 1 VIEW COMPARISON:  None. FINDINGS: Esophageal tube tip overlies GE junction, side-port overlies distal esophagus, further advancement by at least 10 cm recommended. IMPRESSION: Esophageal tube side-port overlies distal esophagus, further advancement recommended for more optimal positioning These results will be called to the ordering clinician or representative by the Radiologist Assistant, and communication documented in the PACS or [employer]. Electronically Signed   By: Donavan Foil M.D.   On: 09/18/2021 23:57   MR BRAIN WO CONTRAST  Result Date: 09/10/2021 CLINICAL DATA:  Mental status change, unknown cause EXAM: MRI HEAD WITHOUT CONTRAST TECHNIQUE: Multiplanar, multiecho pulse sequences of the brain and surrounding structures were obtained without intravenous contrast. COMPARISON:  CT head 09/08/2021. FINDINGS: Severely motion limited study with multiple nondiagnostic sequences. Within this limitation: Brain: No evidence of acute  infarct. No hydrocephalus. No definite evidence of acute hemorrhage. No midline shift or mass lesion. No visible extra-axial fluid collection. Vascular: Major arterial flow voids are maintained at the skull base. Skull and  upper cervical spine: Normal marrow signal. Sinuses/Orbits: Clear sinuses.  Unremarkable orbits. Other: No mastoid effusions. IMPRESSION: Severely motion limited study with multiple nondiagnostic sequences. No obvious/definite acute abnormality; however, pathology could be obscured. If the patient's mental status improves and is able, repeat exam could provide further evaluation. Electronically Signed   By: Margaretha Sheffield M.D.   On: 09/10/2021 14:08   DG Chest Port 1 View  Result Date: 09/10/2021 CLINICAL DATA:  Shortness of breath EXAM: PORTABLE CHEST 1 VIEW COMPARISON:  Previous study done on 09/06/2021 FINDINGS: Transverse diameter of heart is increased. Central pulmonary vessels are more prominent. There is interval increase in interstitial markings in the parahilar regions and lower lung fields. There are small linear densities in the right parahilar region and medial left lower lung fields suggesting subsegmental atelectasis. There is no new focal pulmonary consolidation. Lateral CP angles are clear. There is no definite pneumothorax. There is a metallic foreign body partly obscuring the left apex. Enteric tube is noted traversing the esophagus with its tip in the stomach. IMPRESSION: Cardiomegaly. Central pulmonary vessels are more prominent suggesting CHF. Increased interstitial markings in both lungs suggest pulmonary edema. Electronically Signed   By: Elmer Picker M.D.   On: 09/10/2021 12:45   [place] EKG SITE RITE  Result Date: 09/11/2021 If Site Rite image not attached, placement could not be confirmed due to current cardiac rhythm.   Cardiac Studies   Echo 09/10/2021  1. Left ventricular ejection fraction, by estimation, is 30 to 35%. The  left ventricle has  moderately decreased function. The left ventricle  demonstrates regional wall motion abnormalities (see scoring  diagram/findings for description). The left  ventricular internal cavity size was mildly dilated. There is mild left  ventricular hypertrophy. Left ventricular diastolic parameters are  indeterminate. There is severe hypokinesis of the left ventricular,  mid-apical anteroseptal wall and anterior wall.   There is akinesis of the left ventricular, apical segment. The average  left ventricular global longitudinal strain is -9.0 %. The global  longitudinal strain is abnormal.   2. Right ventricular systolic function is normal. The right ventricular  size is normal. Tricuspid regurgitation signal is inadequate for assessing  PA pressure.   3. Left atrial size was moderately dilated.   4. The mitral valve is normal in structure. No evidence of mitral valve  regurgitation. No evidence of mitral stenosis.   5. The aortic valve is normal in structure. Aortic valve regurgitation is  not visualized. Aortic valve sclerosis/calcification is present, without  any evidence of aortic stenosis.   Patient Profile     55 y.o. male with history of hypertension, diabetes, alcohol and tobacco use presenting with right foot wound, diagnosed with right foot osteomyelitis s/p resection.  Being seen for CHF and NSTEMI  Assessment & Plan    HFrEF EF 30 to 35% -Likely ischemic -Low blood pressures preventing addition of GDMT for CHF at this time. -Agitated, not candidate for ischemic eval at this point.  2.  Paroxysmal A. fib in light of sepsis/osteomyelitis -Currently in sinus rhythm -Not a good candidate for long-term anticoagulation unless neurologic function recovers and clinical status improves  3.  NSTEMI -Aspirin, beta-blocker if tolerable -Agitated, altered -Ischemic eval when clinic function improves  4.  Osteomyelitis s/p resection -Antibiotics, -Generally amputation being  considered -Management as per vascular surgery  5.  EtOH use, tobacco use -Management as per ICU team.  Monitor for withdrawals.  Total encounter time 35 minutes  Greater than 50% was  spent in counseling and coordination of care with the patient     Signed, Kate Sable, MD  09/11/2021, 6:16 PM

## 2021-09-11 NOTE — Progress Notes (Signed)
Peripherally Inserted Central Catheter Placement  The IV Nurse has discussed with the patient and/or persons authorized to consent for the patient, the purpose of this procedure and the potential benefits and risks involved with this procedure.  The benefits include less needle sticks, lab draws from the catheter, and the patient may be discharged home with the catheter. Risks include, but not limited to, infection, bleeding, blood clot (thrombus formation), and puncture of an artery; nerve damage and irregular heartbeat and possibility to perform a PICC exchange if needed/ordered by physician.  Alternatives to this procedure were also discussed.  Bard Power PICC patient education guide, fact sheet on infection prevention and patient information card has been provided to patient /or left at bedside.   Consent obtained via telephone with sister  PICC Placement Documentation  PICC Triple Lumen 83/72/90 PICC Right Basilic 42 cm 2 cm (Active)  Indication for Insertion or Continuance of Line Vasoactive infusions 09/11/21 1700  Exposed Catheter (cm) 2 cm 09/11/21 1700  Site Assessment Clean;Dry;Intact 09/11/21 1700  Lumen #1 Status Flushed;Saline locked;Blood return noted 09/11/21 1700  Lumen #2 Status Flushed;Saline locked;Blood return noted 09/11/21 1700  Lumen #3 Status Flushed;Saline locked;Blood return noted 09/11/21 1700  Dressing Type Transparent;Securing device 09/11/21 1700  Dressing Status Clean;Intact;Dry 09/11/21 1700  Antimicrobial disc in place? Yes 09/11/21 1700  Safety Lock Not Applicable 21/11/55 2080  Line Care Connections checked and tightened 09/11/21 1700  Dressing Intervention New dressing 09/11/21 1700  Dressing Change Due 09/18/21 09/11/21 1700       Darlyn Read 09/11/2021, 5:25 PM

## 2021-09-11 NOTE — Progress Notes (Signed)
Inpatient Diabetes Program Recommendations  AACE/ADA: New Consensus Statement on Inpatient Glycemic Control (2015)  Target Ranges:  Prepandial:   less than 140 mg/dL      Peak postprandial:   less than 180 mg/dL (1-2 hours)      Critically ill patients:  140 - 180 mg/dL   Lab Results  Component Value Date   GLUCAP 256 (H) 09/11/2021   HGBA1C 8.2 (H) 09/08/2021    Review of Glycemic Control  Latest Reference Range & Units 09/10/21 16:38 09/10/21 21:53 09/11/21 00:27 09/11/21 03:00 09/11/21 07:30 09/11/21 11:08  Glucose-Capillary 70 - 99 mg/dL 150 (H) 225 (H) 256 (H) 179 (H) 233 (H) 256 (H)   Diabetes history: DM 2 Outpatient Diabetes medications:  None Current orders for Inpatient glycemic control:  Novolog moderate q 4 hours Osmolite 70 ml/hr  Inpatient Diabetes Program Recommendations:    May consider adding tube feed coverage - Novolog 2 units q 4 hours.    Thanks,   Adah Perl, RN, BC-ADM Inpatient Diabetes Coordinator Pager [phone]  (8a-5p)

## 2021-09-11 NOTE — Progress Notes (Signed)
NAME:  [name], MRN:  [id number], DOB:  12/18/1965, LOS: 4 ADMISSION DATE:  09/18/2021, CONSULTATION DATE:  09/08/2021 REFERRING MD:  Dr. Kurtis Bushman, CHIEF COMPLAINT:   Rt Foot pain  Pertinent  Medical History  T2DM HTN ETOH Dyslipidemia TMA of the RIGHT foot 05/14/21 Brief HPI:  55 year old male presenting to [facility] ED on 09/26/2021 with complaints of feeling tired over the past day in the setting of of a wound on the lateral aspect of his right foot with foul-smelling drainage and increased pain. 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  Post surgery he followed up with the podiatrist once on 07/09/2021 at which time he had a 2 x 2 full-thickness ulcer without signs of infection. Upon arrival patient was tachycardic in A-fib RVR, tachypneic with elevated lactic acid and procalcitonin levels> he was worked up per sepsis protocol with IV fluid and empiric antibiotic therapy.  There was also concern for possible NSTEMI and a heparin drip was started.   Hospital course: Podiatry consulted whose recommendations were vascular surgical intervention for potential amputation of RLE due to the concern for osteomyelitis & x-rays revealing soft tissue gas. Vascular surgery recommended right trans tibial amputation, which was performed 08/31/2021. Cardiology following due to N-STEMI, HFrEF and PAF on heparin drip with plans for ischemic work-up once stabilized from surgery & infection. Nephrology consulted for guidance on hypercalcemia, and ID following to assist with treatment of R ft infection. Patient continues to be altered in the setting of ETOH use & infection. [place] 09/08/21: negative for acute abnormality. Post-op the patient was lethargic & hypotensive after receiving morphine & ativan and transferred to SDU.  PCCM later consulted to assist with management and monitoring.  Significant Hospital Events: Including procedures, antibiotic start and stop dates in addition to other  pertinent events   09/24/2021: Admitted by [facility] with sepsis s/t R infection/ N-STEMI/ A-fib RVR with X-ray showing soft tissue gas. Podiatry & vascular consulted 09/08/21: Pt confused, CTH negative, CIWA initiated. Cardiology consulted for management of N-STEMI & A-fib RVR with plans for ischemic w/u after vascular intervention and pt stabilization 09/06/2021: status post guillotine R BKA, in post op patient lethargic & hypotensive transferred to SDU. PCCM consulted.  Interim History / Subjective:  Patient similar to post-op description, somnolent responsive to pain & physical stimuli, moving all extremities with some groaning. VSS.  Objective   Blood pressure 133/83, pulse 98, temperature 98.1 F (36.7 C), temperature source Axillary, resp. rate (!) 22, height 6\' 3"  (1.905 m), weight 94.8 kg, SpO2 94 %.        Intake/Output Summary (Last 24 hours) at 09/11/2021 0931 Last data filed at 09/11/2021 0825 Gross per 24 hour  Intake 5750.03 ml  Output 2900 ml  Net 2850.03 ml    Filed Weights   09/11/2021 0648 09/17/2021 1848  Weight: 93 kg 94.8 kg   Physical Exam Vitals reviewed.  Constitutional:      General: He is not in acute distress.    Appearance: He is obese. He is ill-appearing.     Interventions: He is not intubated. HENT:     Head: Normocephalic and atraumatic.     Right Ear: External ear normal.     Left Ear: External ear normal.     Mouth/Throat:     Mouth: Mucous membranes are moist.     Pharynx: No posterior oropharyngeal erythema.  Eyes:     General: No scleral icterus.    Extraocular  Movements: Extraocular movements intact.     Pupils: Pupils are equal, round, and reactive to light.  Cardiovascular:     Rate and Rhythm: Tachycardia present. Occasional Extrasystoles are present.    Pulses: Normal pulses.     Heart sounds: Normal heart sounds.  Pulmonary:     Effort: No accessory muscle usage, respiratory distress or retractions. He is not intubated.     Breath sounds:  Examination of the right-lower field reveals decreased breath sounds. Examination of the left-lower field reveals decreased breath sounds. Decreased breath sounds present. No wheezing, rhonchi or rales.  Abdominal:     General: Abdomen is protuberant. Bowel sounds are normal. There is no distension.     Palpations: Abdomen is soft.     Tenderness: There is no abdominal tenderness. There is no guarding or rebound.  Musculoskeletal:     Left lower leg: Edema present.     Comments: RIGHT BKA, post op dressing in place  Neurological:     Mental Status: He is lethargic.     GCS: GCS eye subscore is 3. GCS verbal subscore is 2. GCS motor subscore is 5.     ASSESSMENT & PLAN   RESP: -Remains on RA or low [place] -Initial CXR clear lung fields -Still having some CSR, with element of OSA (likely mandibular geometry) -CXR 12/13 with stigmata of volume overload -BNP 600 -8L net positive Will volume reduce today, lasix, and eval output/renal indices  NEURO: Acute Encephalopathy secondary to suspected withdrawal in the setting of ETOH use & infection CTH 12/11 negative, STAT ABG: 7.49/40/69/30.5, Lactic: 1.3 -MRI confounded by motion, may need to repeat pending clinical course - Exclude CIWA, AMS may be multi-factorial  - Replaced CIWA  with standing low dose of Valium and monitor - Folic acid, multi-vitamin daily - high dose thiamine taper - limit sedating medications as tolerated, consider precedex if patient becomes agitated - seizure & fall precautions - NGT placed to facilitate PO medications - HIGH RISK for intubation > currently protecting airway - Ammonia level 14/WNL  ID: Sepsis without septic shock secondary to acute osteomyelitis in the setting of 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 Lab Results  Component Value Date   WBC 7.2 09/10/2021   Temp (24hrs), Avg:98 F (36.7 C), Min:97.9 F (36.6 C), Max:98.1 F (36.7 C)  Lactic: 1.3  Specimen Description WOUND   Performed at [facility], [address] [place], [place] [address]   Special Requests RIGHT FOOT   Gram Stain MODERATE WBC PRESENT, PREDOMINANTLY MONONUCLEAR  MODERATE GRAM POSITIVE COCCI IN PAIRS AND CHAINS  MODERATE GRAM NEGATIVE RODS  Performed at [facility], [place] [address], [place],  [address]   Culture RARE STAPHYLOCOCCUS AUREUS  SUSCEPTIBILITIES TO FOLLOW  WITHIN MIXED ORGANISMS  MIXED ANAEROBIC FLORA PRESENT.  CALL LAB IF FURTHER IID REQUIRED.    - Daily CBC, monitor WBC/ fever curve, lactic acid WNL - IV antibiotics: zosyn & vancomycin * per ID note will need prolonged course due to PMHx of osteomyelitis - Strict I/O's: alert provider if UOP < 0.5 mL/kg/hr - ID consulted, appreciate input - vascular following, appreciate input  CV: ECHO 09/22/2021 IMPRESSIONS:  1. Left ventricular ejection fraction, by estimation, is 30 to 35%.       Regional wall motion abnormalities and is mildly dilated.         -there is severe hypokinesis of the left ventricular,         -mid-apical anteroseptal wall and  anterior wall.          -akinesis of the left ventricular, apical segment.             There is mild left ventricular hypertrophy.        Left ventricular diastolic parameters are indeterminate.    2. Right ventricular systolic function is normal.        The right ventricular  size is normal.    3. Left atrial size was moderately dilated.    4. No significant valvular dysfunction noted   HFrEF/ Presumed ICM- LVEF 30-35%, severe hypokinesis N-Stemi- troponin peak 5943 PAF - currently NSR - continue metoprolol, statin, ASA via NGT - if A-fib recurs consider amiodarone - continuous cardiac monitoring - Cardiology consulted, appreciate input > plan for ischemic w/u and cath once stabilized  RENAL/FEN: Lab Results  Component Value Date   CREATININE 0.76 09/11/2021   BUN 22 (H) 09/11/2021   NA 139 09/11/2021   K 2.8 (L) 09/11/2021   CL 106 09/11/2021    CO2 30 09/11/2021      Intake/Output Summary (Last 24 hours) at 09/11/2021 0931 Last data filed at 09/11/2021 0825 Gross per 24 hour  Intake 5750.03 ml  Output 2900 ml  Net 2850.03 ml    Net IO Since Admission: 8,419.41 mL [09/11/21 0931]  Hypercalcemia s/t Hyperparathyroidism 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 No significant sonographic abnormality of the thyroid. 2. 1.5 x 1.3 x 1.3 cm hypoechoic mass seen inferior and posterior to the inferior tip of the left thyroid lobe is suspicious for a parathyroid adenoma.  - Sensipar started, only 1 dose received due to lethargy, unable to give via NGT > Rx looking into alternative options for NGT administration - Would force diurese at this time with lasix dosing to account for IVF for mild positive balance - Nephrology following, appreciate input -Will volume reduce today, lasix eval output/renal indices  ENDO: Uncontrolled Type 2 Diabetes Mellitus Hemoglobin A1C: 8.2 - Monitor CBG Q 4 hours while NPO - SSI on hold while PO intake poor: CBG's 100-150 (12/12) - target range while in ICU: 140-180 - follow ICU hyper/hypo-glycemia protocol  Best Practice (right click and "Reselect all SmartList Selections" daily)  Diet/type: NPO w/ meds via tube DVT prophylaxis: LMWH GI prophylaxis: PPI Lines: N/A Foley:  N/A Code Status:  full code Last date of multidisciplinary goals of care discussion [08/30/2021]  Labs   CBC: Recent Labs  Lab 09/24/2021 0658 09/08/21 0616 09/14/2021 0259 09/10/21 0402  WBC 11.2* 12.4* 8.2 7.2  NEUTROABS 10.6*  --   --   --   HGB 14.4 12.6* 11.9* 12.3*  HCT 41.1 36.2* 34.9* 36.5*  MCV 88.6 87.7 89.3 92.2  PLT 185 164 153 151     Basic Metabolic Panel: Recent Labs  Lab 09/08/21 0616 09/08/21 1918 09/11/2021 0259 09/10/2021 2315 09/10/21 0402 09/11/21 0341  NA 128* 132* 133* 136 137 139  K 3.2*  --  3.0* 3.5 3.6 2.8*  CL 94*  --  100 104 103 106  CO2 29  --  28 31 31 30    GLUCOSE 126*  --  94 122* 138* 210*  BUN 16  --  16 20 19  22*  CREATININE 0.66  --  0.62 0.87 0.82 0.76  CALCIUM 13.3*  --  13.6* 13.2* 13.0* 11.2*  MG 1.6*  --  1.5* 1.8 2.1 1.8  PHOS  --   --  1.6* 2.3* 2.1* <1.0*  GFR: Estimated Creatinine Clearance: 124.7 mL/min (by C-G formula based on SCr of 0.76 mg/dL). Recent Labs  Lab 08/31/2021 0658 09/08/2021 0912 09/14/2021 1206 09/16/2021 1444 09/08/21 0616 09/22/2021 0259 09/04/2021 1935 09/10/21 0402  PROCALCITON 15.80  --   --   --  8.58  --   --   --   WBC 11.2*  --   --   --  12.4* 8.2  --  7.2  LATICACIDVEN 2.9* 2.9* 2.8* 2.8*  --   --  1.3  --      Liver Function Tests: Recent Labs  Lab 09/02/2021 0658 09/10/21 0402  AST 33 39  ALT 23 19  ALKPHOS 94 78  BILITOT 3.1* 1.3*  PROT 6.4* 5.7*  ALBUMIN 2.6* 1.8*    No results for input(s): LIPASE, AMYLASE in the last 168 hours. Recent Labs  Lab 09/10/21 0402  AMMONIA 14     ABG    Component Value Date/Time   PHART 7.49 (H) 09/06/2021 1910   PCO2ART 40 09/27/2021 1910   PO2ART 69 (L) 09/14/2021 1910   HCO3 33.2 (H) 09/10/2021 0402   O2SAT 31.2 09/10/2021 0402      Coagulation Profile: Recent Labs  Lab 09/06/2021 0659 08/29/2021 0259  INR 1.2 1.6*     Cardiac Enzymes: No results for input(s): CKTOTAL, CKMB, CKMBINDEX, TROPONINI in the last 168 hours.  HbA1C: Hgb A1c MFr Bld  Date/Time Value Ref Range Status  09/02/2021 06:58 AM 8.2 (H) 4.8 - 5.6 % Final    Comment:    (NOTE)         Prediabetes: 5.7 - 6.4         Diabetes: >6.4         Glycemic control for adults with diabetes: <7.0   05/10/2021 05:00 AM 6.4 (H) 4.8 - 5.6 % Final    Comment:    (NOTE) Pre diabetes:          5.7%-6.4%  Diabetes:              >6.4%  Glycemic control for   <7.0% adults with diabetes     CBG: Recent Labs  Lab 09/10/21 1638 09/10/21 2153 09/11/21 0027 09/11/21 0300 09/11/21 0730  GLUCAP 150* 225* 256* 179* 233*     Review of Systems:   UTA- patient  somnolent unable to participate in interview  Past Medical History:  He,  has a past medical history of Diabetes ([place]) and Hypertension.   Surgical History:   Past Surgical History:  Procedure Laterality Date   AMPUTATION Right 09/28/2021   Procedure: RIGHT OPEN GUILLOTINE BELOW KNEE AMPUTATION;  Surgeon: Algernon Huxley, MD;  Location: [facility] ORS;  Service: Vascular;  Laterality: Right;   AMPUTATION TOE Right 07/11/2020   Procedure: AMPUTATION TOE-Right Great Toe;  Surgeon: Samara Deist, DPM;  Location: [facility] ORS;  Service: Podiatry;  Laterality: Right;   APPLICATION OF WOUND VAC Right 09/28/2021   Procedure: APPLICATION OF WOUND VAC;  Surgeon: Algernon Huxley, MD;  Location: [facility] ORS;  Service: Vascular;  Laterality: Right;   I & D EXTREMITY Right 05/14/2021   Procedure: IRRIGATION AND DEBRIDEMENT EXTREMITY- RIGHT FOOT;  Surgeon: Samara Deist, DPM;  Location: [facility] ORS;  Service: Podiatry;  Laterality: Right;   IRRIGATION AND DEBRIDEMENT ABSCESS Right 10/31/2018   Procedure: IRRIGATION AND DEBRIDEMENT ABSCESS;  Surgeon: Samara Deist, DPM;  Location: [facility] OR[facility];  Service: Podiatry;  Laterality: Right;   IRRIGATION AND DEBRIDEMENT FOOT Right 11/02/2018   Procedure: IRRIGATION AND DEBRIDEMENT  FOOT;  Surgeon: Sharlotte Alamo, DPM;  Location: [facility] OR[facility];  Service: Podiatry;  Laterality: Right;   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   Procedure: TRANSMETATARSAL AMPUTATION;  Surgeon: Samara Deist, DPM;  Location: [facility] OR[facility];  Service: Podiatry;  Laterality: Right;     Social History:   reports that he has been smoking cigars. He has never used smokeless tobacco. He reports current alcohol use. He reports that he does not use drugs.   Family History:  His family history includes CAD in his brother and father.   Allergies No Known Allergies   Home Medications  Prior to Admission medications   Medication Sig Start Date End Date Taking? Authorizing Provider  acetaminophen (TYLENOL) 325 MG tablet  Take 2 tablets (650 mg total) by mouth every 6 (six) hours as needed for mild pain (or Fever >/= 101). 05/22/21  Yes Arrien, Jimmy Picket, MD  oxyCODONE (OXY IR/ROXICODONE) 5 MG immediate release tablet Take 1 tablet (5 mg total) by mouth every 6 (six) hours as needed for moderate pain. 05/22/21  Yes Arrien, Jimmy Picket, MD  amoxicillin-clavulanate (AUGMENTIN) 875-125 MG tablet Take 1 tablet by mouth 2 (two) times daily  for 14 days. Patient not taking: Reported on 09/28/2021 06/13/21   Tsosie Billing, MD  atorvastatin (LIPITOR) 40 MG tablet Take 40 mg by mouth daily.    [provider]  glipiZIDE (GLUCOTROL XL) 10 MG 24 hr tablet Take 20 mg by mouth daily with breakfast. Patient not taking: Reported on 09/06/2021    [provider]     Critical Care Time devoted to patient care services described in this note is ... minutes.  Overall, patient is critically ill, prognosis is guarded.   Patient with Multiorgan failure and at high risk for cardiac arrest and death.  Images reviewed directly and interpretation in A&P is my own unless noted Labs reviewed and evaluated as noted in A&P  Critical care time: 45 minutes   //Quaneisha Hanisch

## 2021-09-11 NOTE — Plan of Care (Signed)
PMT note:  Consult noted. Notes reviewed. In to see patient. He is staring at the ceiling, reaching to the sky in mitten restraints. He is unable to answer questions or participate in a [place] conversation.   Called his NOK, his sister [name] and left a VM with a call back number in hopes of having a [place] conversation as soon as possible.

## 2021-09-11 NOTE — Progress Notes (Signed)
PROGRESS NOTE    [name]  VVO:[id number] DOB: 05-19-1966 DOA: 09/21/2021 PCP: Pcp, No    Brief Narrative:  55 y.o. 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 Patient states that postsurgery he followed-up with the podiatrist once but has not gone back since then.  Chart review showed he followed up with the podiatrist on 07/09/2021 and at that time he had a 2 x 2 full-thickness ulcer without signs of infection.   12/11 dressing wet, weeping with fluid and some pus 12/12 pt is s/p guillotine amputation and VAC placed. Plan to go to OR for AKA on Thursday. Tmax 102.5 12/13 was transferred to SDU for closer monitoring yesterday as he was post-op lethargic and hypotensive.. This am confused in mittens. Sister revealed to me today pt is a long standing alcoholic.  26/94 patient still agitated.  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 Plan: Broad-spectrum antibiotics and surgical plan as above  NSTEMI Chronic systolic just of heart failure Echo with EF 30-35%, with regional [facility] is following.-At this point not a good candidate for cardiac cath due to his mental status and active infection  Likely hold off on ischemic w/u since with foot infection/sepsis. Was on heparin gtt. Now on lovenox dvt ppx Continue asa, statin, beta blk  PAF In setting of above stressors 12/13 in SR now.  Continue beta-blockers long-term anticoagulation may be considered in the near future, will defer to cardiology  Acute metabolic encephalopathy Acute alcohol withdrawals, DTs 2/2 infection v.s. etoh withdrawal (sister confirmed he was a longstanding alcoholic.)  Suspect more element of alcohol withdrawal at this point MRI negative but affected by motion artifact Plan: Precedex gtt. 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  Attempted to call Sister Mickel Baas.  [phone].  No answer Disposition Plan: Status is: Inpatient  Remains inpatient  appropriate because: Severe metabolic encephalopathy.  DTs.  Infection of right BKA stump       Level of care: Stepdown  Consultants:  Vascular surgery Nephrology PCCM  Procedures:  Right BKA  Antimicrobials: Vancomycin Zosyn   Subjective: Seen and examined.  Altered.  In mittens.  Unable to provide history  Objective: Vitals:   09/11/21 1300 09/11/21 1315 09/11/21 1330 09/11/21 1345  BP:  (!) 117/59 103/75 105/68  Pulse: (!) 113 (!) 105 97 92  Resp: (!) 22 (!) 32 (!) 28 (!) 26  Temp:      TempSrc:      SpO2: 98% 100% 98% 98%  Weight:      Height:        Intake/Output Summary (Last 24 hours) at 09/11/2021 1437 Last data filed at 09/11/2021 1236 Gross per 24 hour  Intake 6501.02 ml  Output 2150 ml  Net 4351.02 ml   Filed Weights   09/28/2021 0648 09/19/2021 1848  Weight: 93 kg 94.8 kg    Examination:  General exam: Altered.  Mildly agitated.  In mittens Respiratory system: Poor respiratory effort.  Scattered crackles.  Normal work of breathing. Cardiovascular system: Tachycardic, S1-S2, no murmurs, no pedal edema Gastrointestinal system: Soft, NT/ND, normal bowel sounds Central nervous system: Lethargic altered, oriented x0 Extremities: Unable to assess power Skin: No rashes, lesions or ulcers Psychiatry: Judgement and insight appear impaired. Mood & affect confused, agitated.     Data Reviewed: I have personally reviewed following labs and imaging studies  CBC: Recent Labs  Lab 09/03/2021 0658 09/08/21 0616 09/24/2021 0259 09/10/21 0402  WBC 11.2* 12.4* 8.2 7.2  NEUTROABS 10.6*  --   --   --   HGB 14.4 12.6* 11.9* 12.3*  HCT 41.1 36.2* 34.9* 36.5*  MCV 88.6 87.7 89.3 92.2  PLT 185 164 153 737   Basic Metabolic Panel: Recent Labs  Lab 09/08/21 0616 09/08/21 1918 09/05/2021 0259 09/21/2021 2315 09/10/21 0402 09/11/21 0341 09/11/21 1144  NA 128* 132* 133* 136 137 139  --   K 3.2*  --  3.0* 3.5 3.6 2.8*  --   CL 94*  --  100 104 103 106  --    CO2 29  --  28 31 31 30   --   GLUCOSE 126*  --  94 122* 138* 210*  --   BUN 16  --  16 20 19  22*  --   CREATININE 0.66  --  0.62 0.87 0.82 0.76  --   CALCIUM 13.3*  --  13.6* 13.2* 13.0* 11.2*  --   MG 1.6*  --  1.5* 1.8 2.1 1.8 1.8  PHOS  --   --  1.6* 2.3* 2.1* <1.0*  --    GFR: Estimated Creatinine Clearance: 124.7 mL/min (by C-G formula based on SCr of 0.76 mg/dL). Liver Function Tests: Recent Labs  Lab 09/06/2021 0658 09/10/21 0402  AST 33 39  ALT 23 19  ALKPHOS 94 78  BILITOT 3.1* 1.3*  PROT 6.4* 5.7*  ALBUMIN 2.6* 1.8*   No results for input(s): LIPASE, AMYLASE in the last 168 hours. Recent Labs  Lab 09/10/21 0402  AMMONIA 14   Coagulation Profile: Recent Labs  Lab 09/17/2021 0659 09/25/2021 0259  INR 1.2 1.6*   Cardiac Enzymes: No results for input(s): CKTOTAL, CKMB, CKMBINDEX, TROPONINI in the last  168 hours. BNP (last 3 results) No results for input(s): PROBNP in the last 8760 hours. HbA1C: No results for input(s): HGBA1C in the last 72 hours. CBG: Recent Labs  Lab 09/10/21 2153 09/11/21 0027 09/11/21 0300 09/11/21 0730 09/11/21 1108  GLUCAP 225* 256* 179* 233* 256*   Lipid Profile: No results for input(s): CHOL, HDL, LDLCALC, TRIG, CHOLHDL, LDLDIRECT in the last 72 hours. Thyroid Function Tests: No results for input(s): TSH, T4TOTAL, FREET4, T3FREE, THYROIDAB in the last 72 hours. Anemia Panel: No results for input(s): VITAMINB12, FOLATE, FERRITIN, TIBC, IRON, RETICCTPCT in the last 72 hours. Sepsis Labs: Recent Labs  Lab 09/14/2021 0658 09/21/2021 0912 09/28/2021 1206 09/06/2021 1444 09/08/21 0616 09/28/2021 1935  PROCALCITON 15.80  --   --   --  8.58  --   LATICACIDVEN 2.9* 2.9* 2.8* 2.8*  --  1.3    Recent Results (from the past 240 hour(s))  Urine Culture     Status: None   Collection Time: 09/03/2021  6:48 AM   Specimen: Urine, Random  Result Value Ref Range Status   Specimen Description   Final    URINE, RANDOM Performed at [facility], [address], [place], [place] [address]    Special Requests   Final    NONE Performed at [facility], [address], [place], [place] [address]    Culture   Final    NO GROWTH Performed at [facility], [place] [address] [place], [place] [address]    Report Status 09/03/2021 FINAL  Final  Culture, blood (routine x 2)     Status: None (Preliminary result)   Collection Time: 08/29/2021  6:58 AM   Specimen: BLOOD  Result Value Ref Range Status   Specimen Description BLOOD LEFT FA  Final   Special Requests   Final    BOTTLES DRAWN AEROBIC AND ANAEROBIC Blood Culture adequate volume   Culture  Setup Time   Final    GRAM POSITIVE RODS ANAEROBIC BOTTLE ONLY CRITICAL RESULT CALLED TO, READ BACK BY AND VERIFIED WITH: NATHAN [email or web address]  09/10/21 RH Performed at [facility], [address], [place], [place] [address]    Culture GRAM POSITIVE RODS  Final   Report Status PENDING  Incomplete  Culture, blood (routine x 2)     Status: None (Preliminary result)   Collection Time: 09/02/2021  6:59 AM   Specimen: BLOOD  Result Value Ref Range Status   Specimen Description BLOOD RIGHT FA  Final   Special Requests   Final    BOTTLES DRAWN AEROBIC AND ANAEROBIC Blood Culture adequate volume   Culture   Final    NO GROWTH 4 DAYS Performed at [facility], [address] [place], [place] [address]    Report Status PENDING  Incomplete  Resp Panel by RT-PCR (Flu A&B, Covid) Nasopharyngeal Swab     Status: None   Collection Time: 09/14/2021  7:44 AM   Specimen: Nasopharyngeal Swab; Nasopharyngeal(NP) swabs in vial transport medium  Result Value Ref Range Status   SARS Coronavirus 2 by RT PCR NEGATIVE NEGATIVE Final    Comment: (NOTE) SARS-CoV-2 target nucleic acids are NOT DETECTED.  The SARS-CoV-2 RNA is generally detectable in upper respiratory specimens during the acute phase of infection. The lowest concentration of SARS-CoV-2 viral copies this  assay can detect is 138 copies/mL. A negative result does not preclude SARS-Cov-2 infection and should not be used as the sole basis for treatment or other patient management decisions.  A negative result may occur with  improper specimen collection/handling, submission of specimen other than nasopharyngeal swab, presence of viral mutation(s) within the areas targeted by this assay, and inadequate number of viral copies(<138 copies/mL). A negative result must be combined with clinical observations, patient history, and epidemiological information. The expected result is Negative.  Fact Sheet for Patients:  EntrepreneurPulse.com.au  Fact Sheet for Healthcare Providers:  IncredibleEmployment.be  This test is no t yet approved or cleared by the [place] FDA and  has been authorized for detection and/or diagnosis of SARS-CoV-2 by FDA under an Emergency Use Authorization (EUA). This EUA will remain  in effect (meaning this test can be used) for the duration of the COVID-19 declaration under Section 564(b)(1) of the Act, 21 U.S.C.section 360bbb-3(b)(1), unless the authorization is terminated  or revoked sooner.       Influenza A by PCR NEGATIVE NEGATIVE Final   Influenza B by PCR NEGATIVE NEGATIVE Final    Comment: (NOTE) The Xpert Xpress SARS-CoV-2/FLU/RSV plus assay is intended as an aid in the diagnosis of influenza from Nasopharyngeal swab specimens and should not be used as a sole basis for treatment. Nasal washings and aspirates are unacceptable for Xpert Xpress SARS-CoV-2/FLU/RSV testing.  Fact Sheet for Patients: EntrepreneurPulse.com.au  Fact Sheet for Healthcare Providers: IncredibleEmployment.be  This test is not yet approved or cleared by the [place] FDA and has been authorized for detection and/or diagnosis of SARS-CoV-2 by FDA under an Emergency Use Authorization (EUA). This EUA will  remain in effect (meaning this test can be used) for the duration of the COVID-19 declaration under Section 564(b)(1) of the Act, 21 U.S.C. section 360bbb-3(b)(1), unless the authorization is terminated or revoked.  Performed at [facility], [address], [place], [place] [address]   Aerobic/Anaerobic Culture w Gram Stain (surgical/deep wound)     Status: None (Preliminary result)   Collection Time: 09/06/2021  8:16 AM   Specimen: Wound  Result Value Ref Range Status   Specimen Description   Final    WOUND Performed at [facility], [address] [place], [place] [address]    Special Requests RIGHT FOOT  Final   Gram Stain   Final    MODERATE WBC PRESENT, PREDOMINANTLY MONONUCLEAR MODERATE GRAM POSITIVE COCCI IN PAIRS AND CHAINS MODERATE GRAM NEGATIVE RODS    Culture   Final    RARE STAPHYLOCOCCUS AUREUS WITHIN MIXED ORGANISMS NO ANAEROBES ISOLATED; CULTURE IN PROGRESS FOR 5 DAYS CORRECTED ON 12/14 AT 1413: PREVIOUSLY REPORTED AS MIXED ANAEROBIC FLORA PRESENT.  CALL LAB IF FURTHER IID REQUIRED. CULTURE REINCUBATED FOR BETTER GROWTH Performed at [facility], [place] [address], [place], [place] [address]    Report Status PENDING  Incomplete   Organism ID, Bacteria STAPHYLOCOCCUS AUREUS  Final      Susceptibility   Staphylococcus aureus - MIC*    CIPROFLOXACIN <=0.5 SENSITIVE Sensitive     ERYTHROMYCIN >=8 RESISTANT Resistant     GENTAMICIN <=0.5 SENSITIVE Sensitive     OXACILLIN 0.5 SENSITIVE Sensitive     TETRACYCLINE >=16 RESISTANT Resistant     VANCOMYCIN <=0.5 SENSITIVE Sensitive     TRIMETH/SULFA <=10 SENSITIVE Sensitive     CLINDAMYCIN <=0.25 SENSITIVE Sensitive     RIFAMPIN <=0.5 SENSITIVE Sensitive     Inducible Clindamycin NEGATIVE Sensitive     * RARE STAPHYLOCOCCUS AUREUS  MRSA Next Gen by PCR, Nasal     Status: None   Collection Time: 09/11/2021  6:58 PM  Specimen: Nasal Mucosa; Nasal Swab  Result Value Ref Range Status   MRSA by  PCR Next Gen NOT DETECTED NOT DETECTED Final    Comment: (NOTE) The GeneXpert MRSA Assay (FDA approved for NASAL specimens only), is one component of a comprehensive MRSA colonization surveillance program. It is not intended to diagnose MRSA infection nor to guide or monitor treatment for MRSA infections. Test performance is not FDA approved in patients less than 75 years old. Performed at [facility], [address], [place], [place] [address]          Radiology Studies: DG Abd 1 View  Result Date: 09/10/2021 CLINICAL DATA:  NG placement. EXAM: ABDOMEN - 1 VIEW COMPARISON:  Earlier radiograph dated 09/19/2021. FINDINGS: Interval advancement of the enteric tube with tip and side port in the epigastric area likely in the proximal stomach. The NGT extends to the distal stomach and fold back on itself. No interval change in the bowel gas pattern. IMPRESSION: Enteric tube with tip and side-port in the proximal stomach. Electronically Signed   By: Anner Crete M.D.   On: 09/10/2021 01:11   DG Abd 1 View  Result Date: 09/24/2021 CLINICAL DATA:  OG tube placement EXAM: ABDOMEN - 1 VIEW COMPARISON:  None. FINDINGS: Esophageal tube tip overlies GE junction, side-port overlies distal esophagus, further advancement by at least 10 cm recommended. IMPRESSION: Esophageal tube side-port overlies distal esophagus, further advancement recommended for more optimal positioning These results will be called to the ordering clinician or representative by the Radiologist Assistant, and communication documented in the PACS or [employer]. Electronically Signed   By: Donavan Foil M.D.   On: 09/16/2021 23:57   MR BRAIN WO CONTRAST  Result Date: 09/10/2021 CLINICAL DATA:  Mental status change, unknown cause EXAM: MRI HEAD WITHOUT CONTRAST TECHNIQUE: Multiplanar, multiecho pulse sequences of the brain and surrounding structures were obtained without intravenous contrast. COMPARISON:  CT head  09/08/2021. FINDINGS: Severely motion limited study with multiple nondiagnostic sequences. Within this limitation: Brain: No evidence of acute infarct. No hydrocephalus. No definite evidence of acute hemorrhage. No midline shift or mass lesion. No visible extra-axial fluid collection. Vascular: Major arterial flow voids are maintained at the skull base. Skull and upper cervical spine: Normal marrow signal. Sinuses/Orbits: Clear sinuses.  Unremarkable orbits. Other: No mastoid effusions. IMPRESSION: Severely motion limited study with multiple nondiagnostic sequences. No obvious/definite acute abnormality; however, pathology could be obscured. If the patient's mental status improves and is able, repeat exam could provide further evaluation. Electronically Signed   By: Margaretha Sheffield M.D.   On: 09/10/2021 14:08   DG Chest Port 1 View  Result Date: 09/10/2021 CLINICAL DATA:  Shortness of breath EXAM: PORTABLE CHEST 1 VIEW COMPARISON:  Previous study done on 09/26/2021 FINDINGS: Transverse diameter of heart is increased. Central pulmonary vessels are more prominent. There is interval increase in interstitial markings in the parahilar regions and lower lung fields. There are small linear densities in the right parahilar region and medial left lower lung fields suggesting subsegmental atelectasis. There is no new focal pulmonary consolidation. Lateral CP angles are clear. There is no definite pneumothorax. There is a metallic foreign body partly obscuring the left apex. Enteric tube is noted traversing the esophagus with its tip in the stomach. IMPRESSION: Cardiomegaly. Central pulmonary vessels are more prominent suggesting CHF. Increased interstitial markings in both lungs suggest pulmonary edema. Electronically Signed   By: Elmer Picker M.D.   On: 09/10/2021 12:45  Scheduled Meds:  aspirin EC  81 mg Oral Daily   atorvastatin  40 mg Per Tube QPM   chlorhexidine  15 mL Mouth Rinse BID    Chlorhexidine Gluconate Cloth  6 each Topical Daily   cinacalcet  30 mg Oral Q supper   diazepam  5 mg Per NG tube Q8H   enoxaparin (LOVENOX) injection  40 mg Subcutaneous Q24H   feeding supplement (PROSource TF)  45 mL Per Tube BID   folic acid  1 mg Per Tube Daily   free water  30 mL Per Tube Q4H   insulin aspart  0-15 Units Subcutaneous Q4H   insulin aspart  2 Units Subcutaneous Q4H   mouth rinse  15 mL Mouth Rinse q12n4p   metoprolol tartrate  25 mg Per Tube BID   multivitamin with minerals  1 tablet Per Tube Daily   nutrition supplement (JUVEN)  1 packet Per Tube BID BM   pantoprazole (PROTONIX) IV  40 mg Intravenous Q24H   spironolactone  25 mg Per Tube Daily   [START ON 09/18/2021] thiamine injection  100 mg Intravenous Daily   Continuous Infusions:  dexmedetomidine (PRECEDEX) IV infusion 0.6 mcg/kg/hr (09/11/21 1236)   feeding supplement (OSMOLITE 1.5 CAL) 1,000 mL (09/10/21 1602)   lactated ringers 75 mL/hr at 09/11/21 1200   piperacillin-tazobactam (ZOSYN)  IV Stopped (09/11/21 1022)   potassium PHOSPHATE IVPB (in mmol)     thiamine injection Stopped (09/11/21 0939)   Followed by   Derrill Memo ON 09/22/2021] thiamine injection     vancomycin Stopped (09/11/21 1041)     LOS: 4 days    Time spent: 35 minutes    Sidney Ace, MD [facility]   If 7PM-7AM, please contact night-coverage  09/11/2021, 2:37 PM

## 2021-09-11 NOTE — Progress Notes (Addendum)
[facility] Daily Progress Note  09/17/2021: Open amputation (guillotine) right lower leg with VAC dressing placement  Subjective: Patient lying in bed grunting.  Objective: Vitals:   09/11/21 0902 09/11/21 1114 09/11/21 1131 09/11/21 1134  BP: 133/83 (!) 135/91    Pulse: 98 96  (!) 112  Resp: (!) 22 19  (!) 28  Temp:   98.1 F (36.7 C)   TempSrc:   Axillary   SpO2: 95% (!) 89%  94%  Weight:      Height:        Intake/Output Summary (Last 24 hours) at 09/11/2021 1242 Last data filed at 09/11/2021 1150 Gross per 24 hour  Intake 6501.02 ml  Output 2150 ml  Net 4351.02 ml   Physical Exam: Confused and agitated, NAD CV: RRR Pulmonary: CTA Bilaterally Abdomen: Soft, Nontender, Nondistended Vascular:             VAC: Good seal and to suction.   Laboratory: CBC    Component Value Date/Time   WBC 7.2 09/10/2021 0402   HGB 12.3 (L) 09/10/2021 0402   HCT 36.5 (L) 09/10/2021 0402   PLT 151 09/10/2021 0402   BMET    Component Value Date/Time   NA 139 09/11/2021 0341   K 2.8 (L) 09/11/2021 0341   CL 106 09/11/2021 0341   CO2 30 09/11/2021 0341   GLUCOSE 210 (H) 09/11/2021 0341   BUN 22 (H) 09/11/2021 0341   CREATININE 0.76 09/11/2021 0341   CALCIUM 11.2 (H) 09/11/2021 0341   GFRNONAA >60 09/11/2021 0341   GFRAA >60 06/12/2019 1556   Assessment/Planning: Plan on returning to the OR Thursday the patient is stable enough for completion most likely above-the-knee amputation.  Patient had large amounts of pus draining from the calf.  Unsure if he would be able to heal a below the knee amputation.  Discussed with Dr. Lucky Cowboy  @1245pm  left message for patient's Sister Leone Payor in regard to obtaining consent for above-the-knee amputation tomorrow.  Marcelle Overlie PA-C 09/11/2021 12:42 PM

## 2021-09-11 NOTE — Progress Notes (Signed)
PICC order received. Spoke to primary RN re: PICC order. Attempted to call sister for PICC consent, no answer. Primary RN made aware, patient has 2 working PIV's, aware PICC will be placed tomorrow. Will follow up.

## 2021-09-12 ENCOUNTER — Inpatient Hospital Stay: Payer: Medicaid Other

## 2021-09-12 ENCOUNTER — Encounter: Payer: Self-pay | Admitting: Internal Medicine

## 2021-09-12 ENCOUNTER — Inpatient Hospital Stay: Payer: Medicaid Other | Admitting: Anesthesiology

## 2021-09-12 ENCOUNTER — Encounter: Admission: EM | Disposition: E | Payer: Self-pay | Source: Home / Self Care | Attending: Internal Medicine

## 2021-09-12 DIAGNOSIS — Z7189 Other specified counseling: Secondary | ICD-10-CM

## 2021-09-12 DIAGNOSIS — L97519 Non-pressure chronic ulcer of other part of right foot with unspecified severity: Secondary | ICD-10-CM

## 2021-09-12 DIAGNOSIS — A419 Sepsis, unspecified organism: Secondary | ICD-10-CM

## 2021-09-12 DIAGNOSIS — E11628 Type 2 diabetes mellitus with other skin complications: Secondary | ICD-10-CM

## 2021-09-12 DIAGNOSIS — E440 Moderate protein-calorie malnutrition: Secondary | ICD-10-CM

## 2021-09-12 DIAGNOSIS — I96 Gangrene, not elsewhere classified: Secondary | ICD-10-CM

## 2021-09-12 DIAGNOSIS — E1165 Type 2 diabetes mellitus with hyperglycemia: Secondary | ICD-10-CM

## 2021-09-12 DIAGNOSIS — L089 Local infection of the skin and subcutaneous tissue, unspecified: Secondary | ICD-10-CM

## 2021-09-12 HISTORY — PX: INCISION AND DRAINAGE: SHX5863

## 2021-09-12 LAB — BASIC METABOLIC PANEL
Anion gap: 4 — ABNORMAL LOW (ref 5–15)
BUN: 28 mg/dL — ABNORMAL HIGH (ref 6–20)
CO2: 31 mmol/L (ref 22–32)
Calcium: 10.1 mg/dL (ref 8.9–10.3)
Chloride: 108 mmol/L (ref 98–111)
Creatinine, Ser: 0.81 mg/dL (ref 0.61–1.24)
GFR, Estimated: >60 mL/min (ref >60)
Glucose, Bld: 203 mg/dL — ABNORMAL HIGH (ref 70–99)
Potassium: 3.6 mmol/L (ref 3.5–5.1)
Sodium: 143 mmol/L (ref 135–145)

## 2021-09-12 LAB — BLOOD GAS, ARTERIAL
Acid-Base Excess: 7.1 mmol/L — ABNORMAL HIGH (ref 0.0–2.0)
Bicarbonate: 32 mmol/L — ABNORMAL HIGH (ref 20.0–28.0)
FIO2: 0.4
MECHVT: 500 mL
Mechanical Rate: 20
O2 Saturation: 95.8 %
PEEP: 5 cmH2O
Patient temperature: 37
pCO2 arterial: 46 mmHg (ref 32.0–48.0)
pH, Arterial: 7.45 (ref 7.350–7.450)
pO2, Arterial: 77 mmHg — ABNORMAL LOW (ref 83.0–108.0)

## 2021-09-12 LAB — CULTURE, BLOOD (ROUTINE X 2)
Culture: NO GROWTH
Special Requests: ADEQUATE

## 2021-09-12 LAB — GLUCOSE, CAPILLARY
Glucose-Capillary: 210 mg/dL — ABNORMAL HIGH (ref 70–99)
Glucose-Capillary: 133 mg/dL — ABNORMAL HIGH (ref 70–99)
Glucose-Capillary: 115 mg/dL — ABNORMAL HIGH (ref 70–99)
Glucose-Capillary: 126 mg/dL — ABNORMAL HIGH (ref 70–99)
Glucose-Capillary: 135 mg/dL — ABNORMAL HIGH (ref 70–99)
Glucose-Capillary: 156 mg/dL — ABNORMAL HIGH (ref 70–99)
Glucose-Capillary: 164 mg/dL — ABNORMAL HIGH (ref 70–99)

## 2021-09-12 LAB — CBC
HCT: 32.1 % — ABNORMAL LOW (ref 39.0–52.0)
Hemoglobin: 10.5 g/dL — ABNORMAL LOW (ref 13.0–17.0)
MCH: 31.3 pg (ref 26.0–34.0)
MCHC: 32.7 g/dL (ref 30.0–36.0)
MCV: 95.5 fL (ref 80.0–100.0)
Platelets: 234 10*3/uL (ref 150–400)
RBC: 3.36 MIL/uL — ABNORMAL LOW (ref 4.22–5.81)
RDW: 12.7 % (ref 11.5–15.5)
WBC: 14 10*3/uL — ABNORMAL HIGH (ref 4.0–10.5)
nRBC: 0 % (ref 0.0–0.2)

## 2021-09-12 LAB — PROTIME-INR
INR: 1.6 — ABNORMAL HIGH (ref 0.8–1.2)
Prothrombin Time: 19.1 seconds — ABNORMAL HIGH (ref 11.4–15.2)

## 2021-09-12 LAB — BRAIN NATRIURETIC PEPTIDE: B Natriuretic Peptide: 980.5 pg/mL — ABNORMAL HIGH (ref 0.0–100.0)

## 2021-09-12 LAB — CORTISOL-AM, BLOOD: Cortisol - AM: 23.1 ug/dL — ABNORMAL HIGH (ref 6.7–22.6)

## 2021-09-12 LAB — TSH: TSH: 2.153 u[IU]/mL (ref 0.350–4.500)

## 2021-09-12 LAB — PHOSPHORUS: Phosphorus: 1.7 mg/dL — ABNORMAL LOW (ref 2.5–4.6)

## 2021-09-12 LAB — MAGNESIUM: Magnesium: 1.7 mg/dL (ref 1.7–2.4)

## 2021-09-12 SURGERY — AMPUTATION, ABOVE KNEE
Anesthesia: Choice | Site: Knee | Laterality: Right

## 2021-09-12 SURGERY — INCISION AND DRAINAGE
Anesthesia: General | Laterality: Right

## 2021-09-12 MED ORDER — MIDAZOLAM HCL 2 MG/2ML IJ SOLN
2.0000 mg | INTRAMUSCULAR | Status: DC | PRN
  Administered 2021-09-13 (×2): 2 mg via INTRAVENOUS
  Filled 2021-09-12 (×2): qty 2

## 2021-09-12 MED ORDER — PROPOFOL 10 MG/ML IV BOLUS
INTRAVENOUS | Status: DC | PRN
  Administered 2021-09-12: 30 mg via INTRAVENOUS

## 2021-09-12 MED ORDER — IBUPROFEN 100 MG/5ML PO SUSP
600.0000 mg | Freq: Once | ORAL | Status: AC
  Administered 2021-09-12: 600 mg
  Filled 2021-09-12: qty 30

## 2021-09-12 MED ORDER — PROSOURCE TF PO LIQD
45.0000 mL | Freq: Three times a day (TID) | ORAL | Status: DC
Start: 2021-09-12 — End: 2021-09-13
  Administered 2021-09-12 – 2021-09-13 (×2): 45 mL
  Filled 2021-09-12 (×4): qty 45

## 2021-09-12 MED ORDER — FENTANYL CITRATE (PF) 100 MCG/2ML IJ SOLN: 100.0000 ug | Freq: Once | INTRAMUSCULAR | Status: AC

## 2021-09-12 MED ORDER — ETOMIDATE 2 MG/ML IV SOLN: 20.0000 mg | Freq: Once | INTRAVENOUS | Status: AC

## 2021-09-12 MED ORDER — MIDAZOLAM HCL 2 MG/2ML IJ SOLN
2.0000 mg | INTRAMUSCULAR | Status: AC | PRN
  Administered 2021-09-13 (×3): 2 mg via INTRAVENOUS

## 2021-09-12 MED ORDER — FENTANYL CITRATE PF 50 MCG/ML IJ SOSY
50.0000 ug | PREFILLED_SYRINGE | Freq: Once | INTRAMUSCULAR | Status: AC
  Administered 2021-09-12: 50 ug via INTRAVENOUS

## 2021-09-12 MED ORDER — ROCURONIUM BROMIDE 50 MG/5ML IV SOLN: 50.0000 mg | Freq: Once | INTRAVENOUS | Status: AC

## 2021-09-12 MED ORDER — VITAL 1.5 CAL PO LIQD
1000.0000 mL | ORAL | Status: DC
  Administered 2021-09-12: 1000 mL
  Filled 2021-09-12 (×2): qty 1000

## 2021-09-12 MED ORDER — ORAL CARE MOUTH RINSE
15.0000 mL | OROMUCOSAL | Status: DC
  Administered 2021-09-12 – 2021-09-13 (×11): 15 mL via OROMUCOSAL

## 2021-09-12 MED ORDER — ETOMIDATE 2 MG/ML IV SOLN
INTRAVENOUS | Status: AC
  Administered 2021-09-12: 20 mg via INTRAVENOUS
  Filled 2021-09-12: qty 10

## 2021-09-12 MED ORDER — DOCUSATE SODIUM 50 MG/5ML PO LIQD
100.0000 mg | Freq: Two times a day (BID) | ORAL | Status: DC
  Administered 2021-09-12 – 2021-09-13 (×3): 100 mg
  Filled 2021-09-12 (×3): qty 10

## 2021-09-12 MED ORDER — MIDAZOLAM HCL 2 MG/2ML IJ SOLN
INTRAMUSCULAR | Status: DC | PRN
Start: 2021-09-12 — End: 2021-09-12
  Administered 2021-09-12 (×2): 1 mg via INTRAVENOUS

## 2021-09-12 MED ORDER — ROCURONIUM BROMIDE 100 MG/10ML IV SOLN
INTRAVENOUS | Status: DC | PRN
  Administered 2021-09-12: 30 mg via INTRAVENOUS

## 2021-09-12 MED ORDER — ASPIRIN 81 MG PO CHEW
81.0000 mg | CHEWABLE_TABLET | Freq: Every day | ORAL | Status: DC
  Administered 2021-09-13: 81 mg
  Filled 2021-09-12: qty 1

## 2021-09-12 MED ORDER — POLYETHYLENE GLYCOL 3350 17 G PO PACK
17.0000 g | PACK | Freq: Every day | ORAL | Status: DC
  Administered 2021-09-12 – 2021-09-13 (×2): 17 g
  Filled 2021-09-12 (×2): qty 1

## 2021-09-12 MED ORDER — MIDAZOLAM HCL 2 MG/2ML IJ SOLN
INTRAMUSCULAR | Status: AC
  Filled 2021-09-12: qty 2

## 2021-09-12 MED ORDER — PROPOFOL 1000 MG/100ML IV EMUL
5.0000 ug/kg/min | INTRAVENOUS | Status: DC
  Administered 2021-09-12: 5 ug/kg/min via INTRAVENOUS
  Administered 2021-09-13: 45 ug/kg/min via INTRAVENOUS
  Administered 2021-09-13: 25 ug/kg/min via INTRAVENOUS
  Administered 2021-09-13: 45 ug/kg/min via INTRAVENOUS
  Filled 2021-09-12 (×4): qty 100

## 2021-09-12 MED ORDER — ROCURONIUM BROMIDE 10 MG/ML (PF) SYRINGE
PREFILLED_SYRINGE | INTRAVENOUS | Status: AC
  Administered 2021-09-12: 50 mg via INTRAVENOUS
  Filled 2021-09-12: qty 10

## 2021-09-12 MED ORDER — FENTANYL CITRATE (PF) 100 MCG/2ML IJ SOLN
INTRAMUSCULAR | Status: AC
  Administered 2021-09-12: 100 ug via INTRAVENOUS
  Filled 2021-09-12: qty 2

## 2021-09-12 MED ORDER — CHLORHEXIDINE GLUCONATE 0.12% ORAL RINSE (MEDLINE KIT)
15.0000 mL | Freq: Two times a day (BID) | OROMUCOSAL | Status: DC
  Administered 2021-09-12 – 2021-09-13 (×2): 15 mL via OROMUCOSAL

## 2021-09-12 MED ORDER — POTASSIUM PHOSPHATES 15 MMOLE/5ML IV SOLN
15.0000 mmol | Freq: Once | INTRAVENOUS | Status: AC
  Administered 2021-09-12: 15 mmol via INTRAVENOUS
  Filled 2021-09-12: qty 5

## 2021-09-12 MED ORDER — SODIUM CHLORIDE 0.9 % IV SOLN: INTRAVENOUS | Status: DC | PRN

## 2021-09-12 MED ORDER — DIAZEPAM 5 MG/ML IJ SOLN
5.0000 mg | Freq: Once | INTRAMUSCULAR | Status: AC
  Administered 2021-09-12: 5 mg via INTRAVENOUS
  Filled 2021-09-12: qty 2

## 2021-09-12 MED ORDER — FENTANYL 2500MCG IN NS 250ML (10MCG/ML) PREMIX INFUSION
50.0000 ug/h | INTRAVENOUS | Status: DC
  Administered 2021-09-12: 200 ug/h via INTRAVENOUS
  Administered 2021-09-12: 50 ug/h via INTRAVENOUS
  Administered 2021-09-13: 125 ug/h via INTRAVENOUS
  Filled 2021-09-12 (×3): qty 250

## 2021-09-12 MED ORDER — FENTANYL BOLUS VIA INFUSION
50.0000 ug | INTRAVENOUS | Status: DC | PRN
  Administered 2021-09-13: 75 ug via INTRAVENOUS
  Filled 2021-09-12: qty 100

## 2021-09-12 SURGICAL SUPPLY — 38 items
BLADE SAGITTAL WIDE XTHICK NO (BLADE) ×2 IMPLANT
BNDG COHESIVE 4X5 TAN ST LF (GAUZE/BANDAGES/DRESSINGS) ×2 IMPLANT
BNDG ELASTIC 6X5.8 VLCR NS LF (GAUZE/BANDAGES/DRESSINGS) ×2 IMPLANT
BNDG GAUZE ELAST 4 BULKY (GAUZE/BANDAGES/DRESSINGS) ×4 IMPLANT
BRUSH SCRUB EZ  4% CHG (MISCELLANEOUS) ×1
BRUSH SCRUB EZ 4% CHG (MISCELLANEOUS) ×1 IMPLANT
CHLORAPREP W/TINT 26 (MISCELLANEOUS) ×2 IMPLANT
DRAPE INCISE IOBAN 66X45 STRL (DRAPES) ×2 IMPLANT
DRAPE INCISE IOBAN 66X60 STRL (DRAPES) ×2 IMPLANT
ELECT CAUTERY BLADE 6.4 (BLADE) ×2 IMPLANT
ELECT REM PT RETURN 9FT ADLT (ELECTROSURGICAL) ×2
ELECTRODE REM PT RTRN 9FT ADLT (ELECTROSURGICAL) ×1 IMPLANT
GAUZE 4X4 16PLY ~~LOC~~+RFID DBL (SPONGE) ×2 IMPLANT
GAUZE XEROFORM 1X8 LF (GAUZE/BANDAGES/DRESSINGS) ×4 IMPLANT
GLOVE SURG ENC MOIS LTX SZ7 (GLOVE) ×2 IMPLANT
GLOVE SURG SYN 7.0 (GLOVE) ×2 IMPLANT
GLOVE SURG UNDER LTX SZ7.5 (GLOVE) ×2 IMPLANT
GOWN STRL REUS W/ TWL LRG LVL3 (GOWN DISPOSABLE) ×1 IMPLANT
GOWN STRL REUS W/ TWL XL LVL3 (GOWN DISPOSABLE) ×2 IMPLANT
GOWN STRL REUS W/TWL LRG LVL3 (GOWN DISPOSABLE) ×1
GOWN STRL REUS W/TWL XL LVL3 (GOWN DISPOSABLE) ×2
HANDLE YANKAUER SUCT BULB TIP (MISCELLANEOUS) ×2 IMPLANT
KIT TURNOVER KIT A (KITS) ×2 IMPLANT
LABEL OR SOLS (LABEL) ×2 IMPLANT
MANIFOLD NEPTUNE II (INSTRUMENTS) ×2 IMPLANT
NS IRRIG 1000ML POUR BTL (IV SOLUTION) ×2 IMPLANT
PACK EXTREMITY ARMC (MISCELLANEOUS) ×2 IMPLANT
PAD ABD DERMACEA PRESS 5X9 (GAUZE/BANDAGES/DRESSINGS) ×4 IMPLANT
PAD PREP 24X41 OB/GYN DISP (PERSONAL CARE ITEMS) ×2 IMPLANT
SPONGE T-LAP 18X18 ~~LOC~~+RFID (SPONGE) ×4 IMPLANT
STAPLER SKIN PROX 35W (STAPLE) ×2 IMPLANT
STOCKINETTE M/LG 89821 (MISCELLANEOUS) ×2 IMPLANT
SUT SILK 2 0 (SUTURE) ×1
SUT SILK 2 0 SH (SUTURE) ×4 IMPLANT
SUT SILK 2-0 18XBRD TIE 12 (SUTURE) ×1 IMPLANT
SUT VIC AB 0 CT1 36 (SUTURE) ×4 IMPLANT
SUT VIC AB 2-0 CT1 (SUTURE) ×4 IMPLANT
WATER STERILE IRR 500ML POUR (IV SOLUTION) ×2 IMPLANT

## 2021-09-12 SURGICAL SUPPLY — 41 items
APL PRP STRL LF DISP 70% ISPRP (MISCELLANEOUS)
BRUSH SCRUB EZ  4% CHG (MISCELLANEOUS) ×1
BRUSH SCRUB EZ 4% CHG (MISCELLANEOUS) ×1 IMPLANT
CANISTER WOUND CARE 500ML ATS (WOUND CARE) ×1 IMPLANT
CHLORAPREP W/TINT 26 (MISCELLANEOUS) ×1 IMPLANT
DRAPE INCISE IOBAN 66X45 STRL (DRAPES) ×2 IMPLANT
DRSG VAC ATS MED SENSATRAC (GAUZE/BANDAGES/DRESSINGS) ×1 IMPLANT
ELECT CAUTERY BLADE 6.4 (BLADE) ×2 IMPLANT
ELECT REM PT RETURN 9FT ADLT (ELECTROSURGICAL) ×2
ELECTRODE REM PT RTRN 9FT ADLT (ELECTROSURGICAL) ×1 IMPLANT
GAUZE 4X4 16PLY ~~LOC~~+RFID DBL (SPONGE) ×1 IMPLANT
GLOVE SURG ENC MOIS LTX SZ7 (GLOVE) ×2 IMPLANT
GLOVE SURG SYN 7.0 (GLOVE) ×2 IMPLANT
GLOVE SURG SYN 7.0 PF PI (GLOVE) ×1 IMPLANT
GOWN STRL REUS W/ TWL LRG LVL3 (GOWN DISPOSABLE) ×1 IMPLANT
GOWN STRL REUS W/ TWL XL LVL3 (GOWN DISPOSABLE) ×2 IMPLANT
GOWN STRL REUS W/TWL LRG LVL3 (GOWN DISPOSABLE) ×2
GOWN STRL REUS W/TWL XL LVL3 (GOWN DISPOSABLE) ×4
IV NS 1000ML (IV SOLUTION) ×2
IV NS 1000ML BAXH (IV SOLUTION) ×1 IMPLANT
KIT TURNOVER KIT A (KITS) ×2 IMPLANT
LABEL OR SOLS (LABEL) ×2 IMPLANT
MANIFOLD NEPTUNE II (INSTRUMENTS) ×2 IMPLANT
NS IRRIG 500ML POUR BTL (IV SOLUTION) ×2 IMPLANT
PACK EXTREMITY ARMC (MISCELLANEOUS) ×2 IMPLANT
PAD PREP 24X41 OB/GYN DISP (PERSONAL CARE ITEMS) ×2 IMPLANT
PULSAVAC PLUS IRRIG FAN TIP (DISPOSABLE)
SOL PREP PVP 2OZ (MISCELLANEOUS) ×2
SOLUTION PREP PVP 2OZ (MISCELLANEOUS) ×1 IMPLANT
SPONGE T-LAP 18X18 ~~LOC~~+RFID (SPONGE) ×3 IMPLANT
SUT ETHILON 4-0 (SUTURE)
SUT ETHILON 4-0 FS2 18XMFL BLK (SUTURE)
SUT SILK 2 0 (SUTURE) ×2
SUT SILK 2-0 18XBRD TIE 12 (SUTURE) IMPLANT
SUT VIC AB 3-0 SH 27 (SUTURE)
SUT VIC AB 3-0 SH 27X BRD (SUTURE) IMPLANT
SUTURE ETHLN 4-0 FS2 18XMF BLK (SUTURE) IMPLANT
SWAB CULTURE AMIES ANAERIB BLU (MISCELLANEOUS) ×1 IMPLANT
SYR BULB IRRIG 60ML STRL (SYRINGE) ×2 IMPLANT
TIP FAN IRRIG PULSAVAC PLUS (DISPOSABLE) ×1 IMPLANT
WATER STERILE IRR 500ML POUR (IV SOLUTION) ×1 IMPLANT

## 2021-09-12 NOTE — Anesthesia Postprocedure Evaluation (Signed)
Anesthesia Post Note  Patient: [name]  Procedure(s) Performed: INCISION AND DRAINAGE (Right)  Patient location during evaluation: SICU Anesthesia Type: General Level of consciousness: sedated Pain management: pain level controlled Vital Signs Assessment: post-procedure vital signs reviewed and stable Respiratory status: patient remains intubated per anesthesia plan Cardiovascular status: stable Postop Assessment: no apparent nausea or vomiting Anesthetic complications: no   No notable events documented.   Last Vitals:  Vitals:   09/06/2021 1200 09/08/2021 1300  BP: 114/71 114/68  Pulse: 85 86  Resp: (!) 23 (!) 22  Temp: 37 C   SpO2: 99% 100%    Last Pain:  Vitals:   09/14/2021 1200  TempSrc: Oral  PainSc:                  [name]

## 2021-09-12 NOTE — Progress Notes (Signed)
Spoke with Pt's sister over the phone and confirmed to me that she  is coming in tomorrow to see patient before withdrawing care. She does not want any escalation in care, and would not want CPR if his heart stops.

## 2021-09-12 NOTE — Transfer of Care (Signed)
Immediate Anesthesia Transfer of Care Note  Patient: [name]  Procedure(s) Performed: INCISION AND DRAINAGE (Right)  Patient Location: PACU and ICU  Anesthesia Type:General  Level of Consciousness: Patient remains intubated per anesthesia plan  Airway & Oxygen Therapy: Patient placed on Ventilator (see vital sign flow sheet for setting)  Post-op Assessment: Report given to RN  Post vital signs: stable  Last Vitals:  Vitals Value Taken Time  BP    Temp    Pulse    Resp    SpO2      Last Pain:  Vitals:   09/27/2021 1200  TempSrc: Oral  PainSc:       Patients Stated Pain Goal: 0 (16/83/72 9021)  Complications: No notable events documented.

## 2021-09-12 NOTE — Consult Note (Signed)
PHARMACY CONSULT NOTE - FOLLOW UP  Pharmacy Consult for Electrolyte Monitoring and Replacement   Recent Labs: Potassium (mmol/L)  Date Value  09/14/2021 3.6   Magnesium (mg/dL)  Date Value  09/08/2021 1.7   Calcium (mg/dL)  Date Value  09/10/2021 10.1   Albumin (g/dL)  Date Value  09/11/2021 2.5 (L)   Phosphorus (mg/dL)  Date Value  09/28/2021 1.7 (L)   Sodium (mmol/L)  Date Value  09/23/2021 143     Assessment: 55 year old male with PMH of HTN, DM, and HLD who was admitted to the hospital for right foot pain. Patient subsequently underwent right lower leg amputation.  On 12/14 patient was noted to be hyperkalemic with an undecidable phosphorous and pharmacy was consulted for electrolyte monitoring and replacement.   Patient has received 40mg  IV furosemide 12/13 and 12/14  Labs Na 136>137>139>142>143 K 3.5>3.6>2.8>3.2>3.6 Ca 13.2>13.0>11.2>11.2>10.1 Phos 1.8>2.1> <1.0> <1.0>1.7 Mg 1.8>2.1>1.8>1.8>1.7  MIVF: NS 140mL/hr 12/12>12/14  Goal of Therapy:  Electrolytes within normal limits   Plan:  --Will repeat order for 65mmol Kphos  --Will recheck phos 12/15 1600 --No other electrolyte replacement is currently indicated  --Will continue to monitor and replace as clinically indicated   Narda Rutherford, PharmD Pharmacy Resident  09/18/2021 7:36 AM

## 2021-09-12 NOTE — Op Note (Signed)
° ° °  OPERATIVE NOTE   PROCEDURE: Irrigation and debridement of skin, soft tissue, and muscle for approximately 50 cm with multiple devitalized muscle fragments removed as well as drainage of significant purulent material with placement of a negative pressure dressing  PRE-OPERATIVE DIAGNOSIS: Nonviable tissue and infection of right leg with previous guillotine amputation of right lower leg  POST-OPERATIVE DIAGNOSIS: Same as above  SURGEON: Leotis Pain, MD  ASSISTANT(S): Marcelle Overlie, PA-C  ANESTHESIA: General  ESTIMATED BLOOD LOSS: 10 cc  FINDING(S): None  SPECIMEN(S): None  INDICATIONS:   [name] is a 55 y.o. male who presents with gangrene of the right foot with sepsis.  He had a guillotine amputation 3 days prior but continues to and is not yet ready for his formal amputation.  He is brought back to the operating room for washout and removal of nonviable tissue. An assistant was present during the procedure to help facilitate the exposure and expedite the procedure.   DESCRIPTION: After obtaining full informed written consent, the patient was brought back to the operating room and placed supine upon the operating table.  The patient received IV antibiotics prior to induction. The assistant provided retraction and mobilization to help facilitate exposure and expedite the procedure throughout the entire procedure.  This included following suture, using retractors, and optimizing lighting.  After obtaining adequate anesthesia, the patient was prepped and draped in the standard fashion.  The wound was already opened there was clear purulent material emanating both from the anterior compartment as well as posterior and laterally although a lesser degree posteriorly and laterally.  And excisional debridement was performed to skin, soft tissue, and a large amount of devitalized muscle particular from the anterior compartment to remove all clearly non-viable tissue.  The tissue was  taken back to bleeding tissue that appeared viable.  The debridement was performed with electrocautery and scissors and encompassed an area of approximately 50 cm2.  The wound was irrigated copiously with saline.  After all clearly non-viable tissue was removed, and there was no further purulent material identified, negative pressure dressing was cut to fit the wound.  Strips of [place] reduced and occlusive seal was obtained. The patient was then awakened from anesthesia and taken to the recovery room in stable condition having tolerated the procedure well.  COMPLICATIONS: none  CONDITION: stable  Leotis Pain  08/29/2021, 2:02 PM   This note was created with Dragon Medical transcription system. Any errors in dictation are purely unintentional.

## 2021-09-12 NOTE — Procedures (Signed)
Arterial Catheter Insertion Procedure Note  [name]  [id number]  Oct 20, 1965  Date:08/31/2021  Time:5:00 AM    Provider Performing: Karen Kays    Procedure: Insertion of Arterial Line [id number]) without US guidance  Indication(s) Blood pressure monitoring and/or need for frequent ABGs  Consent Unable to obtain consent due to emergent nature of procedure.  Anesthesia None   Time Out Verified patient identification, verified procedure, site/side was marked, verified correct patient position, special equipment/implants available, medications/allergies/relevant history reviewed, required imaging and test results available.   Sterile Technique Maximal sterile technique including full sterile barrier drape, hand hygiene, sterile gown, sterile gloves, mask, hair covering, sterile ultrasound probe cover (if used).   Procedure Description Area of catheter insertion was cleaned with chlorhexidine and draped in sterile fashion. Without real-time ultrasound guidance an arterial catheter was placed into the left radial artery.  Appropriate arterial tracings confirmed on monitor.     Complications/Tolerance None; patient tolerated the procedure well.   EBL Minimal   Specimen(s) None     Frederick Falco, DNP, CCRN, FNP-C, AGACNP-BC Acute Care Nurse Practitioner  [facility] Pager: [phone] [place] [facility]

## 2021-09-12 NOTE — Progress Notes (Signed)
Nutrition Follow-up ° °DOCUMENTATION CODES:  ° °Non-severe (moderate) malnutrition in context of social or environmental circumstances ° °INTERVENTION:  ° °Vital 1.5@60ml/hr + ProSource TF 45ml TID via tube  ° °Free water flushes 30ml q4 hours to maintain tube patency  ° °Regimen provides 2280kcal/day, 130g/day protein and 1280ml/day of free water ° °Pt at high refeed risk; recommend monitor potassium, magnesium and phosphorus labs daily until stable ° °Juven Fruit Punch BID via tube, each serving provides 95kcal and 2.5g of protein (amino acids glutamine and arginine) ° °NUTRITION DIAGNOSIS:  ° °Moderate Malnutrition related to social / environmental circumstances (etoh abuse) as evidenced by moderate fat depletion, severe fat depletion, moderate muscle depletion, severe muscle depletion. ° °GOAL:  ° °Provide needs based on ASPEN/SCCM guidelines °-met with tube feeds  ° °MONITOR:  ° °Vent status, Labs, Weight trends, TF tolerance, Skin, I & O's ° °ASSESSMENT:  ° °55 y.o. male with medical history significant for hypertension, etoh abuse, diabetes mellitus, dyslipidemia and status post TMA of the right foot which was done on 05/14/21 who is admitted with right foot infection, CHF and NSTEMI. 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 Tube feeds on hold for I & D today. Will plan to restart tube feeds after procedure. Pt continues to refeed; electrolytes being replaced. Will adjust needs as pt is now ventilated. No new weight since 12/12; will request daily weights.   ° °Medications reviewed and include: aspirin, cinacalcet, colace, lovenox, folic acid, insulin, MVI, juven, protonix, miralax, aldactone, thiamine, LRS @75ml/hr, phenylephrine, zosyn, KPhos ° °Labs reviewed: K 3.6 wnl, BUN 28(H), P 1.7(L), Mg 1.7 wnl °Wbc- 14.0(H), Hgb 10.5(L), Hct 32.1(L) °Cbgs- 115, 133, 210 x 24 hrs ° °Patient is currently intubated on ventilator support °MV: 10.9 L/min °Temp (24hrs), Avg:99.2 °F  (37.3 °C), Min:98.6 °F (37 °C), Max:99.9 °F (37.7 °C) ° °Propofol: none  ° °MAP- >65mmHg  ° °UOP- 2450ml  ° °Diet Order:   °Diet Order   ° °       °  Diet NPO time specified  Diet effective midnight       °  ° °  °  ° °  ° °EDUCATION NEEDS:  ° °No education needs have been identified at this time ° °Skin:  Skin Assessment: Reviewed RN Assessment (Incision R foot s/p BKA), VAC, ecchymosis  ° °Last BM:  12/15- per RN ° °Height:  ° °Ht Readings from Last 1 Encounters:  °09/17/2021 6' 3" (1.905 m)  ° ° °Weight:  ° °Wt Readings from Last 1 Encounters:  °09/24/2021 94.8 kg  ° ° °Ideal Body Weight:  83.3 kg (adjusted for rt BKA) ° °BMI:  Body mass index is 26.12 kg/m². ° °Estimated Nutritional Needs:  ° °Kcal:  2219kcal/day ° °Protein:  >130g/day ° °Fluid:  2.2-2.5L/day ° °Casey Campbell MS, RD, LDN °Please refer to [facility] for RD and/or RD on-call/weekend/after hours pager ° °

## 2021-09-12 NOTE — Progress Notes (Addendum)
Consultation Note Date: 09/17/2021   Patient Name: [name]  DOB: [birth year]  MRN: [id number]  Age / Sex: 55 y.o., male  PCP: Pcp, No Referring Physician: Flora Lipps, MD  Reason for Consultation: Establishing goals of care  HPI/Patient Profile: [name] is a 55 y.o. 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  Clinical Assessment and Goals of Care: Sister called to discuss patient's care. She states she was HPOA for their mother and has had to make difficult decisions in the past. She tells me that she has tried to help him as she could and encouraged him to stop drinking. She states he has been aware of what the alcohol was doing to his body, but it did not motivate him to stop. She states she feels he is finished fighting and is ready to go.   She inquires about withdrawing care and allowing a peaceful and natural death. She states she does want to come and say good-bye, and can come tomorrow around noon. She states between now and then, she  does not want any escalation in care, and if his heart stops, she does not want CPR. She understands he would be kept comfortable until death. Conversation was witnessed and confirmed by primary Banker. Staff updated.     SUMMARY OF RECOMMENDATIONS   Sister is coming in tomorrow to see patient before withdrawing care. She does not want any escalation in care, and would not want CPR if his heart stops.  Anticipate hospital death.      Primary Diagnoses: Present on Admission:  Sepsis ([place])  Diabetes mellitus with hyperglycemia ([place])  NSTEMI (non-ST elevated myocardial infarction) ([place])  Nicotine dependence  Alcohol dependence ([place])  Diabetic foot  ulcer ([place])  Hypercalcemia  Hyponatremia   I have reviewed the medical record, interviewed the patient and family, and examined the patient. The following aspects are pertinent.  Past Medical History:  Diagnosis Date   Diabetes ([place])    Hypertension    Social History   Socioeconomic History   Marital status: Single    Spouse name: Not on file   Number of children: Not on file   Years of education: Not on file   Highest education level: Not on file  Occupational History   Not on file  Tobacco Use   Smoking status: Light Smoker    Types: Cigars   Smokeless tobacco: Never  Vaping Use   Vaping Use: Never used  Substance and Sexual Activity   Alcohol use: Yes    Comment: "too much"    Drug use: Never   Sexual activity: Yes  Other Topics Concern   Not on file  Social History Narrative   Not on file   Social Determinants of Health   Financial Resource Strain: Not on file  Food Insecurity: Not on file  Transportation Needs: Not on  file  Physical Activity: Not on file  Stress: Not on file  Social Connections: Not on file   Family History  Problem Relation Age of Onset   CAD Father    CAD Brother    Scheduled Meds:  [MAR Hold] aspirin  81 mg Per Tube Daily   [MAR Hold] atorvastatin  40 mg Per Tube QPM   [MAR Hold] chlorhexidine gluconate (MEDLINE KIT)  15 mL Mouth Rinse BID   [MAR Hold] Chlorhexidine Gluconate Cloth  6 each Topical Daily   [MAR Hold] cinacalcet  30 mg Oral Q supper   [MAR Hold] diazepam  5 mg Per NG tube Q8H   [MAR Hold] docusate  100 mg Per Tube BID   [MAR Hold] enoxaparin (LOVENOX) injection  40 mg Subcutaneous Q24H   [MAR Hold] feeding supplement (PROSource TF)  45 mL Per Tube BID   [MAR Hold] folic acid  1 mg Per Tube Daily   [MAR Hold] free water  30 mL Per Tube Q4H   [MAR Hold] insulin aspart  0-15 Units Subcutaneous Q4H   [MAR Hold] insulin aspart  2 Units Subcutaneous Q4H   [MAR Hold] mouth rinse  15 mL Mouth Rinse 10 times per day    [facility] Hold] metoprolol tartrate  25 mg Per Tube BID   [MAR Hold] multivitamin with minerals  1 tablet Per Tube Daily   [MAR Hold] nutrition supplement (JUVEN)  1 packet Per Tube BID BM   [MAR Hold] pantoprazole (PROTONIX) IV  40 mg Intravenous Q24H   [MAR Hold] polyethylene glycol  17 g Per Tube Daily   [MAR Hold] sodium chloride flush  10-40 mL Intracatheter Q12H   [MAR Hold] spironolactone  25 mg Per Tube Daily   [MAR Hold] thiamine injection  100 mg Intravenous Daily   Continuous Infusions:  sodium chloride     [MAR Hold] dexmedetomidine (PRECEDEX) IV infusion 1.2 mcg/kg/hr (09/17/2021 1316)   feeding supplement (OSMOLITE 1.5 CAL) Stopped (09/01/2021 0035)   fentaNYL infusion INTRAVENOUS 150 mcg/hr (09/03/2021 1316)   lactated ringers 75 mL/hr at 08/30/2021 1307   [MAR Hold] phenylephrine (NEO-SYNEPHRINE) Adult infusion 50 mcg/min (09/18/2021 1316)   [MAR Hold] piperacillin-tazobactam (ZOSYN)  IV Stopped (09/06/2021 0923)   potassium PHOSPHATE IVPB (in mmol) 43 mL/hr at 09/26/2021 1200   [MAR Hold] thiamine injection Stopped (09/08/2021 1027)   PRN Meds:.[MAR Hold] acetaminophen, [MAR Hold] acetaminophen, [MAR Hold] fentaNYL, [MAR Hold]  morphine injection, [MAR Hold] ondansetron **OR** [MAR Hold] ondansetron (ZOFRAN) IV, [MAR Hold] oxyCODONE, [MAR Hold] sodium chloride flush Medications Prior to Admission:  Prior to Admission medications   Medication Sig Start Date End Date Taking? Authorizing Provider  acetaminophen (TYLENOL) 325 MG tablet Take 2 tablets (650 mg total) by mouth every 6 (six) hours as needed for mild pain (or Fever >/= 101). 05/22/21  Yes Arrien, Jimmy Picket, MD  oxyCODONE (OXY IR/ROXICODONE) 5 MG immediate release tablet Take 1 tablet (5 mg total) by mouth every 6 (six) hours as needed for moderate pain. 05/22/21  Yes Arrien, Jimmy Picket, MD  amoxicillin-clavulanate (AUGMENTIN) 875-125 MG tablet Take 1 tablet by mouth 2 (two) times daily  for 14 days. Patient not taking:  Reported on 09/23/2021 06/13/21   Tsosie Billing, MD  atorvastatin (LIPITOR) 40 MG tablet Take 40 mg by mouth daily.    [provider]  glipiZIDE (GLUCOTROL XL) 10 MG 24 hr tablet Take 20 mg by mouth daily with breakfast. Patient not taking: Reported on 09/16/2021    [provider]   No Known Allergies Review of Systems  Unable to perform ROS  Physical Exam Constitutional:      Comments: Eyes closed. On ventilator.     Vital Signs: BP 114/68    Pulse 86    Temp 98.6 F (37 C) (Oral)    Resp (!) 22    Ht '6\' 3"'  (1.905 m)    Wt 94.8 kg    SpO2 100%    BMI 26.12 kg/m  Pain Scale: CPOT   Pain Score: 0-No pain   SpO2: SpO2: 100 % O2 Device:SpO2: 100 % O2 Flow Rate: .O2 Flow Rate (L/min): 2 L/min  IO: Intake/output summary:  Intake/Output Summary (Last 24 hours) at 09/01/2021 1331 Last data filed at 09/04/2021 1200 Gross per 24 hour  Intake 3543.9 ml  Output 1650 ml  Net 1893.9 ml    LBM: Last BM Date:  (PTA) Baseline Weight: Weight: 93 kg Most recent weight: Weight: 94.8 kg      Time In: 1:05 Time Out: 1:25 Time Total: 20 min Greater than 50%  of this time was spent counseling and coordinating care related to the above assessment and plan.  Signed by: Asencion Gowda, NP   Please contact Palliative Medicine Team phone at [phone] for questions and concerns.  For individual provider: See Shea Evans

## 2021-09-12 NOTE — Progress Notes (Signed)
Date of Admission:  09/24/2021     ID: [name] is a 55 y.o. male Principal Problem:   Sepsis ([place]) Active Problems:   Diabetes mellitus with hyperglycemia ([place])   Hypercalcemia   NSTEMI (non-ST elevated myocardial infarction) ([place])   Nicotine dependence   Alcohol dependence ([place])   Diabetic foot ulcer ([facility])   Hyponatremia   Malnutrition of moderate degree   HFrEF (heart failure with reduced ejection fraction) ([facility])    Subjective: Intubated, on pressor Went for further cleaning of rt Guillotine site  Medications:   [MAR Hold] aspirin  81 mg Per Tube Daily   [MAR Hold] atorvastatin  40 mg Per Tube QPM   [MAR Hold] chlorhexidine gluconate (MEDLINE KIT)  15 mL Mouth Rinse BID   [MAR Hold] Chlorhexidine Gluconate Cloth  6 each Topical Daily   [MAR Hold] cinacalcet  30 mg Oral Q supper   [MAR Hold] diazepam  5 mg Per NG tube Q8H   [MAR Hold] docusate  100 mg Per Tube BID   [MAR Hold] enoxaparin (LOVENOX) injection  40 mg Subcutaneous Q24H   feeding supplement (PROSource TF)  45 mL Per Tube TID   [MAR Hold] folic acid  1 mg Per Tube Daily   [MAR Hold] free water  30 mL Per Tube Q4H   [MAR Hold] insulin aspart  0-15 Units Subcutaneous Q4H   [MAR Hold] insulin aspart  2 Units Subcutaneous Q4H   [MAR Hold] mouth rinse  15 mL Mouth Rinse 10 times per day   [MAR Hold] metoprolol tartrate  25 mg Per Tube BID   [MAR Hold] multivitamin with minerals  1 tablet Per Tube Daily   [MAR Hold] nutrition supplement (JUVEN)  1 packet Per Tube BID BM   [MAR Hold] pantoprazole (PROTONIX) IV  40 mg Intravenous Q24H   [MAR Hold] polyethylene glycol  17 g Per Tube Daily   [MAR Hold] sodium chloride flush  10-40 mL Intracatheter Q12H   [MAR Hold] spironolactone  25 mg Per Tube Daily   [MAR Hold] thiamine injection  100 mg Intravenous Daily    Objective: Vital signs in last 24 hours: Temp:  [98.6 F (37 C)-99.9 F (37.7 C)] 98.6 F (37 C) (12/15 1200) Pulse Rate:  [55-107] 86 (12/15  1300) Resp:  [20-34] 22 (12/15 1300) BP: (76-121)/(54-73) 114/68 (12/15 1300) SpO2:  [96 %-100 %] 100 % (12/15 1300) Arterial Line BP: (82-122)/(54-77) 82/72 (12/15 1300) FiO2 (%):  [40 %] 40 % (12/15 1114)  PHYSICAL EXAM:  General: intubated and sedated , on pressor Lungs: Bilateral air entry Heart: Tachycardia abdomen: Soft, non-tender,not distended. Bowel sounds normal. No masses Extremities: Right leg guillotine amputation now has a wound VAC.  Skin: No rashes or lesions. Or bruising Lymph: Cervical, supraclavicular normal. Neurologic: Grossly non-focal  Lab Results Recent Labs    09/10/21 0402 09/11/21 0341 09/11/21 1144 09/16/2021 0505 09/21/2021 0512  WBC 7.2  --   --  14.0*  --   HGB 12.3*  --   --  10.5*  --   HCT 36.5*  --   --  32.1*  --   NA 137   < > 142  --  143  K 3.6   < > 3.2*  --  3.6  CL 103   < > 107  --  108  CO2 31   < > 27  --  31  BUN 19   < > 24*  --  28*  CREATININE 0.82   < >  0.78  --  0.81   < > = values in this interval not displayed.   Liver Panel Recent Labs    09/10/21 0402 09/11/21 1144  PROT 5.7*  --   ALBUMIN 1.8* 2.5*  AST 39  --   ALT 19  --   ALKPHOS 78  --   BILITOT 1.3*  --     Microbiology: 08/29/2021- BC NG 12/10 rt foot non surgical culture-MSSA Studies/Results: DG Chest 1 View  Result Date: 09/03/2021 CLINICAL DATA:  Status post cardiac arrest.  Intubation film. EXAM: CHEST  1 VIEW COMPARISON:  Portable chest 09/10/2021. FINDINGS: 4:16 a.m., 09/14/2021. The patient is now intubated. The tip tube is 6.3 cm from the carina. NGT enters the stomach with the intragastric course not seen. The heart is enlarged. There are increasing septal lines from the level of the aortic arch down consistent with development of moderate interstitial edema. There are worsening perihilar alveolar opacities most likely due to alveolar edema, underlying pneumonia also a possibility. There is increased vascular engorgement centrally, with small  pleural effusions beginning to form. IMPRESSION: 1. Cardiomegaly with CHF and worsening moderate interstitial edema. 2. Perihilar opacities most likely alveolar edema, pneumonia not strictly excluded. 3. Small pleural effusions. Electronically Signed   By: Telford Nab M.D.   On: 09/14/2021 04:26   [place] EKG SITE RITE  Result Date: 09/11/2021 If Site Rite image not attached, placement could not be confirmed due to current cardiac rhythm.    Assessment/Plan: ?Encephalopathy-likely combination of infection/ hypercalcemia.  Need to rule out DTs as according to his girlfriend he drinks a lot of RUM. On thiamine  Acute hypozic resp failure- intubated  Sepsis secondary to the rt foot infection S/p Guillotine amputation rt foot No surgical culture sent Patient awaiting AKA Prior culture from the wound has staph aureus and anaerobes. We will discontinue vancomycin.  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

## 2021-09-12 NOTE — Consult Note (Addendum)
Consultation Note Date: 09/22/2021   Patient Name: [name]  DOB: May 24, 1966  MRN: [id number]  Age / Sex: 55 y.o., male  PCP: Pcp, No Referring Physician: Flora Lipps, MD  Reason for Consultation: Establishing goals of care  HPI/Patient Profile: [name] is a 55 y.o. 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  Clinical Assessment and Goals of Care: Patient is on ventilator. He is diaphoretic. He is currently on Precedex drip for ETOH withdrawal. Nursing staff is at bedside and states the patient's sister did call back yesterday for update, and to say that she may be unavailable, and to call her husband for any needs.   Attempted unsuccessfully to call sister. Called her husband. He states she is a 2nd grade school teacher and cannot answer when called. He tells me his wife and patient talk or text at least monthly, and he visits on holidays. He states the last they texted was on Dec 3rd, and he only gave simple responses.   He states both parents have died; there were 7 siblings but [name] and [name] are the only 2 remaining, that the others died from traumatic injury, or cardiac death, which occurs around 55 years old. He tells me patient has no other family, that there is no HPOA, and that she is [name] by default.   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  He states his wife has spoken with him many times about ETOH cessation, and he would deny a problem. [name] had his toe amputated a few years ago and was told it was due to his ETOH use and DM,  but it did not change his habits.   [name] states they have been updated, and articulates accurately [name]'s current status and care up to this point. He discusses not doing the amputation today, but is worried about the overall picture, and the future in any scenario. He tells me his wife had initially decided she would not want CPR or life support for [name], but when confronted with the decision, felt differently. He states they are discussing care moving forward and limits on care. Discussed determining a time to speak with her regarding care moving forward. He states he will speak with her to find out if she has a planning period, or time during the day that she can speak.     SUMMARY OF RECOMMENDATIONS   Husband to speak with wife about times she is available as she is a 2nd grade school teacher and cannot talk during the day.      Primary Diagnoses: Present on Admission:  Sepsis ([place])  Diabetes mellitus with hyperglycemia ([place])  NSTEMI (non-ST elevated myocardial infarction) ([place])  Nicotine dependence  Alcohol dependence ([name])  Diabetic foot ulcer ([place])  Hypercalcemia  Hyponatremia   I have reviewed the medical record, interviewed the patient and family, and examined the patient. The following aspects are pertinent.  Past Medical History:  Diagnosis Date   Diabetes ([place])    Hypertension    Social History   Socioeconomic History   Marital status: Single    Spouse name: Not on file   Number of children: Not on file   Years of education: Not on file   Highest education level: Not on file  Occupational History   Not on file  Tobacco Use   Smoking status: Light Smoker    Types: Cigars   Smokeless tobacco: Never  Vaping Use   Vaping Use: Never used  Substance and Sexual Activity   Alcohol use: Yes    Comment: "too much"    Drug use: Never   Sexual activity: Yes  Other Topics Concern   Not on file  Social History Narrative   Not on file   Social Determinants of Health    Financial Resource Strain: Not on file  Food Insecurity: Not on file  Transportation Needs: Not on file  Physical Activity: Not on file  Stress: Not on file  Social Connections: Not on file   Family History  Problem Relation Age of Onset   CAD Father    CAD Brother    Scheduled Meds:  aspirin EC  81 mg Oral Daily   atorvastatin  40 mg Per Tube QPM   chlorhexidine  15 mL Mouth Rinse BID   Chlorhexidine Gluconate Cloth  6 each Topical Daily   cinacalcet  30 mg Oral Q supper   diazepam  5 mg Per NG tube Q8H   docusate  100 mg Per Tube BID   enoxaparin (LOVENOX) injection  40 mg Subcutaneous Q24H   feeding supplement (PROSource TF)  45 mL Per Tube BID   folic acid  1 mg Per Tube Daily   free water  30 mL Per Tube Q4H   insulin aspart  0-15 Units Subcutaneous Q4H   insulin aspart  2 Units Subcutaneous Q4H   mouth rinse  15 mL Mouth Rinse q12n4p   metoprolol tartrate  25 mg Per Tube BID   multivitamin with minerals  1 tablet Per Tube Daily   nutrition supplement (JUVEN)  1 packet Per Tube BID BM   pantoprazole (PROTONIX) IV  40 mg Intravenous Q24H   polyethylene glycol  17 g Per Tube Daily   sodium chloride flush  10-40 mL Intracatheter Q12H   spironolactone  25 mg Per Tube Daily   [START ON 09/18/2021] thiamine injection  100 mg Intravenous Daily   Continuous Infusions:  sodium chloride     dexmedetomidine (PRECEDEX) IV infusion 1.2 mcg/kg/hr (08/29/2021 1035)   feeding supplement (OSMOLITE 1.5 CAL) Stopped (09/22/2021 0035)   fentaNYL infusion INTRAVENOUS 50 mcg/hr (09/24/2021 0800)   lactated ringers 75 mL/hr at 09/21/2021 0700   phenylephrine (NEO-SYNEPHRINE) Adult infusion 25 mcg/min (09/11/2021 1036)   piperacillin-tazobactam (ZOSYN)  IV 12.5 mL/hr at 09/17/2021 0800   potassium PHOSPHATE IVPB (in mmol) 15 mmol (09/02/2021 1126)   thiamine injection 250 mg (09/27/2021 0957)   PRN Meds:.acetaminophen, acetaminophen, fentaNYL, morphine injection, ondansetron **OR** ondansetron  (ZOFRAN) IV, oxyCODONE, sodium chloride flush Medications Prior to Admission:  Prior to Admission medications   Medication Sig Start Date End Date Taking? Authorizing Provider  acetaminophen (TYLENOL) 325 MG tablet Take 2 tablets (650 mg total) by mouth every 6 (six)  hours as needed for mild pain (or Fever >/= 101). 05/22/21  Yes Arrien, Jimmy Picket, MD  oxyCODONE (OXY IR/ROXICODONE) 5 MG immediate release tablet Take 1 tablet (5 mg total) by mouth every 6 (six) hours as needed for moderate pain. 05/22/21  Yes Arrien, Jimmy Picket, MD  amoxicillin-clavulanate (AUGMENTIN) 875-125 MG tablet Take 1 tablet by mouth 2 (two) times daily  for 14 days. Patient not taking: Reported on 09/20/2021 06/13/21   Tsosie Billing, MD  atorvastatin (LIPITOR) 40 MG tablet Take 40 mg by mouth daily.    [provider]  glipiZIDE (GLUCOTROL XL) 10 MG 24 hr tablet Take 20 mg by mouth daily with breakfast. Patient not taking: Reported on 09/02/2021    [provider]   No Known Allergies Review of Systems  Unable to perform ROS  Physical Exam Constitutional:      Comments: Eyes closed. On ventilator.     Vital Signs: BP 112/69 (BP Location: Left Leg)    Pulse 85    Temp 99.3 F (37.4 C) (Oral)    Resp (!) 21    Ht 6\' 3"  (1.905 m)    Wt 94.8 kg    SpO2 99%    BMI 26.12 kg/m  Pain Scale: CPOT   Pain Score: 0-No pain   SpO2: SpO2: 99 % O2 Device:SpO2: 99 % O2 Flow Rate: .O2 Flow Rate (L/min): 2 L/min  IO: Intake/output summary:  Intake/Output Summary (Last 24 hours) at 09/16/2021 1131 Last data filed at 09/24/2021 0800 Gross per 24 hour  Intake 4039.64 ml  Output 2500 ml  Net 1539.64 ml    LBM: Last BM Date:  (PTA) Baseline Weight: Weight: 93 kg Most recent weight: Weight: 94.8 kg       Time In: 11:00 Time Out: 11:30 Time Total: 30 min Greater than 50%  of this time was spent counseling and coordinating care related to the above assessment and plan.  Signed  by: Asencion Gowda, NP   Please contact Palliative Medicine Team phone at [phone] for questions and concerns.  For individual provider: See She[facility]

## 2021-09-12 NOTE — Procedures (Signed)
Intubation Procedure Note  Davi Kroon  [id number]  Jan 25, 1966  Date:09/27/2021  Time:4:08 AM   Provider Performing:Monty Mccarrell A Bronwyn Belasco    Procedure: Intubation (40973)  Indication(s) Respiratory Failure  Consent Unable to obtain consent due to emergent nature of procedure.   Anesthesia Etomidate, Fentanyl, and Rocuronium   Time Out Verified patient identification, verified procedure, site/side was marked, verified correct patient position, special equipment/implants available, medications/allergies/relevant history reviewed, required imaging and test results available.   Sterile Technique Usual hand hygeine, masks, and gloves were used   Procedure Description Patient positioned in bed supine.  Sedation given as noted above.  Patient was intubated with endotracheal tube using Glidescope.  View was Grade 1 full glottis .  Number of attempts was 1.  Colorimetric CO2 detector was consistent with tracheal placement.   Complications/Tolerance None; patient tolerated the procedure well. Chest X-ray is ordered to verify placement.   EBL Minimal   Specimen(s) None   Rufina Falco, DNP, CCRN, FNP-C, AGACNP-BC Acute Care Nurse Practitioner  [facility] Pager: [phone] [facility] at [facility]

## 2021-09-12 NOTE — Progress Notes (Signed)
[place], [place] 09/06/2021  Subjective:   Hospital day # 5  Patient is currently being monitored in the ICU.  He underwent right below the knee amputation on December 12.    Patient was started on Precedex, became hypotensive and then had to be intubated. This morning FiO2 40% Sedated with fentanyl and Precedex Requiring phenylephrine for pressor support  Objective:  Vital signs in last 24 hours:  Temp:  [98.1 F (36.7 C)-99.9 F (37.7 C)] 99.3 F (37.4 C) (12/15 0800) Pulse Rate:  [55-116] 83 (12/15 0800) Resp:  [17-38] 17 (12/15 0800) BP: (72-135)/(53-91) 95/61 (12/15 0800) SpO2:  [89 %-100 %] 99 % (12/15 0800) Arterial Line BP: (99-108)/(54-77) 99/55 (12/15 0800) FiO2 (%):  [40 %] 40 % (12/15 0800)  Weight change:  Filed Weights   09/08/2021 0648 09/26/2021 1848  Weight: 93 kg 94.8 kg    Intake/Output:    Intake/Output Summary (Last 24 hours) at 09/15/2021 5364 Last data filed at 09/25/2021 0800 Gross per 24 hour  Intake 4039.64 ml  Output 2500 ml  Net 1539.64 ml      Physical Exam: General: Critically ill-appearing  HEENT NG tube in place  Pulm/lungs Ventilator assisted  CVS/Heart S1S2, no rubs or gallops  Abdomen:  Soft,   Extremities: Right leg with dressing.  Right BKA.  With wound VAC  Neurologic: Sedated          Basic Metabolic Panel:  Recent Labs  Lab 09/06/2021 2315 09/10/21 0402 09/11/21 0341 09/11/21 1144 09/11/2021 0512  NA 136 137 139 142 143  K 3.5 3.6 2.8* 3.2* 3.6  CL 104 103 106 107 108  CO2 31 31 30 27 31   GLUCOSE 122* 138* 210* 292* 203*  BUN 20 19 22* 24* 28*  CREATININE 0.87 0.82 0.76 0.78 0.81  CALCIUM 13.2* 13.0* 11.2* 11.2* 10.1  MG 1.8 2.1 1.8 1.8 1.7  PHOS 2.3* 2.1* <1.0* <1.0*   <1.0* 1.7*      CBC: Recent Labs  Lab 09/17/2021 0658 09/08/21 0616 09/04/2021 0259 09/10/21 0402 09/24/2021 0505  WBC 11.2* 12.4* 8.2 7.2 14.0*  NEUTROABS 10.6*  --   --   --   --   HGB 14.4 12.6* 11.9*  12.3* 10.5*  HCT 41.1 36.2* 34.9* 36.5* 32.1*  MCV 88.6 87.7 89.3 92.2 95.5  PLT 185 164 153 151 234      No results found for: HEPBSAG, HEPBSAB, HEPBIGM    Microbiology:  Recent Results (from the past 240 hour(s))  Urine Culture     Status: None   Collection Time: 09/17/2021  6:48 AM   Specimen: Urine, Random  Result Value Ref Range Status   Specimen Description   Final    URINE, RANDOM Performed at [facility], [address] [place], [place] [address]    Special Requests   Final    NONE Performed at [facility], [address], [place], [place] [address]    Culture   Final    NO GROWTH Performed at [facility], [address], [place], [place] [address]    Report Status 09/08/2021 FINAL  Final  Culture, blood (routine x 2)     Status: None (Preliminary result)   Collection Time: 09/08/2021  6:58 AM   Specimen: BLOOD  Result Value Ref Range Status   Specimen Description BLOOD LEFT FA  Final   Special Requests   Final    BOTTLES DRAWN AEROBIC AND ANAEROBIC Blood Culture adequate  volume   Culture  Setup Time   Final    GRAM POSITIVE RODS ANAEROBIC BOTTLE ONLY CRITICAL RESULT CALLED TO, READ BACK BY AND VERIFIED WITH: NATHAN BLUE@[email or web address]  09/10/21 RH Performed at [facility], [address] [place], [place] [address]    Culture GRAM POSITIVE RODS  Final   Report Status PENDING  Incomplete  Culture, blood (routine x 2)     Status: None   Collection Time: 09/16/2021  6:59 AM   Specimen: BLOOD  Result Value Ref Range Status   Specimen Description BLOOD RIGHT FA  Final   Special Requests   Final    BOTTLES DRAWN AEROBIC AND ANAEROBIC Blood Culture adequate volume   Culture   Final    NO GROWTH 5 DAYS Performed at [facility], [address] [place], [place] [address]    Report Status 09/22/2021 FINAL  Final  Resp Panel by RT-PCR (Flu A&B, Covid) Nasopharyngeal Swab     Status: None   Collection Time: 09/11/2021  7:44 AM    Specimen: Nasopharyngeal Swab; Nasopharyngeal(NP) swabs in vial transport medium  Result Value Ref Range Status   SARS Coronavirus 2 by RT PCR NEGATIVE NEGATIVE Final    Comment: (NOTE) SARS-CoV-2 target nucleic acids are NOT DETECTED.  The SARS-CoV-2 RNA is generally detectable in upper respiratory specimens during the acute phase of infection. The lowest concentration of SARS-CoV-2 viral copies this assay can detect is 138 copies/mL. A negative result does not preclude SARS-Cov-2 infection and should not be used as the sole basis for treatment or other patient management decisions. A negative result may occur with  improper specimen collection/handling, submission of specimen other than nasopharyngeal swab, presence of viral mutation(s) within the areas targeted by this assay, and inadequate number of viral copies(<138 copies/mL). A negative result must be combined with clinical observations, patient history, and epidemiological information. The expected result is Negative.  Fact Sheet for Patients:  EntrepreneurPulse.com.au  Fact Sheet for Healthcare Providers:  IncredibleEmployment.be  This test is no t yet approved or cleared by the [place] FDA and  has been authorized for detection and/or diagnosis of SARS-CoV-2 by FDA under an Emergency Use Authorization (EUA). This EUA will remain  in effect (meaning this test can be used) for the duration of the COVID-19 declaration under Section 564(b)(1) of the Act, 21 U.S.C.section 360bbb-3(b)(1), unless the authorization is terminated  or revoked sooner.       Influenza A by PCR NEGATIVE NEGATIVE Final   Influenza B by PCR NEGATIVE NEGATIVE Final    Comment: (NOTE) The Xpert Xpress SARS-CoV-2/FLU/RSV plus assay is intended as an aid in the diagnosis of influenza from Nasopharyngeal swab specimens and should not be used as a sole basis for treatment. Nasal washings and aspirates are  unacceptable for Xpert Xpress SARS-CoV-2/FLU/RSV testing.  Fact Sheet for Patients: EntrepreneurPulse.com.au  Fact Sheet for Healthcare Providers: IncredibleEmployment.be  This test is not yet approved or cleared by the [place] FDA and has been authorized for detection and/or diagnosis of SARS-CoV-2 by FDA under an Emergency Use Authorization (EUA). This EUA will remain in effect (meaning this test can be used) for the duration of the COVID-19 declaration under Section 564(b)(1) of the Act, 21 U.S.C. section 360bbb-3(b)(1), unless the authorization is terminated or revoked.  Performed at [facility], [address] [place], [place] [address]   Aerobic/Anaerobic Culture w Gram Stain (surgical/deep wound)     Status: None (Preliminary result)   Collection Time: 09/03/2021  8:16 AM   Specimen: Wound  Result Value Ref Range Status   Specimen Description   Final    WOUND Performed at [facility], [address] [place], [place] [address]    Special Requests RIGHT FOOT  Final   Gram Stain   Final    MODERATE WBC PRESENT, PREDOMINANTLY MONONUCLEAR MODERATE GRAM POSITIVE COCCI IN PAIRS AND CHAINS MODERATE GRAM NEGATIVE RODS    Culture   Final    RARE STAPHYLOCOCCUS AUREUS WITHIN MIXED ORGANISMS NO ANAEROBES ISOLATED; CULTURE IN PROGRESS FOR 5 DAYS CORRECTED ON 12/14 AT 1413: PREVIOUSLY REPORTED AS MIXED ANAEROBIC FLORA PRESENT.  CALL LAB IF FURTHER IID REQUIRED. CULTURE REINCUBATED FOR BETTER GROWTH Performed at [facility], [place] [address], [place], [place] [address]    Report Status PENDING  Incomplete   Organism ID, Bacteria STAPHYLOCOCCUS AUREUS  Final      Susceptibility   Staphylococcus aureus - MIC*    CIPROFLOXACIN <=0.5 SENSITIVE Sensitive     ERYTHROMYCIN >=8 RESISTANT Resistant     GENTAMICIN <=0.5 SENSITIVE Sensitive     OXACILLIN 0.5 SENSITIVE Sensitive     TETRACYCLINE >=16 RESISTANT Resistant      VANCOMYCIN <=0.5 SENSITIVE Sensitive     TRIMETH/SULFA <=10 SENSITIVE Sensitive     CLINDAMYCIN <=0.25 SENSITIVE Sensitive     RIFAMPIN <=0.5 SENSITIVE Sensitive     Inducible Clindamycin NEGATIVE Sensitive     * RARE STAPHYLOCOCCUS AUREUS  MRSA Next Gen by PCR, Nasal     Status: None   Collection Time: 09/18/2021  6:58 PM   Specimen: Nasal Mucosa; Nasal Swab  Result Value Ref Range Status   MRSA by PCR Next Gen NOT DETECTED NOT DETECTED Final    Comment: (NOTE) The GeneXpert MRSA Assay (FDA approved for NASAL specimens only), is one component of a comprehensive MRSA colonization surveillance program. It is not intended to diagnose MRSA infection nor to guide or monitor treatment for MRSA infections. Test performance is not FDA approved in patients less than 85 years old. Performed at [facility], [address] [place], [place] [address]     Coagulation Studies: Recent Labs    09/27/2021 0512  LABPROT 19.1*  INR 1.6*     Urinalysis: No results for input(s): COLORURINE, LABSPEC, PHURINE, GLUCOSEU, HGBUR, BILIRUBINUR, KETONESUR, PROTEINUR, UROBILINOGEN, NITRITE, LEUKOCYTESUR in the last 72 hours.  Invalid input(s): APPERANCEUR     Imaging: DG Chest 1 View  Result Date: 09/01/2021 CLINICAL DATA:  Status post cardiac arrest.  Intubation film. EXAM: CHEST  1 VIEW COMPARISON:  Portable chest 09/10/2021. FINDINGS: 4:16 a.m., 09/11/2021. The patient is now intubated. The tip tube is 6.3 cm from the carina. NGT enters the stomach with the intragastric course not seen. The heart is enlarged. There are increasing septal lines from the level of the aortic arch down consistent with development of moderate interstitial edema. There are worsening perihilar alveolar opacities most likely due to alveolar edema, underlying pneumonia also a possibility. There is increased vascular engorgement centrally, with small pleural effusions beginning to form. IMPRESSION: 1. Cardiomegaly  with CHF and worsening moderate interstitial edema. 2. Perihilar opacities most likely alveolar edema, pneumonia not strictly excluded. 3. Small pleural effusions. Electronically Signed   By: Telford Nab M.D.   On: 09/10/2021 04:26   MR BRAIN WO CONTRAST  Result Date: 09/10/2021 CLINICAL DATA:  Mental status change, unknown cause EXAM: MRI HEAD WITHOUT CONTRAST TECHNIQUE: Multiplanar, multiecho pulse sequences of the brain and surrounding structures  were obtained without intravenous contrast. COMPARISON:  CT head 09/08/2021. FINDINGS: Severely motion limited study with multiple nondiagnostic sequences. Within this limitation: Brain: No evidence of acute infarct. No hydrocephalus. No definite evidence of acute hemorrhage. No midline shift or mass lesion. No visible extra-axial fluid collection. Vascular: Major arterial flow voids are maintained at the skull base. Skull and upper cervical spine: Normal marrow signal. Sinuses/Orbits: Clear sinuses.  Unremarkable orbits. Other: No mastoid effusions. IMPRESSION: Severely motion limited study with multiple nondiagnostic sequences. No obvious/definite acute abnormality; however, pathology could be obscured. If the patient's mental status improves and is able, repeat exam could provide further evaluation. Electronically Signed   By: Margaretha Sheffield M.D.   On: 09/10/2021 14:08   DG Chest Port 1 View  Result Date: 09/10/2021 CLINICAL DATA:  Shortness of breath EXAM: PORTABLE CHEST 1 VIEW COMPARISON:  Previous study done on 09/21/2021 FINDINGS: Transverse diameter of heart is increased. Central pulmonary vessels are more prominent. There is interval increase in interstitial markings in the parahilar regions and lower lung fields. There are small linear densities in the right parahilar region and medial left lower lung fields suggesting subsegmental atelectasis. There is no new focal pulmonary consolidation. Lateral CP angles are clear. There is no definite  pneumothorax. There is a metallic foreign body partly obscuring the left apex. Enteric tube is noted traversing the esophagus with its tip in the stomach. IMPRESSION: Cardiomegaly. Central pulmonary vessels are more prominent suggesting CHF. Increased interstitial markings in both lungs suggest pulmonary edema. Electronically Signed   By: Elmer Picker M.D.   On: 09/10/2021 12:45   [place] EKG SITE RITE  Result Date: 09/11/2021 If Site Rite image not attached, placement could not be confirmed due to current cardiac rhythm.    Medications:    sodium chloride     dexmedetomidine (PRECEDEX) IV infusion 1.2 mcg/kg/hr (09/11/2021 0800)   feeding supplement (OSMOLITE 1.5 CAL) Stopped (09/06/2021 0035)   fentaNYL infusion INTRAVENOUS 50 mcg/hr (09/05/2021 0800)   lactated ringers 75 mL/hr at 09/23/2021 0700   phenylephrine (NEO-SYNEPHRINE) Adult infusion 20 mcg/min (09/08/2021 0800)   piperacillin-tazobactam (ZOSYN)  IV 12.5 mL/hr at 09/03/2021 0800   potassium PHOSPHATE IVPB (in mmol)     thiamine injection      aspirin EC  81 mg Oral Daily   atorvastatin  40 mg Per Tube QPM   chlorhexidine  15 mL Mouth Rinse BID   Chlorhexidine Gluconate Cloth  6 each Topical Daily   cinacalcet  30 mg Oral Q supper   diazepam  5 mg Per NG tube Q8H   docusate  100 mg Per Tube BID   enoxaparin (LOVENOX) injection  40 mg Subcutaneous Q24H   feeding supplement (PROSource TF)  45 mL Per Tube BID   folic acid  1 mg Per Tube Daily   free water  30 mL Per Tube Q4H   insulin aspart  0-15 Units Subcutaneous Q4H   insulin aspart  2 Units Subcutaneous Q4H   mouth rinse  15 mL Mouth Rinse q12n4p   metoprolol tartrate  25 mg Per Tube BID   multivitamin with minerals  1 tablet Per Tube Daily   nutrition supplement (JUVEN)  1 packet Per Tube BID BM   pantoprazole (PROTONIX) IV  40 mg Intravenous Q24H   polyethylene glycol  17 g Per Tube Daily   sodium chloride flush  10-40 mL Intracatheter Q12H   spironolactone  25 mg Per  Tube Daily   [START ON 09/18/2021] thiamine  injection  100 mg Intravenous Daily   acetaminophen, acetaminophen, fentaNYL, morphine injection, ondansetron **OR** ondansetron (ZOFRAN) IV, oxyCODONE, sodium chloride flush  Assessment/ Plan:  55 y.o. 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 Lab Results  Component Value Date   PTH 79 (H) 08/31/2021   CALCIUM 10.1 09/21/2021   PHOS 1.7 (L) 09/09/2021    Elevated PTH CXR - neg for findings of sarcoid Thyroid studies normal in 06/2020 SPEP neg for myeloma in 06/2020 Primary Hyperparathyroidism, Neck ultrasound shows 1.5 cm left parathyroid adenoma. Continue with Sensipar.  Follow-up with endocrinology as outpatient. Agree with forced diuresis with IV fluids and Lasix. Calcium levels have improved significantly and are now in the upper normal range.  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  #Hypokalemia Lab Results  Component Value Date   K 3.6 09/14/2021  IV fluids changed to lactated Ringer's. Continue small dose of spironolactone. Check magnesium and replace as necessary  #Hypophosphatemia Should improve with tube feeds Agree with IV replacement    LOS: [place] 12/15/20229:39 [place], [place]  Note: This note was prepared with Dragon  dictation. Any transcription errors are unintentional

## 2021-09-12 NOTE — Anesthesia Preprocedure Evaluation (Addendum)
Anesthesia Evaluation  Patient identified by MRN, date of birth, ID bandGeneral Assessment Comment:obtunded   Reviewed: Allergy & Precautions, NPO status , Patient's Chart, lab work & pertinent test results  History of Anesthesia Complications Negative for: history of anesthetic complications  Airway Mallampati: III  TM Distance: >3 FB Neck ROM: full    Dental  (+) Chipped, Poor Dentition, Missing   Pulmonary Current Smoker and Patient abstained from smoking.,     + decreased breath sounds      Cardiovascular hypertension, + Past MI and +CHF (HFrEF)  + dysrhythmias (short period of Afib early this admission. Currently in NS.)  Rhythm:irregular Rate:Tachycardia  Pressor support  ECHO 09/08/2021: 1. Left ventricular ejection fraction, by estimation, is 30 to 35%. The  left ventricle has moderately decreased function. The left ventricle  demonstrates regional wall motion abnormalities (see scoring  diagram/findings for description). The left  ventricular internal cavity size was mildly dilated. There is mild left  ventricular hypertrophy. Left ventricular diastolic parameters are  indeterminate. There is severe hypokinesis of the left ventricular,  mid-apical anteroseptal wall and anterior wall.  There is akinesis of the left ventricular, apical segment. The average  left ventricular global longitudinal strain is -9.0 %. The global  longitudinal strain is abnormal.  2. Right ventricular systolic function is normal. The right ventricular  size is normal. Tricuspid regurgitation signal is inadequate for assessing  PA pressure.  3. Left atrial size was moderately dilated.  4. The mitral valve is normal in structure. No evidence of mitral valve  regurgitation. No evidence of mitral stenosis.  5. The aortic valve is normal in structure. Aortic valve regurgitation is  not visualized. Aortic valve sclerosis/calcification is present,  without  any evidence of aortic stenosis.    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  Head CT was negative. He was septic from osteomyelitis of great toe of right foot.  NSTEMI, pt was evaluated by cardiology.   He underwent R BKA 12/12 and is on broad-spectrum antibiotics. Surgery plans for AKA today due to suspected poor wound healing.   Pt intubation for respiratory failure occurred 12/15 with intubation. Pt is being diuresed.  Intubation Note:  Patient was intubated with endotracheal tube using Glidescope.  View was Grade 1 full glottis .  Number of attempts was 1.  Colorimetric CO2 detector was consistent with tracheal placement.   Reproductive/Obstetrics negative OB ROS                           Anesthesia Physical  Anesthesia Plan  ASA: 4  Anesthesia Plan: General ETT   Post-op Pain Management:    Induction: Intravenous  PONV Risk Score and Plan: Treatment may vary due to age or medical condition  Airway Management Planned: Oral ETT  Additional Equipment:   Intra-op Plan:   Post-operative Plan: Post-operative intubation/ventilation  Informed Consent: I have reviewed the patients History and Physical, chart, labs and discussed the procedure including the risks, benefits and alternatives for the proposed  anesthesia with the patient or authorized representative who has indicated his/her understanding and acceptance.     Dental  Advisory Given  Plan Discussed with: Anesthesiologist, CRNA and Surgeon  Anesthesia Plan Comments: (Phone consent from the patients sister Leone Payor  Sister consented for risks of anesthesia including but not limited to:  - adverse reactions to medications - damage to eyes, teeth, lips or other oral mucosa - nerve damage due to positioning  - sore throat or hoarseness - Damage to heart, brain, nerves, lungs, other parts of body or loss of life  She voiced understanding.)     Anesthesia Quick Evaluation

## 2021-09-12 NOTE — Progress Notes (Addendum)
Progress Note  Patient Name: [name] Date of Encounter: 09/04/2021  [facility] Cardiologist: [facility]  Subjective   Recent events and data reviewed Ejection fraction 30 to 35%, old anterior MI, non-STEMI, alcohol abuse, osteo with amputation on broad-spectrum antibiotics   No arrhythmia overnight Remains intubated sedated On pressors, blood pressure low but stable Maintaining normal sinus rhythm   Inpatient Medications    Scheduled Meds:  aspirin  81 mg Per Tube Daily   atorvastatin  40 mg Per Tube QPM   chlorhexidine gluconate (MEDLINE KIT)  15 mL Mouth Rinse BID   Chlorhexidine Gluconate Cloth  6 each Topical Daily   cinacalcet  30 mg Oral Q supper   diazepam  5 mg Per NG tube Q8H   docusate  100 mg Per Tube BID   enoxaparin (LOVENOX) injection  40 mg Subcutaneous Q24H   feeding supplement (PROSource TF)  45 mL Per Tube TID   folic acid  1 mg Per Tube Daily   free water  30 mL Per Tube Q4H   insulin aspart  0-15 Units Subcutaneous Q4H   insulin aspart  2 Units Subcutaneous Q4H   mouth rinse  15 mL Mouth Rinse 10 times per day   metoprolol tartrate  25 mg Per Tube BID   multivitamin with minerals  1 tablet Per Tube Daily   nutrition supplement (JUVEN)  1 packet Per Tube BID BM   pantoprazole (PROTONIX) IV  40 mg Intravenous Q24H   polyethylene glycol  17 g Per Tube Daily   sodium chloride flush  10-40 mL Intracatheter Q12H   spironolactone  25 mg Per Tube Daily   [START ON 09/18/2021] thiamine injection  100 mg Intravenous Daily   Continuous Infusions:  sodium chloride     dexmedetomidine (PRECEDEX) IV infusion 1.2 mcg/kg/hr (09/14/2021 1712)   feeding supplement (VITAL 1.5 CAL) 1,000 mL (09/08/2021 1653)   fentaNYL infusion INTRAVENOUS 200 mcg/hr (09/05/2021 1600)   lactated ringers Stopped (09/25/2021 1310)   phenylephrine (NEO-SYNEPHRINE) Adult infusion 50 mcg/min (09/17/2021 1707)   piperacillin-tazobactam (ZOSYN)  IV 3.375 g (08/31/2021 1444)   thiamine injection  Stopped (09/11/2021 1027)   PRN Meds: acetaminophen, acetaminophen, fentaNYL, morphine injection, ondansetron **OR** ondansetron (ZOFRAN) IV, oxyCODONE, sodium chloride flush   Vital Signs    Vitals:   09/21/2021 1534 09/27/2021 1540 09/11/2021 1600 09/02/2021 1700  BP:   109/67 112/63  Pulse:   85 86  Resp:   (!) 25 (!) 23  Temp:   98.6 F (37 C)   TempSrc:   Oral   SpO2: 97% 94% 95% 96%  Weight:      Height:        Intake/Output Summary (Last 24 hours) at 09/11/2021 1753 Last data filed at 09/10/2021 1700 Gross per 24 hour  Intake 4022.18 ml  Output 1700 ml  Net 2322.18 ml   Last 3 Weights 09/19/2021 09/11/2021 06/13/2021  Weight (lbs) 208 lb 15.9 oz 205 lb 206 lb  Weight (kg) 94.8 kg 92.987 kg 93.441 kg      Telemetry    Normal sinus rhythm- Personally Reviewed  ECG    - Personally Reviewed  Physical Exam   GEN: Sedated Neck: Unable to estimate JVD Cardiac: RRR, no murmurs, rubs, or gallops.  Respiratory: Coarse breath sounds GI: Soft,  non-distended  MS: No edema; No deformity. Amputation lower extremity Neuro:  Nonfocal  Psych: Normal affect   Labs    High Sensitivity Troponin:   Recent Labs  Lab  09/11/2021 0658 09/18/2021 0912 09/08/2021 1206 09/15/2021 1444  Boron*     Chemistry Recent Labs  Lab 09/24/2021 1749 09/15/2021 0912 09/10/21 0402 09/11/21 0341 09/11/21 1144 09/08/2021 0512  NA 128*   < > 137 139 142 143  K 4.0   < > 3.6 2.8* 3.2* 3.6  CL 87*   < > 103 106 107 108  CO2 27   < > '31 30 27 31  ' GLUCOSE 317*   < > 138* 210* 292* 203*  BUN 18   < > 19 22* 24* 28*  CREATININE 0.90   < > 0.82 0.76 0.78 0.81  CALCIUM 13.6*   < > 13.0* 11.2* 11.2* 10.1  MG  --    < > 2.1 1.8 1.8 1.7  PROT 6.4*  --  5.7*  --   --   --   ALBUMIN 2.6*  --  1.8*  --  2.5*  --   AST 33  --  39  --   --   --   ALT 23  --  19  --   --   --   ALKPHOS 94  --  78  --   --   --   BILITOT 3.1*  --  1.3*  --   --   --   GFRNONAA >60   < > >60  >60 >60 >60  ANIONGAP 14   < > 3* 3* 8 4*   < > = values in this interval not displayed.    Lipids No results for input(s): CHOL, TRIG, HDL, LABVLDL, LDLCALC, CHOLHDL in the last 168 hours.  Hematology Recent Labs  Lab 09/21/2021 0259 09/10/21 0402 09/10/2021 0505  WBC 8.2 7.2 14.0*  RBC 3.91* 3.96* 3.36*  HGB 11.9* 12.3* 10.5*  HCT 34.9* 36.5* 32.1*  MCV 89.3 92.2 95.5  MCH 30.4 31.1 31.3  MCHC 34.1 33.7 32.7  RDW 12.1 12.2 12.7  PLT 153 151 234   Thyroid  Recent Labs  Lab 09/25/2021 0512  TSH 2.153    BNP Recent Labs  Lab 09/10/21 0402 09/22/2021 0505  BNP 600.5* 980.5*    DDimer No results for input(s): DDIMER in the last 168 hours.   Radiology    DG Chest 1 View  Result Date: 09/01/2021 CLINICAL DATA:  Status post cardiac arrest.  Intubation film. EXAM: CHEST  1 VIEW COMPARISON:  Portable chest 09/10/2021. FINDINGS: 4:16 a.m., 09/24/2021. The patient is now intubated. The tip tube is 6.3 cm from the carina. NGT enters the stomach with the intragastric course not seen. The heart is enlarged. There are increasing septal lines from the level of the aortic arch down consistent with development of moderate interstitial edema. There are worsening perihilar alveolar opacities most likely due to alveolar edema, underlying pneumonia also a possibility. There is increased vascular engorgement centrally, with small pleural effusions beginning to form. IMPRESSION: 1. Cardiomegaly with CHF and worsening moderate interstitial edema. 2. Perihilar opacities most likely alveolar edema, pneumonia not strictly excluded. 3. Small pleural effusions. Electronically Signed   By: Telford Nab M.D.   On: 09/20/2021 04:26   [place] EKG SITE RITE  Result Date: 09/11/2021 If Site Rite image not attached, placement could not be confirmed due to current cardiac rhythm.   Cardiac Studies     Patient Profile     55 y.o. male with history of hypertension, diabetes, alcohol and tobacco use presenting  with right foot wound, diagnosed with right foot  osteomyelitis s/p resection.  Being seen for CHF and NSTEMI  Assessment & Plan    HFrEF EF 30 to 35% -Likely ischemic as detailed below Given hypotension, unable to initiate heart failure medications Not a good candidate for ischemic work-up   2.  Paroxysmal A. fib in light of sepsis/osteomyelitis In the setting of above sepsis On heparin infusion -Currently in sinus rhythm Currently on metoprolol 25 twice daily   3.  NSTEMI Elevated troponin on arrival 5900 in the setting of atrial fibrillation rate 140 bpm on EKG EKG concerning for old anterior MI Echocardiogram with old anterior MI -Not a candidate at this time for ischemic work-up  Would continue aspirin, Lipitor, beta-blocker   4.  Osteomyelitis s/p resection -Antibiotics, -Generally amputation being considered -Management as per vascular surgery   5.  EtOH use, tobacco use -Management as per ICU team.  Monitor for withdrawals.  Options limited, very sick gentleman, multiorgan system failure Consider palliative  Total encounter time more than 35 minutes  Greater than 50% was spent in counseling and coordination of care with the patient    For questions or updates, please contact [place] Please consult [email or web address] for contact info under        Signed, Ida Rogue, MD  09/15/2021, 5:53 PM

## 2021-09-13 ENCOUNTER — Encounter: Payer: Self-pay | Admitting: Vascular Surgery

## 2021-09-13 DIAGNOSIS — A419 Sepsis, unspecified organism: Secondary | ICD-10-CM

## 2021-09-13 LAB — AEROBIC/ANAEROBIC CULTURE W GRAM STAIN (SURGICAL/DEEP WOUND)

## 2021-09-13 LAB — CULTURE, BLOOD (ROUTINE X 2): Special Requests: ADEQUATE

## 2021-09-13 LAB — BASIC METABOLIC PANEL
Anion gap: 3 — ABNORMAL LOW (ref 5–15)
BUN: 34 mg/dL — ABNORMAL HIGH (ref 6–20)
CO2: 27 mmol/L (ref 22–32)
Calcium: 9.5 mg/dL (ref 8.9–10.3)
Chloride: 111 mmol/L (ref 98–111)
Creatinine, Ser: 1.06 mg/dL (ref 0.61–1.24)
GFR, Estimated: >60 mL/min (ref >60)
Glucose, Bld: 277 mg/dL — ABNORMAL HIGH (ref 70–99)
Potassium: 4 mmol/L (ref 3.5–5.1)
Sodium: 141 mmol/L (ref 135–145)

## 2021-09-13 LAB — GLUCOSE, CAPILLARY
Glucose-Capillary: 207 mg/dL — ABNORMAL HIGH (ref 70–99)
Glucose-Capillary: 273 mg/dL — ABNORMAL HIGH (ref 70–99)
Glucose-Capillary: 385 mg/dL — ABNORMAL HIGH (ref 70–99)

## 2021-09-13 LAB — TRIGLYCERIDES: Triglycerides: 102 mg/dL (ref <150)

## 2021-09-13 LAB — PREPARE RBC (CROSSMATCH)

## 2021-09-13 LAB — MAGNESIUM: Magnesium: 1.6 mg/dL — ABNORMAL LOW (ref 1.7–2.4)

## 2021-09-13 LAB — PHOSPHORUS: Phosphorus: 2.2 mg/dL — ABNORMAL LOW (ref 2.5–4.6)

## 2021-09-13 MED ORDER — GLYCOPYRROLATE 0.2 MG/ML IJ SOLN: 0.2000 mg | INTRAMUSCULAR | Status: DC | PRN

## 2021-09-13 MED ORDER — HYDROCORTISONE SOD SUC (PF) 100 MG IJ SOLR
100.0000 mg | Freq: Three times a day (TID) | INTRAMUSCULAR | Status: DC
  Administered 2021-09-13 (×2): 100 mg via INTRAVENOUS
  Filled 2021-09-13 (×2): qty 2

## 2021-09-13 MED ORDER — GLYCOPYRROLATE 1 MG PO TABS
1.0000 mg | ORAL_TABLET | ORAL | Status: DC | PRN
  Filled 2021-09-13: qty 1

## 2021-09-13 MED ORDER — DEXTROSE 5 % IV SOLN: INTRAVENOUS | Status: DC

## 2021-09-13 MED ORDER — ACETAMINOPHEN 325 MG PO TABS: 650.0000 mg | ORAL_TABLET | Freq: Four times a day (QID) | ORAL | Status: DC | PRN

## 2021-09-13 MED ORDER — HYDROMORPHONE HCL 1 MG/ML IJ SOLN
1.0000 mg | INTRAMUSCULAR | Status: DC | PRN
  Administered 2021-09-13 (×2): 1 mg via INTRAVENOUS

## 2021-09-13 MED ORDER — POLYVINYL ALCOHOL 1.4 % OP SOLN
1.0000 [drp] | Freq: Four times a day (QID) | OPHTHALMIC | Status: DC | PRN
  Filled 2021-09-13: qty 15

## 2021-09-13 MED ORDER — VASOPRESSIN 20 UNITS/100 ML INFUSION FOR SHOCK
0.0000 [IU]/min | INTRAVENOUS | Status: DC
  Administered 2021-09-13 (×2): 0.03 [IU]/min via INTRAVENOUS
  Filled 2021-09-13 (×2): qty 100

## 2021-09-13 MED ORDER — GLYCOPYRROLATE 0.2 MG/ML IJ SOLN
0.2000 mg | INTRAMUSCULAR | Status: DC | PRN
  Administered 2021-09-13: 0.2 mg via INTRAVENOUS
  Filled 2021-09-13: qty 1

## 2021-09-13 MED ORDER — MIDAZOLAM-SODIUM CHLORIDE 100-0.9 MG/100ML-% IV SOLN
2.0000 mg/h | INTRAVENOUS | Status: DC
  Administered 2021-09-13: 2 mg/h via INTRAVENOUS
  Filled 2021-09-13: qty 100

## 2021-09-13 MED ORDER — HYDROMORPHONE HCL 1 MG/ML IJ SOLN
2.0000 mg | Freq: Once | INTRAMUSCULAR | Status: AC
  Administered 2021-09-13: 2 mg via INTRAVENOUS
  Filled 2021-09-13: qty 2

## 2021-09-13 MED ORDER — SODIUM CHLORIDE 0.9 % IV SOLN
2.0000 mg/h | INTRAVENOUS | Status: DC
  Administered 2021-09-13: 2 mg/h via INTRAVENOUS
  Filled 2021-09-13: qty 5

## 2021-09-13 MED ORDER — ACETAMINOPHEN 650 MG RE SUPP: 650.0000 mg | Freq: Four times a day (QID) | RECTAL | Status: DC | PRN

## 2021-09-13 MED ORDER — SODIUM PHOSPHATES 45 MMOLE/15ML IV SOLN
15.0000 mmol | Freq: Once | INTRAVENOUS | Status: DC
  Administered 2021-09-13: 15 mmol via INTRAVENOUS
  Filled 2021-09-13: qty 5

## 2021-09-13 MED ORDER — DIPHENHYDRAMINE HCL 50 MG/ML IJ SOLN: 25.0000 mg | INTRAMUSCULAR | Status: DC | PRN

## 2021-09-13 MED ORDER — MIDAZOLAM HCL 2 MG/2ML IJ SOLN
2.0000 mg | INTRAMUSCULAR | Status: DC | PRN
  Administered 2021-09-13: 2 mg via INTRAVENOUS
  Filled 2021-09-13: qty 4

## 2021-09-13 MED ORDER — MAGNESIUM SULFATE 2 GM/50ML IV SOLN
2.0000 g | Freq: Once | INTRAVENOUS | Status: AC
  Administered 2021-09-13: 2 g via INTRAVENOUS
  Filled 2021-09-13: qty 50

## 2021-09-13 MED ORDER — MIDAZOLAM HCL 2 MG/2ML IJ SOLN: 2.0000 mg | INTRAMUSCULAR | Status: DC | PRN

## 2021-09-15 LAB — TYPE AND SCREEN
ABO/RH(D): O POS
Antibody Screen: POSITIVE
DAT, IgG: NEGATIVE
DAT, complement: NEGATIVE
Unit division: 0
Unit division: 0
Unit division: 0
Unit division: 0

## 2021-09-15 LAB — BPAM RBC
Blood Product Expiration Date: 202301022359
Blood Product Expiration Date: 202301022359
Blood Product Expiration Date: 202301022359
Blood Product Expiration Date: 202301102359
Unit Type and Rh: 5100
Unit Type and Rh: 5100
Unit Type and Rh: 5100
Unit Type and Rh: 5100

## 2021-09-29 NOTE — Death Summary Note (Signed)
DEATH SUMMARY   Patient Details  Name: [name] MRN: [id number] DOB: 26-Oct-1965  Admission/Discharge Information   Admit Date:  2021/09/24  Date of Death: Date of Death: September 30, 2021  Time of Death: Time of Death: 12-29-2032  Length of Stay: 12-20-22  Referring Physician: Pcp, No   Reason(s) for Hospitalization  Sepsis Acute Osteomyelitis of the right foot Acute HFrEF Non-STEMI Paroxsymal Atrial Fibrillation Acute Metabolic Encephalopathy Delirium Tremens Diabetes Mellitus Type II Hypercalcemia  Diagnoses  Preliminary cause of death:  Severe Sepsis secondary to acute osteomyelitis in the setting of infected diabetic foot ulcer  Secondary Diagnoses (including complications and co-morbidities):  Principal Problem:   Sepsis ([place]) Active Problems:   Diabetes mellitus with hyperglycemia ([place])   Hypercalcemia   NSTEMI (non-ST elevated myocardial infarction) ([place])   Nicotine dependence   Alcohol dependence ([place])   Diabetic foot ulcer ([place])   Hyponatremia   Malnutrition of moderate degree   HFrEF (heart failure with reduced ejection fraction) [facility])   Brief Hospital Course (including significant findings, care, treatment, and services provided and events leading to death)  [name] is a 56 y.o. year old male presenting to [facility] ED on 09-24-21 with complaints of feeling tired over the past day in the setting of of a wound on the lateral aspect of his right foot with foul-smelling drainage and increased pain. 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  Post surgery he followed up with the podiatrist once on 07/09/2021 at which time he had a 2 x 2 full-thickness ulcer without signs of infection. Upon arrival patient was tachycardic in A-fib RVR, tachypneic with elevated lactic acid and procalcitonin levels> he was worked up per sepsis protocol with IV fluid and empiric antibiotic therapy.  There was also concern for possible NSTEMI and a heparin drip was started.     Hospital course: Podiatry consulted whose recommendations were vascular surgical intervention for potential amputation of RLE due to the concern for osteomyelitis & x-rays revealing soft tissue gas. Vascular surgery recommended right trans tibial amputation, which was performed 09/01/2021. Cardiology following due to N-STEMI, HFrEF and PAF on heparin drip with plans for ischemic work-up once stabilized from surgery & infection. Nephrology consulted for guidance on hypercalcemia, and ID following to assist with treatment of R ft infection. Patient continues to be altered in the setting of ETOH use & infection. [place] 09/08/21: negative for acute abnormality. Post-op the patient was lethargic & hypotensive after receiving morphine & ativan and transferred to SDU.  PCCM later consulted to assist with management and monitoring.  Please see [place] section below for full detailed hospital course.   Significant Hospital Events: Including procedures, antibiotic start and stop dates in addition to other pertinent events   09-24-2021: Admitted by [facility] with sepsis s/t R infection/ N-STEMI/ A-fib RVR with X-ray showing soft tissue gas. Podiatry & vascular consulted 09/08/21: Pt confused, CTH negative, CIWA initiated. Cardiology consulted for management of N-STEMI & A-fib RVR with plans for ischemic w/u after vascular intervention and pt stabilization 09/24/2021: status post guillotine R BKA, in post op patient lethargic & hypotensive transferred to SDU. PCCM consulted. 09/04/2021: Required intubation for airway protection.  A-line placed. 09/25/2021: Vascular surgery performed I&D of previous amputation.  Palliative Care consulted.  Pt's family leaning towards comfort measures 09/30/2021: Pt's family requested transition to [place].  Expired later in the evening following withdrawal of care.   Pertinent Labs and Studies  Significant Diagnostic Studies DG  Chest 1 View  Result Date:  09/06/2021 CLINICAL DATA:  Status post cardiac arrest.  Intubation film. EXAM: CHEST  1 VIEW COMPARISON:  Portable chest 09/10/2021. FINDINGS: 4:16 a.m., 09/08/2021. The patient is now intubated. The tip tube is 6.3 cm from the carina. NGT enters the stomach with the intragastric course not seen. The heart is enlarged. There are increasing septal lines from the level of the aortic arch down consistent with development of moderate interstitial edema. There are worsening perihilar alveolar opacities most likely due to alveolar edema, underlying pneumonia also a possibility. There is increased vascular engorgement centrally, with small pleural effusions beginning to form. IMPRESSION: 1. Cardiomegaly with CHF and worsening moderate interstitial edema. 2. Perihilar opacities most likely alveolar edema, pneumonia not strictly excluded. 3. Small pleural effusions. Electronically Signed   By: Telford Nab M.D.   On: 09/11/2021 04:26   DG Chest 1 View  Result Date: 09/03/2021 CLINICAL DATA:  Sepsis. EXAM: CHEST  1 VIEW COMPARISON:  None. FINDINGS: The heart size and mediastinal contours are within normal limits. Both lungs are clear. The visualized skeletal structures are unremarkable. IMPRESSION: No active disease. Electronically Signed   By: Marijo Conception M.D.   On: 09/08/2021 07:50   DG Tibia/Fibula Right  Result Date: 08/29/2021 CLINICAL DATA:  Wound. EXAM: RIGHT TIBIA AND FIBULA - 2 VIEW COMPARISON:  None. FINDINGS: There is no evidence of fracture or other focal bone lesions. Soft tissue gas is seen in the ankle and lower calf region concerning for possible cellulitis. IMPRESSION: Soft tissue gas seen in distal calf soft tissues concerning for cellulitis. No definite bony abnormality is noted. Electronically Signed   By: Marijo Conception M.D.   On: 09/06/2021 10:54   DG Ankle Complete Right  Result Date: 09/23/2021 CLINICAL DATA:  Right ankle wound. EXAM: RIGHT ANKLE - COMPLETE 3+ VIEW COMPARISON:   None. FINDINGS: There is no evidence of fracture, dislocation, or joint effusion. There is no evidence of arthropathy or other focal bone abnormality. Status post transmetatarsal amputation of the foot. Soft tissue gas is seen overlying lateral malleolus concerning for cellulitis. No definite lytic destruction is seen to suggest osteomyelitis. IMPRESSION: Soft tissue gas seen overlying lateral malleolus concerning for cellulitis. No definite lytic destruction is seen to suggest osteomyelitis. Electronically Signed   By: Marijo Conception M.D.   On: 09/23/2021 10:52   DG Abd 1 View  Result Date: 09/10/2021 CLINICAL DATA:  NG placement. EXAM: ABDOMEN - 1 VIEW COMPARISON:  Earlier radiograph dated 09/21/2021. FINDINGS: Interval advancement of the enteric tube with tip and side port in the epigastric area likely in the proximal stomach. The NGT extends to the distal stomach and fold back on itself. No interval change in the bowel gas pattern. IMPRESSION: Enteric tube with tip and side-port in the proximal stomach. Electronically Signed   By: Anner Crete M.D.   On: 09/10/2021 01:11   DG Abd 1 View  Result Date: 09/25/2021 CLINICAL DATA:  OG tube placement EXAM: ABDOMEN - 1 VIEW COMPARISON:  None. FINDINGS: Esophageal tube tip overlies GE junction, side-port overlies distal esophagus, further advancement by at least 10 cm recommended. IMPRESSION: Esophageal tube side-port overlies distal esophagus, further advancement recommended for more optimal positioning These results will be called to the ordering clinician or representative by the Radiologist Assistant, and communication documented in the PACS or [employer]. Electronically Signed   By: Donavan Foil M.D.   On: 09/06/2021 23:57   CT HEAD WO  CONTRAST (5MM)  Result Date: 09/08/2021 CLINICAL DATA:  Altered level of consciousness, right foot wound, hypertension, diabetes EXAM: CT HEAD WITHOUT CONTRAST TECHNIQUE: Contiguous axial images were  obtained from the base of the skull through the vertex without intravenous contrast. COMPARISON:  02/06/2019 FINDINGS: Evaluation is extremely limited due to patient motion throughout the exam. Multiple attempts were made at acquiring diagnostic images. Brain: Limited evaluation due to patient motion demonstrates no signs of acute infarct or hemorrhage. The lateral ventricles and midline structures are unremarkable. No acute extra-axial fluid collections. No mass effect. Vascular: No hyperdense vessel or unexpected calcification. Skull: Normal. Negative for fracture or focal lesion. Sinuses/Orbits: No acute finding. Other: None. IMPRESSION: 1. Limited evaluation due to patient motion. 2. No acute intracranial process. Electronically Signed   By: Randa Ngo M.D.   On: 09/08/2021 17:40   MR BRAIN WO CONTRAST  Result Date: 09/10/2021 CLINICAL DATA:  Mental status change, unknown cause EXAM: MRI HEAD WITHOUT CONTRAST TECHNIQUE: Multiplanar, multiecho pulse sequences of the brain and surrounding structures were obtained without intravenous contrast. COMPARISON:  CT head 09/08/2021. FINDINGS: Severely motion limited study with multiple nondiagnostic sequences. Within this limitation: Brain: No evidence of acute infarct. No hydrocephalus. No definite evidence of acute hemorrhage. No midline shift or mass lesion. No visible extra-axial fluid collection. Vascular: Major arterial flow voids are maintained at the skull base. Skull and upper cervical spine: Normal marrow signal. Sinuses/Orbits: Clear sinuses.  Unremarkable orbits. Other: No mastoid effusions. IMPRESSION: Severely motion limited study with multiple nondiagnostic sequences. No obvious/definite acute abnormality; however, pathology could be obscured. If the patient's mental status improves and is able, repeat exam could provide further evaluation. Electronically Signed   By: Margaretha Sheffield M.D.   On: 09/10/2021 14:08   MR ANKLE RIGHT WO  CONTRAST  Result Date: 09/08/2021 CLINICAL DATA:  Osteomyelitis suspected. Ulcer at fifth metatarsal base, gas on x-rays. EXAM: MRI OF THE RIGHT ANKLE WITHOUT CONTRAST TECHNIQUE: Multiplanar, multisequence MR imaging of the ankle was performed. No intravenous contrast was administered. COMPARISON:  Radiograph dated September 07, 2021 FINDINGS: TENDONS Peroneal: Peroneal longus tendon intact. Peroneal brevis intact. There is fluid tracking along the peroneus longus and brevis concerning for tenosynovitis. Posteromedial: Posterior tibial tendon intact. Flexor hallucis longus tendon intact. Flexor digitorum longus tendon intact. Fluid tracking along the flexor tendons. Anterior: Tibialis anterior tendon intact. Extensor hallucis longus tendon intact Extensor digitorum longus tendon intact. Achilles:  Mild tendinopathy without discrete tear. Plantar Fascia: Normal in size and signal. LIGAMENTS Lateral: Anterior talofibular ligament intact. Calcaneofibular ligament intact. Posterior talofibular ligament intact. Anterior and posterior tibiofibular ligaments intact. Medial: Deltoid ligament intact. Spring ligament intact. CARTILAGE Ankle Joint: No joint effusion. Normal ankle mortise. No chondral defect. Subtalar Joints/Sinus Tarsi: Normal subtalar joints. No subtalar joint effusion. Normal sinus tarsi. Bones: Bone marrow edema about the base of the fifth metatarsal and cuboid. Normal marrow signal of the distal tibia/fibula, talus. Heterogeneous signal of the calcaneus without evidence of acute fracture. Postsurgical changes for prior amputation through the base of the metatarsals. Soft Tissue: Skin thickening and subcutaneous soft tissue edema. Skin irregularity about the lateral aspect of the base of the fifth metatarsal. IMPRESSION: 1. Bone marrow edema about the base of the fifth metatarsal and cuboid bone, which in the presence of adjacent skin defect is highly suspicious for acute osteomyelitis. 2. Generalized  subcutaneous soft tissue edema concerning for cellulitis. No drainable fluid collection or abscess. 3. Fluid tracking along the flexor and peroneal compartments tendon concerning  for tenosynovitis. Electronically Signed   By: Keane Police D.O.   On: 09/08/2021 08:24   DG Chest Port 1 View  Result Date: 09/10/2021 CLINICAL DATA:  Shortness of breath EXAM: PORTABLE CHEST 1 VIEW COMPARISON:  Previous study done on 09/19/2021 FINDINGS: Transverse diameter of heart is increased. Central pulmonary vessels are more prominent. There is interval increase in interstitial markings in the parahilar regions and lower lung fields. There are small linear densities in the right parahilar region and medial left lower lung fields suggesting subsegmental atelectasis. There is no new focal pulmonary consolidation. Lateral CP angles are clear. There is no definite pneumothorax. There is a metallic foreign body partly obscuring the left apex. Enteric tube is noted traversing the esophagus with its tip in the stomach. IMPRESSION: Cardiomegaly. Central pulmonary vessels are more prominent suggesting CHF. Increased interstitial markings in both lungs suggest pulmonary edema. Electronically Signed   By: Elmer Picker M.D.   On: 09/10/2021 12:45   DG Foot Complete Right  Result Date: 09/27/2021 CLINICAL DATA:  Right foot infection.  Open wound. EXAM: RIGHT FOOT COMPLETE - 3+ VIEW COMPARISON:  May 09, 2021. FINDINGS: Status post transmetatarsal amputation of the foot. Large soft tissue ulceration is seen overlying the residual portion of the proximal fifth metatarsal. There is irregularity involving this bone, but it is uncertain if it represents postsurgical change or possibly osteomyelitis. IMPRESSION: Large soft tissue ulceration is seen overlying the residual portion of proximal fifth metatarsal. There is noted irregularity involving this bone, but it is uncertain if it represents postsurgical change or possibly  osteomyelitis. Electronically Signed   By: Marijo Conception M.D.   On: 09/18/2021 07:53   ECHOCARDIOGRAM COMPLETE  Result Date: 09/23/2021    ECHOCARDIOGRAM REPORT   Patient Name:   [name] Date of Exam: 09/16/2021 Medical Rec #:  [id number]   Height:       75.0 in Accession #:    [id number]  Weight:       205.0 lb Date of Birth:  Jun 27, 1966   BSA:          2.218 m Patient Age:    65 years    BP:           97/66 mmHg Patient Gender: M           HR:           94 bpm. Exam Location:  [facility] Procedure: 2D Echo, Color Doppler, Cardiac Doppler and Strain Analysis STAT ECHO Indications:     I21.4 NSTEMI  History:         Patient has no prior history of Echocardiogram examinations.                  Risk Factors:Hypertension and Diabetes.  Sonographer:     Charmayne Sheer Referring Phys:  [id number] [place] Diagnosing Phys: Kathlyn Sacramento MD  Sonographer Comments: No subcostal window. Global longitudinal strain was attempted. IMPRESSIONS  1. Left ventricular ejection fraction, by estimation, is 30 to 35%. The left ventricle has moderately decreased function. The left ventricle demonstrates regional wall motion abnormalities (see scoring diagram/findings for description). The left ventricular internal cavity size was mildly dilated. There is mild left ventricular hypertrophy. Left ventricular diastolic parameters are indeterminate. There is severe hypokinesis of the left ventricular, mid-apical anteroseptal wall and anterior wall.  There is akinesis of the left ventricular, apical segment. The average left ventricular global longitudinal strain is -9.0 %. The global longitudinal strain is abnormal.  2. Right ventricular systolic function is normal. The right ventricular size is normal. Tricuspid regurgitation signal is inadequate for assessing PA pressure.  3. Left atrial size was moderately dilated.  4. The mitral valve is normal in structure. No evidence of mitral valve regurgitation. No evidence of mitral stenosis.  5.  The aortic valve is normal in structure. Aortic valve regurgitation is not visualized. Aortic valve sclerosis/calcification is present, without any evidence of aortic stenosis. FINDINGS  Left Ventricle: Left ventricular ejection fraction, by estimation, is 30 to 35%. The left ventricle has moderately decreased function. The left ventricle demonstrates regional wall motion abnormalities. Severe hypokinesis of the left ventricular, mid-apical anteroseptal wall and anterior wall. The average left ventricular global longitudinal strain is -9.0 %. The global longitudinal strain is abnormal. The left ventricular internal cavity size was mildly dilated. There is mild left ventricular hypertrophy. Left ventricular diastolic parameters are indeterminate. Right Ventricle: The right ventricular size is normal. No increase in right ventricular wall thickness. Right ventricular systolic function is normal. Tricuspid regurgitation signal is inadequate for assessing PA pressure. Left Atrium: Left atrial size was moderately dilated. Right Atrium: Right atrial size was normal in size. Pericardium: There is no evidence of pericardial effusion. Mitral Valve: The mitral valve is normal in structure. No evidence of mitral valve regurgitation. No evidence of mitral valve stenosis. MV peak gradient, 3.2 mmHg. The mean mitral valve gradient is 1.0 mmHg. Tricuspid Valve: The tricuspid valve is normal in structure. Tricuspid valve regurgitation is not demonstrated. No evidence of tricuspid stenosis. Aortic Valve: The aortic valve is normal in structure. Aortic valve regurgitation is not visualized. Aortic valve sclerosis/calcification is present, without any evidence of aortic stenosis. Aortic valve mean gradient measures 3.0 mmHg. Aortic valve peak  gradient measures 5.1 mmHg. Aortic valve area, by VTI measures 3.26 cm. Pulmonic Valve: The pulmonic valve was normal in structure. Pulmonic valve regurgitation is mild. No evidence of pulmonic  stenosis. Aorta: The aortic root is normal in size and structure. Venous: The inferior vena cava was not well visualized. IAS/Shunts: No atrial level shunt detected by color flow Doppler.  LEFT VENTRICLE PLAX 2D LVIDd:         5.30 cm   Diastology LVIDs:         4.20 cm   LV e' medial:    6.74 cm/s LV PW:         1.30 cm   LV E/e' medial:  14.2 LV IVS:        1.20 cm   LV e' lateral:   7.18 cm/s LVOT diam:     2.30 cm   LV E/e' lateral: 13.4 LV SV:         50 LV SV Index:   22        2D Longitudinal Strain LVOT Area:     4.15 cm  2D Strain GLS Avg:     -9.0 %  RIGHT VENTRICLE RV Basal diam:  3.20 cm RV S prime:     13.60 cm/s LEFT ATRIUM              Index        RIGHT ATRIUM           Index LA diam:        5.00 cm  2.25 cm/m   RA Area:     15.60 cm LA Vol (A2C):   103.0 ml 46.45 ml/m  RA Volume:   31.90 ml  14.39 ml/m LA Vol (  A4C):   78.1 ml  35.22 ml/m LA Biplane Vol: 91.9 ml  41.44 ml/m  AORTIC VALVE                    PULMONIC VALVE AV Area (Vmax):    3.24 cm     PV Vmax:       1.05 m/s AV Area (Vmean):   3.21 cm     PV Vmean:      74.200 cm/s AV Area (VTI):     3.26 cm     PV VTI:        0.182 m AV Vmax:           113.00 cm/s  PV Peak grad:  4.4 mmHg AV Vmean:          74.700 cm/s  PV Mean grad:  3.0 mmHg AV VTI:            0.153 m AV Peak Grad:      5.1 mmHg AV Mean Grad:      3.0 mmHg LVOT Vmax:         88.10 cm/s LVOT Vmean:        57.800 cm/s LVOT VTI:          0.120 m LVOT/AV VTI ratio: 0.78  AORTA Ao Root diam: 3.30 cm MITRAL VALVE MV Area (PHT): 4.74 cm    SHUNTS MV Area VTI:   3.00 cm    Systemic VTI:  0.12 m MV Peak grad:  3.2 mmHg    Systemic Diam: 2.30 cm MV Mean grad:  1.0 mmHg MV Vmax:       0.90 m/s MV Vmean:      54.9 cm/s MV Decel Time: 160 msec MV E velocity: 96.00 cm/s MV A velocity: 67.70 cm/s MV E/A ratio:  1.42 Kathlyn Sacramento MD Electronically signed by Kathlyn Sacramento MD Signature Date/Time: 09/15/2021/9:42:39 AM    Final    US THYROID  Result Date: 09/04/2021 CLINICAL  DATA:  Hypercalcemia EXAM: THYROID ULTRASOUND TECHNIQUE: Ultrasound examination of the thyroid gland and adjacent soft tissues was performed. COMPARISON:  None. FINDINGS: Parenchymal Echotexture: Normal Isthmus: 0.5 cm Right lobe: 4.8 x 2.1 x 2.3 cm Left lobe: 5.8 x 1.5 x 1.5 cm _________________________________________________________ Estimated total number of nodules >/= 1 cm: 0 Number of spongiform nodules >/=  2 cm not described below (TR1): 0 Number of mixed cystic and solid nodules >/= 1.5 cm not described below (TR2): 0 _________________________________________________________ 1.5 x 1.3 x 1.3 cm hypoechoic nodule seen inferior and posterior to the inferior tip of the left thyroid lobe is suspicious for a parathyroid adenoma. IMPRESSION: 1. No significant sonographic abnormality of the thyroid. 2. 1.5 x 1.3 x 1.3 cm hypoechoic mass seen inferior and posterior to the inferior tip of the left thyroid lobe is suspicious for a parathyroid adenoma. The above is in keeping with the ACR TI-RADS recommendations - J Am Coll Radiol 2017;14:587-595. Electronically Signed   By: Miachel Roux M.D.   On: 09/20/2021 14:51   [place] EKG SITE RITE  Result Date: 09/11/2021 If Site Rite image not attached, placement could not be confirmed due to current cardiac rhythm.   Microbiology Recent Results (from the past 240 hour(s))  Urine Culture     Status: None   Collection Time: 09/10/2021  6:48 AM   Specimen: Urine, Random  Result Value Ref Range Status   Specimen Description   Final    URINE, RANDOM Performed at [facility],  [place], [place] [address]    Special Requests   Final    NONE Performed at [facility], [address], [place], [place] [address]    Culture   Final    NO GROWTH Performed at [facility], [place] [address], [place], [place] [address]    Report Status 09/14/2021 FINAL  Final  Culture, blood (routine x 2)     Status: Abnormal   Collection Time:  09/08/2021  6:58 AM   Specimen: BLOOD  Result Value Ref Range Status   Specimen Description   Final    BLOOD LEFT FA Performed at [facility], [address] [place], [place] [address]    Special Requests   Final    BOTTLES DRAWN AEROBIC AND ANAEROBIC Blood Culture adequate volume Performed at [facility], [address] [place], [place] [address]    Culture  Setup Time   Final    GRAM POSITIVE RODS ANAEROBIC BOTTLE ONLY CRITICAL RESULT CALLED TO, READ BACK BY AND VERIFIED WITH: [email or web address] [email or web address]  09/10/21 RH Performed at [facility], [address] [place], [place] [address]    Culture (A)  Final    CLOSTRIDIUM SPECIES Standardized susceptibility testing for this organism is not available. Performed at [facility], [place] [address], [place], [place] [address]    Report Status 05-Oct-2021 FINAL  Final  Culture, blood (routine x 2)     Status: None   Collection Time: 09/06/2021  6:59 AM   Specimen: BLOOD  Result Value Ref Range Status   Specimen Description BLOOD RIGHT FA  Final   Special Requests   Final    BOTTLES DRAWN AEROBIC AND ANAEROBIC Blood Culture adequate volume   Culture   Final    NO GROWTH 5 DAYS Performed at [facility], [address] [place], [place] [address]    Report Status 09/18/2021 FINAL  Final  Resp Panel by RT-PCR (Flu A&B, Covid) Nasopharyngeal Swab     Status: None   Collection Time: 09/03/2021  7:44 AM   Specimen: Nasopharyngeal Swab; Nasopharyngeal(NP) swabs in vial transport medium  Result Value Ref Range Status   SARS Coronavirus 2 by RT PCR NEGATIVE NEGATIVE Final    Comment: (NOTE) SARS-CoV-2 target nucleic acids are NOT DETECTED.  The SARS-CoV-2 RNA is generally detectable in upper respiratory specimens during the acute phase of infection. The lowest concentration of SARS-CoV-2 viral copies this assay can detect is 138 copies/mL. A negative result does not preclude SARS-Cov-2 infection and  should not be used as the sole basis for treatment or other patient management decisions. A negative result may occur with  improper specimen collection/handling, submission of specimen other than nasopharyngeal swab, presence of viral mutation(s) within the areas targeted by this assay, and inadequate number of viral copies(<138 copies/mL). A negative result must be combined with clinical observations, patient history, and epidemiological information. The expected result is Negative.  Fact Sheet for Patients:  EntrepreneurPulse.com.au  Fact Sheet for Healthcare Providers:  [employer]  This test is no t yet approved or cleared by the [place] FDA and  has been authorized for detection and/or diagnosis of SARS-CoV-2 by FDA under an Emergency Use Authorization (EUA). This EUA will remain  in effect (meaning this test can be used) for the duration of the COVID-19 declaration under Section 564(b)(1) of the Act, 21 U.S.C.section 360bbb-3(b)(1), unless the authorization is terminated  or revoked sooner.  Influenza A by PCR NEGATIVE NEGATIVE Final   Influenza B by PCR NEGATIVE NEGATIVE Final    Comment: (NOTE) The Xpert Xpress SARS-CoV-2/FLU/RSV plus assay is intended as an aid in the diagnosis of influenza from Nasopharyngeal swab specimens and should not be used as a sole basis for treatment. Nasal washings and aspirates are unacceptable for Xpert Xpress SARS-CoV-2/FLU/RSV testing.  Fact Sheet for Patients: EntrepreneurPulse.com.au  Fact Sheet for Healthcare Providers: [employer]  This test is not yet approved or cleared by the [place] FDA and has been authorized for detection and/or diagnosis of SARS-CoV-2 by FDA under an Emergency Use Authorization (EUA). This EUA will remain in effect (meaning this test can be used) for the duration of the COVID-19 declaration  under Section 564(b)(1) of the Act, 21 U.S.C. section 360bbb-3(b)(1), unless the authorization is terminated or revoked.  Performed at [facility], [address] [place], [place] [address]   Aerobic/Anaerobic Culture w Gram Stain (surgical/deep wound)     Status: None   Collection Time: 09/10/2021  8:16 AM   Specimen: Wound  Result Value Ref Range Status   Specimen Description   Final    WOUND Performed at [facility], [address] [place], [place] [address]    Special Requests RIGHT FOOT  Final   Gram Stain   Final    MODERATE WBC PRESENT, PREDOMINANTLY MONONUCLEAR MODERATE GRAM POSITIVE COCCI IN PAIRS AND CHAINS MODERATE GRAM NEGATIVE RODS    Culture   Final    RARE STAPHYLOCOCCUS AUREUS WITHIN MIXED ORGANISMS ABUNDANT BACTEROIDES PYOGENES CORRECTED ON 12/14 AT 1413: PREVIOUSLY REPORTED AS MIXED ANAEROBIC FLORA PRESENT.  CALL LAB IF FURTHER IID REQUIRED. BETA LACTAMASE POSITIVE Performed at [facility], [place] [address], [place], [place] [address]    Report Status 09/22/2021 FINAL  Final   Organism ID, Bacteria STAPHYLOCOCCUS AUREUS  Final      Susceptibility   Staphylococcus aureus - MIC*    CIPROFLOXACIN <=0.5 SENSITIVE Sensitive     ERYTHROMYCIN >=8 RESISTANT Resistant     GENTAMICIN <=0.5 SENSITIVE Sensitive     OXACILLIN 0.5 SENSITIVE Sensitive     TETRACYCLINE >=16 RESISTANT Resistant     VANCOMYCIN <=0.5 SENSITIVE Sensitive     TRIMETH/SULFA <=10 SENSITIVE Sensitive     CLINDAMYCIN <=0.25 SENSITIVE Sensitive     RIFAMPIN <=0.5 SENSITIVE Sensitive     Inducible Clindamycin NEGATIVE Sensitive     * RARE STAPHYLOCOCCUS AUREUS  MRSA Next Gen by PCR, Nasal     Status: None   Collection Time: 09/15/2021  6:58 PM   Specimen: Nasal Mucosa; Nasal Swab  Result Value Ref Range Status   MRSA by PCR Next Gen NOT DETECTED NOT DETECTED Final    Comment: (NOTE) The GeneXpert MRSA Assay (FDA approved for NASAL specimens only), is one component of  a comprehensive MRSA colonization surveillance program. It is not intended to diagnose MRSA infection nor to guide or monitor treatment for MRSA infections. Test performance is not FDA approved in patients less than 66 years old. Performed at [facility], [address] [place], [place] [address]     Lab Basic Metabolic Panel: Recent Labs  Lab 09/10/21 0402 09/11/21 0341 09/11/21 1144 08/30/2021 0512 09/22/21 0339  NA 137 139 142 143 141  K 3.6 2.8* 3.2* 3.6 4.0  CL 103 106 107 108 111  CO2 31 30 27 31 27   GLUCOSE 138* 210* 292* 203* 277*  BUN 19 22* 24* 28* 34*  CREATININE 0.82 0.76 0.78 0.81 1.06  CALCIUM 13.0* 11.2* 11.2* 10.1 9.5  MG 2.1 1.8 1.8 1.7 1.6*  PHOS 2.1* <1.0* <1.0*   <1.0* 1.7* 2.2*   Liver Function Tests: Recent Labs  Lab 08/30/2021 0658 09/10/21 0402 09/11/21 1144  AST 33 39  --   ALT 23 19  --   ALKPHOS 94 78  --   BILITOT 3.1* 1.3*  --   PROT 6.4* 5.7*  --   ALBUMIN 2.6* 1.8* 2.5*   No results for input(s): LIPASE, AMYLASE in the last 168 hours. Recent Labs  Lab 09/10/21 0402  AMMONIA 14   CBC: Recent Labs  Lab 09/01/2021 0658 09/08/21 0616 09/17/2021 0259 09/10/21 0402 09/18/2021 0505  WBC 11.2* 12.4* 8.2 7.2 14.0*  NEUTROABS 10.6*  --   --   --   --   HGB 14.4 12.6* 11.9* 12.3* 10.5*  HCT 41.1 36.2* 34.9* 36.5* 32.1*  MCV 88.6 87.7 89.3 92.2 95.5  PLT 185 164 153 151 234   Cardiac Enzymes: No results for input(s): CKTOTAL, CKMB, CKMBINDEX, TROPONINI in the last 168 hours. Sepsis Labs: Recent Labs  Lab 08/30/2021 0658 09/17/2021 0912 09/19/2021 1206 09/28/2021 1444 09/08/21 0616 09/14/2021 0259 08/31/2021 1935 09/10/21 0402 09/25/2021 0505  PROCALCITON 15.80  --   --   --  8.58  --   --   --   --   WBC 11.2*  --   --   --  12.4* 8.2  --  7.2 14.0*  LATICACIDVEN 2.9* 2.9* 2.8* 2.8*  --   --  1.3  --   --     Procedures/Operations  09/22/2021: Guillotine Right BKA 95/63/87: Right Basilic PICC line placement 08/30/2021:  Endotracheal intubation 09/20/2021: Arterial line placement 09/28/2021: I&D of previous guillotine amputation of right BKA        Bradly Bienenstock October 08, 2021, 10:00 PM  Darel Hong, AGACNP-BC [facility] Prefer epic messenger for cross cover needs If after hours, please call E-link

## 2021-09-29 NOTE — Progress Notes (Signed)
Spoke with [employer] representative. Patient is a registered donor and is not to be extubated/care withdrawn prior to clearance from [employer]. His case will be evaluated and they will notify this RN of his eligibility. Will continue to monitor patient and manage care.

## 2021-09-29 NOTE — Progress Notes (Signed)
(Late entry) NAME:  [name], MRN:  [id number], DOB:  11-27-1965, LOS: 6 ADMISSION DATE:  09/17/2021, CONSULTATION DATE:  09/19/2021 REFERRING MD:  Dr. Kurtis Bushman, CHIEF COMPLAINT:   Rt Foot pain  Pertinent  Medical History  T2DM HTN ETOH Dyslipidemia TMA of the RIGHT foot 05/14/21 Brief HPI:  56 year old male presenting to [facility] ED on 09/23/2021 with complaints of feeling tired over the past day in the setting of of a wound on the lateral aspect of his right foot with foul-smelling drainage and increased pain. 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  Post surgery he followed up with the podiatrist once on 07/09/2021 at which time he had a 2 x 2 full-thickness ulcer without signs of infection. Upon arrival patient was tachycardic in A-fib RVR, tachypneic with elevated lactic acid and procalcitonin levels> he was worked up per sepsis protocol with IV fluid and empiric antibiotic therapy.  There was also concern for possible NSTEMI and a heparin drip was started.   Hospital course: Podiatry consulted whose recommendations were vascular surgical intervention for potential amputation of RLE due to the concern for osteomyelitis & x-rays revealing soft tissue gas. Vascular surgery recommended right trans tibial amputation, which was performed 09/08/2021. Cardiology following due to N-STEMI, HFrEF and PAF on heparin drip with plans for ischemic work-up once stabilized from surgery & infection. Nephrology consulted for guidance on hypercalcemia, and ID following to assist with treatment of R ft infection. Patient continues to be altered in the setting of ETOH use & infection. [place] 09/08/21: negative for acute abnormality. Post-op the patient was lethargic & hypotensive after receiving morphine & ativan and transferred to SDU.  PCCM later consulted to assist with management and monitoring.  Significant Hospital Events: Including procedures, antibiotic start and stop dates in  addition to other pertinent events   09/06/2021: Admitted by [facility] with sepsis s/t R infection/ N-STEMI/ A-fib RVR with X-ray showing soft tissue gas. Podiatry & vascular consulted 09/08/21: Pt confused, CTH negative, CIWA initiated. Cardiology consulted for management of N-STEMI & A-fib RVR with plans for ischemic w/u after vascular intervention and pt stabilization 09/02/2021: status post guillotine R BKA, in post op patient lethargic & hypotensive transferred to SDU. PCCM consulted.  Interim History / Subjective:  Patient similar to post-op description, somnolent responsive to pain & physical stimuli, moving all extremities with some groaning. VSS.  Objective   Blood pressure (!) 106/59, pulse 73, temperature (!) 97.5 F (36.4 C), temperature source Axillary, resp. rate (!) 0, height 6\' 3"  (1.905 m), weight 94.8 kg, SpO2 100 %.    Vent Mode: PRVC FiO2 (%):  [35 %-40 %] 35 % Set Rate:  [20 bmp] 20 bmp Vt Set:  [500 mL] 500 mL PEEP:  [5 cmH20] 5 cmH20 Plateau Pressure:  [15 cmH20] 15 cmH20   Intake/Output Summary (Last 24 hours) at October 10, 2021 1338 Last data filed at Oct 10, 2021 1303 Gross per 24 hour  Intake 4166.79 ml  Output 2600 ml  Net 1566.79 ml    Filed Weights   09/11/2021 0648 09/24/2021 1848  Weight: 93 kg 94.8 kg   Physical Exam Vitals reviewed.  Constitutional:      General: He is not in acute distress.    Appearance: He is obese. He is ill-appearing.     Interventions: He is not intubated. HENT:     Head: Normocephalic and atraumatic.     Right Ear: External ear normal.     Left Ear: External  ear normal.     Mouth/Throat:     Mouth: Mucous membranes are moist.     Pharynx: No posterior oropharyngeal erythema.  Eyes:     General: No scleral icterus.    Extraocular Movements: Extraocular movements intact.     Pupils: Pupils are equal, round, and reactive to light.  Cardiovascular:     Rate and Rhythm: Tachycardia present. Occasional Extrasystoles are present.     Pulses: Normal pulses.     Heart sounds: Normal heart sounds.  Pulmonary:     Effort: No accessory muscle usage, respiratory distress or retractions. He is not intubated.     Breath sounds: Examination of the right-lower field reveals decreased breath sounds. Examination of the left-lower field reveals decreased breath sounds. Decreased breath sounds present. No wheezing, rhonchi or rales.  Abdominal:     General: Abdomen is protuberant. Bowel sounds are normal. There is no distension.     Palpations: Abdomen is soft.     Tenderness: There is no abdominal tenderness. There is no guarding or rebound.  Musculoskeletal:     Left lower leg: Edema present.     Comments: RIGHT BKA, post op dressing in place  Neurological:     Mental Status: He is lethargic.     GCS: GCS eye subscore is 3. GCS verbal subscore is 2. GCS motor subscore is 5.     ASSESSMENT & PLAN   RESP: -Intubated overnight for airway protection  -Initial CXR clear lung fields -Careful volume reduction  -Maintain PEEP:FiO2  -Maintain LPVS -DTs preclude sedation halt, but wean MV as able  NEURO: Acute Encephalopathy secondary to suspected withdrawal in the setting of ETOH use & infection CTH 12/11 negative, STAT ABG: 7.49/40/69/30.5, Lactic: 1.3 -MRI confounded by motion, may need to repeat pending clinical course - Exclude CIWA, AMS may be multi-factorial  - Replaced CIWA  with standing low dose of Valium and monitor - Folic acid, multi-vitamin daily - high dose thiamine taper - limit sedating medications as tolerated, consider precedex if patient becomes agitated - seizure & fall precautions - NGT placed to facilitate PO medications - HIGH RISK for intubation > currently protecting airway - Ammonia level 14/WNL -RASS goal -2 while requiring sedatives for MV, OK to maintain precedex   ID: Sepsis without septic shock secondary to acute osteomyelitis in the setting of infected diabetic foot ulcer s/p guillotine  BKA Lab Results  Component Value Date   WBC 14.0 (H) 09/14/2021   Temp (24hrs), Avg:100.2 F (37.9 C), Min:97.4 F (36.3 C), Max:103.1 F (39.5 C)  Lactic: 1.3  Specimen Description WOUND  Performed at [facility], [address] [place], [place] [address]   Special Requests RIGHT FOOT   Gram Stain MODERATE WBC PRESENT, PREDOMINANTLY MONONUCLEAR  MODERATE GRAM POSITIVE COCCI IN PAIRS AND CHAINS  MODERATE GRAM NEGATIVE RODS  Performed at [facility], [place] [address], [place], [place] [address]   Culture RARE STAPHYLOCOCCUS AUREUS  SUSCEPTIBILITIES TO FOLLOW  WITHIN MIXED ORGANISMS  MIXED ANAEROBIC FLORA PRESENT.  CALL LAB IF FURTHER IID REQUIRED.   -Wash out of wound today, has been in OR, note entry delayed - Daily CBC, monitor WBC/ fever curve, lactic acid WNL - IV antibiotics: zosyn - Strict I/O's: alert provider if UOP < 0.5 mL/kg/hr - ID consulted, appreciate input - vascular following, appreciate input  CV: ECHO 09/06/2021 IMPRESSIONS:  1. Left ventricular ejection fraction, by estimation, is 30 to 35%.       Regional  wall motion abnormalities and is mildly dilated.         -there is severe hypokinesis of the left ventricular,         -mid-apical anteroseptal wall and anterior wall.          -akinesis of the left ventricular, apical segment.             There is mild left ventricular hypertrophy.        Left ventricular diastolic parameters are indeterminate.    2. Right ventricular systolic function is normal.        The right ventricular  size is normal.    3. Left atrial size was moderately dilated.    4. No significant valvular dysfunction noted   HFrEF/ Presumed ICM- LVEF 30-35%, severe hypokinesis N-Stemi- troponin peak 5943 PAF - currently NSR - continue metoprolol, statin, ASA via NGT - if A-fib recurs consider amiodarone - continuous cardiac monitoring - Cardiology consulted, appreciate input > plan for ischemic w/u and cath once  stabilized  RENAL/FEN: Lab Results  Component Value Date   CREATININE 1.06 Oct 12, 2021   BUN 34 (H) Oct 12, 2021   NA 141 10/12/21   K 4.0 10/12/2021   CL 111 12-Oct-2021   CO2 27 October 12, 2021      Intake/Output Summary (Last 24 hours) at 10/12/21 1338 Last data filed at Oct 12, 2021 1303 Gross per 24 hour  Intake 4166.79 ml  Output 2600 ml  Net 1566.79 ml    Net IO Since Admission: 11,911.09 mL [2021/10/12 1338]  Hypercalcemia s/t Hyperparathyroidism 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 No significant sonographic abnormality of the thyroid. 2. 1.5 x 1.3 x 1.3 cm hypoechoic mass seen inferior and posterior to the inferior tip of the left thyroid lobe is suspicious for a parathyroid adenoma.  - Sensipar started, only 1 dose received due to lethargy, unable to give via NGT > Rx looking into alternative options for NGT administration - Would force diurese at this time with lasix dosing to account for IVF for mild positive balance - Nephrology following, appreciate input -Will volume reduce today, lasix eval output/renal indices  ENDO: Uncontrolled Type 2 Diabetes Mellitus Hemoglobin A1C: 8.2 - Monitor CBG Q 4 hours while NPO - SSI on hold while PO intake poor: CBG's 100-150 (12/12) - target range while in ICU: 140-180 - follow ICU hyper/hypo-glycemia protocol  Best Practice (right click and "Reselect all SmartList Selections" daily)  Diet/type: NPO w/ meds via tube DVT prophylaxis: LMWH GI prophylaxis: PPI Lines: N/A Foley:  N/A Code Status:  full code Last date of multidisciplinary goals of care discussion [09/18/2021]  Labs   CBC: Recent Labs  Lab 09/10/2021 0658 09/08/21 0616 09/08/2021 0259 09/10/21 0402 09/02/2021 0505  WBC 11.2* 12.4* 8.2 7.2 14.0*  NEUTROABS 10.6*  --   --   --   --   HGB 14.4 12.6* 11.9* 12.3* 10.5*  HCT 41.1 36.2* 34.9* 36.5* 32.1*  MCV 88.6 87.7 89.3 92.2 95.5  PLT 185 164 153 151 234     Basic Metabolic  Panel: Recent Labs  Lab 09/10/21 0402 09/11/21 0341 09/11/21 1144 09/06/2021 0512 10-12-2021 0339  NA 137 139 142 143 141  K 3.6 2.8* 3.2* 3.6 4.0  CL 103 106 107 108 111  CO2 31 30 27 31 27   GLUCOSE 138* 210* 292* 203* 277*  BUN 19 22* 24* 28* 34*  CREATININE 0.82 0.76 0.78 0.81 1.06  CALCIUM 13.0* 11.2* 11.2* 10.1 9.5  MG 2.1 1.8 1.8  1.7 1.6*  PHOS 2.1* <1.0* <1.0*   <1.0* 1.7* 2.2*    GFR: Estimated Creatinine Clearance: 94.1 mL/min (by C-G formula based on SCr of 1.06 mg/dL). Recent Labs  Lab 09/08/2021 0658 09/17/2021 0912 09/01/2021 1206 09/24/2021 1444 09/08/21 0616 09/08/2021 0259 09/08/2021 1935 09/10/21 0402 09/19/2021 0505  PROCALCITON 15.80  --   --   --  8.58  --   --   --   --   WBC 11.2*  --   --   --  12.4* 8.2  --  7.2 14.0*  LATICACIDVEN 2.9* 2.9* 2.8* 2.8*  --   --  1.3  --   --      Liver Function Tests: Recent Labs  Lab 09/06/2021 0658 09/10/21 0402 09/11/21 1144  AST 33 39  --   ALT 23 19  --   ALKPHOS 94 78  --   BILITOT 3.1* 1.3*  --   PROT 6.4* 5.7*  --   ALBUMIN 2.6* 1.8* 2.5*    No results for input(s): LIPASE, AMYLASE in the last 168 hours. Recent Labs  Lab 09/10/21 0402  AMMONIA 14     ABG    Component Value Date/Time   PHART 7.45 09/03/2021 0500   PCO2ART 46 09/25/2021 0500   PO2ART 77 (L) 09/10/2021 0500   HCO3 32.0 (H) 09/08/2021 0500   O2SAT 95.8 09/14/2021 0500      Coagulation Profile: Recent Labs  Lab 09/16/2021 0659 09/23/2021 0259 09/05/2021 0512  INR 1.2 1.6* 1.6*     Cardiac Enzymes: No results for input(s): CKTOTAL, CKMB, CKMBINDEX, TROPONINI in the last 168 hours.  HbA1C: Hgb A1c MFr Bld  Date/Time Value Ref Range Status  09/22/2021 06:58 AM 8.2 (H) 4.8 - 5.6 % Final    Comment:    (NOTE)         Prediabetes: 5.7 - 6.4         Diabetes: >6.4         Glycemic control for adults with diabetes: <7.0   05/10/2021 05:00 AM 6.4 (H) 4.8 - 5.6 % Final    Comment:    (NOTE) Pre diabetes:           5.7%-6.4%  Diabetes:              >6.4%  Glycemic control for   <7.0% adults with diabetes     CBG: Recent Labs  Lab 09/06/2021 1942 09/05/2021 2306 10/12/2021 0330 2021/10/12 0721 October 12, 2021 1135  GLUCAP 156* 164* 207* 273* 385*     Review of Systems:   UTA- patient somnolent unable to participate in interview  Past Medical History:  He,  has a past medical history of Diabetes ([place]) and Hypertension.   Surgical History:   Past Surgical History:  Procedure Laterality Date   AMPUTATION Right 09/18/2021   Procedure: RIGHT OPEN GUILLOTINE BELOW KNEE AMPUTATION;  Surgeon: Algernon Huxley, MD;  Location: [facility] ORS;  Service: Vascular;  Laterality: Right;   AMPUTATION TOE Right 07/11/2020   Procedure: AMPUTATION TOE-Right Great Toe;  Surgeon: Samara Deist, DPM;  Location: [facility] OR[facility];  Service: Podiatry;  Laterality: Right;   APPLICATION OF WOUND VAC Right 09/02/2021   Procedure: APPLICATION OF WOUND VAC;  Surgeon: Algernon Huxley, MD;  Location: [facility] OR[facility];  Service: Vascular;  Laterality: Right;   I & D EXTREMITY Right 05/14/2021   Procedure: IRRIGATION AND DEBRIDEMENT EXTREMITY- RIGHT FOOT;  Surgeon: Samara Deist, DPM;  Location: [facility] OR[facility];  Service: Podiatry;  Laterality: Right;  INCISION AND DRAINAGE Right 09/21/2021   Procedure: INCISION AND DRAINAGE;  Surgeon: Algernon Huxley, MD;  Location: [facility] OR[facility];  Service: Vascular;  Laterality: Right;   IRRIGATION AND DEBRIDEMENT ABSCESS Right 10/31/2018   Procedure: IRRIGATION AND DEBRIDEMENT ABSCESS;  Surgeon: Samara Deist, DPM;  Location: [facility] OR[facility];  Service: Podiatry;  Laterality: Right;   IRRIGATION AND DEBRIDEMENT FOOT Right 11/02/2018   Procedure: IRRIGATION AND DEBRIDEMENT FOOT;  Surgeon: Sharlotte Alamo, DPM;  Location: [facility];  Service: Podiatry;  Laterality: Right;   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   Procedure: TRANSMETATARSAL AMPUTATION;  Surgeon: Samara Deist, DPM;  Location: [facility];  Service: Podiatry;  Laterality: Right;      Social History:   reports that he has been smoking cigars. He has never used smokeless tobacco. He reports current alcohol use. He reports that he does not use drugs.   Family History:  His family history includes CAD in his brother and father.   Allergies No Known Allergies   Home Medications  Prior to Admission medications   Medication Sig Start Date End Date Taking? Authorizing Provider  acetaminophen (TYLENOL) 325 MG tablet Take 2 tablets (650 mg total) by mouth every 6 (six) hours as needed for mild pain (or Fever >/= 101). 05/22/21  Yes Arrien, Jimmy Picket, MD  oxyCODONE (OXY IR/ROXICODONE) 5 MG immediate release tablet Take 1 tablet (5 mg total) by mouth every 6 (six) hours as needed for moderate pain. 05/22/21  Yes Arrien, Jimmy Picket, MD  amoxicillin-clavulanate (AUGMENTIN) 875-125 MG tablet Take 1 tablet by mouth 2 (two) times daily  for 14 days. Patient not taking: Reported on 09/02/2021 06/13/21   Tsosie Billing, MD  atorvastatin (LIPITOR) 40 MG tablet Take 40 mg by mouth daily.    [provider]  glipiZIDE (GLUCOTROL XL) 10 MG 24 hr tablet Take 20 mg by mouth daily with breakfast. Patient not taking: Reported on 09/14/2021    [provider]     Critical Care Time devoted to patient care services described in this note is ... minutes.  Overall, patient is critically ill, prognosis is guarded.   Patient with Multiorgan failure and at high risk for cardiac arrest and death.  Images reviewed directly and interpretation in A&P is my own unless noted Labs reviewed and evaluated as noted in A&P  Critical care time: 45 minutes   //Brenda Cowher

## 2021-09-29 NOTE — Progress Notes (Signed)
Called to provide support to family as patient was extubated. Sister understandably emotional, requested prayer. She wanted to remain bedside during the extubation procedure, I remained presence for support.

## 2021-09-29 NOTE — Progress Notes (Signed)
Inpatient Diabetes Program Recommendations  AACE/ADA: New Consensus Statement on Inpatient Glycemic Control (2015)  Target Ranges:  Prepandial:   less than 140 mg/dL      Peak postprandial:   less than 180 mg/dL (1-2 hours)      Critically ill patients:  140 - 180 mg/dL   Lab Results  Component Value Date   GLUCAP 273 (H) 10/12/2021   HGBA1C 8.2 (H) 09/03/2021    Review of Glycemic Control  Latest Reference Range & Units 09/15/2021 19:42 08/30/2021 23:06 12-Oct-2021 03:30 Oct 12, 2021 07:21  Glucose-Capillary 70 - 99 mg/dL 156 (H) 164 (H) 207 (H) 273 (H)   Diabetes history: DM 2 Outpatient Diabetes medications:  Glucotrol (patient states not taking) Current orders for Inpatient glycemic control:  Novolog moderate q 4 hours Novolog 2 units q 4 hours Vital 60 cc/hr Solucortef 100 mg q 8 hours Inpatient Diabetes Program Recommendations:    May consider increasing Novolog to 4 units q 4 hours and add Levemir 5 units bid .  Thanks,  Adah Perl, RN, BC-ADM Inpatient Diabetes Coordinator Pager [phone]  (8a-5p)

## 2021-09-29 NOTE — Progress Notes (Signed)
Pt extubated to comfort care.  

## 2021-09-29 NOTE — Progress Notes (Signed)
Spoke with Lissa Merlin, [employer] family support coordinator, patient's case is still in process to see if he is an Museum/gallery exhibitions officer for donating. Her ETA is 60 minutes.

## 2021-09-29 NOTE — Progress Notes (Signed)
Call put in to [facility] for guidance on sedation transition and an estimated time on the decision to continue on with comfort care measures/donor candidacy.

## 2021-09-29 NOTE — Progress Notes (Signed)
(Late entry) NAME:  [name], MRN:  [id number], DOB:  12-08-1965, LOS: 6 ADMISSION DATE:  09/20/2021, CONSULTATION DATE:  08/30/2021 REFERRING MD:  Dr. Kurtis Bushman, CHIEF COMPLAINT:   Rt Foot pain  Pertinent  Medical History  T2DM HTN ETOH Dyslipidemia TMA of the RIGHT foot 05/14/21 Brief HPI:  56 year old male presenting to [facility] ED on 08/29/2021 with complaints of feeling tired over the past day in the setting of of a wound on the lateral aspect of his right foot with foul-smelling drainage and increased pain. 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  Post surgery he followed up with the podiatrist once on 07/09/2021 at which time he had a 2 x 2 full-thickness ulcer without signs of infection. Upon arrival patient was tachycardic in A-fib RVR, tachypneic with elevated lactic acid and procalcitonin levels> he was worked up per sepsis protocol with IV fluid and empiric antibiotic therapy.  There was also concern for possible NSTEMI and a heparin drip was started.   Hospital course: Podiatry consulted whose recommendations were vascular surgical intervention for potential amputation of RLE due to the concern for osteomyelitis & x-rays revealing soft tissue gas. Vascular surgery recommended right trans tibial amputation, which was performed 08/30/2021. Cardiology following due to N-STEMI, HFrEF and PAF on heparin drip with plans for ischemic work-up once stabilized from surgery & infection. Nephrology consulted for guidance on hypercalcemia, and ID following to assist with treatment of R ft infection. Patient continues to be altered in the setting of ETOH use & infection. [place] 09/08/21: negative for acute abnormality. Post-op the patient was lethargic & hypotensive after receiving morphine & ativan and transferred to SDU.  PCCM later consulted to assist with management and monitoring.  Significant Hospital Events: Including procedures, antibiotic start and stop dates in  addition to other pertinent events   09/02/2021: Admitted by [facility] with sepsis s/t R infection/ N-STEMI/ A-fib RVR with X-ray showing soft tissue gas. Podiatry & vascular consulted 09/08/21: Pt confused, CTH negative, CIWA initiated. Cardiology consulted for management of N-STEMI & A-fib RVR with plans for ischemic w/u after vascular intervention and pt stabilization 09/22/2021: status post guillotine R BKA, in post op patient lethargic & hypotensive transferred to SDU. PCCM consulted.  Interim History / Subjective:  Patient similar to post-op description, somnolent responsive to pain & physical stimuli, moving all extremities with some groaning. VSS.  Objective   Blood pressure (!) 106/59, pulse 73, temperature (!) 97.5 F (36.4 C), temperature source Axillary, resp. rate (!) 0, height 6\' 3"  (1.905 m), weight 94.8 kg, SpO2 100 %.    Vent Mode: PRVC FiO2 (%):  [35 %-40 %] 35 % Set Rate:  [20 bmp] 20 bmp Vt Set:  [500 mL] 500 mL PEEP:  [5 cmH20] 5 cmH20 Plateau Pressure:  [15 cmH20] 15 cmH20   Intake/Output Summary (Last 24 hours) at 10-07-21 1343 Last data filed at 10/07/2021 1303 Gross per 24 hour  Intake 4166.79 ml  Output 2600 ml  Net 1566.79 ml    Filed Weights   08/30/2021 0648 09/28/2021 1848  Weight: 93 kg 94.8 kg   Physical Exam Vitals reviewed.  Constitutional:      General: He is not in acute distress.    Appearance: He is obese. He is ill-appearing.     Interventions: He is not intubated. HENT:     Head: Normocephalic and atraumatic.     Right Ear: External ear normal.     Left Ear: External  ear normal.     Mouth/Throat:     Mouth: Mucous membranes are moist.     Pharynx: No posterior oropharyngeal erythema.  Eyes:     General: No scleral icterus.    Extraocular Movements: Extraocular movements intact.     Pupils: Pupils are equal, round, and reactive to light.  Cardiovascular:     Rate and Rhythm: Tachycardia present. Occasional Extrasystoles are present.     Pulses: Normal pulses.     Heart sounds: Normal heart sounds.  Pulmonary:     Effort: No accessory muscle usage, respiratory distress or retractions. He is not intubated.     Breath sounds: Examination of the right-lower field reveals decreased breath sounds. Examination of the left-lower field reveals decreased breath sounds. Decreased breath sounds present. No wheezing, rhonchi or rales.  Abdominal:     General: Abdomen is protuberant. Bowel sounds are normal. There is no distension.     Palpations: Abdomen is soft.     Tenderness: There is no abdominal tenderness. There is no guarding or rebound.  Musculoskeletal:     Left lower leg: Edema present.     Comments: RIGHT BKA, post op dressing in place  Neurological:     Mental Status: He is lethargic.     GCS: GCS eye subscore is 3. GCS verbal subscore is 2. GCS motor subscore is 5.     ASSESSMENT & PLAN   RESP: -Intubated overnight for airway protection  -Initial CXR clear lung fields -Careful volume reduction  -Maintain PEEP:FiO2  -Maintain LPVS -DTs preclude sedation halt, but wean MV as able -Palliative working with family for potential compassionate extubation   NEURO: Acute Encephalopathy secondary to suspected withdrawal in the setting of ETOH use & infection CTH 12/11 negative, STAT ABG: 7.49/40/69/30.5, Lactic: 1.3 -MRI confounded by motion, may need to repeat pending clinical course - Exclude CIWA, AMS may be multi-factorial  - Replaced CIWA  with standing low dose of Valium and monitor - Folic acid, multi-vitamin daily - high dose thiamine taper - limit sedating medications as tolerated, consider precedex if patient becomes agitated - seizure & fall precautions - NGT placed to facilitate PO medications - HIGH RISK for intubation > currently protecting airway - Ammonia level 14/WNL -RASS goal -2 while requiring sedatives for MV, OK to maintain precedex  -Will defer to palliative for analgesia and sedation at time  of terminal extubation   ID: Sepsis without septic shock secondary to acute osteomyelitis in the setting of 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 Lab Results  Component Value Date   WBC 14.0 (H) 09/28/2021   Temp (24hrs), Avg:100.2 F (37.9 C), Min:97.4 F (36.3 C), Max:103.1 F (39.5 C)  Lactic: 1.3  Specimen Description WOUND  Performed at [facility], [address] [place], [place] [address]   Special Requests RIGHT FOOT   Gram Stain MODERATE WBC PRESENT, PREDOMINANTLY MONONUCLEAR  MODERATE GRAM POSITIVE COCCI IN PAIRS AND CHAINS  MODERATE GRAM NEGATIVE RODS  Performed at [facility], [place] [address], [place], [place] [address]   Culture RARE STAPHYLOCOCCUS AUREUS  SUSCEPTIBILITIES TO FOLLOW  WITHIN MIXED ORGANISMS  MIXED ANAEROBIC FLORA PRESENT.  CALL LAB IF FURTHER IID REQUIRED.   -Wash out of wound today, has been in OR, note entry delayed - Daily CBC, monitor WBC/ fever curve, lactic acid WNL - IV antibiotics: zosyn - Strict I/O's: alert provider if UOP < 0.5 mL/kg/hr - ID consulted, appreciate input - vascular following, appreciate input -Status  quo at this point, pending palliation   CV: ECHO 09/06/2021 IMPRESSIONS:  1. Left ventricular ejection fraction, by estimation, is 30 to 35%.       Regional wall motion abnormalities and is mildly dilated.         -there is severe hypokinesis of the left ventricular,         -mid-apical anteroseptal wall and anterior wall.          -akinesis of the left ventricular, apical segment.             There is mild left ventricular hypertrophy.        Left ventricular diastolic parameters are indeterminate.    2. Right ventricular systolic function is normal.        The right ventricular  size is normal.    3. Left atrial size was moderately dilated.    4. No significant valvular dysfunction noted   HFrEF/ Presumed ICM- LVEF 30-35%, severe hypokinesis N-Stemi- troponin peak 5943 PAF - currently  NSR - continue metoprolol, statin, ASA via NGT - if A-fib recurs consider amiodarone - continuous cardiac monitoring - Cardiology consulted, appreciate input > plan for ischemic w/u and cath once stabilized  RENAL/FEN: Lab Results  Component Value Date   CREATININE 1.06 2021/09/17   BUN 34 (H) Sep 17, 2021   NA 141 2021-09-17   K 4.0 17-Sep-2021   CL 111 Sep 17, 2021   CO2 27 09/17/2021      Intake/Output Summary (Last 24 hours) at 17-Sep-2021 1343 Last data filed at 17-Sep-2021 1303 Gross per 24 hour  Intake 4166.79 ml  Output 2600 ml  Net 1566.79 ml    Net IO Since Admission: 11,911.09 mL [09-17-21 1343]  Hypercalcemia s/t Hyperparathyroidism 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 No significant sonographic abnormality of the thyroid. 2. 1.5 x 1.3 x 1.3 cm hypoechoic mass seen inferior and posterior to the inferior tip of the left thyroid lobe is suspicious for a parathyroid adenoma.  - Sensipar started, only 1 dose received due to lethargy, unable to give via NGT > Rx looking into alternative options for NGT administration - Would force diurese at this time with lasix dosing to account for IVF for mild positive balance - Nephrology following, appreciate input -Will volume reduce today, lasix eval output/renal indices  ENDO: Uncontrolled Type 2 Diabetes Mellitus Hemoglobin A1C: 8.2 - Monitor CBG Q 4 hours while NPO - SSI on hold while PO intake poor: CBG's 100-150 (12/12) - target range while in ICU: 140-180 - follow ICU hyper/hypo-glycemia protocol  Best Practice (right click and "Reselect all SmartList Selections" daily)  Diet/type: NPO w/ meds via tube DVT prophylaxis: LMWH GI prophylaxis: PPI Lines: N/A Foley:  N/A Code Status:  full code Last date of multidisciplinary goals of care discussion [09/11/2021]  Labs   CBC: Recent Labs  Lab 09/02/2021 0658 09/08/21 0616 09/08/2021 0259 09/10/21 0402 08/29/2021 0505  WBC 11.2* 12.4* 8.2 7.2 14.0*   NEUTROABS 10.6*  --   --   --   --   HGB 14.4 12.6* 11.9* 12.3* 10.5*  HCT 41.1 36.2* 34.9* 36.5* 32.1*  MCV 88.6 87.7 89.3 92.2 95.5  PLT 185 164 153 151 234     Basic Metabolic Panel: Recent Labs  Lab 09/10/21 0402 09/11/21 0341 09/11/21 1144 09/08/2021 0512 2021-09-17 0339  NA 137 139 142 143 141  K 3.6 2.8* 3.2* 3.6 4.0  CL 103 106 107 108 111  CO2 31 30 27 31 27   GLUCOSE  138* 210* 292* 203* 277*  BUN 19 22* 24* 28* 34*  CREATININE 0.82 0.76 0.78 0.81 1.06  CALCIUM 13.0* 11.2* 11.2* 10.1 9.5  MG 2.1 1.8 1.8 1.7 1.6*  PHOS 2.1* <1.0* <1.0*   <1.0* 1.7* 2.2*    GFR: Estimated Creatinine Clearance: 94.1 mL/min (by C-G formula based on SCr of 1.06 mg/dL). Recent Labs  Lab 09/19/2021 0658 09/01/2021 0912 09/03/2021 1206 09/14/2021 1444 09/08/21 0616 09/05/2021 0259 09/24/2021 1935 09/10/21 0402 09/14/2021 0505  PROCALCITON 15.80  --   --   --  8.58  --   --   --   --   WBC 11.2*  --   --   --  12.4* 8.2  --  7.2 14.0*  LATICACIDVEN 2.9* 2.9* 2.8* 2.8*  --   --  1.3  --   --      Liver Function Tests: Recent Labs  Lab 09/06/2021 0658 09/10/21 0402 09/11/21 1144  AST 33 39  --   ALT 23 19  --   ALKPHOS 94 78  --   BILITOT 3.1* 1.3*  --   PROT 6.4* 5.7*  --   ALBUMIN 2.6* 1.8* 2.5*    No results for input(s): LIPASE, AMYLASE in the last 168 hours. Recent Labs  Lab 09/10/21 0402  AMMONIA 14     ABG    Component Value Date/Time   PHART 7.45 09/04/2021 0500   PCO2ART 46 09/05/2021 0500   PO2ART 77 (L) 09/11/2021 0500   HCO3 32.0 (H) 09/17/2021 0500   O2SAT 95.8 09/05/2021 0500      Coagulation Profile: Recent Labs  Lab 09/06/2021 0659 09/23/2021 0259 09/11/2021 0512  INR 1.2 1.6* 1.6*     Cardiac Enzymes: No results for input(s): CKTOTAL, CKMB, CKMBINDEX, TROPONINI in the last 168 hours.  HbA1C: Hgb A1c MFr Bld  Date/Time Value Ref Range Status  09/06/2021 06:58 AM 8.2 (H) 4.8 - 5.6 % Final    Comment:    (NOTE)         Prediabetes: 5.7 - 6.4          Diabetes: >6.4         Glycemic control for adults with diabetes: <7.0   05/10/2021 05:00 AM 6.4 (H) 4.8 - 5.6 % Final    Comment:    (NOTE) Pre diabetes:          5.7%-6.4%  Diabetes:              >6.4%  Glycemic control for   <7.0% adults with diabetes     CBG: Recent Labs  Lab 09/05/2021 1942 09/23/2021 2306 2021/09/26 0330 09-26-2021 0721 09/26/21 1135  GLUCAP 156* 164* 207* 273* 385*     Review of Systems:   UTA- patient somnolent unable to participate in interview  Past Medical History:  He,  has a past medical history of Diabetes ([place]) and Hypertension.   Surgical History:   Past Surgical History:  Procedure Laterality Date   AMPUTATION Right 09/02/2021   Procedure: RIGHT OPEN GUILLOTINE BELOW KNEE AMPUTATION;  Surgeon: Algernon Huxley, MD;  Location: [facility] ORS;  Service: Vascular;  Laterality: Right;   AMPUTATION TOE Right 07/11/2020   Procedure: AMPUTATION TOE-Right Great Toe;  Surgeon: Samara Deist, DPM;  Location: [facility] OR[facility];  Service: Podiatry;  Laterality: Right;   APPLICATION OF WOUND VAC Right 09/23/2021   Procedure: APPLICATION OF WOUND VAC;  Surgeon: Algernon Huxley, MD;  Location: [facility] ORS;  Service: Vascular;  Laterality: Right;  I & D EXTREMITY Right 05/14/2021   Procedure: IRRIGATION AND DEBRIDEMENT EXTREMITY- RIGHT FOOT;  Surgeon: Samara Deist, DPM;  Location: [facility] ORS;  Service: Podiatry;  Laterality: Right;   INCISION AND DRAINAGE Right 09/26/2021   Procedure: INCISION AND DRAINAGE;  Surgeon: Algernon Huxley, MD;  Location: [facility] OR[facility];  Service: Vascular;  Laterality: Right;   IRRIGATION AND DEBRIDEMENT ABSCESS Right 10/31/2018   Procedure: IRRIGATION AND DEBRIDEMENT ABSCESS;  Surgeon: Samara Deist, DPM;  Location: [facility];  Service: Podiatry;  Laterality: Right;   IRRIGATION AND DEBRIDEMENT FOOT Right 11/02/2018   Procedure: IRRIGATION AND DEBRIDEMENT FOOT;  Surgeon: Sharlotte Alamo, DPM;  Location: [facility];  Service: Podiatry;  Laterality: Right;    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   Procedure: TRANSMETATARSAL AMPUTATION;  Surgeon: Samara Deist, DPM;  Location: [facility];  Service: Podiatry;  Laterality: Right;     Social History:   reports that he has been smoking cigars. He has never used smokeless tobacco. He reports current alcohol use. He reports that he does not use drugs.   Family History:  His family history includes CAD in his brother and father.   Allergies No Known Allergies   Home Medications  Prior to Admission medications   Medication Sig Start Date End Date Taking? Authorizing Provider  acetaminophen (TYLENOL) 325 MG tablet Take 2 tablets (650 mg total) by mouth every 6 (six) hours as needed for mild pain (or Fever >/= 101). 05/22/21  Yes Arrien, Jimmy Picket, MD  oxyCODONE (OXY IR/ROXICODONE) 5 MG immediate release tablet Take 1 tablet (5 mg total) by mouth every 6 (six) hours as needed for moderate pain. 05/22/21  Yes Arrien, Jimmy Picket, MD  amoxicillin-clavulanate (AUGMENTIN) 875-125 MG tablet Take 1 tablet by mouth 2 (two) times daily  for 14 days. Patient not taking: Reported on 09/08/2021 06/13/21   Tsosie Billing, MD  atorvastatin (LIPITOR) 40 MG tablet Take 40 mg by mouth daily.    [provider]  glipiZIDE (GLUCOTROL XL) 10 MG 24 hr tablet Take 20 mg by mouth daily with breakfast. Patient not taking: Reported on 08/29/2021    [provider]      //Maylene Crocker

## 2021-09-29 NOTE — Progress Notes (Signed)
Patient had an eventful day throughout the shift.  The patient was managed with vasopressors, sedatives, and continued on ventilation at the beginning of the shift.  Family came in and decided to move forward with having the patient placed on comfort care. Patient was given a bath, linens were changed, patient tolerated well.  Patient managed with ordered medications and treatment plan and once comfort orders officially placed patient was transitioned off of the ventilator by RT who also extubated the patient, the patient was also transitioned off of vasopressors and new ordered medications were started and stopped the discontinued medications.  Family remains at bedside in support of the patient, the chaplain also was at bedside during extubation of patient.  Patient did have increased agitation after extubation and NG tube removal, MD contacted and adjusted prn medications to support agonal breathing and secretion management.  Family remains at bedside.

## 2021-09-29 NOTE — Consult Note (Signed)
PHARMACY CONSULT NOTE - FOLLOW UP  Pharmacy Consult for Electrolyte Monitoring and Replacement   Recent Labs: Potassium (mmol/L)  Date Value  15-Sep-2021 4.0   Magnesium (mg/dL)  Date Value  09/15/21 1.6 (L)   Calcium (mg/dL)  Date Value  September 15, 2021 9.5   Albumin (g/dL)  Date Value  09/11/2021 2.5 (L)   Phosphorus (mg/dL)  Date Value  September 15, 2021 2.2 (L)   Sodium (mmol/L)  Date Value  09/15/21 141     Assessment: 56 year old male with PMH of HTN, DM, and HLD who was admitted to the hospital for right foot pain. Patient subsequently underwent right lower leg amputation.  On 12/14 patient was noted to be hyperkalemic with an undecidable phosphorous and pharmacy was consulted for electrolyte monitoring and replacement.   Patient has received 40mg  IV furosemide 12/13 and 12/14  Labs Na 143>141 K 3.6>4.0 Ca 10.1>9.5 Phos 1.7>2.2 Mg 1.7>1.6  Goal of Therapy:  Electrolytes within normal limits   Plan:  --Orders placed for 24mmol [place] and 2g Mg sulfate  --Will recheck phos 12/16 1500 --No other electrolyte replacement is currently indicated  --Will continue to monitor and replace as clinically indicated   Narda Rutherford, PharmD Pharmacy Resident  15-Sep-2021 7:27 AM

## 2021-09-29 NOTE — Progress Notes (Signed)
[place], [place] 2021-09-29  Subjective:   Hospital day # 6  Patient is currently being monitored in the ICU.  He underwent right below the knee amputation on December 12.   Irrigation and debridement of soft tissue, drainage of significant purulent material and placement of negative pressure dressing done on September 12, 2021.  Patient remains on ventilator. Sedated with fentanyl and propofol Pressor support with vasopressin and phenylephrine Tube feeds continued at 60 cc/h  Objective:  Vital signs in last 24 hours:  Temp:  [97.4 F (36.3 C)-103.1 F (39.5 C)] 97.4 F (36.3 C) (12/16 0800) Pulse Rate:  [73-92] 74 (12/16 0800) Resp:  [0-28] 0 (12/16 0800) BP: (90-114)/(51-77) 104/63 (12/16 0800) SpO2:  [91 %-100 %] 91 % (12/16 0800) Arterial Line BP: (78-123)/(48-72) 86/63 (12/16 0330) FiO2 (%):  [35 %-40 %] 35 % (12/16 0735)  Weight change:  Filed Weights   08/31/2021 0648 09/15/2021 1848  Weight: 93 kg 94.8 kg    Intake/Output:    Intake/Output Summary (Last 24 hours) at 29-Sep-2021 0940 Last data filed at Sep 29, 2021 6720 Gross per 24 hour  Intake 3281.22 ml  Output 2050 ml  Net 1231.22 ml      Physical Exam: General: Critically ill-appearing  HEENT NG tube in place  Pulm/lungs Ventilator assisted  CVS/Heart S1S2, no rubs or gallops  Abdomen:  Soft,   Extremities: Right leg with dressing.  Right BKA.  With wound VAC  Neurologic: Sedated          Basic Metabolic Panel:  Recent Labs  Lab 09/10/21 0402 09/11/21 0341 09/11/21 1144 09/18/2021 0512 09/29/21 0339  NA 137 139 142 143 141  K 3.6 2.8* 3.2* 3.6 4.0  CL 103 106 107 108 111  CO2 _0 GLUCOSE 138* 210* 292* 203* 277*  BUN 19 22* 24* 28* 34*  CREATININE 0.82 0.76 0.78 0.81 1.06  CALCIUM 13.0* 11.2* 11.2* 10.1 9.5  MG 2.1 1.8 1.8 1.7 1.6*  PHOS 2.1* <1.0* <1.0*   <1.0* 1.7* 2.2*      CBC: Recent Labs  Lab 09/28/2021 0658 09/08/21 0616  09/08/2021 0259 09/10/21 0402 09/01/2021 0505  WBC 11.2* 12.4* 8.2 7.2 14.0*  NEUTROABS 10.6*  --   --   --   --   HGB 14.4 12.6* 11.9* 12.3* 10.5*  HCT 41.1 36.2* 34.9* 36.5* 32.1*  MCV 88.6 87.7 89.3 92.2 95.5  PLT 185 164 153 151 234      No results found for: HEPBSAG, HEPBSAB, HEPBIGM    Microbiology:  Recent Results (from the past 240 hour(s))  Urine Culture     Status: None   Collection Time: 09/24/2021  6:48 AM   Specimen: Urine, Random  Result Value Ref Range Status   Specimen Description   Final    URINE, RANDOM Performed at [facility], [address] [place], [place] [address]    Special Requests   Final    NONE Performed at [facility], [address], [place], [place] [address]    Culture   Final    NO GROWTH Performed at [facility], [place] [address], [place], [place] [address]    Report Status 08/29/2021 FINAL  Final  Culture, blood (routine x 2)     Status: Abnormal   Collection Time: 09/03/2021  6:58 AM   Specimen: BLOOD  Result Value Ref Range Status   Specimen Description   Final    BLOOD LEFT  FA Performed at [facility], [address], [place], [place] [address]    Special Requests   Final    BOTTLES DRAWN AEROBIC AND ANAEROBIC Blood Culture adequate volume Performed at [facility], [address] [place], [place] [address]    Culture  Setup Time   Final    GRAM POSITIVE RODS ANAEROBIC BOTTLE ONLY CRITICAL RESULT CALLED TO, READ BACK BY AND VERIFIED WITH: NATHAN [email or web address]  09/10/21 RH Performed at [facility], [address] [place], [place] [address]    Culture (A)  Final    CLOSTRIDIUM SPECIES Standardized susceptibility testing for this organism is not available. Performed at [facility], [place] [address], [place], [place] [address]    Report Status 10/05/21 FINAL  Final  Culture, blood (routine x 2)     Status: None   Collection Time: 09/21/2021  6:59 AM   Specimen: BLOOD   Result Value Ref Range Status   Specimen Description BLOOD RIGHT FA  Final   Special Requests   Final    BOTTLES DRAWN AEROBIC AND ANAEROBIC Blood Culture adequate volume   Culture   Final    NO GROWTH 5 DAYS Performed at [facility], [address] [place], [place] [address]    Report Status 09/26/2021 FINAL  Final  Resp Panel by RT-PCR (Flu A&B, Covid) Nasopharyngeal Swab     Status: None   Collection Time: 09/08/2021  7:44 AM   Specimen: Nasopharyngeal Swab; Nasopharyngeal(NP) swabs in vial transport medium  Result Value Ref Range Status   SARS Coronavirus 2 by RT PCR NEGATIVE NEGATIVE Final    Comment: (NOTE) SARS-CoV-2 target nucleic acids are NOT DETECTED.  The SARS-CoV-2 RNA is generally detectable in upper respiratory specimens during the acute phase of infection. The lowest concentration of SARS-CoV-2 viral copies this assay can detect is 138 copies/mL. A negative result does not preclude SARS-Cov-2 infection and should not be used as the sole basis for treatment or other patient management decisions. A negative result may occur with  improper specimen collection/handling, submission of specimen other than nasopharyngeal swab, presence of viral mutation(s) within the areas targeted by this assay, and inadequate number of viral copies(<138 copies/mL). A negative result must be combined with clinical observations, patient history, and epidemiological information. The expected result is Negative.  Fact Sheet for Patients:  EntrepreneurPulse.com.au  Fact Sheet for Healthcare Providers:  IncredibleEmployment.be  This test is no t yet approved or cleared by the [place] FDA and  has been authorized for detection and/or diagnosis of SARS-CoV-2 by FDA under an Emergency Use Authorization (EUA). This EUA will remain  in effect (meaning this test can be used) for the duration of the COVID-19 declaration under Section 564(b)(1)  of the Act, 21 U.S.C.section 360bbb-3(b)(1), unless the authorization is terminated  or revoked sooner.       Influenza A by PCR NEGATIVE NEGATIVE Final   Influenza B by PCR NEGATIVE NEGATIVE Final    Comment: (NOTE) The Xpert Xpress SARS-CoV-2/FLU/RSV plus assay is intended as an aid in the diagnosis of influenza from Nasopharyngeal swab specimens and should not be used as a sole basis for treatment. Nasal washings and aspirates are unacceptable for Xpert Xpress SARS-CoV-2/FLU/RSV testing.  Fact Sheet for Patients: EntrepreneurPulse.com.au  Fact Sheet for Healthcare Providers: IncredibleEmployment.be  This test is not yet approved or cleared by the [place] FDA and has been authorized for detection and/or diagnosis of SARS-CoV-2 by FDA under an Emergency Use Authorization (  EUA). This EUA will remain in effect (meaning this test can be used) for the duration of the COVID-19 declaration under Section 564(b)(1) of the Act, 21 U.S.C. section 360bbb-3(b)(1), unless the authorization is terminated or revoked.  Performed at [facility], [address] [place], [place] [address]   Aerobic/Anaerobic Culture w Gram Stain (surgical/deep wound)     Status: None (Preliminary result)   Collection Time: 09/01/2021  8:16 AM   Specimen: Wound  Result Value Ref Range Status   Specimen Description   Final    WOUND Performed at [facility], [address] [place], [place] [address]    Special Requests RIGHT FOOT  Final   Gram Stain   Final    MODERATE WBC PRESENT, PREDOMINANTLY MONONUCLEAR MODERATE GRAM POSITIVE COCCI IN PAIRS AND CHAINS MODERATE GRAM NEGATIVE RODS    Culture   Final    RARE STAPHYLOCOCCUS AUREUS WITHIN MIXED ORGANISMS NO ANAEROBES ISOLATED; CULTURE IN PROGRESS FOR 5 DAYS CORRECTED ON 12/14 AT 1413: PREVIOUSLY REPORTED AS MIXED ANAEROBIC FLORA PRESENT.  CALL LAB IF FURTHER IID REQUIRED. CULTURE REINCUBATED  FOR BETTER GROWTH Performed at [facility], [place] [address] [place], [place] [address]    Report Status PENDING  Incomplete   Organism ID, Bacteria STAPHYLOCOCCUS AUREUS  Final      Susceptibility   Staphylococcus aureus - MIC*    CIPROFLOXACIN <=0.5 SENSITIVE Sensitive     ERYTHROMYCIN >=8 RESISTANT Resistant     GENTAMICIN <=0.5 SENSITIVE Sensitive     OXACILLIN 0.5 SENSITIVE Sensitive     TETRACYCLINE >=16 RESISTANT Resistant     VANCOMYCIN <=0.5 SENSITIVE Sensitive     TRIMETH/SULFA <=10 SENSITIVE Sensitive     CLINDAMYCIN <=0.25 SENSITIVE Sensitive     RIFAMPIN <=0.5 SENSITIVE Sensitive     Inducible Clindamycin NEGATIVE Sensitive     * RARE STAPHYLOCOCCUS AUREUS  MRSA Next Gen by PCR, Nasal     Status: None   Collection Time: 09/23/2021  6:58 PM   Specimen: Nasal Mucosa; Nasal Swab  Result Value Ref Range Status   MRSA by PCR Next Gen NOT DETECTED NOT DETECTED Final    Comment: (NOTE) The GeneXpert MRSA Assay (FDA approved for NASAL specimens only), is one component of a comprehensive MRSA colonization surveillance program. It is not intended to diagnose MRSA infection nor to guide or monitor treatment for MRSA infections. Test performance is not FDA approved in patients less than 51 years old. Performed at [facility], [address] [place], [place] [address]     Coagulation Studies: Recent Labs    09/22/2021 0512  LABPROT 19.1*  INR 1.6*     Urinalysis: No results for input(s): COLORURINE, LABSPEC, PHURINE, GLUCOSEU, HGBUR, BILIRUBINUR, KETONESUR, PROTEINUR, UROBILINOGEN, NITRITE, LEUKOCYTESUR in the last 72 hours.  Invalid input(s): APPERANCEUR     Imaging: DG Chest 1 View  Result Date: 09/05/2021 CLINICAL DATA:  Status post cardiac arrest.  Intubation film. EXAM: CHEST  1 VIEW COMPARISON:  Portable chest 09/10/2021. FINDINGS: 4:16 a.m., 09/28/2021. The patient is now intubated. The tip tube is 6.3 cm from the carina. NGT enters the  stomach with the intragastric course not seen. The heart is enlarged. There are increasing septal lines from the level of the aortic arch down consistent with development of moderate interstitial edema. There are worsening perihilar alveolar opacities most likely due to alveolar edema, underlying pneumonia also a possibility. There is increased vascular engorgement centrally, with small pleural effusions beginning to form.  IMPRESSION: 1. Cardiomegaly with CHF and worsening moderate interstitial edema. 2. Perihilar opacities most likely alveolar edema, pneumonia not strictly excluded. 3. Small pleural effusions. Electronically Signed   By: Telford Nab M.D.   On: 09/16/2021 04:26   [place] EKG SITE RITE  Result Date: 09/11/2021 If Site Rite image not attached, placement could not be confirmed due to current cardiac rhythm.    Medications:    sodium chloride     dexmedetomidine (PRECEDEX) IV infusion Stopped (09/27/2021 2055)   feeding supplement (VITAL 1.5 CAL) 1,000 mL (09/18/2021 1653)   fentaNYL infusion INTRAVENOUS 125 mcg/hr (09/20/21 7253)   lactated ringers 75 mL/hr at 2021-09-20 0245   magnesium sulfate bolus IVPB 2 g (09-20-2021 0850)   phenylephrine (NEO-SYNEPHRINE) Adult infusion 100 mcg/min (September 20, 2021 0745)   piperacillin-tazobactam (ZOSYN)  IV 12.5 mL/hr at Sep 20, 2021 0633   propofol (DIPRIVAN) infusion 45 mcg/kg/min (09-20-2021 0640)   sodium phosphate  Dextrose 5% IVPB     thiamine injection Stopped (09/23/2021 1027)   vasopressin 0.03 Units/min (09-20-2021 6644)    aspirin  81 mg Per Tube Daily   atorvastatin  40 mg Per Tube QPM   chlorhexidine gluconate (MEDLINE KIT)  15 mL Mouth Rinse BID   Chlorhexidine Gluconate Cloth  6 each Topical Daily   cinacalcet  30 mg Oral Q supper   diazepam  5 mg Per NG tube Q8H   docusate  100 mg Per Tube BID   enoxaparin (LOVENOX) injection  40 mg Subcutaneous Q24H   feeding supplement (PROSource TF)  45 mL Per Tube TID   folic acid  1 mg Per Tube Daily    free water  30 mL Per Tube Q4H   hydrocortisone sod succinate (SOLU-CORTEF) inj  100 mg Intravenous Q8H   insulin aspart  0-15 Units Subcutaneous Q4H   insulin aspart  2 Units Subcutaneous Q4H   mouth rinse  15 mL Mouth Rinse 10 times per day   metoprolol tartrate  25 mg Per Tube BID   multivitamin with minerals  1 tablet Per Tube Daily   nutrition supplement (JUVEN)  1 packet Per Tube BID BM   pantoprazole (PROTONIX) IV  40 mg Intravenous Q24H   polyethylene glycol  17 g Per Tube Daily   sodium chloride flush  10-40 mL Intracatheter Q12H   spironolactone  25 mg Per Tube Daily   [START ON 09/18/2021] thiamine injection  100 mg Intravenous Daily   acetaminophen, acetaminophen, fentaNYL, midazolam, midazolam, morphine injection, ondansetron **OR** ondansetron (ZOFRAN) IV, oxyCODONE, sodium chloride flush  Assessment/ Plan:  56 y.o. 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 Lab Results  Component Value Date   PTH 79 (H) 09/03/2021   CALCIUM 9.5 September 20, 2021   PHOS 2.2 (L) 2021-09-20    Elevated PTH CXR - neg for findings of sarcoid Thyroid studies normal in 06/2020 SPEP neg for myeloma in 06/2020 Primary Hyperparathyroidism, Neck ultrasound shows 1.5 cm left parathyroid adenoma. Continue with Sensipar.  Follow-up with endocrinology as outpatient. 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  #Hypokalemia Lab Results  Component Value Date   K 4.0 09/17/2021     Continue small dose of spironolactone. monitor magnesium and replace as necessary  #Hypophosphatemia Should improve with tube feeds Agree with IV replacement    LOS: [place] 2022-12-209:40 AM  [place], [place]  Note: This note was prepared with Dragon dictation. Any transcription errors are unintentional

## 2021-09-29 NOTE — Progress Notes (Signed)
PCCM PROGRESS   Discussed with the patient's HCPOA/sister After prior conversations with palliative care (please see notes) Family wishes that [name] be extubated to comfort measures only Full DNR  //Jadon Ressler

## 2021-09-29 NOTE — Plan of Care (Signed)
Continuing with plan of care. 

## 2021-09-29 NOTE — Progress Notes (Addendum)
Daily Progress Note   Patient Name: [name]       Date: 03-Oct-2021 DOB: 15-Jan-1966  Age: 56 y.o. MRN#: [id number] Attending Physician: Flora Lipps, MD Primary Care Physician: Pcp, No Admit Date: 09/26/2021  Reason for Consultation/Follow-up: Establishing goals of care  Subjective: Patient is on ventilator. Propofol was initiated last night due to agitation. A second pressor was initiated.   Sister [name] and her husband [name] are at bedside. [name] tells me she is at peace with letting him go. She does not want him to suffer. She states his friend is coming to see him and then they will be ready to stop life prolonging care, shift to comfort care, and let him die with dignity. Nursing joined conversation. [name] discusses that the friend does not agree with her decision. She was assured she would be supported in her decisions in any way necessary. Questions answered.    Length of Stay: 6  Current Medications: Scheduled Meds:   aspirin  81 mg Per Tube Daily   atorvastatin  40 mg Per Tube QPM   chlorhexidine gluconate (MEDLINE KIT)  15 mL Mouth Rinse BID   Chlorhexidine Gluconate Cloth  6 each Topical Daily   cinacalcet  30 mg Oral Q supper   diazepam  5 mg Per NG tube Q8H   docusate  100 mg Per Tube BID   enoxaparin (LOVENOX) injection  40 mg Subcutaneous Q24H   feeding supplement (PROSource TF)  45 mL Per Tube TID   folic acid  1 mg Per Tube Daily   free water  30 mL Per Tube Q4H   hydrocortisone sod succinate (SOLU-CORTEF) inj  100 mg Intravenous Q8H   insulin aspart  0-15 Units Subcutaneous Q4H   insulin aspart  2 Units Subcutaneous Q4H   mouth rinse  15 mL Mouth Rinse 10 times per day   metoprolol tartrate  25 mg Per Tube BID   multivitamin with minerals  1 tablet Per Tube  Daily   nutrition supplement (JUVEN)  1 packet Per Tube BID BM   pantoprazole (PROTONIX) IV  40 mg Intravenous Q24H   polyethylene glycol  17 g Per Tube Daily   sodium chloride flush  10-40 mL Intracatheter Q12H   spironolactone  25 mg Per Tube Daily   [START ON 09/18/2021] thiamine injection  100 mg Intravenous Daily    Continuous Infusions:  sodium chloride     dexmedetomidine (PRECEDEX) IV infusion Stopped (09/27/2021 2055)   feeding supplement (VITAL 1.5 CAL) 1,000 mL (09/16/2021 1653)   fentaNYL infusion INTRAVENOUS 125 mcg/hr (2021/09/16 1302)   phenylephrine (NEO-SYNEPHRINE) Adult infusion 110 mcg/min (09-16-21 1302)   piperacillin-tazobactam (ZOSYN)  IV 12.5 mL/hr at 09/16/2021 1302   propofol (DIPRIVAN) infusion 25 mcg/kg/min (09-16-2021 1304)   sodium phosphate  Dextrose 5% IVPB 43 mL/hr at 2021/09/16 1302   thiamine injection Stopped (2021/09/16 1102)   vasopressin 0.03 Units/min (09-16-2021 1302)    PRN Meds: acetaminophen, acetaminophen, fentaNYL, midazolam, midazolam, morphine injection, ondansetron **OR** ondansetron (ZOFRAN) IV, oxyCODONE, sodium chloride flush  Physical Exam Constitutional:      Comments: Eyes closed. On ventilator.            Vital Signs: BP (!) 106/59    Pulse 73    Temp (!) 97.5 F (36.4 C) (Axillary)    Resp (!) 0    Ht '6\' 3"'  (1.905 m)    Wt 94.8 kg    SpO2 100%    BMI 26.12 kg/m  SpO2: SpO2: 100 % O2 Device: O2 Device: Ventilator O2 Flow Rate: O2 Flow Rate (L/min): 2 L/min  Intake/output summary:  Intake/Output Summary (Last 24 hours) at 2021-09-16 1306 Last data filed at 09-16-2021 1303 Gross per 24 hour  Intake 4166.79 ml  Output 2600 ml  Net 1566.79 ml   LBM: Last BM Date: 09/10/21 Baseline Weight: Weight: 93 kg Most recent weight: Weight: 94.8 kg     Patient Active Problem List   Diagnosis Date Noted   Malnutrition of moderate degree 09/11/2021   HFrEF (heart failure with reduced ejection fraction) (HCC)    NSTEMI (non-ST elevated  myocardial infarction) ([place]) 08/31/2021   Nicotine dependence 09/23/2021   Alcohol dependence ([place]) 09/25/2021   Diabetic foot ulcer ([place]) 09/17/2021   Hyponatremia 09/08/2021   Sepsis ([place]) 05/22/2021   Foot osteomyelitis, right ([place]) 05/09/2021   Hypercalcemia 07/10/2020   Diabetes mellitus with hyperglycemia ([place]) 07/09/2020   HTN (hypertension) 07/09/2020   Osteomyelitis of great toe of right foot ([place]) 10/30/2018   Dog bite 04/13/2018    Palliative Care Assessment & Plan    Recommendations/Plan: Sister is present and advises his friend needs to come and see him, and then she will be ready for medical team to shift to comfort care.  Anticipate hospital death.   Code Status:    Code Status Orders  (From admission, onward)           Start     Ordered   09/24/2021 1531  Limited resuscitation (code)  Continuous       Question Answer Comment  In the event of cardiac or respiratory ARREST: Initiate Code Blue, Call Rapid Response No   In the event of cardiac or respiratory ARREST: Perform CPR No   In the event of cardiac or respiratory ARREST: Perform Intubation/Mechanical Ventilation Yes   In the event of cardiac or respiratory ARREST: Use NIPPV/BiPAp only if indicated No   In the event of cardiac or respiratory ARREST: Administer ACLS medications if indicated No   In the event of cardiac or respiratory ARREST: Perform Defibrillation or Cardioversion if indicated No   Comments Continue current care and current intubation. No escalation in care. Withdrawl of care tomorrow when sister comes.      09/25/2021 1531           Code Status History  Date Active Date Inactive Code Status Order ID Comments User Context   09/21/2021 0932 08/31/2021 1531 Full Code [id number]  Collier Bullock, MD ED   05/09/2021 1954 05/22/2021 1725 Full Code [id number]  Mansy, Arvella Merles, MD ED   07/09/2020 2126 07/13/2020 2130 Full Code [id number]  Orene Desanctis, DO ED   10/30/2018 1526 11/04/2018 2211 Full  Code [id number]  Fritzi Mandes, MD Inpatient   04/13/2018 1608 04/14/2018 1527 Full Code [id number]  Hillary Bow, MD ED       Prognosis:  Hours - Days   Care plan was discussed with RN  Thank you for allowing the Palliative Medicine Team to assist in the care of this patient.       Total Time 35 min Prolonged Time Billed  no       Greater than 50%  of this time was spent counseling and coordinating care related to the above assessment and plan.  Asencion Gowda, NP  Please contact Palliative Medicine Team phone at [phone] for questions and concerns.

## 2021-09-29 DEATH — deceased

## 2021-10-31 IMAGING — MR MR FOOT*R* WO/W CM
9 series · 40 of 40 positions shown · IV contrast (gadavist)
Comparison: Plain films right great toe 07/05/2020.

CLINICAL DATA: Skin ulceration on the plantar surface of the right
great toe in a diabetic patient. Question osteomyelitis.

EXAM:
MRI OF THE RIGHT FOREFOOT WITHOUT AND WITH CONTRAST
TECHNIQUE: Multiplanar, multisequence MR imaging of the right forefoot was
performed before and after the administration of intravenous
contrast.
CONTRAST:  10 mL GADAVIST IV SOLN

[Series 5: T1 · coronal · right · 3.0mm · 0.38mm/px · 7 of 49 slices shown (1 of 2)]
[im 1/49]
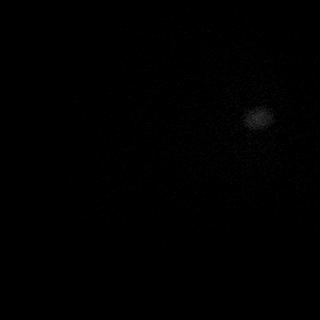
[im 9/49]
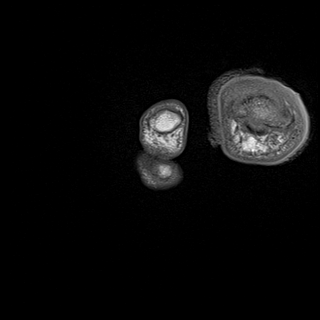
[im 17/49]
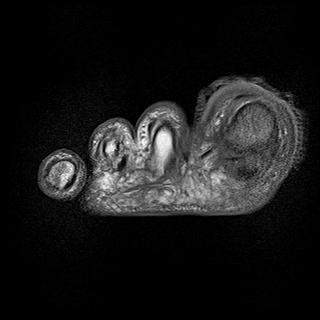
[im 25/49]
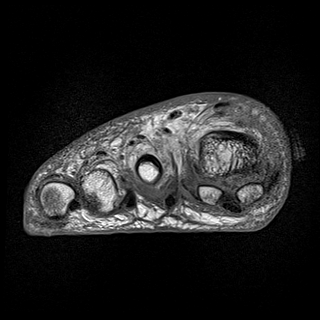
[im 33/49]
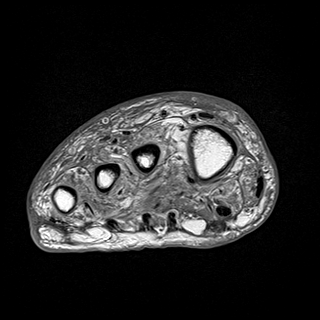
[im 41/49]
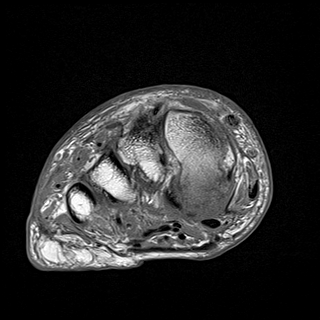
[im 49/49]
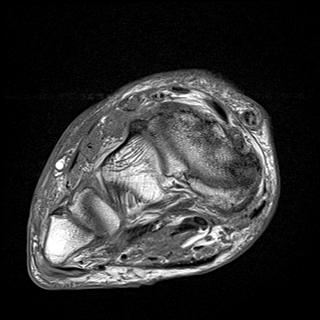

[Series 7: T2 · coronal · right · 3.0mm · 0.50mm/px · 7 of 49 slices shown (1 of 2)]
[im 1/49]
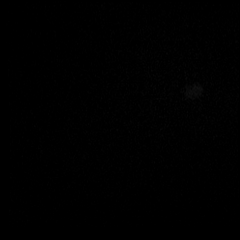
[im 9/49]
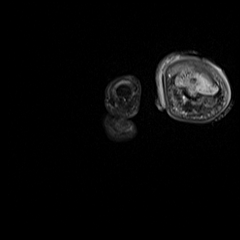
[im 17/49]
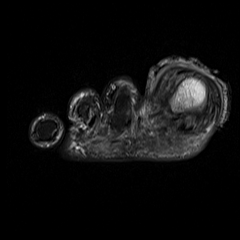
[im 25/49]
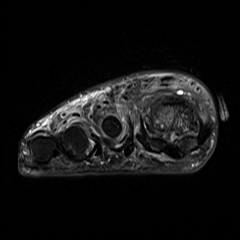
[im 33/49]
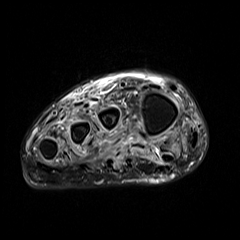
[im 41/49]
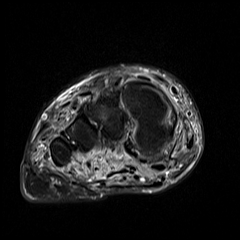
[im 49/49]
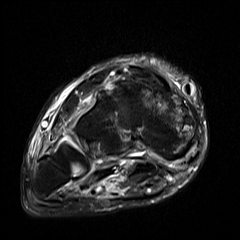

[Series 8: T1 · axial · right · 3.0mm · 0.70mm/px · z∈[-100,-27]mm · 2 of 20 slices shown (2 of 2)]
[im 1/20]
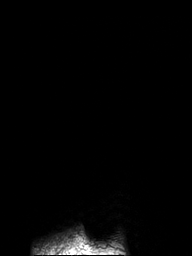
[im 20/20]
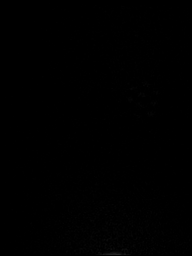

[Series 10: T2 · axial · right · 3.0mm · 0.70mm/px · z∈[-100,-27]mm · 2 of 20 slices shown (2 of 2)]
[im 1/20]
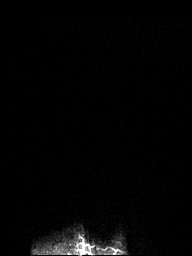
[im 20/20]
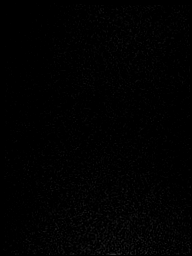

[Series 11: STIR · sagittal · right · 3.0mm · 0.62mm/px · 4 of 34 slices shown]
[im 1/34]
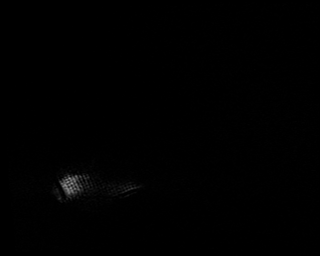
[im 12/34]
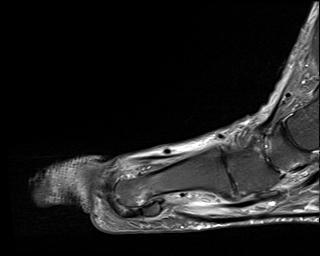
[im 23/34]
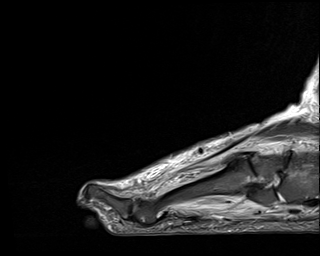
[im 34/34]
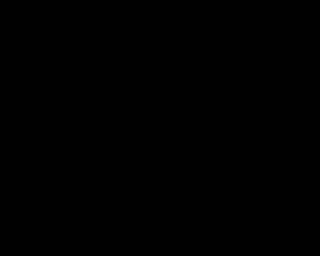

[Series 12: T1 fat-sat · coronal · non-contrast · right · 3.0mm · 0.47mm/px · 6 of 49 slices shown (1 of 3)]
[im 1/49]
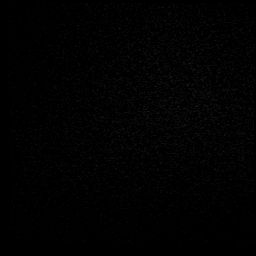
[im 10/49]
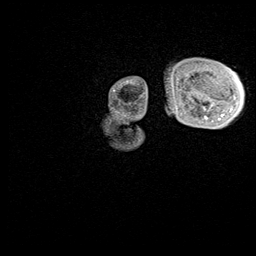
[im 20/49]
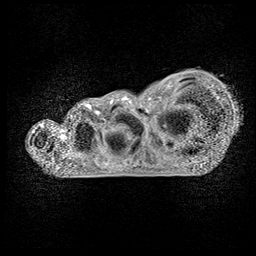
[im 29/49]
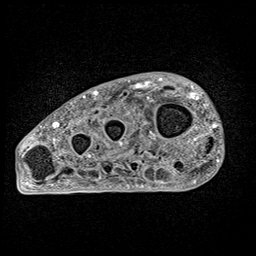
[im 39/49]
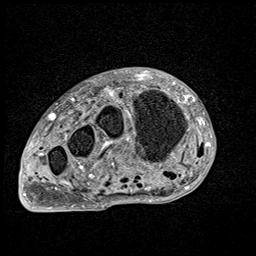
[im 49/49]
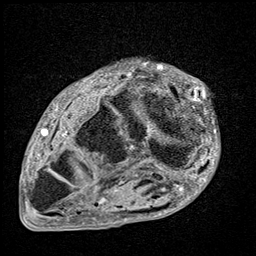

[Series 13: T1 fat-sat post-contrast · coronal · right · 3.0mm · 0.47mm/px · 6 of 49 slices shown]
[im 1/49]
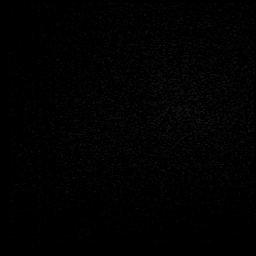
[im 10/49]
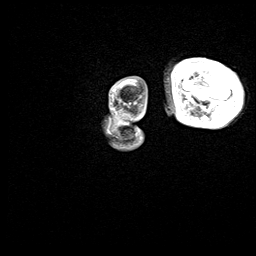
[im 20/49]
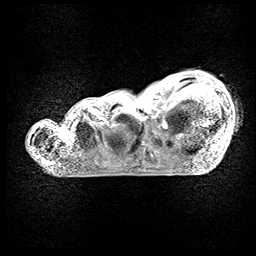
[im 29/49]
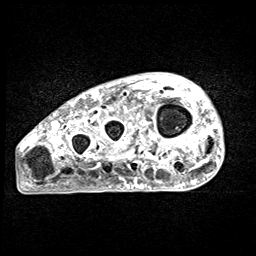
[im 39/49]
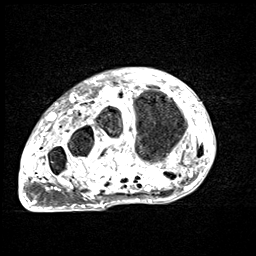
[im 49/49]
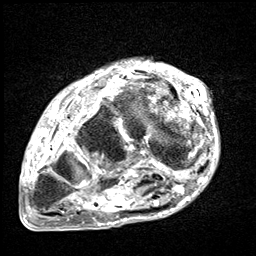

[Series 14: T1 fat-sat · sagittal · right · 3.0mm · 0.62mm/px · 4 of 35 slices shown (2 of 3)]
[im 1/35]
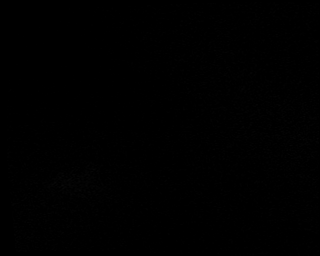
[im 12/35]
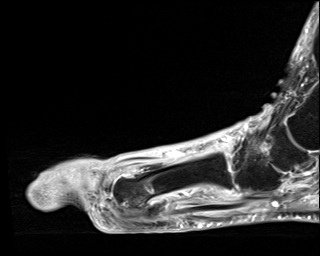
[im 23/35]
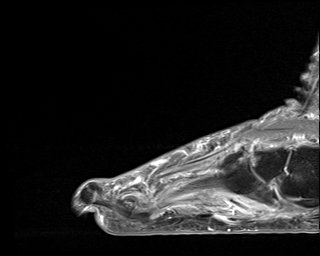
[im 35/35]
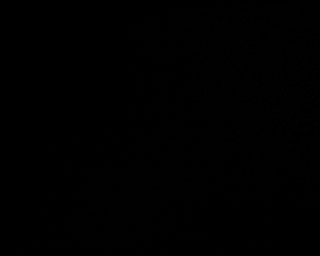

[Series 15: T1 fat-sat · axial · right · 3.0mm · 0.56mm/px · z∈[-100,-27]mm · 2 of 20 slices shown (3 of 3)]
[im 1/20]
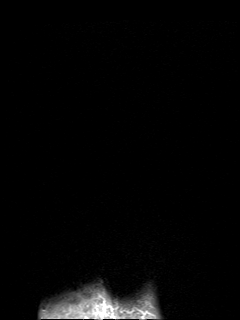
[im 20/20]
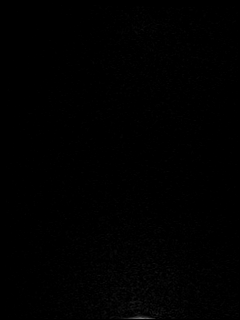

[40 of 40 positions shown; findings below may reference images not displayed]

FINDINGS: Bones/Joint/Cartilage

There is intense marrow edema and enhancement throughout the great
toe consistent with osteomyelitis. Bony destructive change is seen
about the IP joint and there is a small IP joint effusion. No other
evidence of osteomyelitis is identified. No other evidence of
osteomyelitis or septic joint is identified. The head of the
proximal phalanx of the great toe appears exposed to air. The
patient has moderate to moderately severe first MTP osteoarthritis.
There is also midfoot osteoarthritis which appears mild-to-moderate
in degree and worst at the second tarsometatarsal joint.

Ligaments

Intact.

Muscles and Tendons

There is atrophy of intrinsic musculature the foot. No intramuscular
fluid collection. No tendon tear.

Soft tissues

Bandaging is present about the great toe. There is soft tissue edema
and enhancement in the great toe consistent with cellulitis.
IMPRESSION: Osteomyelitis throughout the great toe with bony destructive change
about the IP joint compatible with septic joint. Negative for
myositis or abscess.

First MTP and midfoot osteoarthritis.
